# Patient Record
Sex: Female | Born: 1985
Health system: Southern US, Community
[De-identification: ages and names within clinical notes are randomized; demographics above are authoritative.]

## PROBLEM LIST (undated history)

## (undated) DIAGNOSIS — F32A Depression, unspecified: Secondary | ICD-10-CM

## (undated) DIAGNOSIS — J45909 Unspecified asthma, uncomplicated: Secondary | ICD-10-CM

## (undated) DIAGNOSIS — F4310 Post-traumatic stress disorder, unspecified: Secondary | ICD-10-CM

## (undated) DIAGNOSIS — L509 Urticaria, unspecified: Secondary | ICD-10-CM

## (undated) DIAGNOSIS — Z87891 Personal history of nicotine dependence: Secondary | ICD-10-CM

## (undated) DIAGNOSIS — L309 Dermatitis, unspecified: Secondary | ICD-10-CM

## (undated) DIAGNOSIS — F3181 Bipolar II disorder: Secondary | ICD-10-CM

## (undated) DIAGNOSIS — E669 Obesity, unspecified: Secondary | ICD-10-CM

## (undated) DIAGNOSIS — F419 Anxiety disorder, unspecified: Secondary | ICD-10-CM

## (undated) DIAGNOSIS — T7840XA Allergy, unspecified, initial encounter: Secondary | ICD-10-CM

## (undated) DIAGNOSIS — F329 Major depressive disorder, single episode, unspecified: Secondary | ICD-10-CM

## (undated) DIAGNOSIS — F603 Borderline personality disorder: Secondary | ICD-10-CM

## (undated) HISTORY — DX: Dermatitis, unspecified: L30.9

## (undated) HISTORY — DX: Unspecified asthma, uncomplicated: J45.909

## (undated) HISTORY — PX: CYSTECTOMY: SHX5119

## (undated) HISTORY — DX: Borderline personality disorder: F60.3

## (undated) HISTORY — PX: OTHER SURGICAL HISTORY: SHX169

## (undated) HISTORY — DX: Personal history of nicotine dependence: Z87.891

## (undated) HISTORY — DX: Allergy, unspecified, initial encounter: T78.40XA

## (undated) HISTORY — DX: Urticaria, unspecified: L50.9

---
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
## 2007-01-03 ENCOUNTER — Ambulatory Visit (HOSPITAL_COMMUNITY): Payer: Self-pay | Admitting: Psychiatry

## 2007-01-06 ENCOUNTER — Emergency Department (HOSPITAL_COMMUNITY): Admission: EM | Admit: 2007-01-06 | Discharge: 2007-01-06 | Payer: Self-pay | Admitting: Emergency Medicine

## 2007-02-07 ENCOUNTER — Ambulatory Visit (HOSPITAL_COMMUNITY): Payer: Self-pay | Admitting: Psychiatry

## 2007-04-27 ENCOUNTER — Other Ambulatory Visit: Admission: RE | Admit: 2007-04-27 | Discharge: 2007-04-27 | Payer: Self-pay | Admitting: Family Medicine

## 2007-06-17 ENCOUNTER — Ambulatory Visit: Payer: Self-pay | Admitting: Psychiatry

## 2007-06-17 ENCOUNTER — Inpatient Hospital Stay (HOSPITAL_COMMUNITY): Admission: RE | Admit: 2007-06-17 | Discharge: 2007-06-23 | Payer: Self-pay | Admitting: Psychiatry

## 2007-08-05 ENCOUNTER — Encounter: Payer: Self-pay | Admitting: Emergency Medicine

## 2007-08-05 ENCOUNTER — Inpatient Hospital Stay (HOSPITAL_COMMUNITY): Admission: EM | Admit: 2007-08-05 | Discharge: 2007-08-10 | Payer: Self-pay | Admitting: Internal Medicine

## 2007-08-10 ENCOUNTER — Inpatient Hospital Stay (HOSPITAL_COMMUNITY): Admission: RE | Admit: 2007-08-10 | Discharge: 2007-08-15 | Payer: Self-pay | Admitting: Psychiatry

## 2007-08-10 ENCOUNTER — Ambulatory Visit: Payer: Self-pay | Admitting: Psychiatry

## 2008-08-20 ENCOUNTER — Other Ambulatory Visit: Admission: RE | Admit: 2008-08-20 | Discharge: 2008-08-20 | Payer: Self-pay | Admitting: Family Medicine

## 2008-11-18 ENCOUNTER — Inpatient Hospital Stay (HOSPITAL_COMMUNITY): Admission: EM | Admit: 2008-11-18 | Discharge: 2008-11-20 | Payer: Self-pay | Admitting: Emergency Medicine

## 2008-12-31 ENCOUNTER — Emergency Department (HOSPITAL_COMMUNITY): Admission: EM | Admit: 2008-12-31 | Discharge: 2008-12-31 | Payer: Self-pay | Admitting: Emergency Medicine

## 2009-01-01 ENCOUNTER — Emergency Department (HOSPITAL_COMMUNITY): Admission: EM | Admit: 2009-01-01 | Discharge: 2009-01-01 | Payer: Self-pay | Admitting: Emergency Medicine

## 2009-01-21 ENCOUNTER — Emergency Department (HOSPITAL_COMMUNITY): Admission: EM | Admit: 2009-01-21 | Discharge: 2009-01-22 | Payer: Self-pay | Admitting: Emergency Medicine

## 2009-01-22 ENCOUNTER — Ambulatory Visit: Payer: Self-pay | Admitting: Psychiatry

## 2009-01-22 ENCOUNTER — Inpatient Hospital Stay (HOSPITAL_COMMUNITY): Admission: RE | Admit: 2009-01-22 | Discharge: 2009-01-28 | Payer: Self-pay | Admitting: Psychiatry

## 2009-02-24 ENCOUNTER — Emergency Department (HOSPITAL_COMMUNITY): Admission: EM | Admit: 2009-02-24 | Discharge: 2009-02-24 | Payer: Self-pay | Admitting: Emergency Medicine

## 2009-04-19 ENCOUNTER — Emergency Department (HOSPITAL_COMMUNITY): Admission: EM | Admit: 2009-04-19 | Discharge: 2009-04-19 | Payer: Self-pay | Admitting: Emergency Medicine

## 2009-11-20 ENCOUNTER — Emergency Department (HOSPITAL_BASED_OUTPATIENT_CLINIC_OR_DEPARTMENT_OTHER)
Admission: EM | Admit: 2009-11-20 | Discharge: 2009-11-20 | Payer: Self-pay | Source: Home / Self Care | Admitting: Emergency Medicine

## 2010-04-16 LAB — ETHANOL: Alcohol, Ethyl (B): <5 mg/dL (ref 0–10)

## 2010-04-16 LAB — POCT TOXICOLOGY PANEL

## 2010-04-16 LAB — URINALYSIS, ROUTINE W REFLEX MICROSCOPIC
Bilirubin Urine: NEGATIVE
Glucose, UA: NEGATIVE mg/dL
Hgb urine dipstick: NEGATIVE
Ketones, ur: 15 mg/dL — AB

## 2010-04-20 LAB — POCT I-STAT, CHEM 8
BUN: 8 mg/dL (ref 6–23)
Chloride: 108 mEq/L (ref 96–112)
Creatinine, Ser: 0.8 mg/dL (ref 0.4–1.2)
Potassium: 3.8 mEq/L (ref 3.5–5.1)
Sodium: 138 mEq/L (ref 135–145)

## 2010-04-20 LAB — PREGNANCY, URINE: Preg Test, Ur: NEGATIVE

## 2010-04-28 LAB — URINALYSIS, ROUTINE W REFLEX MICROSCOPIC
Glucose, UA: NEGATIVE mg/dL
Hgb urine dipstick: NEGATIVE
Ketones, ur: 15 mg/dL — AB
Protein, ur: 30 mg/dL — AB
Urobilinogen, UA: 1 mg/dL (ref 0.0–1.0)

## 2010-04-28 LAB — RAPID URINE DRUG SCREEN, HOSP PERFORMED
Cocaine: NOT DETECTED
Opiates: NOT DETECTED

## 2010-04-28 LAB — DIFFERENTIAL
Basophils Absolute: 0.1 10*3/uL (ref 0.0–0.1)
Basophils Relative: 1 % (ref 0–1)
Eosinophils Absolute: 0.2 10*3/uL (ref 0.0–0.7)
Neutro Abs: 5.1 10*3/uL (ref 1.7–7.7)
Neutrophils Relative %: 62 % (ref 43–77)

## 2010-04-28 LAB — URINE CULTURE: Colony Count: 5000

## 2010-04-28 LAB — BASIC METABOLIC PANEL
BUN: 5 mg/dL — ABNORMAL LOW (ref 6–23)
Chloride: 107 mEq/L (ref 96–112)
GFR calc Af Amer: >60 mL/min (ref >60)
GFR calc non Af Amer: >60 mL/min (ref >60)
Potassium: 3 mEq/L — ABNORMAL LOW (ref 3.5–5.1)
Sodium: 138 mEq/L (ref 135–145)

## 2010-04-28 LAB — URINE MICROSCOPIC-ADD ON

## 2010-04-28 LAB — CBC
MCHC: 33.4 g/dL (ref 30.0–36.0)
MCV: 89.7 fL (ref 78.0–100.0)
RDW: 12 % (ref 11.5–15.5)

## 2010-05-05 LAB — CBC
HCT: 43.7 % (ref 36.0–46.0)
Hemoglobin: 14.9 g/dL (ref 12.0–15.0)
MCHC: 34.1 g/dL (ref 30.0–36.0)
RBC: 4.82 MIL/uL (ref 3.87–5.11)

## 2010-05-05 LAB — BASIC METABOLIC PANEL
CO2: 22 mEq/L (ref 19–32)
Calcium: 9.4 mg/dL (ref 8.4–10.5)
GFR calc Af Amer: >60 mL/min (ref >60)
Glucose, Bld: 93 mg/dL (ref 70–99)
Potassium: 3.7 mEq/L (ref 3.5–5.1)
Sodium: 139 mEq/L (ref 135–145)

## 2010-05-05 LAB — URINE CULTURE
Colony Count: NO GROWTH
Special Requests: NEGATIVE

## 2010-05-05 LAB — URINE MICROSCOPIC-ADD ON

## 2010-05-05 LAB — URINALYSIS, ROUTINE W REFLEX MICROSCOPIC
Ketones, ur: >80 mg/dL — AB
Nitrite: NEGATIVE
Protein, ur: NEGATIVE mg/dL
Urobilinogen, UA: 1 mg/dL (ref 0.0–1.0)

## 2010-05-05 LAB — RAPID URINE DRUG SCREEN, HOSP PERFORMED
Amphetamines: NOT DETECTED
Barbiturates: NOT DETECTED
Benzodiazepines: POSITIVE — AB
Tetrahydrocannabinol: POSITIVE — AB

## 2010-05-05 LAB — DIFFERENTIAL
Basophils Relative: 1 % (ref 0–1)
Eosinophils Relative: 1 % (ref 0–5)
Lymphocytes Relative: 25 % (ref 12–46)
Monocytes Absolute: 0.4 10*3/uL (ref 0.1–1.0)
Monocytes Relative: 4 % (ref 3–12)
Neutro Abs: 7 10*3/uL (ref 1.7–7.7)

## 2010-05-05 LAB — TRICYCLICS SCREEN, URINE: TCA Scrn: NOT DETECTED

## 2010-05-07 LAB — CBC
HCT: 42 % (ref 36.0–46.0)
Hemoglobin: 14.5 g/dL (ref 12.0–15.0)
MCHC: 34.5 g/dL (ref 30.0–36.0)
RDW: 12.1 % (ref 11.5–15.5)

## 2010-05-07 LAB — BASIC METABOLIC PANEL
BUN: 5 mg/dL — ABNORMAL LOW (ref 6–23)
CO2: 22 mEq/L (ref 19–32)
Glucose, Bld: 110 mg/dL — ABNORMAL HIGH (ref 70–99)
Potassium: 3.1 mEq/L — ABNORMAL LOW (ref 3.5–5.1)
Sodium: 140 mEq/L (ref 135–145)

## 2010-05-07 LAB — DIFFERENTIAL
Basophils Absolute: 0.1 10*3/uL (ref 0.0–0.1)
Basophils Relative: 1 % (ref 0–1)
Eosinophils Absolute: 0.4 10*3/uL (ref 0.0–0.7)
Eosinophils Relative: 4 % (ref 0–5)
Monocytes Absolute: 0.6 10*3/uL (ref 0.1–1.0)

## 2010-05-08 LAB — DIFFERENTIAL
Basophils Absolute: 0.1 10*3/uL (ref 0.0–0.1)
Eosinophils Relative: 2 % (ref 0–5)
Lymphocytes Relative: 12 % (ref 12–46)
Neutro Abs: 10.4 10*3/uL — ABNORMAL HIGH (ref 1.7–7.7)
Neutrophils Relative %: 82 % — ABNORMAL HIGH (ref 43–77)

## 2010-05-08 LAB — POCT I-STAT, CHEM 8
Calcium, Ion: 1.04 mmol/L — ABNORMAL LOW (ref 1.12–1.32)
Chloride: 110 mEq/L (ref 96–112)
Creatinine, Ser: 0.6 mg/dL (ref 0.4–1.2)
Glucose, Bld: 138 mg/dL — ABNORMAL HIGH (ref 70–99)
HCT: 41 % (ref 36.0–46.0)
Hemoglobin: 13.9 g/dL (ref 12.0–15.0)
Potassium: 3.7 mEq/L (ref 3.5–5.1)
Sodium: 141 mEq/L (ref 135–145)

## 2010-05-08 LAB — CBC
Platelets: 213 10*3/uL (ref 150–400)
RDW: 12.1 % (ref 11.5–15.5)

## 2010-05-08 LAB — URINALYSIS, ROUTINE W REFLEX MICROSCOPIC
Bilirubin Urine: NEGATIVE
Hgb urine dipstick: NEGATIVE
Ketones, ur: NEGATIVE mg/dL
Nitrite: NEGATIVE
Specific Gravity, Urine: 1.018 (ref 1.005–1.030)
pH: 7 (ref 5.0–8.0)

## 2010-05-08 LAB — RAPID URINE DRUG SCREEN, HOSP PERFORMED
Amphetamines: NOT DETECTED
Barbiturates: NOT DETECTED
Benzodiazepines: POSITIVE — AB
Opiates: POSITIVE — AB

## 2010-06-17 NOTE — Consult Note (Signed)
NAMEPHILIS, DOKE               ACCOUNT NO.:  [id number]   MEDICAL RECORD NO.:  [id number]          PATIENT TYPE:  INP   LOCATION:  5503                         FACILITY:  [facility]   PHYSICIAN:  Antonietta Breach, M.D.  DATE OF BIRTH:  07-13-1985   DATE OF CONSULTATION:  08/08/2007  DATE OF DISCHARGE:                                 CONSULTATION   REQUESTING PHYSICIAN:  [facility]   REASON FOR CONSULTATION:  Heroin overdose.   HISTORY OF PRESENT ILLNESS:  Mrs. Fronnie Urton is a 25 year old female  admitted to the [facility] on August 05, 2007 due to overdose.   The patient just recently was discharged from [facility] and immediately relapsed on heroin.  She has been having acute  depressed mood with thoughts of hopelessness and helplessness. Her  boyfriend also overdosed on IV heroin and he may end up dying. He is now  in an intensive care unit.   The patient is at high risk for relapse.  She also cannot perform her  activities of daily living due to her severe distress and depressed  mood. She he is not having any hallucinations or delusions.  She is  cooperative with care.   PAST PSYCHIATRIC HISTORY:  The patient was admitted to the [facility] in September 2004. At that time, she was  diagnosed with depression and anxiety as well as an eating disorder.  She was treated with Effexor.   In September 2008, the patient was also admitted to the [facility]. At that time, she was treated for mood disorder not  otherwise specified and was given Effexor 300 mg q. day.   Her next admission to the [facility] was in May of  this year. She was at that time planning to overdose on medication and  was suffering from depression.   The patient has a history of cutting. She has been followed by the  [facility] in the past.  Her other psychotropic trials include a  history of Wellbutrin as well as  Prozac.   FAMILY PSYCHIATRIC HISTORY:  None known.   SOCIAL HISTORY:  The patient has no children.  She is single. Please see  the above.  She has a history of using 60-70 dollars worth of heroin per  day.   PAST MEDICAL HISTORY:  Chronic back pain status post heroin overdose.   MEDICATIONS:  MAR is reviewed.  The patient is on Elavil 25 mg nightly,  buspirone 75 mg b.i.d., Librium 25 mg b.i.d., Celexa 10 mg daily. She is  also on Effexor 75 mg b.i.d. and Neurontin 300 mg daily.   ALLERGIES:  She has NO KNOWN DRUG ALLERGIES.   LABORATORY DATA:  TSH within normal limits.  Urine pregnancy test  negative.  Aspirin negative.  Tylenol negative. Alcohol negative.  Sodium 142, BUN 14, creatinine 0.8, glucose 150, SGOT 24, SGPT 14, WBC  7.8, hemoglobin 12.2, platelet count 190.   REVIEW OF SYSTEMS:  Constitutional, head, eyes, ears, nose and throat,  mouth, neurologic, psychiatric, cardiovascular,  respiratory,  gastrointestinal, genitourinary, skin, musculoskeletal, hematologic,  lymphatic, endocrine, metabolic are all unremarkable.   EXAMINATION:  VITAL SIGNS:  Stable.  GENERAL APPEARANCE:  Ms. Laidlaw is a young female partially reclined  in a supine position in her hospital bed with no abnormal involuntary  movements.   MENTAL STATUS EXAM:  Ms. Muffley is alert.  Her eye contact is within  normal limits.  Her attention span is mildly decreased.  Her affect is  constricted with sobbing. Her mood is depressed.  She is oriented to all  spheres.  Her memory is intact to immediate, recent and remote except  for the overdose blackout. Her fund of knowledge and intelligence are  within normal limits.  Her speech is soft.  There is no dysarthria.  Thought process is logical, coherent, goal-directed.  Thought content-  thoughts of hopelessness and helplessness, no hallucinations or  delusions.  Insight is partial. Judgment is intact for the need of  treatment.   ASSESSMENT:  AXIS I:   1.)  293.83 Mood disorder not otherwise specified.  2.)  Rule out major depressive disorder, recurrent, severe.  3.)  Opioid dependence.  AXIS II:  Deferred.  AXIS III:  Status post heroin overdose, chronic back pain.  AXIS IV:  Primary support group general medical.  AXIS V:  30.   Ms. Musgrave continues with a depression that has not remitted. During  this depression, in her most recent assessment at [facility], she mentioned a planned overdose.  She is now in the hospital  for an overdose.  She has not been able to abstain from heroin having  just relapsed after coming out of a rehab center.   Therefore, the undersigned recommends that the patient be readmitted to  an inpatient psychiatric unit for further evaluation and treatment.   Would continue the sitter.   The patient has not been on Effexor.  She states that it was not  effective.  She is on a trial of Celexa. Therefore would taper off of  the Effexor and increase her Celexa 20 mg q. a.m. with further increase  anticipated as she is evaluated on a psychiatric unit.   Admit to a psychiatric unit for further evaluation and treatment as soon  as possible.   The undersigned provided ego supportive psychotherapy and education  including review of the indications, alternatives and adverse effects of  the patient's medication.   Would try to taper off the Librium as soon as possible. Her Neurontin  can be increased to 300 mg t.i.d. as an adjunct to help anxiety. The  BuSpar will not be discontinued at this time. It can be used as an  adjunct with Celexa to treat anxiety and depression.  However, caution  must be utilized since BuSpar is a serotonin agonist and Celexa is a  serotonin reuptake inhibitor.   Would not increase Elavil above 50 mg nightly for pain treatment given  that it also had a serotonin reuptake inhibition.      Antonietta Breach, M.D.  Electronically Signed     JW/MEDQ  D:  08/08/2007   T:  08/08/2007  Job:  [id number]

## 2010-06-17 NOTE — H&P (Signed)
NAMEKELLEY, POLINSKY               ACCOUNT NO.:  [id number]   MEDICAL RECORD NO.:  [id number]          PATIENT TYPE:  INP   LOCATION:  2111                         FACILITY:  [facility]   PHYSICIAN:  Mobolaji B. Bakare, M.D.DATE OF BIRTH:  Nov 08, 1985   DATE OF ADMISSION:  08/05/2007  DATE OF DISCHARGE:                              HISTORY & PHYSICAL   PRIMARY CARE PHYSICIAN:  Unassigned.   CHIEF COMPLAINT:  Overdose on heroin.   HISTORY OF PRESENTING COMPLAINT:  Ms. Gayle is a 25 year old  Caucasian female who was recently hospitalized voluntarily at [facility] for opiate and heroin detoxification.  She was discharged  on the 21st of May 2009.  She subsequently went to [facility] for  inpatient opiate rehabilitation.  The patient was discharged from rehab  yesterday.  She was found by EMS to being in agonal breathing, sometime  this afternoon after the patient had used heroin intravenously.  She  received 1 mg of Narcan.  Her breathing improved.  She was taken to  [facility] emergency room in [facility].  On initial evaluation,  respiratory rate was 11 to 14.  Blood pressure was normal, 127/75, heart  rate 84 to 144.  The patient was given another dose of Narcan, and she  was transferred over to ICU in [facility].   The patient is currently awake and able to give history.  She tells me  that she wanted to feel good.  Hence, she decided to use IV heroin.  She  denied suicidal attempts.  She has never attempted suicide in the past.  She denies any other drug abuse.  No alcohol intake.  She stated that  she used the same amount of heroin that she was using before she went  into rehab.   REVIEW OF SYSTEMS:  She denies headaches, changes in her vision,  unilateral weakness.  No nausea, vomiting, diarrhea.  No abdominal  cramps.  No dysuria, urgency or increased frequency of micturition.   PAST MEDICAL HISTORY:  1. Anxiety.  2. Chronic back pain.  3. Depression.  4.  History of __________.  5. History of post-traumatic stress disorder.  6. History of sleep disorder.   CURRENT MEDICATIONS:  BuSpar, Celexa, Elavil, Minipress, Neurontin,  Robaxin, birth control pills; dosages unknown at this time.   ALLERGIES:  No known drug allergies.   FAMILY HISTORY:  Mother lives in [place], lives in [place].  No  known health issues with both parents.   SOCIAL HISTORY:  The patient stays with her boyfriend and sometimes  lives in a car.  She does not smoke cigarettes or drink alcohol.  She  uses heroin.   CURRENT VITALS:  Temperature 98.2, blood pressure 131/82, respiratory  rate 10 to 12, O2 saturations 100% on room air.  Pulse 118-120.   On examination, the patient is awake, alert, oriented to time, place and  person.  Normocephalic, atraumatic.  Pupils equal, round and reactive to light.  Pupils are 6 mm and reactive to light.  No elevated JVD.  No carotid bruit.  LUNGS:  Clear clinically  to auscultation.  CARDIOVASCULAR SYSTEM:  S1-S2 regular.  No murmur or gallop.  ABDOMEN:  Nondistended, soft, nontender.  Bowel sounds present.  EXTREMITIES:  No pedal edema or calf tenderness.  Dorsalis pedis pulses  palpable bilaterally.  CENTRAL NERVOUS SYSTEM:  No focal neurological deficit.   INITIAL LABORATORY DATA:  Urinalysis negative for nitrite, leukocyte  esterase, specific gravity 1.022.  Urine pregnancy test negative.  Salicylate level less than 1.  Acetaminophen level less than 10.  Alcohol level less than 5.  Sodium 142, potassium 3.8, chloride 112, CO2  23, glucose 150, BUN 14, creatinine 8.  Bilirubin 0.4, AST 24, ALT 14,  calcium 7.9.  Albumin 3.8, total protein 6.3.  White cells 7.8,  hemoglobin 12.2, hematocrit 35.9, platelets 190, normal differential.   Chest x-ray:  No acute cardiopulmonary disease.   ASSESSMENT AND PLAN:  Ms. Mcclory is a 25 year old Caucasian female  with history of opiate abuse and dependence.  She recently left  rehab  yesterday but wanted to feel good sometime today and decided to take  normal dose of heroin intravenously.  She was found unresponsive with  agonal breathing by EMS.  She received Narcan and was taken to the  emergency room for further evaluation.  She is currently awake and  oriented and clinically stable.  She will be monitored overnight and  evaluated further.   ADMISSION DIAGNOSIS:  1. Acute opiate overdose (heroin).  Will admit to ICU.  Vitals checked      per unit protocol and neuro check q.2h.  Will give supportive care      as necessary.  Narcan 0.2 to 0.4 mg IV p.r.n.  2. Sinus tachycardia.  Likely related to current illness.  I would      expect sinus bradycardia with opiate overdose.  Will monitor      closely.  The patient does not appear to be in withdrawal at this      point.  Will check a 12-lead EKG.  3. History of opiate dependence.  The patient states that she is      quitting.  Will asked psychiatry to reevaluate again.  4. History of post-traumatic stress disorder.      Mobolaji B. Corky Downs, M.D.  Electronically Signed     MBB/MEDQ  D:  08/05/2007  T:  08/05/2007  Job:  [id number]

## 2010-06-17 NOTE — Discharge Summary (Signed)
[name], [name]                   ACCOUNT NO.:  [id number]   MEDICAL RECORD NO.:  [id number]          PATIENT TYPE:  INP   LOCATION:  2111                         FACILITY:  [facility]   PHYSICIAN:  Herbie Saxon, MDDATE OF BIRTH:  [birth year]   DATE OF ADMISSION:  08/05/2007  DATE OF DISCHARGE:                               DISCHARGE SUMMARY   DISCHARGE DIAGNOSES:  1. Heroin overdose.  2. Altered mental status,, resolved.  3. History of depression.  4. History of suicide attempt.  5. Sinus tachycardia.  6. Drug withdrawal.  7. History of marijuana, benzodiazepine, and tobacco abuse.   HOSPITAL COURSE:  This 25 year old Caucasian lady who was recently  discharged voluntarily to [facility] for opioid and heroin  detoxification, was discharged on Jun 23, 2007.  Subsequently, went to  [facility] for inpatient opioid rehab.  She was discharged a day  before this present admission.  The patient promptly went back to using  heroin intravenously, brought to the hospital emergency room at [facility] given Narcan x2 and subsequently transferred to the ICU at [facility].  The patient denied any suicidal ideation at this admission.  The  patient denies being transposed and generally feel depressed.  The  patient's Elavil was held and Effexor was to be started, but she was  declining the Effexor.  Psychiatric  evaluation has been called, with  psychiatric  recommendation for possible inpatient psych and detox  readmission.   RADIOLOGY:  The chest x-ray on admission showed no acute disease.   PHYSICAL EXAMINATION:  VITAL SIGNS:  On examination today, temperature  99.0, pulse 74, respiratory rate is 21, blood pressure 132/74.  HEENT:  Pupils are equal reactive to light and accommodation.  Head is  atraumatic and normocephalic.  Mucous membranes are moist.  NECK:  Supple.  CHEST:  Clinically clear.  HEART:  S1 and S2, regular rhythm.  ABDOMEN:  Benign.  NEUROLOGIC:   She is alert and oriented in time, place, and person.  Cranial nerves II through XII intact.  Power is 5 globally.  Deep tendon  reflexes 2 globally.  Peripheral pulses present.  No pedal edema.   RESULTS:  Thyroid function test was normal.  Urinalysis negative.  WBC  7, hematocrit 35.9, platelet count 119.  Chemistry, sodium 142,  potassium 3.2, chloride 112, bicarbonate 23, glucose 150, BUN 14, and  creatinine 0.8.   PLAN:  Get case management involved in coordinating the subsequent  disposition to preferably inpatient Detox Rehab Facility.  The patient  also to be given supportive psychotherapy and psychiatrist to coordinate  the psychotropic medications.      Herbie Saxon, MD  Electronically Signed     MIO/MEDQ  D:  08/08/2007  T:  08/08/2007  Job:  [id number]

## 2010-06-17 NOTE — H&P (Signed)
NAMESAYDA, GRABLE               ACCOUNT NO.:  [id number]   MEDICAL RECORD NO.:  [id number]          PATIENT TYPE:  IPS   LOCATION:  0300                          FACILITY:  [facility]   PHYSICIAN:  Jasmine Pang, M.D. DATE OF BIRTH:  [birth year]   DATE OF ADMISSION:  10/14/2006  DATE OF DISCHARGE:                       PSYCHIATRIC ADMISSION ASSESSMENT   IDENTIFYING INFORMATION:  This is an 25 year old white female who is  single.  This is a voluntary admission.   HISTORY OF PRESENT ILLNESS:  This 25 year old presented as a walk-in to  our clinic complaining of suicidal thoughts, tearful, saying that she  could no longer keep herself happy, sobbing, felt that she could not be  safe at home.  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  She denies any  psychosocial stressors.  Endorses use of marijuana 1-2 times a day for  the past six months.  Has used Xanax and Valium on occasion to help calm  herself down, last use two months ago.  has used cocaine one time this  summer, last two months ago.  Denies any abuse of alcohol.  Reports  compliance with her Effexor 150 mg daily prescribed by Dr. Lolly Mustache and  has not taken the Lamictal which he prescribed for her about three weeks  ago.  She has lost hope in the Effexor, taking care of her symptoms and  reports being frightened of the Lamictal, fears that she will have  withdrawal symptoms if she misses a day.   PAST PSYCHIATRIC HISTORY:  This is the patient's second admission to  [facility] with her last admission being  October 17, 2002 to October 23, 2002 with Dr. Beverly Milch on the  adolescent unit at [facility].  She has a history of eating disorder with  purging and restricting her diet.  Has a history  of general anxiety  disorder and dependent and borderline personality features.  She has  been seen in the past by Dr. Cari Caraway and is not currently  seeing a psychotherapist.  Her current outpatient psychiatrist is Dr.  Kathryne Sharper at [facility] outpatient.  She was treated with Prozac until her  admission in 2004 when she was changed to Effexor.  She reported good  results from the Prozac with initial efficacy, then lost confidence in  it.  She also had reported initial efficacy with the Effexor which she  reports having taken consistently since 2004.  She has a history of self-  mutilation with cutting and suicidal thoughts.  No history of  homicidality or psychosis.   SOCIAL HISTORY:  This is an 25 year old white female, single, never  married.  No children.  Currently living with her boyfriend and his  father in [place].  She receives financial support from her father's  child support payments.  She denies any psychosocial stressors.   PRIMARY CARE PHYSICIAN:  The patient is followed by Dr. Hyacinth Meeker at [facility].   MEDICAL PROBLEMS:  Bronchitis.   CURRENT MEDICATIONS:  Albuterol inhaler which was prescribed this week,  Effexor XR 150 mg p.o. b.i.d. and the patient was prescribed a Lamictal  starter pack by Dr. Lolly Mustache which she has not taken.  Also previously was  on Klonopin, dose unknown, which she discontinued about three weeks ago.   POSITIVE PHYSICAL FINDINGS:  The patient's full physical exam is done  here along with review of systems and is noted in the record.  Well-  nourished, well-developed, petite-built female who is tearful and a bit  disheveled.  Height 5 feet 2 inches tall, weight 54.9 kg.  Her vital  signs were within normal limits.   LABORATORY DATA:  Diagnostic studies were remarkable for ketones 40  mg/dL in her urine, urine cloudy, many bacteria and large leukocyte  esterase.  Urine drug screen was positive for tetrahydrocannabinoids.   Alcohol level was negative.  CBC revealed WBC 11.5, hemoglobin 13.6,  hematocrit 39.6, platelets 348,000.  Chemistries revealed sodium 140,  potassium 3.7, chloride 105, carbon dioxide 26, BUN 4, creatinine 0.85  and random glucose 99.  Liver enzymes mildly elevated with SGOT 40, SGPT  37, alkaline phosphatase 64 and total bilirubin 0.7.   MENTAL STATUS EXAM:  Mildly disheveled white female, petite build, is  calm and cooperative, in with groups and peers, readily bursts into  tears with attention.  Cooperates with direction, is polite.  Speech  normal in pace, tone and production.  A rather poor historian due to her  crying and needed to be redirected but accepting of redirection during  interview.  Mood is depressed and anxious.  Thought process is logical,  feeling slightly agitated, some motor restlessness in interview.  Insight poor, impulse control poor.  No evidence of homicidal thought,  generally feeling unsafe due to her anxiety.  Cognition is intact.  She  is suicidal but not homicidal, expressing a lot of negative thoughts  about how hopeless she feels.  Has no confidence in the medications.   DIAGNOSES:  AXIS I:  Depressive disorder not otherwise specified.  AXIS II:  Deferred.  AXIS III:  Bronchitis not otherwise specified, leukocytosis not  otherwise specified and transaminitis.  AXIS IV:  Deferred.  AXIS V:  Current 30; past year 80, estimated.   PLAN:  To voluntarily admit the patient to stabilize her mood and her  anxiety.  We are going to give her Ativan 0.5 mg now and q.4h. p.r.n. to  get her stabilized.  At this point, we will continue her Effexor 150 mg  b.i.d. and have placed a call to Dr. Lolly Mustache, her primary care  psychiatrist, to coordinate care.  We will try to get in touch with her  family with her consent and will consider restarting her Lamictal when  she is able to have a better discussion with [place] about treatment  programming.  Meanwhile, she is placed in  dual diagnosis programming.   ESTIMATED LENGTH OF STAY:  Five days.      Margaret A. Lorin Picket, N.P.      Jasmine Pang, M.D.  Electronically Signed    MAS/MEDQ  D:  10/15/2006  T:  10/15/2006  Job:  [id number]
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
## 2010-06-17 NOTE — Discharge Summary (Signed)
[name], [name]               ACCOUNT NO.:  [id number]   MEDICAL RECORD NO.:  [id number]          PATIENT TYPE:  IPS   LOCATION:  0305                          FACILITY:  [facility]   PHYSICIAN:  Beckey Rutter, MD  DATE OF BIRTH:  15-Jun-1985   DATE OF ADMISSION:  08/10/2007  DATE OF DISCHARGE:                               DISCHARGE SUMMARY   PRIMARY CARE PHYSICIAN:  Unassigned.   Please refer to the previously dictated discharge summary for diagnosis  and hospital course.   DISCHARGE MEDICATIONS:  1. Librium, taper off dose.  2. Celexa 30 mg daily.  3. Ativan 1 to 2 mg IV p.o. q.6 h. p.r.n.   The patient still very tearful.  She had the news that her boyfriend has  died with some heroin overdose as well.  The discharge plan now is to be  transferred to [facility] for further psych management.  The  patient medically stable.  Discharge plan discussed with her and her  aunt in the room.      Beckey Rutter, MD  Electronically Signed     EME/MEDQ  D:  08/10/2007  T:  08/11/2007  Job:  [id number]

## 2010-06-17 NOTE — H&P (Signed)
[name], [name]               ACCOUNT NO.:  [id number]   MEDICAL RECORD NO.:  [id number]          PATIENT TYPE:  IPS   LOCATION:  0504                          FACILITY:  [facility]   PHYSICIAN:  Geoffery Lyons, M.D.      DATE OF BIRTH:  1985/04/06   DATE OF ADMISSION:  06/17/2007  DATE OF DISCHARGE:                       PSYCHIATRIC ADMISSION ASSESSMENT   A 25 year old female voluntarily admitted Jun 17, 2007. per Geoffery Lyons,  M.D.   HISTORY OF PRESENT ILLNESS:  The patient presents here on a voluntary  basis.  She states she has been wanting to die for 1 week, feeling very  depressed.  Her plan was to overdose.  States that she did overdose  about a week ago but it was not an attempt to try to kill herself.  She has been using heroin, using intravenously up to 60-70 dollars on a  daily basis.  Also using other substances - cocaine, marijuana and some  occasional use of alcohol.  She has not been sleeping well.  Her  appetite has been satisfactory.  She states she is currently homeless,  was living with an ex-boyfriend, having problems with that relationship,  has other stressors such as being unemployed and has court dates  upcoming.   PAST PSYCHIATRIC HISTORY:  The patient was here last summer for anxiety  and depression.  She sees Dr. Mila Homer at the [facility] and has Hal Neer, who is her therapist.  The patient has a history of cutting.  She has been doing some recent cutting, cutting both sides of her hips.   SOCIAL HISTORY:  A 25 year old single female, has no children.  She  lives here in [place], [place].  Considers self homeless at  this time, was living with her ex-boyfriend for some period of time, is  unemployed.  Has a court date for shoplifting charges.   FAMILY HISTORY:  None.   ALCOHOL AND DRUG HISTORY:  Patient smokes.  Again, has been using heroin  and other substances - marijuana, cocaine and occasional alcohol use.   PRIMARY CARE [place]:   Dr. Hyacinth Meeker in [place], [place].   MEDICAL PROBLEMS:  Denies any acute or chronic health issues.   MEDICATIONS:  Has been on Neurontin and Wellbutrin and prescribed  Prozac, but has been noncompliant for at least a month.   DRUG ALLERGIES:  No known allergies.   REVIEW OF SYSTEMS:  Significant for insomnia, decreased appetite.  No  fever nor chills.  No chest pain or shortness of breath.  No nausea,  vomiting, dysuria.  Positive for muscle aches.  No seizures.  No  headaches.   PHYSICAL EXAMINATION:  VITAL SIGNS:  Temperature is 97.7, 72 heart rate,  20 respirations, blood pressure is 86/58.  She is 106 pounds, 5 feet 2  inches tall.  GENERAL APPEARANCE:  This is a young well-nourished female complaining  of some withdrawal symptoms, some muscle aches and feeling tired.  HEENT:  Her head is atraumatic.  Sclerae is clear.  NECK:  Neck is supple.  Negative lymphadenopathy.  Chest is clear with no wheezing  or rales auscultated.  BREASTS:  Breast exam deferred.  HEART:  Regular rate and rhythm.  No murmurs or gallops were  auscultated.  ABDOMEN:  Soft, nondistended, nontender abdomen.  PELVIC AND GU:  Exam was deferred.  EXTREMITIES:  Moves all extremities.  No clubbing or edema.  No  deformities.  SKIN:  The patient has multiple healed superficial lacerations to both  her hips.  She has a piercing to her lip.  She has a bruise  approximately dime-size to her left inner arm noted.  Skin otherwise is  fair skinned, no rashes were noted.  NEUROLOGIC:  Findings are intact and nonfocal.   LABORATORY DATA:  CBC is within normal limits.  TSH is 2.080.  Glucose  of 62.  Pregnancy test is negative.  Urinalysis shows moderate  leukocytes with WBC of 3-6.   MENTAL STATUS EXAM:  The patient at this time is sleepy, but she did  arouse and was cooperating with the interview.  Speech is clear, soft  spoken.  The patient's mood is depressed and hopeless.  The patient does  appear flat  and depressed.  Thought processes are coherent, goal  directed.  Cognitive function intact.  Her memory is good.  Judgment is  poor.  Insight is partial.   AXIS I:  1. Opiate dependence.  2. Polysubstance abuse.  3. Depressive disorder, not otherwise specified.  AXIS II:  Deferred.  AXIS III:  No known medical conditions.  AXIS IV:  Problems with occupation, housing, legal system and other  psychosocial problems.  AXIS V:  Current is 35-40.   PLAN:  Contract for safety.  We will detox with the clonidine protocol.  The patient will be in the red group.  Will work on relapse prevention.  Will continue to assess her support group and identify her support.  Case manager will assess her living arrangements and obtain her follow-  up.  The patient will need to continue with her therapy appointments.   TENTATIVE LENGTH OF STAY:  4-5 days or more depending if patient may  benefit from some long-term rehab.      Landry Corporal, N.P.      Geoffery Lyons, M.D.  Electronically Signed    JO/MEDQ  D:  06/22/2007  T:  06/22/2007  Job:  [id number]

## 2010-06-17 NOTE — Consult Note (Signed)
NAMEJONIYAH, [name]               ACCOUNT NO.:  [id number]   MEDICAL RECORD NO.:  [id number]          PATIENT TYPE:  IPS   LOCATION:  0305                          FACILITY:  [facility]   PHYSICIAN:  Antonietta Breach, M.D.  DATE OF BIRTH:  [birth year]   DATE OF CONSULTATION:  08/10/2007  DATE OF DISCHARGE:                                 CONSULTATION   Ms. [name] is suffering from severe depressed mood.  She cannot stop  crying.  She is rocking back and forth in her hospital bed.  She has no  hallucinations or delusions.  However, she states that she just wants to  leave this world and has thoughts of overdosing if she leaves the  hospital.   The patient went down to see her deceased boyfriend's body under escort  as part of her grieving process.   REVIEW OF SYSTEMS:  GASTROINTESTINAL:  No nausea.   EXAMINATION:  VITAL SIGNS:  Temperature 98.1, pulse 82, respiratory rate  20, blood pressure 115/74.  O2 saturation on room air 100%.   MENTAL STATUS EXAM:  Ms. [name] is alert.  Her eye contact is  intermittent.  Her attention span is mildly decreased.  Concentration is  mildly decreased.  Her affect is very constricted with tears.  Her mood  is anxious.  She is rocking back and forth in her bed.  She is oriented  to all spheres.  Her memory function is intact.  Speech is pressured at times.  There is no dysarthria.  Thought processs is logical coherent, goal directed.  No looseness of  association.  Thought content:  Please see the history above.  The patient has no  hallucinations or delusions.  Her insight is intact per her depression.  Her judgment is intact for her need of further psychiatric evaluation  and treatment.   ASSESSMENT:  293.84 Anxiety disorder, not otherwise specified.  293.83  Mood disorder, not otherwise specified.  Rule out 296.33, major  depressive disorder, recurrent, severe.   RECOMMENDATIONS:  1. Would admit this patient to an inpatient psychiatric unit as  soon      as possible.  2. Would taper off of her Lithium and continue Ativan 1 to 2 mg every      6  hours p.r.n. anxiety.  3. Would increase her Celexa to 30 mg daily for anti-depression,      antianxiety.   The undersigned provided ego supportive psychotherapy.   Total time of visit and dictation:  25 minutes.      Antonietta Breach, M.D.  Electronically Signed     JW/MEDQ  D:  08/10/2007  T:  08/10/2007  Job:  [id number]

## 2010-06-20 NOTE — Discharge Summary (Signed)
[name], [name]               ACCOUNT NO.:  [id number]   MEDICAL RECORD NO.:  [id number]          PATIENT TYPE:  IPS   LOCATION:  0302                          FACILITY:  [facility]   PHYSICIAN:  Geoffery Lyons, M.D.      DATE OF BIRTH:  Jul 12, 1985   DATE OF ADMISSION:  08/10/2007  DATE OF DISCHARGE:  08/15/2007                               DISCHARGE SUMMARY   CHIEF COMPLAINT/PRESENT ILLNESS:  This was the 4th admission to [facility] for this 25 year old female history of opiate  dependence.  Last admitted July 3-July 8 was in [facility] for  opiate rehab.  Initially after she left [facility] she  relapsed.  She was admitted to [facility] in [place] for detox.  She was there  with who became her boyfriend.  She was transferred to [facility]  after they found out that they were in a relationship.  She completed  her treatment in [facility]  He completed his treatment in [place].  She was released on a Thursday, she relapsed on a Friday on heroin.  She  overdosed on it, claimed that she was not really wanting to kill herself  but that she was not aware of the strength, the purity of the heroin she  was using.  She was admitted to the medical unit.  While in the medical  unit the boyfriend visited.  He was also using so he was in active  addiction.  Endorsed that the next thing she knows is that the boyfriend  was admitted with an overdose of heroin to the ICU.  He died and she  could not say good-bye.  She later went to the morgue to see the corpse,  says he all swollen and endorsed that she was still with the image of  his face.  Continued to have a hard time, she did not want to come back  to Behavior Health but she claims she was told that she could bring the  teddy bear home mate that boyfriend gave to her.  When told that she  could not keep it, she threatened to act out.  She eventually gave it up  when realized that we would not change policy  for her.   PAST PSYCHIATRIC HISTORY:  As already stated [facility]  3-4 times, last time [facility] to [facility], then overdosed,  then to [facility].  Claimed she did really well on the Celexa and  Neurontin, Robaxin, BuSpar, Elavil and Minipress for the nightmares.   MEDICAL HISTORY:  Noncontributory other than status post heroin  overdose.   LABORATORY WORK:  They were not repeated given the fact that she had an  extensive workup when she was in the medical unit.   PHYSICAL EXAM:  Performed at the medical unit.  Exam revealed an alert cooperative female.  Mood, anxiety and depression  affect depressed.  Tearful when talking about the death of the  boyfriend, very upset, tearful, overwhelmed with a grief, shame, guilt,  wanting to go to a longer-term treatment  throughout the 2 years,  actively grieving the loss of the boyfriend.  Denied suicidal, homicidal  ideations.  No delusions.  No hallucinations.  Cognition well-preserved.   ADMITTING DIAGNOSES:  Axis I:  Major depressive disorder, acute grief,  opiate dependence.  Axis II:  No diagnosis.  Axis III:  Status post overdose.  Axis IV:  Moderate.  Axis V:  Upon admission 35 GAF, discharge 60.   COURSE IN THE HOSPITAL:  She was admitted.  She was started individual  and group psychotherapy.  She was placed on some Librium as needed for  possibility of some other withdrawal.  She was placed on BuSpar 7.5  twice a day, Celexa 20 mg per day, Neurontin 300 in the morning which  was eventually placed at Neurontin 300 mg 4 times a day.  She continued  to be tearful talking about the boyfriend death secondary to the  overdose.  Initially very motivated to go to a long-term program.  Claimed that she had a lot of anger got drugs and was some much in  love with him.  The next 48 hours she continued to act out some.  She  rapidly got involved with some female patient's in the unit.  July 13  continued to  exhibit the same behavior that she exhibited the last time  she was admitted.  She was involved in somewhat dysfunctional  manipulative relationship with other young males like the last time,  focusing that she wanted to be discharged, that she was going to be  fine, did not want to pursue the long-term treatment that she claimed  she wanted to pursue when she came in.  After she said that her mother  was going to be part of her life, she then said that the mother was not  going to be part of her life.  She denied any active suicidal, homicidal  idea, no hallucinations or delusions.  Claims she was committed to long-  term sobriety.  The thought was that she had already hooked up with  another female in the unit and that she was going to get with him once  discharged.   DISCHARGE DIAGNOSES:  Axis I:  Opiate dependence, mood disorder NOS.  Axis II:  Personality disorder NOS.  Axis III:  Status post overdose.  Axis IV:  Moderate  Axis V:  Upon discharge 50.   Discharged on:  1. Neurontin 300 4 times a day.  2. BuSpar 7.5 twice a day.  3. Celexa 20 mg per day.   Followup with Josetta Huddle in [facility].      Geoffery Lyons, M.D.  Electronically Signed     IL/MEDQ  D:  09/12/2007  T:  09/13/2007  Job:  [id number]
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
## 2010-06-20 NOTE — Discharge Summary (Signed)
NAMEIMONIE, TUCH                           ACCOUNT NO.:  [id number]   MEDICAL RECORD NO.:  [id number]                   PATIENT TYPE:  INP   LOCATION:  0101                                 FACILITY:  [facility]   PHYSICIAN:  Beverly Milch, MD                  DATE OF BIRTH:  [birth year]   DATE OF ADMISSION:  10/17/2002  DATE OF DISCHARGE:  10/23/2002                                 DISCHARGE SUMMARY   PATIENT IDENTIFICATION:  A [age] year old female, 9th grade student at  [employer] who was admitted urgently, voluntarily from the office  of Donzetta Starch, her psychotherapist, for inpatient stabilization of suicide  risk and depression.  The patient acknowledged there were guns in the home,  but refused to identify a specific plan.  She had lost hope with her Prozac  40 mg daily, although she has been taking it for 2 years with no side  effects and with initial efficacy.  She suggests this was a pattern in her  life.  For full details, please see the typed HISTORY AND PHYSICAL.   SYNOPSIS OF PRESENT ILLNESS:  The patient manifested significant identity  confusion and diffusion.  She tends to depend on others and is not self-  directed in resolving conflicts, intrapsychic, self-sufficiency,  interpersonally.  The patient described anhedonia and hypersensitivity to  the comments or actions of others.  She describes a history of purging and  restricting in her diet, although she is not disengaged from such.  She  describes separation and generalized anxiety when parents divorced and is  now feeling left out by step-father who favors step-sister.  The patient  seems stressed by step-sister mother being arrested for drugs.  Her mother  is ambivalent about pharmacotherapy and father is scientific about this.  The patient identifies with mother.   INITIAL MENTAL STATUS EXAMINATION:  The patient reports 2 years of  psychotherapy and Prozac.  She has become depressed again and can no  longer  keep herself happy as she has in the past.  She currently identifies herself  as lesbian.  She notes that mother is not approving.  The patient is  ambivalent and inconsistent.  She has severe dysphoria with hysteroid  features.  She has negative automatic thoughts and over sensitivity  approaching paranoia.  She is suicidal but not homicidal.   LABORATORY FINDINGS:  Basic metabolic panel was normal with sodium 143,  potassium 4.2, glucose 97, and creatinine 0.7, with calcium 9.2.  CBC was  normal and MCHC borderline elevated at 34.4 with upper limit of normal 34.  White count was normal at 8300, hemoglobin 14.1, and platelet count 273,000,  with MCV of 87.  RPR was nonreactive.  GGT was normal at 9.  Free T4 was  normal at 1.07 and TSH at 1.717.  Urinalysis revealed moderate occult blood  and menses had started 09/18/2002 for  her last menses so that menses were  expected at any time currently.  Urine specific gravity was 1.022.  Urine  drug screen was negative.  Urine hCG was negative.   HOSPITAL COURSE AND TREATMENT:  The patient's Prozac was discontinued after  an admission conference with both parents and patient.  The patient had lost  confidence in her Prozac and indicated that she was tired and could not  function.  Her vital signs remained normal throughout hospital stay.  She  was started on Effexor and titrated up from 37.5 mg to 112.5 mg of the XR  every morning through the course of hospital stay.  She tolerated the  medication well, having no significant side effects.  The patient worked  diligently in the treatment process, though depending on others too much  initially.  However, over the course of her treatment, the patient did  become more assertive and more independent, though not sufficiently so by  the time of discharge to have the patient confident about more than going  home.  However, she was motivated to continue outpatient therapy.  She did  make progress  in her character function, which had significant borderline  features.  She did not manifest eating disorder symptoms during the hospital  stay and these seemed to be remitted.  Her suicidal ideation resolved and  she was safe for discharge.    FINAL DIAGNOSIS:   AXIS I:  1. Dysthymic disorder, early onset, severe, with atypical features.  2. History of general anxiety disorder with separation features.  3. History of eating disorder with restricting and purging, not otherwise     specified.  4. 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  5. Parent/child problem.  6. Other specified family circumstances.  7. Other interpersonal problems.   AXIS II:  Deferred.   AXIS III:  1. Abdominal scars from previous self-cutting.  2. Impending menses with moderate occult hematuria.   AXIS IV:  Stressors:  Family - moderate to severe, predominantly acute and  chronic; phase of life - severe, predominantly acute and chronic.   AXIS V:  Current global assessment of functioning at the time of admission  40 with highest in the last year 65, with global assessment of functioning  at the time of discharge 55.   PLAN:  The patient was discharged on Effexor 37.5 mg XR to use 3 capsules  every morning, quantity number 90 with no refills.  She may resume Claritin  10 mg daily, as needed for allergies and cold symptoms, over the counter.   She has an appointment with Manson Allan, 10/23/2002 at 1700 and again  on 10/30/2002 at 1700.  She will see Dr. Mariana Single 11/03/2002 at 1300 for  medication management.  Crisis intervention and safety plans were  established.  The family is pleased with the patient's progress.                                               Beverly Milch, MD    GJ/MEDQ  D:  10/23/2002  T:  10/25/2002  Job:  [phone]   cc:   Manson Allan, Phd  [address]  [place], [place] [address]  FAX: [phone]  Nawar M. Alnaquib, M.D.  Fax: [phone]

## 2010-06-20 NOTE — H&P (Signed)
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  The patient had active suicidal ideation but denied a specific  plan.  She did acknowledge there were guns in the home.  The patient is  experiencing an exacerbation of sadness despite ongoing Prozac 40 mg daily  for the last two years and ongoing weekly psychotherapy for two years.  The  patient disavows and disengages from the sources of depression in her life  as these are questioned, indicating that she has identity confusion and  diffusion.   HISTORY OF PRESENT ILLNESS:  The patient indicates that mother does not  acknowledge that stepfather does not love the patient.  The patient states  she has a very difficult time with loss of any relationship.  She presents a  dependent style and a hysteroid and borderline interpersonal constellation  of symptoms and vulnerability to depression.  The patient is irritable but  denies outbursts of anger; however, she suppresses and represses such.  She  has a leaden fatigue.  She describes some anhedonia, stating that she just  has no interest any longer.  She is pervasively dysphoric.  She states she  had done better in the past on her Prozac but now it seems to not be working  at all.  She does not describe any definite side effects from the Prozac,  which is longstanding in its course of treatment.  The  patient does have a  history of purging and restricting in her diet but states she has not  exhibited such for the last year.  When asked questions specifically about  various other symptoms, she seems to endorse difficulty with memory as  though she has only partial memory at times.  However, she does not describe  organic central nervous system trauma or pervasive decline in neurological  learning function.  The patient states she has had separation and possible  generalized anxiety in the past when parents divorced.  She states she was  very fearful of leaving mother at that time.  She feels like she cannot  become happy again now over the last several weeks and is having suicide  thoughts.  She cannot contract for safety any longer.  The patient feels  that others look at her and she feels that someone is trying to get into her  house.  At times, she feels like someone is controlling her thoughts,  telling her she is worthless but she does not acknowledge definite auditory  hallucinations.  She denies any substance use including no alcohol or  illicit drugs.  The patient did take Prozac when parents divorced and had  some therapy then for separation anxiety as well.   PAST MEDICAL HISTORY:  The patient his in good general health.  She denies  any active medical concerns.  She does wear eye glasses.  She has some scars  on her abdomen from previous self-cutting.  She experienced menarche  reportedly at age 89 but is not sexually active.  She has not had a  gynecological exam in the past.  Although she states she determined that she  is homosexual at age 25 or 4, she suggests that if she met the right guy,  she could become bisexual.  She seems to have significant inconsistency,  ambivalence, and diffusion in this regard.   REVIEW OF SYSTEMS:  The patient states she is vegetarian.  She has no  medication or other allergies.  She denies purging at this time but has in  the past.  She  denies seizure or syncope.  She denies abdominal pain,  nausea, vomiting, or diarrhea currently.  She denies dysuria or arthralgia.  She has no chest pain, palpitations, presyncope, or dyspnea.  She has no  headache, sensory loss of function, exposure to communicable disease or  rash.   Immunizations are up-to-date.   PHYSICAL EXAMINATION:  VITAL SIGNS:  Temperature 98.3, heart rate 69,  respirations 18, blood pressure 123/77, with weight 123 pounds and height  61.5 inches.  GENERAL:  General medical exam is completed by Vic Ripper, P.A.-C.,  and is intact.  She does have benign nevi.  NEUROLOGIC:  Exam reveals the patient is alert and oriented with speech  intact.  Cranial nerves II-XII are intact.  Deep tendon reflexes are AMRs  are 0/0.  Muscle strength and tone are normal.  There are no pathologic  reflexes or soft neurologic findings.  Ther are abnormal involuntarily  movements.  Tandem gait and Romberg are normal.  Sensory exam is intact.  The patient is right handed.   SOCIAL AND DEVELOPMENTAL HISTORY:  The patient suggests that her symptoms  first started when parents divorced.  The patient does not acknowledge any  legal difficulties.  She indicates she has had lesbian relationships but  suggests that she is not sexually active.  Her last menses was September 18, 2002.  The patient is, therefore, immediately premenstrual.  She denies  cigarettes, alcohol, or illicit drugs.   FAMILY HISTORY:  The patient resides with mother, stepfather,and stepsister.  She indicates she is stressed by stepsister's mother being busted for drugs.  She indicates that stepfather does not love her and apparently favors the  stepsister.  The patient states her mother will no believe her about this.  Mother and the patient do no acknowledge other family history genetically of  major psychiatric disorder.   MENTAL STATUS EXAM:  The patient is casually dressed with good hygiene.  She presents  with a dependent and hysteroid style though with borderline  organization.  She states she has become depressed again and can no longer  keep herself happy.  The patient seems to disavow the difficulties in her  life such as family relational stressors and past losses as well as  stressors surrounding her lesbian relationship.  The patient  states her  mother is not approving of this.  The patient is ambivalent and  inconsistent.  She reports active suicidal ideation and states there are  guns in the house.  However, she denies a specific plan to kill herself and  states she hopes she will not do so but she apparently did not contract for  safety.  She presents with severe dysphoria primarily with atypical and  hysteroid dysphoric features.  She describes a history of generalized or  separation anxiety or both.  She also describes a history of eating disorder  with purging and restricting.  Thought content and form are otherwise intact  though she has mentioned that it feels like something is controlling her  negative thoughts.  She does not describe paranoia though she does state she  is sensitive about others looking at her.  However, she does not acknowledge  the origin of such.  She is not assaultive or homicidal.   ADMISSION DIAGNOSES:   AXIS I:  1. Dysthymic disorder, early onset, severe with atypical features.  2. 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  3. History of generalized anxiety disorder with separation features.  4. History of eating disorder with restricting and purging.  5. Parent-child problem.  6. Other interpersonal problems.  7. Other specified family circumstances.   AXIS II:  Rule out personality disorder, not otherwise specified with  borderline features (provisional diagnosis).   AXIS III:  Scars on the abdomen from previous self-cutting.   AXIS IV:  Stressors: Family- moderate to severe, predominantly acute and  chronic; phase of  life-severe, predominantly acute and chronic.   AXIS V:  Current global assessment of functioning 40.   ASSETS AND STRENGTHS:  The patient is intellectually capable of benefitting  from treatment.   INITIAL PLAN OF CARE:  The patient is admitted for inpatient adolescent  psychiatric and multidisciplinary multimodal behavioral treatment in the  team based program at a locked psychiatric unit.  Cognitive behavioral and  family therapies are planned.  Will replace Prozac with Effexor and message  was left for mother in this regard.  Identity consolidation is essential and  it is important to restore the capacity for safe participation in outpatient  treatment.   ESTIMATED LENGTH OF STAY:  Four to seven days.   CONDITIONS NECESSARY FOR DISCHARGE:  Target symptoms for discharge being  stabilization of suicide risk and mood, stabilization of confusion and  identity diffusion, and generalization of capacity for safe, effective  participation in outpatient treatment.                                              Beverly Milch, MD    GJ/MEDQ  D:  10/18/2002  T:  10/18/2002  Job:  [id number]

## 2010-10-29 LAB — COMPREHENSIVE METABOLIC PANEL
BUN: 9
CO2: 24
Calcium: 9.5
Chloride: 106
Creatinine, Ser: 0.73
GFR calc non Af Amer: >60
Total Bilirubin: 0.7

## 2010-10-29 LAB — URINALYSIS, ROUTINE W REFLEX MICROSCOPIC
Bilirubin Urine: NEGATIVE
Glucose, UA: NEGATIVE
Hgb urine dipstick: NEGATIVE
Ketones, ur: NEGATIVE
pH: 6

## 2010-10-29 LAB — CBC
HCT: 43.6
MCHC: 33
MCV: 86.7
Platelets: 284
RBC: 5.03
WBC: 9.5

## 2010-10-29 LAB — BENZODIAZEPINE, QUANTITATIVE, URINE
Alprazolam (GC/LC/MS), ur confirm: NEGATIVE
Flurazepam GC/MS Conf: NEGATIVE
Nordiazepam GC/MS Conf: NEGATIVE

## 2010-10-29 LAB — DRUGS OF ABUSE SCREEN W/O ALC, ROUTINE URINE
Amphetamine Screen, Ur: NEGATIVE
Barbiturate Quant, Ur: NEGATIVE
Cocaine Metabolites: NEGATIVE
Methadone: NEGATIVE
Opiate Screen, Urine: NEGATIVE
Phencyclidine (PCP): NEGATIVE

## 2010-10-29 LAB — URINE MICROSCOPIC-ADD ON

## 2010-10-30 LAB — COMPREHENSIVE METABOLIC PANEL
ALT: 14
AST: 24
CO2: 23
Calcium: 7.9 — ABNORMAL LOW
GFR calc Af Amer: >60
GFR calc non Af Amer: >60
Potassium: 3.8
Sodium: 142
Total Protein: 6.3

## 2010-10-30 LAB — DIFFERENTIAL
Eosinophils Absolute: 0.2
Eosinophils Relative: 2
Lymphs Abs: 1.4
Monocytes Relative: 5

## 2010-10-30 LAB — TSH: TSH: 1.814 (ref 0.350–4.500)

## 2010-10-30 LAB — CBC
MCHC: 34.1
RBC: 4.03
WBC: 7.8

## 2010-10-30 LAB — URINALYSIS, ROUTINE W REFLEX MICROSCOPIC
Glucose, UA: NEGATIVE
Hgb urine dipstick: NEGATIVE
Ketones, ur: 15 — AB
Protein, ur: NEGATIVE

## 2010-10-30 LAB — POCT TOXICOLOGY PANEL: TCA Scrn: POSITIVE

## 2010-10-30 LAB — ETHANOL: Alcohol, Ethyl (B): <5

## 2010-11-14 LAB — COMPREHENSIVE METABOLIC PANEL
BUN: 4 — ABNORMAL LOW
CO2: 26
Calcium: 9
Creatinine, Ser: 0.85
GFR calc Af Amer: >60
GFR calc non Af Amer: >60
Glucose, Bld: 99
Total Bilirubin: 0.7

## 2010-11-14 LAB — URINALYSIS, ROUTINE W REFLEX MICROSCOPIC
Glucose, UA: NEGATIVE
Specific Gravity, Urine: 1.022
Urobilinogen, UA: 0.2

## 2010-11-14 LAB — CBC
HCT: 39.6
MCHC: 34.4
MCV: 88.4
RBC: 4.47

## 2010-11-14 LAB — URINE MICROSCOPIC-ADD ON

## 2010-11-14 LAB — URINE DRUGS OF ABUSE SCREEN W ALC, ROUTINE (REF LAB)
Amphetamine Screen, Ur: NEGATIVE
Marijuana Metabolite: POSITIVE — AB
Methadone: NEGATIVE
Opiate Screen, Urine: NEGATIVE
Propoxyphene: NEGATIVE

## 2013-03-04 ENCOUNTER — Ambulatory Visit (INDEPENDENT_AMBULATORY_CARE_PROVIDER_SITE_OTHER): Payer: 59 | Admitting: Internal Medicine

## 2013-03-04 VITALS — BP 122/88 | HR 145 | Temp 98.3°F | Resp 16

## 2013-03-04 DIAGNOSIS — R05 Cough: Secondary | ICD-10-CM

## 2013-03-04 DIAGNOSIS — J9801 Acute bronchospasm: Secondary | ICD-10-CM

## 2013-03-04 DIAGNOSIS — R059 Cough, unspecified: Secondary | ICD-10-CM

## 2013-03-04 DIAGNOSIS — F39 Unspecified mood [affective] disorder: Secondary | ICD-10-CM

## 2013-03-04 MED ORDER — PROMETHAZINE-DM 6.25-15 MG/5ML PO SYRP: 5.0000 mL | ORAL_SOLUTION | Freq: Four times a day (QID) | ORAL | Status: DC | PRN

## 2013-03-04 MED ORDER — PREDNISONE 20 MG PO TABS: ORAL_TABLET | ORAL | Status: DC

## 2013-03-04 MED ORDER — AZITHROMYCIN 250 MG PO TABS: ORAL_TABLET | ORAL | Status: DC

## 2013-03-04 MED ORDER — ALBUTEROL SULFATE HFA 108 (90 BASE) MCG/ACT IN AERS: 2.0000 | INHALATION_SPRAY | Freq: Four times a day (QID) | RESPIRATORY_TRACT | Status: DC | PRN

## 2013-03-04 MED ORDER — FLUCONAZOLE 150 MG PO TABS: 150.0000 mg | ORAL_TABLET | Freq: Every day | ORAL | Status: DC

## 2013-03-04 NOTE — Progress Notes (Signed)
Presents complaining of cough that is productive/sinus congestion and/sore throat/running nose/intermit fever Has been ill for 3-4 days Feels tight in chest/has been treated with inhalers before but no documented diagnosis of asthma  Current outpatient prescriptions:clonazePAM (KLONOPIN) 0.5 MG tablet, Take 0.5 mg by mouth 2 (two) times daily as needed for anxiety.  cloNIDine (CATAPRES) 0.1 MG tablet, Take 0.1 mg by mouth 2 (two) times daily., FLUoxetine (PROZAC WEEKLY) 90 MG DR capsule, Take 90 mg by mouth every 7 (seven) days. gabapentin (NEURONTIN) 300 MG capsule, Take 300 mg by mouth 3 (three) times daily., Disp: , Rfl:  norethindrone-ethinyl estradiol (OVCON-50) 1-50 MG-MCG tablet, Take 1 tablet by mouth daily paliperidone (INVEGA) 6 MG 24 hr tablet, Take 6 mg by mouth daily., Disp: , Rfl: ;    Treated for a mood disorder [facility]--stable at this point  Review of systems No headaches or vision changes No nausea vomiting or diarrhea No dysuria or frequency No joint problems No skin rashes   Impression  Acute bronchospasm secondary to lower respiratory infection with irritative Cough affecting sleep Probable underlying reactive airway disease Extensive medications for mood disorder  Meds ordered this encounter  Medications  . predniSONE (DELTASONE) 20 MG tablet    Sig: 3/3/2/2/1/1 single daily dose for 6 days    Dispense:  12 tablet    Refill:  0  . azithromycin (ZITHROMAX) 250 MG tablet    Sig: As packaged    Dispense:  6 tablet    Refill:  0  . promethazine-dextromethorphan (PROMETHAZINE-DM) 6.25-15 MG/5ML syrup    Sig: Take 5 mLs by mouth 4 (four) times daily as needed for cough.    Dispense:  118 mL    Refill:  0  . fluconazole (DIFLUCAN) 150 MG tablet    Sig: Take 1 tablet (150 mg total) by mouth daily.    Dispense:  1 tablet    Refill:  2  . albuterol (PROVENTIL HFA;VENTOLIN HFA) 108 (90 BASE) MCG/ACT inhaler    Sig: Inhale 2 puffs into the lungs every 6 (six)  hours as needed for wheezing or shortness of breath.    Dispense:  1 Inhaler    Refill:  1   Close followup/recheck. 5 days if not improving

## 2013-06-12 ENCOUNTER — Ambulatory Visit (INDEPENDENT_AMBULATORY_CARE_PROVIDER_SITE_OTHER): Payer: 59 | Admitting: Physician Assistant

## 2013-06-12 VITALS — BP 120/82 | HR 115 | Temp 98.2°F | Resp 18 | Ht 62.5 in | Wt 183.0 lb

## 2013-06-12 DIAGNOSIS — R059 Cough, unspecified: Secondary | ICD-10-CM

## 2013-06-12 DIAGNOSIS — R05 Cough: Secondary | ICD-10-CM

## 2013-06-12 DIAGNOSIS — J45901 Unspecified asthma with (acute) exacerbation: Secondary | ICD-10-CM

## 2013-06-12 MED ORDER — ALBUTEROL SULFATE (2.5 MG/3ML) 0.083% IN NEBU
2.5000 mg | INHALATION_SOLUTION | Freq: Once | RESPIRATORY_TRACT | Status: AC
  Administered 2013-06-12: 2.5 mg via RESPIRATORY_TRACT

## 2013-06-12 MED ORDER — FLUTICASONE FUROATE 100 MCG/ACT IN AEPB: 1.0000 | INHALATION_SPRAY | Freq: Every day | RESPIRATORY_TRACT | Status: DC

## 2013-06-12 MED ORDER — IPRATROPIUM BROMIDE 0.02 % IN SOLN
0.5000 mg | Freq: Once | RESPIRATORY_TRACT | Status: AC
  Administered 2013-06-12: 0.5 mg via RESPIRATORY_TRACT

## 2013-06-12 MED ORDER — CETIRIZINE HCL 10 MG PO TABS: 10.0000 mg | ORAL_TABLET | Freq: Every day | ORAL | Status: DC

## 2013-06-12 MED ORDER — PROMETHAZINE-DM 6.25-15 MG/5ML PO SYRP: 5.0000 mL | ORAL_SOLUTION | Freq: Four times a day (QID) | ORAL | Status: DC | PRN

## 2013-06-12 MED ORDER — ALBUTEROL SULFATE HFA 108 (90 BASE) MCG/ACT IN AERS: 2.0000 | INHALATION_SPRAY | Freq: Four times a day (QID) | RESPIRATORY_TRACT | Status: DC | PRN

## 2013-06-12 NOTE — Patient Instructions (Signed)
Use your albuterol (ProAir/Ventolin/Proventil) every 4 hours for the next 2-3 days, then return to as needed  Start the Arnuity Ellipta inhaler - one puff once daily - this will help control your asthma symptoms  Start the cetirizine (Zyrtec) once daily to help with allergy symptoms  Use the Phenergan DM cough syrup if needed for cough relief  Drink plenty of fluids (water is best!)  If you are not improving in 48 hours, please call or come back in  If you are having difficulty breathing, come in right away or go to the ED if we are closed   Asthma, Acute Bronchospasm Acute bronchospasm caused by asthma is also referred to as an asthma attack. Bronchospasm means your air passages become narrowed. The narrowing is caused by inflammation and tightening of the muscles in the air tubes (bronchi) in your lungs. This can make it hard to breath or cause you to wheeze and cough. CAUSES Possible triggers are:  Animal dander from the skin, hair, or feathers of animals.  Dust mites contained in house dust.  Cockroaches.  Pollen from trees or grass.  Mold.  Cigarette or tobacco smoke.  Air pollutants such as dust, household cleaners, hair sprays, aerosol sprays, paint fumes, strong chemicals, or strong odors.  Cold air or weather changes. Cold air may trigger inflammation. Winds increase molds and pollens in the air.  Strong emotions such as crying or laughing hard.  Stress.  Certain medicines such as aspirin or beta-blockers.  Sulfites in foods and drinks, such as dried fruits and wine.  Infections or inflammatory conditions, such as a flu, cold, or inflammation of the nasal membranes (rhinitis).  Gastroesophageal reflux disease (GERD). GERD is a condition where stomach acid backs up into your throat (esophagus).  Exercise or strenuous activity. SIGNS AND SYMPTOMS   Wheezing.  Excessive coughing, particularly at night.  Chest tightness.  Shortness of breath. DIAGNOSIS    Your health care provider will ask you about your medical history and perform a physical exam. A chest X-ray or blood testing may be performed to look for other causes of your symptoms or other conditions that may have triggered your asthma attack. TREATMENT  Treatment is aimed at reducing inflammation and opening up the airways in your lungs. Most asthma attacks are treated with inhaled medicines. These include quick relief or rescue medicines (such as bronchodilators) and controller medicines (such as inhaled corticosteroids). These medicines are sometimes given through an inhaler or a nebulizer. Systemic steroid medicine taken by mouth or given through an IV tube also can be used to reduce the inflammation when an attack is moderate or severe. Antibiotic medicines are only used if a bacterial infection is present.  HOME CARE INSTRUCTIONS   Rest.  Drink plenty of liquids. This helps the mucus to remain thin and be easily coughed up. Only use caffeine in moderation and do not use alcohol until you have recovered from your illness.  Do not smoke. Avoid being exposed to secondhand smoke.  You play a critical role in keeping yourself in good health. Avoid exposure to things that cause you to wheeze or to have breathing problems.  Keep your medicines up to date and available. Carefully follow your health care provider's treatment plan.  Take your medicine exactly as prescribed.  When pollen or pollution is bad, keep windows closed and use an air conditioner or go to places with air conditioning.  Asthma requires careful medical care. See your health care provider for a follow-up  as advised. If you are more than [redacted] weeks pregnant and you were prescribed any new medicines, let your obstetrician know about the visit and how you are doing. Follow-up with your health care provider as directed.  After you have recovered from your asthma attack, make an appointment with your outpatient doctor to talk  about ways to reduce the likelihood of future attacks. If you do not have a doctor who manages your asthma, make an appointment with a primary care doctor to discuss your asthma. SEEK IMMEDIATE MEDICAL CARE IF:   You are getting worse.  You have trouble breathing. If severe, call your local emergency services (911 in the [place]  You develop chest pain or discomfort.  You are vomiting.  You are not able to keep fluids down.  You are coughing up yellow, green, brown, or bloody sputum.  You have a fever and your symptoms suddenly get worse.  You have trouble swallowing. MAKE SURE YOU:   Understand these instructions.  Will watch your condition.  Will get help right away if you are not doing well or get worse. Document Released: 05/06/2006 Document Revised: 09/21/2012 Document Reviewed: 07/27/2012 [facility] Patient Information 2014 [place], [place].

## 2013-06-12 NOTE — Progress Notes (Signed)
Subjective:    [name], female    DOB: August 26, 1985, 28 y.o.   MRN: [id number]  HPI   [name] is pleasant 28 yr old female here because "I feel like I'm getting bronchitis."  Reports chest congestion, productive cough, wheezing, SOB x 3 days.  Feels achy.  Has HA.  No fever ("But 98.2 is high for me")  Has not used anything for symptoms.  Denies nasal congestion, ear pain, ST.  Reports a history of asthma - treated with albuterol only.  Usually only uses if sick.  Last episode of "bronchitis" was in January 2015.  Reports allergies have been bad this season - has not taken anything.  Has not taken anything for current cough symptoms.  She is smoking occasionally - reports she had quit at last episode of bronchitis but then recently smoked 3 cigarettes, now sick again.  Thinks she should quit smoking for good   Review of Systems  Constitutional: Negative for fever and chills.  HENT: Negative for congestion, ear pain, rhinorrhea and sore throat.   Respiratory: Positive for cough, chest tightness, shortness of breath and wheezing.   Cardiovascular: Negative.   Gastrointestinal: Negative.   Musculoskeletal: Negative.   Skin: Negative.   Neurological: Positive for headaches.       Objective:   Physical Exam  Vitals reviewed. Constitutional: She is oriented to person, place, and time. She appears well-developed and well-nourished. No distress.  HENT:  Head: Normocephalic and atraumatic.  Right Ear: Tympanic membrane and ear canal normal.  Left Ear: Tympanic membrane and ear canal normal.  Mouth/Throat: Uvula is midline, oropharynx is clear and moist and mucous membranes are normal.  Eyes: Conjunctivae are normal. No scleral icterus.  Neck: Neck supple.  Cardiovascular: Regular rhythm and normal heart sounds.  Tachycardia present.   Pulmonary/Chest: Effort normal. She has decreased breath sounds. She has wheezes (throughout). She has no rhonchi. She has no rales.    Lymphadenopathy:    She has no cervical adenopathy.  Neurological: She is alert and oriented to person, place, and time.  Skin: Skin is warm and dry.  Psychiatric: She has a normal mood and affect. Her behavior is normal.    Recheck pulse ox 94% Recheck pulse ox 95% after 1 neb Recheck pulse ox 96% after 2nd neb  On re-exam wheezing significantly improved.  She continues to have faint wheezes throughout, but has much better air movement    Assessment & Plan:  Asthma with acute exacerbation - Plan: albuterol (PROVENTIL) (2.5 MG/3ML) 0.083% nebulizer solution 2.5 mg, ipratropium (ATROVENT) nebulizer solution 0.5 mg, albuterol (PROVENTIL) (2.5 MG/3ML) 0.083% nebulizer solution 2.5 mg, albuterol (PROVENTIL HFA;VENTOLIN HFA) 108 (90 BASE) MCG/ACT inhaler, cetirizine (ZYRTEC) 10 MG tablet, Fluticasone Furoate (ARNUITY ELLIPTA) 100 MCG/ACT AEPB  Cough - Plan: promethazine-dextromethorphan (PROMETHAZINE-DM) 6.25-15 MG/5ML syrup, albuterol (PROVENTIL HFA;VENTOLIN HFA) 108 (90 BASE) MCG/ACT inhaler, cetirizine (ZYRTEC) 10 MG tablet   [name] is a pleasant 28 yr old female here with 3 days of SOB, wheezing, cough.  Suspect acute asthma exacerbation.  She is afebrile. No other URI symptoms.  There are no rhonchi or rales on exam.  Wheezes are significantly improved after 2 neb treatments, SpO2 up to 96% on RA.  Pt subjectively feels much better after nebs.  Will have her use albuterol inhaler schedule q4h for the next 2-3 days.  Add Arnuity ellipta inhaler once daily - coupon given for 1 yr supply of medication.   Add Zyrtec to  help with allergic component.  Phenergan DM if needed for cough relief.  If pt is not improving within 48 hours she is to RTC.  If any symptoms are worsening she is to RTC or ED.  Pt understands and agrees   E. Natividad Brood MHS, PA-C [facility] 5/11/20153:52 PM

## 2013-06-13 ENCOUNTER — Telehealth: Payer: Self-pay

## 2013-06-13 ENCOUNTER — Ambulatory Visit: Payer: 59

## 2013-06-13 ENCOUNTER — Ambulatory Visit (INDEPENDENT_AMBULATORY_CARE_PROVIDER_SITE_OTHER): Payer: 59 | Admitting: Family Medicine

## 2013-06-13 VITALS — BP 120/82 | HR 98 | Temp 99.2°F | Resp 22 | Ht 63.0 in | Wt 181.0 lb

## 2013-06-13 DIAGNOSIS — R05 Cough: Secondary | ICD-10-CM

## 2013-06-13 DIAGNOSIS — R059 Cough, unspecified: Secondary | ICD-10-CM

## 2013-06-13 DIAGNOSIS — R062 Wheezing: Secondary | ICD-10-CM

## 2013-06-13 DIAGNOSIS — J45901 Unspecified asthma with (acute) exacerbation: Secondary | ICD-10-CM

## 2013-06-13 MED ORDER — ALBUTEROL SULFATE (2.5 MG/3ML) 0.083% IN NEBU
2.5000 mg | INHALATION_SOLUTION | Freq: Once | RESPIRATORY_TRACT | Status: AC
  Administered 2013-06-13: 2.5 mg via RESPIRATORY_TRACT

## 2013-06-13 MED ORDER — IPRATROPIUM BROMIDE 0.02 % IN SOLN
0.5000 mg | Freq: Once | RESPIRATORY_TRACT | Status: AC
  Administered 2013-06-13: 0.5 mg via RESPIRATORY_TRACT

## 2013-06-13 MED ORDER — PREDNISONE 20 MG PO TABS: ORAL_TABLET | ORAL | Status: DC

## 2013-06-13 MED ORDER — METHYLPREDNISOLONE SODIUM SUCC 125 MG IJ SOLR
125.0000 mg | Freq: Once | INTRAMUSCULAR | Status: AC
  Administered 2013-06-13: 125 mg via INTRAMUSCULAR

## 2013-06-13 MED ORDER — ALBUTEROL SULFATE (2.5 MG/3ML) 0.083% IN NEBU: 2.5000 mg | INHALATION_SOLUTION | Freq: Four times a day (QID) | RESPIRATORY_TRACT | Status: DC | PRN

## 2013-06-13 MED ORDER — AMOXICILLIN 875 MG PO TABS: 875.0000 mg | ORAL_TABLET | Freq: Two times a day (BID) | ORAL | Status: DC

## 2013-06-13 NOTE — Progress Notes (Signed)
Subjective:    Patient ID: VALINE DROZDOWSKI, female    DOB: 25-Nov-1985, 28 y.o.   MRN: [id number]  HPI   Ms. Vanderweele is a pleasant 28 yr old female here for recheck on asthma/cough.  I initially saw the patient yesterday, see previous note for details.  She had significant wheezing that improved with neb treatments.  At the time I felt that symptoms were primarily due to asthma/bronchospasm.  She was prescribed an ICS yesterday but has not started yet.   Today she reports feeling somewhat worse than yesterday.  She called when she woke up and was told to RTC.  Continued wheezing.  Continued coughing though less productive.  She experiences some SOB with activity, i.e. Walking to her car.  She continues to be afebrile.  She admits to some post nasal drainage but otherwise has no other URI symptoms.     Review of Systems  Constitutional: Negative for fever and chills.  HENT: Positive for postnasal drip. Negative for congestion and rhinorrhea.   Respiratory: Positive for cough, shortness of breath and wheezing.   Cardiovascular: Negative.   Gastrointestinal: Negative.        Objective:   Physical Exam  Vitals reviewed. Constitutional: She is oriented to person, place, and time. She appears well-developed and well-nourished. No distress.  HENT:  Head: Normocephalic and atraumatic.  Eyes: Conjunctivae are normal. No scleral icterus.  Cardiovascular: Tachycardia present.   Pulmonary/Chest: She has no decreased breath sounds. She has wheezes (throughout - though not as significant as yesterday).  Neurological: She is alert and oriented to person, place, and time.  Skin: Skin is warm and dry.  Psychiatric: She has a normal mood and affect. Her behavior is normal.    Recheck SpO2 96% at rest  Walking pulse ox after 2 neb treatments 98%  [facility] reading (PRIMARY) by  Dr. Marin Comment - ?LLL infiltrate  IMPRESSION: Minimal hazy density over the anterior lingula which may be due to atelectasis or  infection.      Assessment & Plan:  Asthma with acute exacerbation - Plan: albuterol (PROVENTIL) (2.5 MG/3ML) 0.083% nebulizer solution 2.5 mg, ipratropium (ATROVENT) nebulizer solution 0.5 mg, DG Chest 2 View, predniSONE (DELTASONE) 20 MG tablet, albuterol (PROVENTIL) (2.5 MG/3ML) 0.083% nebulizer solution 2.5 mg, methylPREDNISolone sodium succinate (SOLU-MEDROL) 125 mg/2 mL injection 125 mg, albuterol (PROVENTIL) (2.5 MG/3ML) 0.083% nebulizer solution, DME Nebulizer machine  Wheezing - Plan: albuterol (PROVENTIL) (2.5 MG/3ML) 0.083% nebulizer solution 2.5 mg, ipratropium (ATROVENT) nebulizer solution 0.5 mg, DG Chest 2 View, albuterol (PROVENTIL) (2.5 MG/3ML) 0.083% nebulizer solution 2.5 mg, albuterol (PROVENTIL) (2.5 MG/3ML) 0.083% nebulizer solution, DME Nebulizer machine   Ms. Schoof is a pleasant 28 yr old female here with persistent asthma attack.  On exam, she is improved from yesterday - much less wheezing.  Symptoms greatly improved with 2 nebulizer treatments.  Walking SpO2 at discharge is 98% on RA.  IM solumedrol given in clinic.  Will start oral prednisone taper tomorrow.  (Pt with significant mood d/o, on multiple meds - has tolerated steroids well in the past.) Start Arnuity as directed.  Continue albuterol inhaler prn.  Pt requests nebulizer machine - I have provided a written order for this.  Given CXR w/ possible infection vs atelectasis, will cover with amoxicillin (d/t potential interaction with azithro and paliperidone.)  Recheck Thurs or Fri - sooner if worse   Pt to call or RTC if worsening or not improving  E. Natividad Brood MHS, PA-C [facility] 5/12/20155:25 PM

## 2013-06-13 NOTE — Telephone Encounter (Signed)
Agree.  Pt needs to RTC

## 2013-06-13 NOTE — Patient Instructions (Addendum)
We have given you an injection of steroid medication today  Start the prednisone by mouth tomorrow.  Take 3 pills at one time in the morning for 3 days, then 2 pills for 3 days, then 1 pill for 3 days  Start the Arnuity Ellipta inhaler once daily  Continue using albuterol as needed  Please come back in Thursday (8a-8:30p) or Friday (8a-6p) so we can recheck you  Come back sooner if any symptoms are worsening or not improving    Asthma, Adult Asthma is a recurring condition in which the airways tighten and narrow. Asthma can make it difficult to breathe. It can cause coughing, wheezing, and shortness of breath. Asthma episodes (also called asthma attacks) range from minor to life-threatening. Asthma cannot be cured, but medicines and lifestyle changes can help control it. CAUSES Asthma is believed to be caused by inherited (genetic) and environmental factors, but its exact cause is unknown. Asthma may be triggered by allergens, lung infections, or irritants in the air. Asthma triggers are different for each person. Common triggers include:   Animal dander.  Dust mites.  Cockroaches.  Pollen from trees or grass.  Mold.  Smoke.  Air pollutants such as dust, household cleaners, hair sprays, aerosol sprays, paint fumes, strong chemicals, or strong odors.  Cold air, weather changes, and winds (which increase molds and pollens in the air).  Strong emotional expressions such as crying or laughing hard.  Stress.  Certain medicines (such as aspirin) or types of drugs (such as beta-blockers).  Sulfites in foods and drinks. Foods and drinks that may contain sulfites include dried fruit, potato chips, and sparkling grape juice.  Infections or inflammatory conditions such as the flu, a cold, or an inflammation of the nasal membranes (rhinitis).  Gastroesophageal reflux disease (GERD).  Exercise or strenuous activity. SYMPTOMS Symptoms may occur immediately after asthma is triggered  or many hours later. Symptoms include:  Wheezing.  Excessive nighttime or early morning coughing.  Frequent or severe coughing with a common cold.  Chest tightness.  Shortness of breath. DIAGNOSIS  The diagnosis of asthma is made by a review of your medical history and a physical exam. Tests may also be performed. These may include:  Lung function studies. These tests show how much air you breath in and out.  Allergy tests.  Imaging tests such as X-rays. TREATMENT  Asthma cannot be cured, but it can usually be controlled. Treatment involves identifying and avoiding your asthma triggers. It also involves medicines. There are 2 classes of medicine used for asthma treatment:   Controller medicines. These prevent asthma symptoms from occurring. They are usually taken every day.  Reliever or rescue medicines. These quickly relieve asthma symptoms. They are used as needed and provide short-term relief. Your health care provider will help you create an asthma action plan. An asthma action plan is a written plan for managing and treating your asthma attacks. It includes a list of your asthma triggers and how they may be avoided. It also includes information on when medicines should be taken and when their dosage should be changed. An action plan may also involve the use of a device called a peak flow meter. A peak flow meter measures how well the lungs are working. It helps you monitor your condition. HOME CARE INSTRUCTIONS   Take medicine as directed by your health care provider. Speak with your health care provider if you have questions about how or when to take the medicines.  Use a peak flow  meter as directed by your health care provider. Record and keep track of readings.  Understand and use the action plan to help minimize or stop an asthma attack without needing to seek medical care.  Control your home environment in the following ways to help prevent asthma attacks:  Do not smoke.  Avoid being exposed to secondhand smoke.  Change your heating and air conditioning filter regularly.  Limit your use of fireplaces and wood stoves.  Get rid of pests (such as roaches and mice) and their droppings.  Throw away plants if you see mold on them.  Clean your floors and dust regularly. Use unscented cleaning products.  Try to have someone else vacuum for you regularly. Stay out of rooms while they are being vacuumed and for a short while afterward. If you vacuum, use a dust mask from a hardware store, a double-layered or microfilter vacuum cleaner bag, or a vacuum cleaner with a HEPA filter.  Replace carpet with wood, tile, or vinyl flooring. Carpet can trap dander and dust.  Use allergy-proof pillows, mattress covers, and box spring covers.  Wash bed sheets and blankets every week in hot water and dry them in a dryer.  Use blankets that are made of polyester or cotton.  Clean bathrooms and kitchens with bleach. If possible, have someone repaint the walls in these rooms with mold-resistant paint. Keep out of the rooms that are being cleaned and painted.  Wash hands frequently. SEEK MEDICAL CARE IF:   You have wheezing, shortness of breath, or a cough even if taking medicine to prevent attacks.  The colored mucus you cough up (sputum) is thicker than usual.  Your sputum changes from clear or white to yellow, green, gray, or bloody.  You have any problems that may be related to the medicines you are taking (such as a rash, itching, swelling, or trouble breathing).  You are using a reliever medicine more than 2 3 times per week.  Your peak flow is still at 50 79% of you personal best after following your action plan for 1 hour. SEEK IMMEDIATE MEDICAL CARE IF:   You seem to be getting worse and are unresponsive to treatment during an asthma attack.  You are short of breath even at rest.  You get short of breath when doing very little physical activity.  You have  difficulty eating, drinking, or talking due to asthma symptoms.  You develop chest pain.  You develop a fast heartbeat.  You have a bluish color to your lips or fingernails.  You are lightheaded, dizzy, or faint.  Your peak flow is less than 50% of your personal best.  You have a fever or persistent symptoms for more than 2 3 days.  You have a fever and symptoms suddenly get worse. MAKE SURE YOU:   Understand these instructions.  Will watch your condition.  Will get help right away if you are not doing well or get worse. Document Released: 01/19/2005 Document Revised: 09/21/2012 Document Reviewed: 08/18/2012 [facility] Patient Information 2014 [place], [place].

## 2013-06-13 NOTE — Telephone Encounter (Signed)
Pt is on her way in. Her breathing improved after the Neb tx yesterday but now is the same as it was. She needs another Neb.

## 2013-06-13 NOTE — Telephone Encounter (Signed)
Patient called stated her breathing is not improving. Please call patient at [phone]

## 2013-06-14 ENCOUNTER — Other Ambulatory Visit: Payer: Self-pay | Admitting: Physician Assistant

## 2013-06-14 DIAGNOSIS — R062 Wheezing: Secondary | ICD-10-CM

## 2013-06-14 DIAGNOSIS — J45901 Unspecified asthma with (acute) exacerbation: Secondary | ICD-10-CM

## 2013-06-14 DIAGNOSIS — R059 Cough, unspecified: Secondary | ICD-10-CM

## 2013-06-14 DIAGNOSIS — R05 Cough: Secondary | ICD-10-CM

## 2013-06-14 MED ORDER — AMOXICILLIN 875 MG PO TABS: 875.0000 mg | ORAL_TABLET | Freq: Two times a day (BID) | ORAL | Status: DC

## 2013-06-14 MED ORDER — ALBUTEROL SULFATE (2.5 MG/3ML) 0.083% IN NEBU: 2.5000 mg | INHALATION_SOLUTION | Freq: Four times a day (QID) | RESPIRATORY_TRACT | Status: DC | PRN

## 2013-06-14 MED ORDER — ALBUTEROL SULFATE HFA 108 (90 BASE) MCG/ACT IN AERS: 2.0000 | INHALATION_SPRAY | Freq: Four times a day (QID) | RESPIRATORY_TRACT | Status: DC | PRN

## 2013-06-14 MED ORDER — PREDNISONE 20 MG PO TABS: ORAL_TABLET | ORAL | Status: DC

## 2013-06-14 NOTE — Telephone Encounter (Signed)
Patient states that her pharmacy's electronic system is down. Can she have a hardcopy of her script for Amoxicillin and Prednisone?  [phone]

## 2013-06-14 NOTE — Telephone Encounter (Signed)
LM rx in pick up drawer for pt.

## 2013-06-14 NOTE — Telephone Encounter (Signed)
Prescribed yesterday with Benjamine Mola. I have printed scripts to be signed.

## 2013-08-12 ENCOUNTER — Ambulatory Visit (INDEPENDENT_AMBULATORY_CARE_PROVIDER_SITE_OTHER): Payer: 59 | Admitting: Emergency Medicine

## 2013-08-12 VITALS — BP 96/70 | HR 102 | Temp 98.3°F | Resp 20 | Ht 63.0 in | Wt 188.4 lb

## 2013-08-12 DIAGNOSIS — F4310 Post-traumatic stress disorder, unspecified: Secondary | ICD-10-CM

## 2013-08-12 DIAGNOSIS — F411 Generalized anxiety disorder: Secondary | ICD-10-CM

## 2013-08-12 DIAGNOSIS — F329 Major depressive disorder, single episode, unspecified: Secondary | ICD-10-CM

## 2013-08-12 DIAGNOSIS — F32A Depression, unspecified: Secondary | ICD-10-CM

## 2013-08-12 DIAGNOSIS — F3289 Other specified depressive episodes: Secondary | ICD-10-CM

## 2013-08-12 MED ORDER — GABAPENTIN 300 MG PO CAPS: 300.0000 mg | ORAL_CAPSULE | Freq: Three times a day (TID) | ORAL | Status: DC

## 2013-08-12 MED ORDER — PALIPERIDONE ER 6 MG PO TB24: 6.0000 mg | ORAL_TABLET | Freq: Every day | ORAL | Status: DC

## 2013-08-12 NOTE — Patient Instructions (Signed)
Please call with prozac dose and I will get a prescription for you.

## 2013-08-12 NOTE — Progress Notes (Signed)
[facility] [address] [place] [address] [phone]  Date:  08/12/2013   Name:  [name]   DOB:  30-Sep-1985   MRN:  [id number]  PCP:  No PCP Per Patient    Chief Complaint: Medication Refill   History of Present Illness:  [name] is a 28 y.o. very pleasant female patient who presents with the following:  Patient is under treatment for depression, anxiety and PTSD.  Missed an appt with her therapist last week and so was unable to obtain a refill.  Is out of medication and took her last dose last night.  Denies other complaint or health concern today.   Patient Active Problem List   Diagnosis Date Noted  . Mood disorder 03/04/2013    Past Medical History  Diagnosis Date  . Allergy   . Asthma     No past surgical history on file.  History  Substance Use Topics  . Smoking status: Former Research scientist (life sciences)  . Smokeless tobacco: Never Used  . Alcohol Use: No    No family history on file.  Allergies  Allergen Reactions  . Cogentin [Benztropine]     Medication list has been reviewed and updated.  Current Outpatient Prescriptions on File Prior to Visit  Medication Sig Dispense Refill  . albuterol (PROVENTIL HFA;VENTOLIN HFA) 108 (90 BASE) MCG/ACT inhaler Inhale 2 puffs into the lungs every 6 (six) hours as needed for wheezing or shortness of breath.  1 Inhaler  1  . albuterol (PROVENTIL) (2.5 MG/3ML) 0.083% nebulizer solution Take 3 mLs (2.5 mg total) by nebulization every 6 (six) hours as needed for wheezing or shortness of breath.  75 mL  12  . cetirizine (ZYRTEC) 10 MG tablet Take 1 tablet (10 mg total) by mouth daily.  90 tablet  1  . clonazePAM (KLONOPIN) 0.5 MG tablet Take 0.5 mg by mouth 2 (two) times daily as needed for anxiety.      . cloNIDine (CATAPRES) 0.1 MG tablet Take 0.1 mg by mouth 2 (two) times daily.      [name] FLUoxetine (PROZAC WEEKLY) 90 MG DR capsule Take 90 mg by mouth every 7 (seven) days.      . Fluticasone Furoate  (ARNUITY ELLIPTA) 100 MCG/ACT AEPB Inhale 1 puff into the lungs daily.  30 each  11  . gabapentin (NEURONTIN) 300 MG capsule Take 300 mg by mouth 3 (three) times daily.      . paliperidone (INVEGA) 6 MG 24 hr tablet Take 6 mg by mouth daily.       No current facility-administered medications on file prior to visit.    Review of Systems:  As per HPI, otherwise negative.    Physical Examination: Filed Vitals:   08/12/13 1515  BP: 96/70  Pulse: 102  Temp: 98.3 F (36.8 C)  Resp: 20   Filed Vitals:   08/12/13 1515  Height: 5\' 3"  (1.6 m)  Weight: 188 lb 6 oz (85.446 kg)   Body mass index is 33.38 kg/(m^2). Ideal Body Weight: Weight in (lb) to have BMI = 25: 140.8   GEN: WDWN, NAD, Non-toxic, Alert & Oriented x 3 HEENT: Atraumatic, Normocephalic.  Ears and Nose: No external deformity. EXTR: No clubbing/cyanosis/edema NEURO: Normal gait.  PSYCH: Normally interactive. Conversant. Not depressed or anxious appearing.  Calm demeanor.    Assessment and Plan: PTSD, Depression and anxiety Refill   Signed,  Ellison Carwin, MD

## 2013-09-11 ENCOUNTER — Telehealth: Payer: Self-pay

## 2013-09-11 DIAGNOSIS — J45909 Unspecified asthma, uncomplicated: Secondary | ICD-10-CM

## 2013-09-11 NOTE — Telephone Encounter (Signed)
Dr Marin Comment, Arnuity Ellipta inh is not covered by ins. Ins normally does cover the Flovent inh. Can we switch her to it? Pended, but not sure which strength you would want to Rx.

## 2013-09-14 ENCOUNTER — Other Ambulatory Visit: Payer: Self-pay | Admitting: Family Medicine

## 2013-09-14 DIAGNOSIS — J45909 Unspecified asthma, uncomplicated: Secondary | ICD-10-CM

## 2013-09-14 MED ORDER — FLUTICASONE PROPIONATE HFA 110 MCG/ACT IN AERO: 1.0000 | INHALATION_SPRAY | Freq: Two times a day (BID) | RESPIRATORY_TRACT | Status: DC

## 2013-09-14 MED ORDER — FLUTICASONE PROPIONATE (INHAL) 50 MCG/BLIST IN AEPB: 1.0000 | INHALATION_SPRAY | Freq: Two times a day (BID) | RESPIRATORY_TRACT | Status: DC

## 2013-09-14 NOTE — Telephone Encounter (Signed)
I sent in the rx for flovent inh twice daily . The flovent diskus  is incorrect, the pharmacy has already been notified so do not worry about this rx Everything is done. Thanks Tle

## 2013-11-14 ENCOUNTER — Ambulatory Visit (INDEPENDENT_AMBULATORY_CARE_PROVIDER_SITE_OTHER): Payer: 59 | Admitting: Internal Medicine

## 2013-11-14 VITALS — BP 124/82 | HR 105 | Temp 98.4°F | Resp 18 | Ht 63.5 in | Wt 195.0 lb

## 2013-11-14 DIAGNOSIS — J0100 Acute maxillary sinusitis, unspecified: Secondary | ICD-10-CM

## 2013-11-14 DIAGNOSIS — R062 Wheezing: Secondary | ICD-10-CM

## 2013-11-14 MED ORDER — AMOXICILLIN 875 MG PO TABS: 875.0000 mg | ORAL_TABLET | Freq: Two times a day (BID) | ORAL | Status: DC

## 2013-11-14 NOTE — Progress Notes (Signed)
Subjective:    [name], female    DOB: Apr 25, 1985, 28 y.o.   MRN: [id number] This chart was scribed for Beverly Lin, MD by [name], Medical Scribe. This patient was seen in Room 13 and the patient's care was started a 4:51 PM.   HPI HPI Comments: [name] is a 28 y.o. female who presents to [facility] complaining of flu symptoms that started three days ago. Pt states she has been using her nebulizer at home with some relief of congestion symptoms. Pt endorses rhinorrhea, mild cough with sputum, wheezing, and sinus pressure as associated symptoms. Pt denies sore throat or fever. Pt is not a smoker.  Patient Active Problem List   Diagnosis Date Noted  . Mood disorder 03/04/2013   Prior to Admission medications   Medication Sig Start Date End Date Taking? Authorizing [name]  albuterol (PROVENTIL HFA;VENTOLIN HFA) 108 (90 BASE) MCG/ACT inhaler Inhale 2 puffs into the lungs every 6 (six) hours as needed for wheezing or shortness of breath. 06/14/13  Yes Beverly Bale, PA-C  albuterol (PROVENTIL) (2.5 MG/3ML) 0.083% nebulizer solution Take 3 mLs (2.5 mg total) by nebulization every 6 (six) hours as needed for wheezing or shortness of breath. 06/14/13  Yes Beverly Bale, PA-C  cetirizine (ZYRTEC) 10 MG tablet Take 1 tablet (10 mg total) by mouth daily. 06/12/13  Yes Beverly Kurtis Bushman, PA-C  clonazePAM (KLONOPIN) 0.5 MG tablet Take 0.5 mg by mouth 2 (two) times daily as needed for anxiety.   Yes Beverly Provider, MD  cloNIDine (CATAPRES) 0.1 MG tablet Take 0.1 mg by mouth 2 (two) times daily.   Yes Beverly Provider, MD  gabapentin (NEURONTIN) 300 MG capsule Take 1 capsule (300 mg total) by mouth 3 (three) times daily. 08/12/13  Yes Roselee Culver, MD  norethindrone-ethinyl estradiol (OVCON-50) 1-50 MG-MCG tablet Take 1 tablet by mouth daily.   Yes Beverly Provider, MD  paliperidone (INVEGA) 6 MG 24 hr tablet Take 1 tablet (6 mg total) by mouth daily. 08/12/13   Yes Roselee Culver, MD  amoxicillin (AMOXIL) 875 MG tablet Take 1 tablet (875 mg total) by mouth 2 (two) times daily. 11/14/13   Leandrew Koyanagi, MD  fluticasone (FLOVENT DISKUS) 50 MCG/BLIST diskus inhaler Inhale 1 puff into the lungs 2 (two) times daily. 09/14/13   Thao P Le, DO  fluticasone (FLOVENT HFA) 110 MCG/ACT inhaler Inhale 1 puff into the lungs 2 (two) times daily. 09/14/13   Thao P Le, DO    Review of Systems  Constitutional: Positive for fatigue. Negative for fever.  HENT: Positive for rhinorrhea and sinus pressure. Negative for sore throat.   Respiratory: Positive for cough.        Objective:   Physical Exam  Nursing note and vitals reviewed. Constitutional: She is oriented to person, place, and time. She appears well-developed and well-nourished.  HENT:  Head: Normocephalic and atraumatic.  Right Ear: Tympanic membrane normal.  Left Ear: Tympanic membrane normal.  Mouth/Throat: No oropharyngeal exudate.  Eyes: Pupils are equal, round, and reactive to light.  Neck: No JVD present.  Cardiovascular: Normal rate and regular rhythm.   Pulmonary/Chest: Effort normal. No respiratory distress. She has wheezes.  With forced expiration bilaterally.   Neurological: She is alert and oriented to person, place, and time.  Skin: Skin is warm and dry.  Psychiatric: She has a normal mood and affect. Her behavior is normal.          Assessment &  Plan:   I have completed the patient encounter in its entirety as documented by the scribe, with editing by me where necessary. Johanny Segers P. Laney Pastor, M.D. Wheezing  Acute maxillary sinusitis, recurrence not specified  Meds ordered this encounter  Medications  . amoxicillin (AMOXIL) 875 MG tablet    Sig: Take 1 tablet (875 mg total) by mouth 2 (two) times daily.    Dispense:  20 tablet    Refill:  0   Sample qvar 2pbid 1 mo

## 2013-11-20 ENCOUNTER — Telehealth: Payer: Self-pay

## 2013-11-20 NOTE — Telephone Encounter (Signed)
Patient calling to ask if Dr. Laney Pastor can prescribe something for her yeast infection. patient was recently seen by him and received an antibiotic on the 13th. She says she is showing symptoms of a yeast infection and so is her boyfriend (she says he is uncircumcised) she also requests extra medication for a yeast infection for him also.   Pharmacy: [place]: [phone]- patient says its okay to leave a detailed message because she is working. The voicemail will say her boyfriends name zack- they share the same phone.

## 2013-11-21 MED ORDER — FLUCONAZOLE 150 MG PO TABS: ORAL_TABLET | ORAL | Status: DC

## 2014-03-07 ENCOUNTER — Ambulatory Visit (INDEPENDENT_AMBULATORY_CARE_PROVIDER_SITE_OTHER): Payer: 59 | Admitting: Family Medicine

## 2014-03-07 VITALS — BP 94/68 | HR 106 | Temp 98.5°F | Resp 16 | Ht 63.0 in | Wt 203.1 lb

## 2014-03-07 DIAGNOSIS — F32A Depression, unspecified: Secondary | ICD-10-CM

## 2014-03-07 DIAGNOSIS — F4310 Post-traumatic stress disorder, unspecified: Secondary | ICD-10-CM

## 2014-03-07 DIAGNOSIS — F329 Major depressive disorder, single episode, unspecified: Secondary | ICD-10-CM

## 2014-03-07 DIAGNOSIS — F411 Generalized anxiety disorder: Secondary | ICD-10-CM

## 2014-03-07 DIAGNOSIS — F603 Borderline personality disorder: Secondary | ICD-10-CM

## 2014-03-07 MED ORDER — GABAPENTIN 300 MG PO CAPS: 300.0000 mg | ORAL_CAPSULE | Freq: Three times a day (TID) | ORAL | Status: DC

## 2014-03-07 MED ORDER — FLUOXETINE HCL 20 MG PO CAPS: 60.0000 mg | ORAL_CAPSULE | Freq: Every day | ORAL | Status: DC

## 2014-03-07 MED ORDER — PALIPERIDONE ER 6 MG PO TB24: 6.0000 mg | ORAL_TABLET | Freq: Every day | ORAL | Status: DC

## 2014-03-07 NOTE — Patient Instructions (Signed)
I refilled meds for next 2 months, but keep follow up with usual provider.  Return there or here sooner if any new or worsening symptoms.

## 2014-03-07 NOTE — Progress Notes (Signed)
Subjective:  This chart was scribed for Merri Ray, MD by Dellis Filbert, ED Scribe at [facility].The patient was seen in exam room 01 and the patient's care was started at 7:58 PM.   [name], female    DOB: 1985/11/11, 29 y.o.   MRN: [id number] Chief Complaint  Patient presents with  . Medication Refill    Gabapentin, Prozac & Invega   HPI HPI Comments: [name] is a 29 y.o. female with a history of asthma and allergies who presents to [facility] for prescription refill for Neurontin 1 capsule TID, Prozac 60 mg qd and Invega. Last seen here by Dr. Laney Pastor on 12/02/2013 for wheezing and sinusitis. Dr. Ouida Sills July 2015 for PTSD, depression and anxiety. Natividad Brood may 2015 with a cough and asthma. She seen in July of last year she had missed an appointment with her therapist and out of medication at that time so was prescribed her Lorayne Bender and Neurontin both were a 30 day supply. Followed my the [facility], last appointment was in September. Missed her appointment in November and cant be seen until March 17th. She has not missed any doses of Prozac, no change in her doses and no changes in her symptoms.  No new symptoms with her Neurontin. Takes Invega to help her sleep. No new side effects with her medications. No problems ambulating and speaking. No suicidal ideation, homicidal ideation, no alcohol use and no illicit drug use.   Patient Active Problem List   Diagnosis Date Noted  . Mood disorder 03/04/2013   Past Medical History  Diagnosis Date  . Allergy   . Asthma    History reviewed. No pertinent past surgical history. Allergies  Allergen Reactions  . Cogentin [Benztropine]    Prior to Admission medications   Medication Sig Start Date End Date Taking? Authorizing Provider  albuterol (PROVENTIL HFA;VENTOLIN HFA) 108 (90 BASE) MCG/ACT inhaler Inhale 2 puffs into the lungs every 6 (six) hours as needed for wheezing or shortness  of breath. 06/14/13  Yes Mancel Bale, PA-C  albuterol (PROVENTIL) (2.5 MG/3ML) 0.083% nebulizer solution Take 3 mLs (2.5 mg total) by nebulization every 6 (six) hours as needed for wheezing or shortness of breath. 06/14/13  Yes Mancel Bale, PA-C  cetirizine (ZYRTEC) 10 MG tablet Take 1 tablet (10 mg total) by mouth daily. 06/12/13  Yes Eleanore Kurtis Bushman, PA-C  FLUoxetine (PROZAC) 20 MG capsule Take 60 mg by mouth daily.   Yes Historical Provider, MD  gabapentin (NEURONTIN) 300 MG capsule Take 1 capsule (300 mg total) by mouth 3 (three) times daily. 08/12/13  Yes Roselee Culver, MD  norethindrone-ethinyl estradiol (OVCON-50) 1-50 MG-MCG tablet Take 1 tablet by mouth daily.   Yes Historical Provider, MD  paliperidone (INVEGA) 6 MG 24 hr tablet Take 1 tablet (6 mg total) by mouth daily. 08/12/13  Yes Roselee Culver, MD  clonazePAM (KLONOPIN) 0.5 MG tablet Take 0.5 mg by mouth 2 (two) times daily as needed for anxiety.    Historical Provider, MD  cloNIDine (CATAPRES) 0.1 MG tablet Take 0.1 mg by mouth 2 (two) times daily.    Historical Provider, MD  fluticasone (FLOVENT DISKUS) 50 MCG/BLIST diskus inhaler Inhale 1 puff into the lungs 2 (two) times daily. Patient not taking: Reported on 03/07/2014 09/14/13   Thao P Le, DO  fluticasone (FLOVENT HFA) 110 MCG/ACT inhaler Inhale 1 puff into the lungs 2 (two) times daily. Patient not taking:  Reported on 03/07/2014 09/14/13   Glenford Bayley, DO   History   Social History  . Marital Status: Single    Spouse Name: N/A    Number of Children: N/A  . Years of Education: N/A   Occupational History  . Not on file.   Social History Main Topics  . Smoking status: Current Every Day Smoker  . Smokeless tobacco: Never Used  . Alcohol Use: No  . Drug Use: No  . Sexual Activity: Yes   Other Topics Concern  . Not on file   Social History Narrative   Review of Systems  Psychiatric/Behavioral: Negative for suicidal ideas and self-injury.      Objective:  BP  94/68 mmHg  Pulse 106  Temp(Src) 98.5 F (36.9 C) (Oral)  Resp 16  Ht 5\' 3"  (1.6 m)  Wt 203 lb 2 oz (92.137 kg)  BMI 35.99 kg/m2  SpO2 98%  LMP 03/03/2014  Physical Exam  Constitutional: She is oriented to person, place, and time. She appears well-developed and well-nourished. No distress.  HENT:  Head: Normocephalic and atraumatic.  Eyes: Pupils are equal, round, and reactive to light.  Neck: Normal range of motion.  Cardiovascular: Normal rate, regular rhythm and normal heart sounds.  Exam reveals no gallop and no friction rub.   No murmur heard. Pulmonary/Chest: Effort normal. No respiratory distress.  Musculoskeletal: Normal range of motion.  Neurological: She is alert and oriented to person, place, and time.  Skin: Skin is warm and dry.  Psychiatric: She has a normal mood and affect. Her behavior is normal.  Nursing note and vitals reviewed.     Assessment & Plan:    [name] is a 29 y.o. female PTSD (post-traumatic stress disorder) - Plan: paliperidone (INVEGA) 6 MG 24 hr tablet, gabapentin (NEURONTIN) 300 MG capsule, FLUoxetine (PROZAC) 20 MG capsule  Depression - Plan: paliperidone (INVEGA) 6 MG 24 hr tablet, gabapentin (NEURONTIN) 300 MG capsule, FLUoxetine (PROZAC) 20 MG capsule  Anxiety state - Plan: paliperidone (INVEGA) 6 MG 24 hr tablet, gabapentin (NEURONTIN) 300 MG capsule, FLUoxetine (PROZAC) 20 MG capsule  Borderline personality disorder - Plan: paliperidone (INVEGA) 6 MG 24 hr tablet, gabapentin (NEURONTIN) 300 MG capsule, FLUoxetine (PROZAC) 20 MG capsule  Above conditions stable by her hx and has been on same doses of meds without new side effects recently. Refilled at same doses for 2 months to allow time to be seen by [facility] provider. rtc precautions.   Meds ordered this encounter  Medications  . DISCONTD: FLUoxetine (PROZAC) 20 MG capsule    Sig: Take 60 mg by mouth daily.  . paliperidone (INVEGA) 6 MG 24 hr tablet    Sig: Take 1 tablet (6  mg total) by mouth daily.    Dispense:  30 tablet    Refill:  1  . gabapentin (NEURONTIN) 300 MG capsule    Sig: Take 1 capsule (300 mg total) by mouth 3 (three) times daily.    Dispense:  90 capsule    Refill:  1  . FLUoxetine (PROZAC) 20 MG capsule    Sig: Take 3 capsules (60 mg total) by mouth daily.    Dispense:  90 capsule    Refill:  1   Patient Instructions  I refilled meds for next 2 months, but keep follow up with usual provider.  Return there or here sooner if any new or worsening symptoms.     I personally performed the services described in this documentation, which was  scribed in my presence. The recorded information has been reviewed and considered, and addended by me as needed.

## 2014-04-03 ENCOUNTER — Telehealth: Payer: Self-pay

## 2014-04-03 NOTE — Telephone Encounter (Signed)
Pt states she will be running out her Invega,she just needs  About a 3 day supply to get her through to her march 8 appt at [facility] [phone] and you may leave a message on phone    [place]

## 2014-04-03 NOTE — Telephone Encounter (Signed)
paliperidone (INVEGA) 6 MG 24 hr tablet [[id number]]     Order Details    Dose: 6 mg Route: Oral Frequency: Daily   Dispense Quantity:  30 tablet Refills:  1 Fills Remaining:  1          Sig: Take 1 tablet (6 mg total) by mouth daily.         Written Date:  03/07/14 Expiration Date:  03/07/15     Start Date:  03/07/14 End Date:  --     Ordering Provider:  -- Authorizing Provider:  Wendie Agreste, MD Ordering User:  Wendie Agreste, MD          Diagnosis Association: PTSD (post-traumatic stress disorder) (309.81); Depression (311); Anxiety state (300.00); Borderline personality disorder (301.83)        Can we refill Dr. Carlota Raspberry?

## 2014-04-04 NOTE — Telephone Encounter (Signed)
Left message on V[employer] that she may have another refill at the pharmacy. Call me back if they don't.

## 2014-04-04 NOTE — Telephone Encounter (Signed)
i prescribed 30 day supply and refill last ov - should have enough until ov in march with [facility].  Did she get the refill?

## 2014-05-01 ENCOUNTER — Ambulatory Visit (INDEPENDENT_AMBULATORY_CARE_PROVIDER_SITE_OTHER): Payer: 59 | Admitting: Family Medicine

## 2014-05-01 VITALS — BP 118/80 | HR 87 | Temp 98.1°F | Resp 18 | Ht 63.0 in | Wt 208.0 lb

## 2014-05-01 DIAGNOSIS — F411 Generalized anxiety disorder: Secondary | ICD-10-CM

## 2014-05-01 DIAGNOSIS — F4310 Post-traumatic stress disorder, unspecified: Secondary | ICD-10-CM | POA: Diagnosis not present

## 2014-05-01 DIAGNOSIS — F603 Borderline personality disorder: Secondary | ICD-10-CM | POA: Diagnosis not present

## 2014-05-01 DIAGNOSIS — F329 Major depressive disorder, single episode, unspecified: Secondary | ICD-10-CM

## 2014-05-01 DIAGNOSIS — F418 Other specified anxiety disorders: Secondary | ICD-10-CM

## 2014-05-01 DIAGNOSIS — F32A Depression, unspecified: Secondary | ICD-10-CM

## 2014-05-01 MED ORDER — GABAPENTIN 300 MG PO CAPS: 300.0000 mg | ORAL_CAPSULE | Freq: Three times a day (TID) | ORAL | Status: DC

## 2014-05-01 MED ORDER — PALIPERIDONE ER 6 MG PO TB24: 6.0000 mg | ORAL_TABLET | Freq: Every day | ORAL | Status: DC

## 2014-05-01 MED ORDER — FLUOXETINE HCL 20 MG PO CAPS: 60.0000 mg | ORAL_CAPSULE | Freq: Every day | ORAL | Status: DC

## 2014-05-01 NOTE — Progress Notes (Signed)
Subjective:   This chart was scribed for Beverly Ray, MD by Erling Conte, Medical Scribe. This patient was seen in Room 9 and the patient's care was started at 6:41 PM.   [name], female    DOB: 06-10-1985, 29 y.o.   MRN: [id number]  Chief Complaint  Patient presents with  . Medication Refill    Invega 6mg , Prozac 20mg , and Neurontin 300mg     HPI [name] is a 29 y.o. female  She is here for medication refill. She is followed by [facility] clinic for PTSD, depression, anxiety and borderline personality disorder. Dr. Carlota Raspberry saw her last on February 3rd, at that visit she had an appt on March 17th so I agreed to refill medications at that time when she could follow up with usual provider of medications. I gave 30 day supply with 1 refill aet visit on February 3rd in order to allow time to see with her provider at [facility]. Her appt on March 17th she was 3 minutes late and was turned away and she states she did not want to return there. She talked to her therapist, Margaretmary Lombard, and her therapist wrote a letter stating the pt is receiving therapy with her. Pt is in the process of trying to find a new psychiatrist but is having trouble getting an appt.  Pt is taking Invega 6mg , Prozac 20mg  and Neurontin 300 mg, and has been taking the same dose and medication for 4-5 years.  Pt was initially diagnosed with depression, anxiety and borderline personality disorder by Dr. Francine Graven and has considered returning to see her. Her last visit with Dr. Joneen Caraway was about 4-5 years ago. She denies any recent inpatient care. Pt denies any issues with her medications at this time and states she has been stable. She denies any SI, hallucinations, sleep issues, illicit drug use or ETOH use.   Patient Active Problem List   Diagnosis Date Noted  . Mood disorder 03/04/2013   Past Medical History  Diagnosis Date  . Allergy   . Asthma    History reviewed. No pertinent past  surgical history. Allergies  Allergen Reactions  . Cogentin [Benztropine]    Prior to Admission medications   Medication Sig Start Date End Date Taking? Authorizing Provider  albuterol (PROVENTIL HFA;VENTOLIN HFA) 108 (90 BASE) MCG/ACT inhaler Inhale 2 puffs into the lungs every 6 (six) hours as needed for wheezing or shortness of breath. 06/14/13  Yes Mancel Bale, PA-C  albuterol (PROVENTIL) (2.5 MG/3ML) 0.083% nebulizer solution Take 3 mLs (2.5 mg total) by nebulization every 6 (six) hours as needed for wheezing or shortness of breath. 06/14/13  Yes Mancel Bale, PA-C  cetirizine (ZYRTEC) 10 MG tablet Take 1 tablet (10 mg total) by mouth daily. 06/12/13  Yes Eleanore Kurtis Bushman, PA-C  clonazePAM (KLONOPIN) 0.5 MG tablet Take 0.5 mg by mouth 2 (two) times daily as needed for anxiety.   Yes Historical Provider, MD  cloNIDine (CATAPRES) 0.1 MG tablet Take 0.1 mg by mouth 2 (two) times daily.   Yes Historical Provider, MD  FLUoxetine (PROZAC) 20 MG capsule Take 3 capsules (60 mg total) by mouth daily. 03/07/14  Yes Wendie Agreste, MD  gabapentin (NEURONTIN) 300 MG capsule Take 1 capsule (300 mg total) by mouth 3 (three) times daily. 03/07/14  Yes Wendie Agreste, MD  norethindrone-ethinyl estradiol (OVCON-50) 1-50 MG-MCG tablet Take 1 tablet by mouth daily.   Yes Historical Provider, MD  paliperidone (INVEGA)  6 MG 24 hr tablet Take 1 tablet (6 mg total) by mouth daily. 03/07/14  Yes Wendie Agreste, MD   History   Social History  . Marital Status: Single    Spouse Name: N/A  . Number of Children: N/A  . Years of Education: N/A   Occupational History  . Not on file.   Social History Main Topics  . Smoking status: Current Every Day Smoker  . Smokeless tobacco: Never Used  . Alcohol Use: No  . Drug Use: No  . Sexual Activity: Yes   Other Topics Concern  . Not on file   Social History Narrative    Review of Systems  Psychiatric/Behavioral: Negative for suicidal ideas, hallucinations and  sleep disturbance.       Objective:   Physical Exam  Constitutional: She is oriented to person, place, and time. She appears well-developed and well-nourished. No distress.  HENT:  Head: Normocephalic and atraumatic.  Eyes: Conjunctivae and EOM are normal. Pupils are equal, round, and reactive to light.  Neck: Neck supple. Carotid bruit is not present. No tracheal deviation present. No thyroid mass and no thyromegaly present.  Cardiovascular: Normal rate, regular rhythm, normal heart sounds and intact distal pulses.   Pulmonary/Chest: Effort normal and breath sounds normal. No respiratory distress.  Abdominal: Soft. She exhibits no pulsatile midline mass. There is no tenderness.  Musculoskeletal: Normal range of motion.  Neurological: She is alert and oriented to person, place, and time.  Skin: Skin is warm and dry.  Psychiatric: She has a normal mood and affect. Her behavior is normal.  Nursing note and vitals reviewed.   Filed Vitals:   05/01/14 1740  BP: 118/80  Pulse: 87  Temp: 98.1 F (36.7 C)  TempSrc: Oral  Resp: 18  Height: 5\' 3"  (1.6 m)  Weight: 208 lb (94.348 kg)  SpO2: 98%       Assessment & Plan:   [name] is a 29 y.o. female PTSD (post-traumatic stress disorder) - Plan: Ambulatory referral to Psychiatry, paliperidone (INVEGA) 6 MG 24 hr tablet, FLUoxetine (PROZAC) 20 MG capsule, gabapentin (NEURONTIN) 300 MG capsule  Borderline personality disorder - Plan: Ambulatory referral to Psychiatry, paliperidone (INVEGA) 6 MG 24 hr tablet, FLUoxetine (PROZAC) 20 MG capsule, gabapentin (NEURONTIN) 300 MG capsule  Depression with anxiety - Plan: Ambulatory referral to Psychiatry  Depression - Plan: paliperidone (INVEGA) 6 MG 24 hr tablet, FLUoxetine (PROZAC) 20 MG capsule, gabapentin (NEURONTIN) 300 MG capsule  Anxiety state - Plan: paliperidone (INVEGA) 6 MG 24 hr tablet, FLUoxetine (PROZAC) 20 MG capsule, gabapentin (NEURONTIN) 300 MG  capsule   Stable.controlled above diagnoses on reported same meds for years now. Transferring psychiatric care to new psychiatrist. As she has been under reported good control/stable - will temporarily refill these meds while obtaining new psychiatrist. Referral placed. rtc precautions.   Meds ordered this encounter  Medications  . paliperidone (INVEGA) 6 MG 24 hr tablet    Sig: Take 1 tablet (6 mg total) by mouth daily.    Dispense:  30 tablet    Refill:  2  . FLUoxetine (PROZAC) 20 MG capsule    Sig: Take 3 capsules (60 mg total) by mouth daily.    Dispense:  90 capsule    Refill:  2  . gabapentin (NEURONTIN) 300 MG capsule    Sig: Take 1 capsule (300 mg total) by mouth 3 (three) times daily.    Dispense:  90 capsule    Refill:  2  Patient Instructions  I refilled meds for up to next 3 months while you arrange new psychiatrist as your symptoms have been stable. I placed a referral to see if this helps, but you can continue to call around as well. Let me know if a particular practice needs a referral.   Schedule physical in next 6 months.  Return to the clinic or go to the nearest emergency room if any of your symptoms worsen or new symptoms occur.     I personally performed the services described in this documentation, which was scribed in my presence. The recorded information has been reviewed and considered, and addended by me as needed.

## 2014-05-01 NOTE — Patient Instructions (Signed)
I refilled meds for up to next 3 months while you arrange new psychiatrist as your symptoms have been stable. I placed a referral to see if this helps, but you can continue to call around as well. Let me know if a particular practice needs a referral.   Schedule physical in next 6 months.  Return to the clinic or go to the nearest emergency room if any of your symptoms worsen or new symptoms occur.

## 2015-03-30 ENCOUNTER — Ambulatory Visit (INDEPENDENT_AMBULATORY_CARE_PROVIDER_SITE_OTHER): Payer: 59

## 2015-03-30 ENCOUNTER — Ambulatory Visit (INDEPENDENT_AMBULATORY_CARE_PROVIDER_SITE_OTHER): Payer: 59 | Admitting: Emergency Medicine

## 2015-03-30 VITALS — BP 124/76 | HR 119 | Temp 99.0°F | Resp 14 | Ht 62.0 in | Wt 203.0 lb

## 2015-03-30 DIAGNOSIS — R059 Cough, unspecified: Secondary | ICD-10-CM

## 2015-03-30 DIAGNOSIS — N912 Amenorrhea, unspecified: Secondary | ICD-10-CM

## 2015-03-30 DIAGNOSIS — R05 Cough: Secondary | ICD-10-CM

## 2015-03-30 DIAGNOSIS — J029 Acute pharyngitis, unspecified: Secondary | ICD-10-CM | POA: Diagnosis not present

## 2015-03-30 DIAGNOSIS — J45901 Unspecified asthma with (acute) exacerbation: Secondary | ICD-10-CM | POA: Diagnosis not present

## 2015-03-30 DIAGNOSIS — J101 Influenza due to other identified influenza virus with other respiratory manifestations: Secondary | ICD-10-CM

## 2015-03-30 LAB — POCT RAPID STREP A (OFFICE): RAPID STREP A SCREEN: NEGATIVE

## 2015-03-30 LAB — POCT INFLUENZA A/B
INFLUENZA B, POC: NEGATIVE
Influenza A, POC: POSITIVE — AB

## 2015-03-30 LAB — POCT URINE PREGNANCY: PREG TEST UR: NEGATIVE

## 2015-03-30 MED ORDER — ALBUTEROL SULFATE HFA 108 (90 BASE) MCG/ACT IN AERS: 2.0000 | INHALATION_SPRAY | Freq: Four times a day (QID) | RESPIRATORY_TRACT | Status: DC | PRN

## 2015-03-30 MED ORDER — BENZONATATE 100 MG PO CAPS: 100.0000 mg | ORAL_CAPSULE | Freq: Three times a day (TID) | ORAL | Status: DC | PRN

## 2015-03-30 MED ORDER — OSELTAMIVIR PHOSPHATE 75 MG PO CAPS: 75.0000 mg | ORAL_CAPSULE | Freq: Two times a day (BID) | ORAL | Status: DC

## 2015-03-30 NOTE — Progress Notes (Addendum)
This chart was scribed for Beverly Jordan, MD by [facility], medical scribe at [facility].The patient was seen in exam room 09 and the patient's care was started at 12:42 PM.  Chief Complaint:  Chief Complaint  Patient presents with  . Sore Throat    All started yesterday   . Cough  . Fever    100.7 yesterday  . Chills  . Sinusitis  . Diarrhea    x last night   HPI: MARKIYA MANA is a 30 y.o. female who reports to [facility] today complaining of nasal congestion, and fatigue which began two days ago. Yesterday she developed a fever, body aches chills and sweats. Associated cough and diarrhea. Diarrhea began last night, she describes it as watery. No flu shot. She does smoke, occasional wheezing. 4-5 cigarettes a day.  Past Medical History  Diagnosis Date  . Allergy   . Asthma    No past surgical history on file. Social History   Social History  . Marital Status: Single    Spouse Name: N/A  . Number of Children: N/A  . Years of Education: N/A   Social History Main Topics  . Smoking status: Current Every Day Smoker  . Smokeless tobacco: Never Used  . Alcohol Use: No  . Drug Use: No  . Sexual Activity: Yes   Other Topics Concern  . None   Social History Narrative   No family history on file. Allergies  Allergen Reactions  . Cogentin [Benztropine]    Prior to Admission medications   Medication Sig Start Date End Date Taking? Authorizing Provider  albuterol (PROVENTIL HFA;VENTOLIN HFA) 108 (90 BASE) MCG/ACT inhaler Inhale 2 puffs into the lungs every 6 (six) hours as needed for wheezing or shortness of breath. 06/14/13  Yes Mancel Bale, PA-C  albuterol (PROVENTIL) (2.5 MG/3ML) 0.083% nebulizer solution Take 3 mLs (2.5 mg total) by nebulization every 6 (six) hours as needed for wheezing or shortness of breath. 06/14/13  Yes Mancel Bale, PA-C  cetirizine (ZYRTEC) 10 MG tablet Take 1 tablet (10 mg total) by mouth daily. 06/12/13  Yes Eleanore Kurtis Bushman, PA-C  clonazePAM (KLONOPIN) 0.5 MG tablet Take 0.5 mg by mouth 2 (two) times daily as needed for anxiety.   Yes Historical Provider, MD  cloNIDine (CATAPRES) 0.1 MG tablet Take 0.1 mg by mouth 2 (two) times daily.   Yes Historical Provider, MD  FLUoxetine (PROZAC) 20 MG capsule Take 3 capsules (60 mg total) by mouth daily. 05/01/14  Yes Wendie Agreste, MD  gabapentin (NEURONTIN) 300 MG capsule Take 1 capsule (300 mg total) by mouth 3 (three) times daily. 05/01/14  Yes Wendie Agreste, MD  norethindrone-ethinyl estradiol (OVCON-50) 1-50 MG-MCG tablet Take 1 tablet by mouth daily.   Yes Historical Provider, MD  paliperidone (INVEGA) 6 MG 24 hr tablet Take 1 tablet (6 mg total) by mouth daily. 05/01/14  Yes Wendie Agreste, MD    ROS: The patient denies fevers, chills, night sweats, unintentional weight loss, chest pain, palpitations, wheezing, dyspnea on exertion, nausea, vomiting, abdominal pain, dysuria, hematuria, melena, numbness, weakness, or tingling.  All other systems have been reviewed and were otherwise negative with the exception of those mentioned in the HPI and as above.    PHYSICAL EXAM: Filed Vitals:   03/30/15 1138  BP: 124/76  Pulse: 119  Temp: 99 F (37.2 C)  Resp: 14   Body mass index is 37.12 kg/(m^2).  General:  Alert, no acute distress HEENT:  Normocephalic, atraumatic, oropharynx patent. Significant nasal congestion, throat is red. Eye: [facility] Cardiovascular:  Regular rate and rhythm, no rubs murmurs or gallops.  No Carotid bruits, radial pulse intact. No pedal edema.  Respiratory: Clear to auscultation bilaterally.  No cyanosis, no use of accessory musculature. Bilateral rhonchi with wheezes. Abdominal: No organomegaly, abdomen is soft and non-tender, positive bowel sounds.  No masses. Musculoskeletal: Gait intact. No edema, tenderness Skin: No rashes. Neurologic: Facial musculature symmetric. Psychiatric: Patient acts appropriately throughout our  interaction. Lymphatic: No cervical or submandibular lymphadenopathy Genitourinary/Anorectal: No acute findings  LABS: Results for orders placed or performed in visit on 03/30/15  POCT rapid strep A  Result Value Ref Range   Rapid Strep A Screen Negative Negative  POCT Influenza A/B  Result Value Ref Range   Influenza A, POC Positive (A) Negative   Influenza B, POC Negative Negative   No results found.   Dg Chest 2 View  03/30/2015  CLINICAL DATA:  29 year old with acute onset of fever, chills, myalgias, cough, diarrhea and sinonasal congestion which began 2 days ago. EXAM: CHEST  2 VIEW COMPARISON:  06/13/2013 and earlier, including CT chest 11/19/2008. FINDINGS: Cardiomediastinal silhouette unremarkable. Lungs clear. Bronchovascular markings normal. Pulmonary vascularity normal. No pneumothorax. No pleural effusions. Visualized bony thorax intact. IMPRESSION: Normal examination. Electronically Signed   By: Evangeline Dakin M.D.   On: 03/30/2015 14:09   ASSESSMENT/PLAN: Patient tested positive for influenza A. Will treat with Tamiflu and  Tessalon Perles and use her albuterol inhaler 2 puffs every 4-6 hours. recheck in 48 hours if not better Gross sideeffects, risk and benefits, and alternatives of medications d/w patient. Patient is aware that all medications have potential sideeffects and we are unable to predict every sideeffect or drug-drug interaction that may occur.  By signing my name below, I, Nadim Abuhashem, attest that this documentation has been prepared under the direction and in the presence of Beverly Jordan, MD.  Electronically Signed: Lora Havens, medical scribe. 03/30/2015, 12:50 PM.  Arlyss Queen MD 03/30/2015 12:42 PM

## 2015-03-30 NOTE — Patient Instructions (Addendum)
Influenza, Adult Influenza ("the flu") is a viral infection of the respiratory tract. It occurs more often in winter months because people spend more time in close contact with one another. Influenza can make you feel very sick. Influenza easily spreads from person to person (contagious). CAUSES  Influenza is caused by a virus that infects the respiratory tract. You can catch the virus by breathing in droplets from an infected person's cough or sneeze. You can also catch the virus by touching something that was recently contaminated with the virus and then touching your mouth, nose, or eyes. RISKS AND COMPLICATIONS You may be at risk for a more severe case of influenza if you smoke cigarettes, have diabetes, have chronic heart disease (such as heart failure) or lung disease (such as asthma), or if you have a weakened immune system. Elderly people and pregnant women are also at risk for more serious infections. The most common problem of influenza is a lung infection (pneumonia). Sometimes, this problem can require emergency medical care and may be life threatening. SIGNS AND SYMPTOMS  Symptoms typically last 4 to 10 days and may include:  Fever.  Chills.  Headache, body aches, and muscle aches.  Sore throat.  Chest discomfort and cough.  Poor appetite.  Weakness or feeling tired.  Dizziness.  Nausea or vomiting. DIAGNOSIS  Diagnosis of influenza is often made based on your history and a physical exam. A nose or throat swab test can be done to confirm the diagnosis. TREATMENT  In mild cases, influenza goes away on its own. Treatment is directed at relieving symptoms. For more severe cases, your health care provider may prescribe antiviral medicines to shorten the sickness. Antibiotic medicines are not effective because the infection is caused by a virus, not by bacteria. HOME CARE INSTRUCTIONS  Take medicines only as directed by your health care provider.  Use a cool mist humidifier  to make breathing easier.  Get plenty of rest until your temperature returns to normal. This usually takes 3 to 4 days.  Drink enough fluid to keep your urine clear or pale yellow.  Cover yourmouth and nosewhen coughing or sneezing,and wash your handswellto prevent thevirusfrom spreading.  Stay homefromwork orschool untilthe fever is gonefor at least 58full day. PREVENTION  An annual influenza vaccination (flu shot) is the best way to avoid getting influenza. An annual flu shot is now routinely recommended for all adults in the [place] IF:  You experiencechest pain, yourcough worsens,or you producemore mucus.  Youhave nausea,vomiting, ordiarrhea.  Your fever returns or gets worse. SEEK IMMEDIATE MEDICAL CARE IF:  You havetrouble breathing, you become short of breath,or your skin ornails becomebluish.  You have severe painor stiffnessin the neck.  You develop a sudden headache, or pain in the face or ear.  You have nausea or vomiting that you cannot control. MAKE SURE YOU:   Understand these instructions.  Will watch your condition.  Will get help right away if you are not doing well or get worse.   This information is not intended to replace advice given to you by your health care provider. Make sure you discuss any questions you have with your health care provider.   Document Released: 01/17/2000 Document Revised: 02/09/2014 Document Reviewed: 04/20/2011 Elsevier Interactive Patient Education [employer].    Because you received an x-ray today, you will receive an invoice from [facility]. Please contact [facility] at [phone] with questions or concerns regarding your invoice. Our billing staff will not  be able to assist you with those questions.

## 2015-03-30 NOTE — Progress Notes (Deleted)
    MRN: [id number] DOB: Jun 15, 1985  Subjective:   [name] is a 30 y.o. female presenting for chief complaint of Sore Throat; Cough; Fever; Chills; Sinusitis; and Diarrhea  Reports ***. Has *** tried *** relief. Denies ***. *** smoking, *** alcohol.   [name] has a current medication list which includes the following prescription(s): albuterol, albuterol, cetirizine, clonazepam, clonidine, fluoxetine, gabapentin, norethindrone-ethinyl estradiol, and paliperidone. Also is allergic to cogentin.  [name]  has a past medical history of Allergy and Asthma. Also  has no past surgical history on file.  Objective:   Vitals: BP 124/76 mmHg  Pulse 119  Temp(Src) 99 F (37.2 C) (Oral)  Resp 14  Ht 5\' 2"  (1.575 m)  Wt 203 lb (92.08 kg)  BMI 37.12 kg/m2  SpO2 96%  LMP 03/09/2015  Physical Exam  No results found for this or any previous visit (from the past 24 hour(s)).  Assessment and Plan :     Jaynee Eagles, PA-C [facility] [phone] 03/30/2015 12:08 PM

## 2015-04-03 ENCOUNTER — Telehealth: Payer: Self-pay

## 2015-04-03 NOTE — Telephone Encounter (Signed)
Pt saw Dr. Everlene Farrier on Saturday and he gave her a note to return back to work 04/03/15  but she woke up with a fever this morning and would like a note excusing her for today. Please call patient when note is ready.

## 2015-04-04 NOTE — Telephone Encounter (Signed)
Patient is calling because she needs a note to return to work today. Patient cannot return to work without a note.

## 2015-04-04 NOTE — Telephone Encounter (Signed)
Pt would like [place] to fax this RTW note to- Attention Lovena Le. Fax [phone]. Please CB pt at [phone]

## 2015-04-15 ENCOUNTER — Ambulatory Visit (INDEPENDENT_AMBULATORY_CARE_PROVIDER_SITE_OTHER): Payer: 59 | Admitting: Urgent Care

## 2015-04-15 VITALS — BP 102/76 | HR 110 | Temp 99.4°F | Resp 18 | Ht 64.0 in | Wt 199.0 lb

## 2015-04-15 DIAGNOSIS — R05 Cough: Secondary | ICD-10-CM | POA: Diagnosis not present

## 2015-04-15 DIAGNOSIS — J040 Acute laryngitis: Secondary | ICD-10-CM

## 2015-04-15 DIAGNOSIS — J029 Acute pharyngitis, unspecified: Secondary | ICD-10-CM | POA: Diagnosis not present

## 2015-04-15 DIAGNOSIS — R059 Cough, unspecified: Secondary | ICD-10-CM

## 2015-04-15 DIAGNOSIS — Z720 Tobacco use: Secondary | ICD-10-CM | POA: Diagnosis not present

## 2015-04-15 MED ORDER — BENZONATATE 100 MG PO CAPS: 100.0000 mg | ORAL_CAPSULE | Freq: Three times a day (TID) | ORAL | Status: DC | PRN

## 2015-04-15 MED ORDER — AMOXICILLIN 875 MG PO TABS: 875.0000 mg | ORAL_TABLET | Freq: Two times a day (BID) | ORAL | Status: DC

## 2015-04-15 NOTE — Patient Instructions (Signed)
Laryngitis  Laryngitis is inflammation of your vocal cords. This causes hoarseness, coughing, loss of voice, sore throat, or a dry throat. Your vocal cords are two bands of muscles that are found in your throat. When you speak, these cords come together and vibrate. These vibrations come out through your mouth as sound. When your vocal cords are inflamed, your voice sounds different.  Laryngitis can be temporary (acute) or long-term (chronic). Most cases of acute laryngitis improve with time. Chronic laryngitis is laryngitis that lasts for more than three weeks.  CAUSES  Acute laryngitis may be caused by:   A viral infection.   Lots of talking, yelling, or singing. This is also called vocal strain.   Bacterial infections.  Chronic laryngitis may be caused by:   Vocal strain.   Injury to your vocal cords.   Acid reflux (gastroesophageal reflux disease or GERD).   Allergies.   Sinus infection.   Smoking.   Alcohol abuse.   Breathing in chemicals or dust.   Growths on the vocal cords.  RISK FACTORS  Risk factors for laryngitis include:   Smoking.   Alcohol abuse.   Having allergies.  SIGNS AND SYMPTOMS  Symptoms of laryngitis may include:   Low, hoarse voice.   Loss of voice.   Dry cough.   Sore throat.   Stuffy nose.  DIAGNOSIS  Laryngitis may be diagnosed by:   Physical exam.   Throat culture.   Blood test.   Laryngoscopy. This procedure allows your health care provider to look at your vocal cords with a mirror or viewing tube.  TREATMENT  Treatment for laryngitis depends on what is causing it. Usually, treatment involves resting your voice and using medicines to soothe your throat. However, if your laryngitis is caused by a bacterial infection, you may need to take antibiotic medicine. If your laryngitis is caused by a growth, you may need to have a procedure to remove it.  HOME CARE INSTRUCTIONS   Drink enough fluid to keep your urine clear or pale yellow.   Breathe in moist air. Use a  humidifier if you live in a dry climate.   Take medicines only as directed by your health care provider.   If you were prescribed an antibiotic medicine, finish it all even if you start to feel better.   Do not smoke cigarettes or electronic cigarettes. If you need help quitting, ask your health care provider.   Talk as little as possible. Also avoid whispering, which can cause vocal strain.   Write instead of talking. Do this until your voice is back to normal.  SEEK MEDICAL CARE IF:   You have a fever.   You have increasing pain.   You have difficulty swallowing.  SEEK IMMEDIATE MEDICAL CARE IF:   You cough up blood.   You have trouble breathing.     This information is not intended to replace advice given to you by your health care provider. Make sure you discuss any questions you have with your health care provider.     Document Released: 01/19/2005 Document Revised: 02/09/2014 Document Reviewed: 07/04/2013  Elsevier Interactive Patient Education 2016 Elsevier Inc.

## 2015-04-15 NOTE — Progress Notes (Signed)
    MRN: [id number] DOB: [birth year]  Subjective:   [name] is a 30 y.o. female presenting for chief complaint of Follow-up; Sore Throat; and Cough  Reports ongoing sore throat, hoarseness, loss of voice, fatigue, mild dry cough, subjective fever. Patient was last seen 03/30/2015 and diagnosed with the influenza. Smokes 4-5 cigarettes per day. Not interested in quitting. Takes Zyrtec for allergies. Patient is a Scientist, water quality at [employer], has to talk all the time. Denies ear pain, ear drainage, chest pain, shob, wheezing, n/v, abdominal pain.  Cyana has a current medication list which includes the following prescription(s): albuterol, albuterol, cetirizine, fluoxetine, gabapentin, norethindrone-ethinyl estradiol, paliperidone, benzonatate, clonazepam, clonidine, and oseltamivir. Also is allergic to cogentin.  Ramiyah  has a past medical history of Allergy and Asthma. Also  has no past surgical history on file.  Objective:   Vitals: BP 102/76 mmHg  Pulse 110  Temp(Src) 99.4 F (37.4 C)  Resp 18  Ht 5\' 4"  (1.626 m)  Wt 199 lb (90.266 kg)  BMI 34.14 kg/m2  SpO2 98%  LMP 03/09/2015  Physical Exam  Constitutional: She is oriented to person, place, and time. She appears well-developed and well-nourished.  HENT:  TM's intact bilaterally, no effusions or erythema. Nasal turbinates dry, nasal passages patent. No sinus tenderness. Oropharynx with slight erythema and post-nasal drainage but otherwise no exudates or tonsillar erythema or edema. Mucous membranes moist, dentition in good repair.  Eyes: Right eye exhibits no discharge. Left eye exhibits no discharge. No scleral icterus.  Neck: Normal range of motion. Neck supple.  Cardiovascular: Normal rate, regular rhythm and intact distal pulses.  Exam reveals no gallop and no friction rub.   No murmur heard. Pulmonary/Chest: No respiratory distress. She has no wheezes. She has no rales.  Musculoskeletal: She exhibits no edema.  Lymphadenopathy:     She has cervical adenopathy (mild, bilateral).  Neurological: She is alert and oriented to person, place, and time.  Skin: Skin is warm and dry.   Assessment and Plan :   1. Laryngitis 2. Cough 3. Sore throat - Will cover for infectious process with Amoxicillin, refilled Tessalon. Work restrictions provided. RTC in 1 week if no improvement.  4. Tobacco use - Advised patient quit smoking at least for the duration of her illness.  Jaynee Eagles, PA-C [facility] [phone] 04/15/2015 9:51 AM

## 2015-04-22 ENCOUNTER — Ambulatory Visit (INDEPENDENT_AMBULATORY_CARE_PROVIDER_SITE_OTHER): Payer: 59 | Admitting: Internal Medicine

## 2015-04-22 ENCOUNTER — Ambulatory Visit (INDEPENDENT_AMBULATORY_CARE_PROVIDER_SITE_OTHER): Payer: 59

## 2015-04-22 VITALS — BP 126/80 | HR 75 | Temp 99.0°F | Resp 16 | Ht 63.0 in | Wt 201.4 lb

## 2015-04-22 DIAGNOSIS — R059 Cough, unspecified: Secondary | ICD-10-CM

## 2015-04-22 DIAGNOSIS — R062 Wheezing: Secondary | ICD-10-CM

## 2015-04-22 DIAGNOSIS — R05 Cough: Secondary | ICD-10-CM

## 2015-04-22 DIAGNOSIS — R490 Dysphonia: Secondary | ICD-10-CM | POA: Diagnosis not present

## 2015-04-22 MED ORDER — MELOXICAM 15 MG PO TABS: 15.0000 mg | ORAL_TABLET | Freq: Every day | ORAL | Status: DC

## 2015-04-22 MED ORDER — DOXYCYCLINE HYCLATE 100 MG PO TABS
100.0000 mg | ORAL_TABLET | Freq: Two times a day (BID) | ORAL | Status: DC
Start: 2015-04-22 — End: 2015-12-25

## 2015-04-22 NOTE — Progress Notes (Signed)
Subjective:  .By signing my name below, I, Doran Stabler, attest that this documentation has been prepared under the direction and in the presence of Algie Cales P. Laney Pastor, MD. Electronically Signed: Doran Stabler, ED Scribe. 04/22/2015. 4:10 PM.   [name], female    DOB: 1986-01-07, 30 y.o.   MRN: [id number] Chief Complaint  Patient presents with  . Laryngitis    seen on 04/15/15, pt states 'It's not better"    HPI HPI Comments: [name] is a 30 y.o. female who presents to the [facility] for an evaluation of unchanged symptoms s/p laryngitis dx on 04/15/15. Pt currently complains of a loss of voice, cough, sore throat, and fatigue.  Pt reports her symptoms began with the flu on 03/30/15. She states she began recovering from it but then was dx with laryngitis during a follow-up visit.She is on amoxicillin for her laryngitis but feels she is not feeling better. Pt denies any decreased appetite, or any other symptoms at this time.  Continues to cough--more productive last few days  Pt is a Scientist, water quality at [employer]  Patient Active Problem List   Diagnosis Date Noted  . Mood disorder ([place]) 03/04/2013    Past Medical History  Diagnosis Date  . Allergy   . Asthma    Current Outpatient Prescriptions on File Prior to Visit  Medication Sig Dispense Refill  . albuterol (PROVENTIL HFA;VENTOLIN HFA) 108 (90 Base) MCG/ACT inhaler Inhale 2 puffs into the lungs every 6 (six) hours as needed for wheezing or shortness of breath. 1 Inhaler 1  . albuterol (PROVENTIL) (2.5 MG/3ML) 0.083% nebulizer solution Take 3 mLs (2.5 mg total) by nebulization every 6 (six) hours as needed for wheezing or shortness of breath. 75 mL 12  . amoxicillin (AMOXIL) 875 MG tablet Take 1 tablet (875 mg total) by mouth 2 (two) times daily. 20 tablet 0  . benzonatate (TESSALON) 100 MG capsule Take 1-2 capsules (100-200 mg total) by mouth 3 (three) times daily as needed for cough. 40  capsule 0  . cetirizine (ZYRTEC) 10 MG tablet Take 1 tablet (10 mg total) by mouth daily. 90 tablet 1  . FLUoxetine (PROZAC) 20 MG capsule Take 3 capsules (60 mg total) by mouth daily. 90 capsule 2  . gabapentin (NEURONTIN) 300 MG capsule Take 1 capsule (300 mg total) by mouth 3 (three) times daily. 90 capsule 2  . norethindrone-ethinyl estradiol (OVCON-50) 1-50 MG-MCG tablet Take 1 tablet by mouth daily.    . paliperidone (INVEGA) 6 MG 24 hr tablet Take 1 tablet (6 mg total) by mouth daily. 30 tablet 2  . clonazePAM (KLONOPIN) 0.5 MG tablet Take 0.5 mg by mouth 2 (two) times daily as needed for anxiety. Reported on 04/22/2015    . cloNIDine (CATAPRES) 0.1 MG tablet Take 0.1 mg by mouth 2 (two) times daily. Reported on 04/22/2015     No current facility-administered medications on file prior to visit.   Allergies  Allergen Reactions  . Cogentin [Benztropine]    Review of Systems  Constitutional: Positive for fatigue. Negative for appetite change (decreased).  HENT: Positive for sore throat and voice change (decreased).   Respiratory: Positive for cough.       Objective:   Physical Exam  Constitutional: She is oriented to person, place, and time. She appears well-developed and well-nourished.  Voice whisper  HENT:  Head: Normocephalic and atraumatic.  Nose: Nose normal.  Mouth/Throat: Oropharynx is clear and moist.  Cardiovascular: Normal  rate.   Pulmonary/Chest: Effort normal.  Decreased breath sounds at the right base; wheezing on inspiration on the right base.   Abdominal: She exhibits no distension.  Neurological: She is alert and oriented to person, place, and time.  Skin: Skin is warm and dry.  Psychiatric: She has a normal mood and affect.  Nursing note and vitals reviewed. BP 126/80 mmHg  Pulse 75  Temp(Src) 99 F (37.2 C) (Oral)  Resp 16  Ht 5\' 3"  (1.6 m)  Wt 201 lb 6.4 oz (91.354 kg)  BMI 35.69 kg/m2  SpO2 96%  LMP 04/08/2015  CXR - ? mild increased marking  right lower lobe    Assessment & Plan:  Cough - Plan: DG Chest 2 View  Wheezing - Plan: DG Chest 2 View  LRI with RAD/Exacerb asthma  Hoarseness  Meds ordered this encounter  Medications  . meloxicam (MOBIC) 15 MG tablet    Sig: Take 1 tablet (15 mg total) by mouth daily. As long as hoarseness of voice    Dispense:  15 tablet    Refill:  0  . doxycycline (VIBRA-TABS) 100 MG tablet    Sig: Take 1 tablet (100 mg total) by mouth 2 (two) times daily.    Dispense:  20 tablet    Refill:  0   Fluids/voice rest(so OOW 4d) F/u not well 1 w      I have completed the patient encounter in its entirety as documented by the scribe, with editing by me where necessary. Porche Steinberger P. Laney Pastor, M.D.

## 2015-04-22 NOTE — Patient Instructions (Signed)
     IF you received an x-ray today, you will receive an invoice from [facility]. Please contact [facility] at [phone] with questions or concerns regarding your invoice.   IF you received labwork today, you will receive an invoice from [facility]. Please contact [facility] at [phone] with questions or concerns regarding your invoice.   Our billing staff will not be able to assist you with questions regarding bills from these companies.  You will be contacted with the lab results as soon as they are available. The fastest way to get your results is to activate your My Chart account. Instructions are located on the last page of this paperwork. If you have not heard from us regarding the results in 2 weeks, please contact this office.      

## 2015-04-29 ENCOUNTER — Telehealth: Payer: Self-pay

## 2015-04-29 NOTE — Telephone Encounter (Signed)
Patient stated she is having symptoms of a yeast infection from the antibiotic. [employer]. [phone].

## 2015-04-30 MED ORDER — FLUCONAZOLE 150 MG PO TABS: 150.0000 mg | ORAL_TABLET | Freq: Once | ORAL | Status: DC

## 2015-04-30 NOTE — Telephone Encounter (Signed)
Okay to write for Diflucan 150 once, repeat in 48 hours if needed.  [name], MS, PA-C 9:59 AM, 04/30/2015

## 2015-04-30 NOTE — Telephone Encounter (Signed)
Sent in Rx. Pt aware. 

## 2015-12-25 ENCOUNTER — Ambulatory Visit (INDEPENDENT_AMBULATORY_CARE_PROVIDER_SITE_OTHER): Payer: 59 | Admitting: Family Medicine

## 2015-12-25 VITALS — BP 110/74 | HR 90 | Temp 98.2°F | Resp 20 | Ht 63.0 in | Wt 211.0 lb

## 2015-12-25 DIAGNOSIS — R059 Cough, unspecified: Secondary | ICD-10-CM

## 2015-12-25 DIAGNOSIS — J45901 Unspecified asthma with (acute) exacerbation: Secondary | ICD-10-CM

## 2015-12-25 DIAGNOSIS — R062 Wheezing: Secondary | ICD-10-CM

## 2015-12-25 DIAGNOSIS — R05 Cough: Secondary | ICD-10-CM | POA: Diagnosis not present

## 2015-12-25 MED ORDER — DOXYCYCLINE HYCLATE 100 MG PO TABS: 100.0000 mg | ORAL_TABLET | Freq: Two times a day (BID) | ORAL | 0 refills | Status: DC

## 2015-12-25 MED ORDER — ALBUTEROL SULFATE HFA 108 (90 BASE) MCG/ACT IN AERS: 2.0000 | INHALATION_SPRAY | Freq: Four times a day (QID) | RESPIRATORY_TRACT | 1 refills | Status: DC | PRN

## 2015-12-25 MED ORDER — IPRATROPIUM BROMIDE 0.02 % IN SOLN
0.5000 mg | Freq: Once | RESPIRATORY_TRACT | Status: AC
  Administered 2015-12-25: 0.5 mg via RESPIRATORY_TRACT

## 2015-12-25 MED ORDER — ALBUTEROL SULFATE (2.5 MG/3ML) 0.083% IN NEBU
2.5000 mg | INHALATION_SOLUTION | Freq: Once | RESPIRATORY_TRACT | Status: AC
  Administered 2015-12-25: 2.5 mg via RESPIRATORY_TRACT

## 2015-12-25 MED ORDER — ALBUTEROL SULFATE (2.5 MG/3ML) 0.083% IN NEBU: 2.5000 mg | INHALATION_SOLUTION | Freq: Four times a day (QID) | RESPIRATORY_TRACT | 12 refills | Status: DC | PRN

## 2015-12-25 NOTE — Progress Notes (Signed)
   [name] is a 30 y.o. female who presents to [facility] today for wheezing:  1.  Asthma exacerbation:  Patient with long-standing history of asthma and smoking of 4-6 cigarettes a day who presents with one-day history of increasing dyspnea and cough. She states this started last night. She had does not have an albuterol inhaler at home and called this morning to see if she can have this refilled. Because she has not been seen in a while and because of her symptoms she was encouraged to come in to be evaluated.  She has had some mild sinus congestion and pressure. Otherwise denies any URI symptoms. She states she has a strong history of seasonal allergies both in the spring and fall. She previously was taking cetirizine for this and now is taking hydroxyzine with relief of her symptoms. This is prescribed by her psychiatrist also for tremors.  No chest pain. No lower extremity edema.  No recent travel.  ROS as above.    PMH reviewed. Patient is a nonsmoker.   Past Medical History:  Diagnosis Date  . Allergy   . Asthma    No past surgical history on file.  Medications reviewed.    Physical Exam:  BP 110/74   Pulse 90   Temp 98.2 F (36.8 C) (Oral)   Resp 20   Ht 5\' 3"  (1.6 m)   Wt 211 lb (95.7 kg)   SpO2 97%   BMI 37.38 kg/m  Gen:  Patient sitting on exam table, appears stated age in no acute distress Head: Normocephalic atraumatic Eyes: EOMI, PERRL, sclera and conjunctiva non-erythematous Nose:  Some swelling nasal turbinates bilaterally. Mouth: Mucosa membranes moist. Tonsils +2, nonenlarged, non-erythematous. Neck: No cervical lymphadenopathy noted Heart:  RRR, no murmurs auscultated. Pulm:  Diffuse wheezing in all lung fields. Some decreased air movement bilateral bases.   Assessment and Plan:  1.  Asthma exacerbation:  - Nebulizer treatment of both albuterol and Atrovent provided here today. -Repeat lung exam revealed: -We'll treat  with albuterol inhaler plus prednisone +7 days worth of doxycycline for this asthma exacerbation. -Follow-up in 4-6 weeks for PFTs. Sounds like she likely has undiagnosed persistent asthma and would do well with a controller medication - Follow-up more quickly in the next several days if no improvement with treatment.

## 2015-12-25 NOTE — Patient Instructions (Addendum)
  You have had an asthma exacerbation. This is likely triggered by the change in weather and possibly sinus congestion.  Usual albuterol inhaler every 4-6 hours for shortness of breath. Also make sure to use this before you  goes to sleep at night.  Also use the doxycycline once in the morning with evening for the next 7 days.  Use the inhaler for the next 3-4 days.. After that you can use as needed  If you start having worsening trouble breathing make sure to come back or go to the emergency room immediately.  IF you received an x-ray today, you will receive an invoice from [facility]. Please contact [facility] at [phone] with questions or concerns regarding your invoice.   IF you received labwork today, you will receive an invoice from [employer]. Please contact [facility] at [phone] with questions or concerns regarding your invoice.   Our billing staff will not be able to assist you with questions regarding bills from these companies.  You will be contacted with the lab results as soon as they are available. The fastest way to get your results is to activate your My Chart account. Instructions are located on the last page of this paperwork. If you have not heard from [place] regarding the results in 2 weeks, please contact this office.

## 2016-01-24 ENCOUNTER — Ambulatory Visit (INDEPENDENT_AMBULATORY_CARE_PROVIDER_SITE_OTHER): Payer: 59 | Admitting: Emergency Medicine

## 2016-01-24 VITALS — BP 128/80 | HR 96 | Temp 98.0°F | Resp 18 | Ht 63.0 in | Wt 204.0 lb

## 2016-01-24 DIAGNOSIS — J0100 Acute maxillary sinusitis, unspecified: Secondary | ICD-10-CM

## 2016-01-24 MED ORDER — AMOXICILLIN-POT CLAVULANATE 875-125 MG PO TABS: 1.0000 | ORAL_TABLET | Freq: Two times a day (BID) | ORAL | 0 refills | Status: DC

## 2016-01-24 NOTE — Patient Instructions (Addendum)
   IF you received an x-ray today, you will receive an invoice from [facility]. Please contact [facility] at [phone] with questions or concerns regarding your invoice.   IF you received labwork today, you will receive an invoice from [facility]. Please contact [facility] at [phone] with questions or concerns regarding your invoice.   Our billing staff will not be able to assist you with questions regarding bills from these companies.  You will be contacted with the lab results as soon as they are available. The fastest way to get your results is to activate your My Chart account. Instructions are located on the last page of this paperwork. If you have not heard from us regarding the results in 2 weeks, please contact this office.      Sinusitis, Adult Sinusitis is soreness and inflammation of your sinuses. Sinuses are hollow spaces in the bones around your face. Your sinuses are located:  Around your eyes.  In the middle of your forehead.  Behind your nose.  In your cheekbones. Your sinuses and nasal passages are lined with a stringy fluid (mucus). Mucus normally drains out of your sinuses. When your nasal tissues become inflamed or swollen, the mucus can become trapped or blocked so air cannot flow through your sinuses. This allows bacteria, viruses, and funguses to grow, which leads to infection. Sinusitis can develop quickly and last for 7?10 days (acute) or for more than 12 weeks (chronic). Sinusitis often develops after a cold. What are the causes? This condition is caused by anything that creates swelling in the sinuses or stops mucus from draining, including:  Allergies.  Asthma.  Bacterial or viral infection.  Abnormally shaped bones between the nasal passages.  Nasal growths that contain mucus (nasal polyps).  Narrow sinus openings.  Pollutants, such as chemicals or irritants in the air.  A foreign object stuck in the nose.  A fungal  infection. This is rare. What increases the risk? The following factors may make you more likely to develop this condition:  Having allergies or asthma.  Having had a recent cold or respiratory tract infection.  Having structural deformities or blockages in your nose or sinuses.  Having a weak immune system.  Doing a lot of swimming or diving.  Overusing nasal sprays.  Smoking. What are the signs or symptoms? The main symptoms of this condition are pain and a feeling of pressure around the affected sinuses. Other symptoms include:  Upper toothache.  Earache.  Headache.  Bad breath.  Decreased sense of smell and taste.  A cough that may get worse at night.  Fatigue.  Fever.  Thick drainage from your nose. The drainage is often green and it may contain pus (purulent).  Stuffy nose or congestion.  Postnasal drip. This is when extra mucus collects in the throat or back of the nose.  Swelling and warmth over the affected sinuses.  Sore throat.  Sensitivity to light. How is this diagnosed? This condition is diagnosed based on symptoms, a medical history, and a physical exam. To find out if your condition is acute or chronic, your health care provider may:  Look in your nose for signs of nasal polyps.  Tap over the affected sinus to check for signs of infection.  View the inside of your sinuses using an imaging device that has a light attached (endoscope). If your health care provider suspects that you have chronic sinusitis, you may also:  Be tested for allergies.  Have a sample of   mucus taken from your nose (nasal culture) and checked for bacteria.  Have a mucus sample examined to see if your sinusitis is related to an allergy. If your sinusitis does not respond to treatment and it lasts longer than 8 weeks, you may have an MRI or CT scan to check your sinuses. These scans also help to determine how severe your infection is. In rare cases, a bone biopsy may  be done to rule out more serious types of fungal sinus disease. How is this treated? Treatment for sinusitis depends on the cause and whether your condition is chronic or acute. If a virus is causing your sinusitis, your symptoms will go away on their own within 10 days. You may be given medicines to relieve your symptoms, including:  Topical nasal decongestants. They shrink swollen nasal passages and let mucus drain from your sinuses.  Antihistamines. These drugs block inflammation that is triggered by allergies. This can help to ease swelling in your nose and sinuses.  Topical nasal corticosteroids. These are nasal sprays that ease inflammation and swelling in your nose and sinuses.  Nasal saline washes. These rinses can help to get rid of thick mucus in your nose. If your condition is caused by bacteria, you will be given an antibiotic medicine. If your condition is caused by a fungus, you will be given an antifungal medicine. Surgery may be needed to correct underlying conditions, such as narrow nasal passages. Surgery may also be needed to remove polyps. Follow these instructions at home: Medicines   Take, use, or apply over-the-counter and prescription medicines only as told by your health care provider. These may include nasal sprays.  If you were prescribed an antibiotic medicine, take it as told by your health care provider. Do not stop taking the antibiotic even if you start to feel better. Hydrate and Humidify   Drink enough water to keep your urine clear or pale yellow. Staying hydrated will help to thin your mucus.  Use a cool mist humidifier to keep the humidity level in your home above 50%.  Inhale steam for 10-15 minutes, 3-4 times a day or as told by your health care provider. You can do this in the bathroom while a hot shower is running.  Limit your exposure to cool or dry air. Rest   Rest as much as possible.  Sleep with your head raised (elevated).  Make sure to  get enough sleep each night. General instructions   Apply a warm, moist washcloth to your face 3-4 times a day or as told by your health care provider. This will help with discomfort.  Wash your hands often with soap and water to reduce your exposure to viruses and other germs. If soap and water are not available, use hand sanitizer.  Do not smoke. Avoid being around people who are smoking (secondhand smoke).  Keep all follow-up visits as told by your health care provider. This is important. Contact a health care provider if:  You have a fever.  Your symptoms get worse.  Your symptoms do not improve within 10 days. Get help right away if:  You have a severe headache.  You have persistent vomiting.  You have pain or swelling around your face or eyes.  You have vision problems.  You develop confusion.  Your neck is stiff.  You have trouble breathing. This information is not intended to replace advice given to you by your health care provider. Make sure you discuss any questions you have with   your health care provider. Document Released: 01/19/2005 Document Revised: 09/15/2015 Document Reviewed: 11/14/2014 Elsevier Interactive Patient Education  2017 [employer] Inc[employer]  

## 2016-01-24 NOTE — Progress Notes (Signed)
[name] 30 y.o.   Chief Complaint  Patient presents with  . Sore Throat  . Sinusitis    drainage    HISTORY OF PRESENT ILLNESS: This is a 30 y.o. female   Sinusitis  This is a new problem. The current episode started 1 to 4 weeks ago. The problem has been gradually worsening since onset. There has been no fever. Her pain is at a severity of 2/10. The pain is mild. Associated symptoms include congestion, coughing, sinus pressure and a sore throat. Pertinent negatives include no chills, ear pain, headaches, neck pain, shortness of breath or swollen glands. Past treatments include nothing.     Prior to Admission medications   Medication Sig Start Date End Date Taking? Authorizing Provider  albuterol (PROVENTIL HFA;VENTOLIN HFA) 108 (90 Base) MCG/ACT inhaler Inhale 2 puffs into the lungs every 6 (six) hours as needed for wheezing or shortness of breath. 12/25/15  Yes Alveda Reasons, MD  albuterol (PROVENTIL) (2.5 MG/3ML) 0.083% nebulizer solution Take 3 mLs (2.5 mg total) by nebulization every 6 (six) hours as needed for wheezing or shortness of breath. 12/25/15  Yes Alveda Reasons, MD  FLUoxetine (PROZAC) 20 MG capsule Take 3 capsules (60 mg total) by mouth daily. 05/01/14  Yes Wendie Agreste, MD  gabapentin (NEURONTIN) 300 MG capsule Take 1 capsule (300 mg total) by mouth 3 (three) times daily. 05/01/14  Yes Wendie Agreste, MD  hydrOXYzine (ATARAX/VISTARIL) 50 MG tablet Take 50 mg by mouth 3 (three) times daily as needed.   Yes Historical Provider, MD  norethindrone-ethinyl estradiol (OVCON-50) 1-50 MG-MCG tablet Take 1 tablet by mouth daily.   Yes Historical Provider, MD  paliperidone (INVEGA) 6 MG 24 hr tablet Take 1 tablet (6 mg total) by mouth daily. 05/01/14  Yes Wendie Agreste, MD  amoxicillin-clavulanate (AUGMENTIN) 875-125 MG tablet Take 1 tablet by mouth 2 (two) times daily. 01/24/16   Horald Pollen, MD    Allergies  Allergen Reactions  . Cogentin  [Benztropine]     Patient Active Problem List   Diagnosis Date Noted  . Mood disorder ([place]) 03/04/2013    Past Medical History:  Diagnosis Date  . Allergy   . Asthma     History reviewed. No pertinent surgical history.  Social History   Social History  . Marital status: Single    Spouse name: N/A  . Number of children: N/A  . Years of education: N/A   Occupational History  . Not on file.   Social History Main Topics  . Smoking status: Current Every Day Smoker  . Smokeless tobacco: Never Used  . Alcohol use No  . Drug use: No  . Sexual activity: Yes   Other Topics Concern  . Not on file   Social History Narrative  . No narrative on file    History reviewed. No pertinent family history.   Review of Systems  Constitutional: Negative.  Negative for chills.  HENT: Positive for congestion, sinus pain, sinus pressure and sore throat. Negative for ear discharge, ear pain, nosebleeds and tinnitus.   Eyes: Negative.   Respiratory: Positive for cough, hemoptysis and sputum production. Negative for shortness of breath and wheezing.   Cardiovascular: Negative.   Gastrointestinal: Negative.  Negative for abdominal pain, diarrhea, nausea and vomiting.  Genitourinary: Negative.   Musculoskeletal: Negative.  Negative for neck pain.  Skin: Negative.   Neurological: Negative.  Negative for headaches.  Endo/Heme/Allergies: Negative.   Psychiatric/Behavioral: Negative.  Vitals:   01/24/16 1223  BP: 128/80  Pulse: 96  Resp: 18  Temp: 98 F (36.7 C)     Physical Exam  Constitutional: She is oriented to person, place, and time. She appears well-developed and well-nourished.  HENT:  Head: Normocephalic and atraumatic.  Right Ear: External ear normal.  Left Ear: External ear normal.  Nose: Nose normal.  Mouth/Throat: Oropharynx is clear and moist.  Eyes: Conjunctivae and EOM are normal. Pupils are equal, round, and reactive to light.  Neck: Normal range of motion.  Neck supple.  Cardiovascular: Normal rate, regular rhythm and intact distal pulses.   Pulmonary/Chest: Effort normal and breath sounds normal.  Abdominal: Soft. Bowel sounds are normal.  Musculoskeletal: Normal range of motion.  Neurological: She is alert and oriented to person, place, and time.  Skin: Skin is warm and dry.  Psychiatric: She has a normal mood and affect. Her behavior is normal.     ASSESSMENT & PLAN: [name] was seen today for sore throat and sinusitis.  Diagnoses and all orders for this visit:  Acute non-recurrent maxillary sinusitis -     Care order/instruction:  Other orders -     amoxicillin-clavulanate (AUGMENTIN) 875-125 MG tablet; Take 1 tablet by mouth 2 (two) times daily.   Sinusitis instructions given.F/U as neeeded.   Agustina Caroli, MD [facility]

## 2016-01-28 ENCOUNTER — Ambulatory Visit (INDEPENDENT_AMBULATORY_CARE_PROVIDER_SITE_OTHER): Payer: 59 | Admitting: Emergency Medicine

## 2016-01-28 VITALS — BP 136/84 | HR 130 | Temp 98.2°F | Resp 24 | Ht 63.0 in | Wt 202.6 lb

## 2016-01-28 DIAGNOSIS — R059 Cough, unspecified: Secondary | ICD-10-CM | POA: Insufficient documentation

## 2016-01-28 DIAGNOSIS — T3695XA Adverse effect of unspecified systemic antibiotic, initial encounter: Secondary | ICD-10-CM | POA: Diagnosis not present

## 2016-01-28 DIAGNOSIS — J069 Acute upper respiratory infection, unspecified: Secondary | ICD-10-CM | POA: Diagnosis not present

## 2016-01-28 DIAGNOSIS — K521 Toxic gastroenteritis and colitis: Secondary | ICD-10-CM | POA: Insufficient documentation

## 2016-01-28 DIAGNOSIS — R05 Cough: Secondary | ICD-10-CM | POA: Diagnosis not present

## 2016-01-28 MED ORDER — PROMETHAZINE-CODEINE 6.25-10 MG/5ML PO SYRP: 5.0000 mL | ORAL_SOLUTION | Freq: Three times a day (TID) | ORAL | 0 refills | Status: DC | PRN

## 2016-01-28 MED ORDER — PREDNISONE 20 MG PO TABS: 40.0000 mg | ORAL_TABLET | Freq: Every day | ORAL | 0 refills | Status: DC

## 2016-01-28 MED ORDER — PROMETHAZINE-DM 6.25-15 MG/5ML PO SYRP: 5.0000 mL | ORAL_SOLUTION | Freq: Four times a day (QID) | ORAL | 0 refills | Status: AC | PRN

## 2016-01-28 MED ORDER — BENZONATATE 200 MG PO CAPS: 200.0000 mg | ORAL_CAPSULE | Freq: Two times a day (BID) | ORAL | 0 refills | Status: DC | PRN

## 2016-01-28 NOTE — Addendum Note (Signed)
Addended by: Davina Poke on: 01/28/2016 03:03 PM   Modules accepted: Orders

## 2016-01-28 NOTE — Progress Notes (Addendum)
[name] 30 y.o.   Chief Complaint  Patient presents with  . Follow-up  . Diarrhea  . Fever    With chills, and body aches  . Anxiety    HISTORY OF PRESENT ILLNESS: This is a 30 y.o. female complaining of persistent symptoms x 1 week; c/o cough that's making her anxious and unable to sleep.  Diarrhea   This is a new problem. The current episode started in the past 7 days (since starting antibiotic). The problem occurs 2 to 4 times per day. The problem has been gradually worsening. The patient states that diarrhea does not awaken her from sleep. Associated symptoms include coughing, a fever and headaches. Pertinent negatives include no arthralgias, myalgias or vomiting. Exacerbated by: antibiotic. She has tried nothing for the symptoms.  Fever   This is a new problem. The current episode started in the past 7 days. The problem occurs intermittently. The problem has been waxing and waning. Associated symptoms include congestion, coughing, diarrhea, headaches, a sore throat and wheezing. Pertinent negatives include no chest pain, nausea or vomiting.  Cough  This is a new problem. The current episode started in the past 7 days. The problem has been gradually worsening. The problem occurs constantly. The cough is non-productive. Associated symptoms include a fever, headaches, a sore throat and wheezing. Pertinent negatives include no chest pain, hemoptysis, myalgias or shortness of breath.     Prior to Admission medications   Medication Sig Start Date End Date Taking? Authorizing Provider  albuterol (PROVENTIL HFA;VENTOLIN HFA) 108 (90 Base) MCG/ACT inhaler Inhale 2 puffs into the lungs every 6 (six) hours as needed for wheezing or shortness of breath. 12/25/15  Yes Alveda Reasons, MD  albuterol (PROVENTIL) (2.5 MG/3ML) 0.083% nebulizer solution Take 3 mLs (2.5 mg total) by nebulization every 6 (six) hours as needed for wheezing or shortness of breath. 12/25/15  Yes Alveda Reasons,  MD  amoxicillin-clavulanate (AUGMENTIN) 875-125 MG tablet Take 1 tablet by mouth 2 (two) times daily. 01/24/16  Yes Effa Yarrow Joellen Jersey, MD  FLUoxetine (PROZAC) 20 MG capsule Take 3 capsules (60 mg total) by mouth daily. 05/01/14  Yes Wendie Agreste, MD  gabapentin (NEURONTIN) 300 MG capsule Take 1 capsule (300 mg total) by mouth 3 (three) times daily. 05/01/14  Yes Wendie Agreste, MD  hydrOXYzine (ATARAX/VISTARIL) 50 MG tablet Take 50 mg by mouth 3 (three) times daily as needed.   Yes Historical Provider, MD  norethindrone-ethinyl estradiol (OVCON-50) 1-50 MG-MCG tablet Take 1 tablet by mouth daily.   Yes Historical Provider, MD  paliperidone (INVEGA) 6 MG 24 hr tablet Take 1 tablet (6 mg total) by mouth daily. 05/01/14  Yes Wendie Agreste, MD  benzonatate (TESSALON) 200 MG capsule Take 1 capsule (200 mg total) by mouth 2 (two) times daily as needed for cough. 01/28/16   Liyah Higham Joellen Jersey, MD  promethazine-codeine [facility] WITH CODEINE) 6.25-10 MG/5ML syrup Take 5 mLs by mouth every 8 (eight) hours as needed for cough. 01/28/16 01/31/16  Horald Pollen, MD    Allergies  Allergen Reactions  . Cogentin [Benztropine]     Patient Active Problem List   Diagnosis Date Noted  . Mood disorder ([place]) 03/04/2013    Past Medical History:  Diagnosis Date  . Allergy   . Asthma     No past surgical history on file.  Social History   Social History  . Marital status: Single    Spouse name: N/A  . Number of  children: N/A  . Years of education: N/A   Occupational History  . Not on file.   Social History Main Topics  . Smoking status: Current Every Day Smoker  . Smokeless tobacco: Never Used  . Alcohol use No  . Drug use: No  . Sexual activity: Yes   Other Topics Concern  . Not on file   Social History Narrative  . No narrative on file    No family history on file.   Review of Systems  Constitutional: Positive for fever and malaise/fatigue.  HENT: Positive for  congestion and sore throat. Negative for ear discharge and nosebleeds.   Eyes: Negative.   Respiratory: Positive for cough and wheezing. Negative for hemoptysis and shortness of breath.   Cardiovascular: Negative for chest pain, palpitations and leg swelling.  Gastrointestinal: Positive for diarrhea. Negative for blood in stool, constipation, melena, nausea and vomiting.  Genitourinary: Negative.   Musculoskeletal: Negative.  Negative for arthralgias and myalgias.  Skin: Negative.   Neurological: Positive for weakness and headaches. Negative for focal weakness.  Endo/Heme/Allergies: Negative.   Psychiatric/Behavioral: Negative for depression, memory loss, substance abuse and suicidal ideas. The patient is nervous/anxious.      Physical Exam  Constitutional: She is oriented to person, place, and time. She appears well-developed and well-nourished.  Anxious and teary  HENT:  Head: Normocephalic and atraumatic.  Nose: Nose normal.  Mouth/Throat: Oropharynx is clear and moist.  Eyes: Conjunctivae and EOM are normal. Pupils are equal, round, and reactive to light.  Neck: Normal range of motion. Neck supple.  Cardiovascular: Normal rate, regular rhythm, normal heart sounds and intact distal pulses.   Pulmonary/Chest: Effort normal. She has wheezes (mild and intermittent).  Abdominal: Soft. Bowel sounds are normal. There is no tenderness.  Musculoskeletal: Normal range of motion.  Neurological: She is alert and oriented to person, place, and time.  Skin: Skin is warm and dry.     ASSESSMENT & PLAN: [name] was seen today for follow-up, diarrhea, fever and anxiety.  Diagnoses and all orders for this visit:  Acute upper respiratory infection -     CBC with Differential/Platelet -     Comprehensive metabolic panel  Cough  Other orders -     benzonatate (TESSALON) 200 MG capsule; Take 1 capsule (200 mg total) by mouth 2 (two) times daily as needed for cough. -     promethazine-codeine  (PHENERGAN WITH CODEINE) 6.25-10 MG/5ML syrup; Take 5 mLs by mouth every 8 (eight) hours as needed for cough. -     predniSONE (DELTASONE) 20 MG tablet; Take 2 tablets (40 mg total) by mouth daily with breakfast.   Pt declined both Prednisone and Phenergan with codeine.Will prescribe Promethazine-DM instead.  3. Antibiotic-associated diarrhea Secondary to Augmentin.  Pt reassured as to the nature and etiology of her condition; viral most likely. Will stop Augmentin because of the diarrhea and treat symptomatically.   Agustina Caroli, MD [facility]

## 2016-01-28 NOTE — Patient Instructions (Addendum)
IF you received an x-ray today, you will receive an invoice from [facility]. Please contact [facility] at [phone] with questions or concerns regarding your invoice.   IF you received labwork today, you will receive an invoice from [place] [facility]. Please contact [facility] at [phone] with questions or concerns regarding your invoice.   Our billing staff will not be able to assist you with questions regarding bills from these companies.  You will be contacted with the lab results as soon as they are available. The fastest way to get your results is to activate your My Chart account. Instructions are located on the last page of this paperwork. If you have not heard from [place] regarding the results in 2 weeks, please contact this office.     Upper Respiratory Infection, Adult Most upper respiratory infections (URIs) are a viral infection of the air passages leading to the lungs. A URI affects the nose, throat, and upper air passages. The most common type of URI is nasopharyngitis and is typically referred to as "the common cold." URIs run their course and usually go away on their own. Most of the time, a URI does not require medical attention, but sometimes a bacterial infection in the upper airways can follow a viral infection. This is called a secondary infection. Sinus and middle ear infections are common types of secondary upper respiratory infections. Bacterial pneumonia can also complicate a URI. A URI can worsen asthma and chronic obstructive pulmonary disease (COPD). Sometimes, these complications can require emergency medical care and may be life threatening. What are the causes? Almost all URIs are caused by viruses. A virus is a type of germ and can spread from one person to another. What increases the risk? You may be at risk for a URI if:  You smoke.  You have chronic heart or lung disease.  You have a weakened defense (immune) system.  You are very young  or very old.  You have nasal allergies or asthma.  You work in crowded or poorly ventilated areas.  You work in health care facilities or schools. What are the signs or symptoms? Symptoms typically develop 2-3 days after you come in contact with a cold virus. Most viral URIs last 7-10 days. However, viral URIs from the influenza virus (flu virus) can last 14-18 days and are typically more severe. Symptoms may include:  Runny or stuffy (congested) nose.  Sneezing.  Cough.  Sore throat.  Headache.  Fatigue.  Fever.  Loss of appetite.  Pain in your forehead, behind your eyes, and over your cheekbones (sinus pain).  Muscle aches. How is this diagnosed? Your health care provider may diagnose a URI by:  Physical exam.  Tests to check that your symptoms are not due to another condition such as:  Strep throat.  Sinusitis.  Pneumonia.  Asthma. How is this treated? A URI goes away on its own with time. It cannot be cured with medicines, but medicines may be prescribed or recommended to relieve symptoms. Medicines may help:  Reduce your fever.  Reduce your cough.  Relieve nasal congestion. Follow these instructions at home:  Take medicines only as directed by your health care provider.  Gargle warm saltwater or take cough drops to comfort your throat as directed by your health care provider.  Use a warm mist humidifier or inhale steam from a shower to increase air moisture. This may make it easier to breathe.  Drink enough fluid to keep your urine clear or  pale yellow.  Eat soups and other clear broths and maintain good nutrition.  Rest as needed.  Return to work when your temperature has returned to normal or as your health care provider advises. You may need to stay home longer to avoid infecting others. You can also use a face mask and careful hand washing to prevent spread of the virus.  Increase the usage of your inhaler if you have asthma.  Do not use  any tobacco products, including cigarettes, chewing tobacco, or electronic cigarettes. If you need help quitting, ask your health care provider. How is this prevented? The best way to protect yourself from getting a cold is to practice good hygiene.  Avoid oral or hand contact with people with cold symptoms.  Wash your hands often if contact occurs. There is no clear evidence that vitamin C, vitamin E, echinacea, or exercise reduces the chance of developing a cold. However, it is always recommended to get plenty of rest, exercise, and practice good nutrition. Contact a health care provider if:  You are getting worse rather than better.  Your symptoms are not controlled by medicine.  You have chills.  You have worsening shortness of breath.  You have brown or red mucus.  You have yellow or brown nasal discharge.  You have pain in your face, especially when you bend forward.  You have a fever.  You have swollen neck glands.  You have pain while swallowing.  You have white areas in the back of your throat. Get help right away if:  You have severe or persistent:  Headache.  Ear pain.  Sinus pain.  Chest pain.  You have chronic lung disease and any of the following:  Wheezing.  Prolonged cough.  Coughing up blood.  A change in your usual mucus.  You have a stiff neck.  You have changes in your:  Vision.  Hearing.  Thinking.  Mood. This information is not intended to replace advice given to you by your health care provider. Make sure you discuss any questions you have with your health care provider. Document Released: 07/15/2000 Document Revised: 09/22/2015 Document Reviewed: 04/26/2013 Elsevier Interactive Patient Education  2017 [employer]. Stop Augmentin.

## 2016-01-29 LAB — CBC WITH DIFFERENTIAL/PLATELET
BASOS: 0 %
Basophils Absolute: 0 10*3/uL (ref 0.0–0.2)
EOS (ABSOLUTE): 0.2 10*3/uL (ref 0.0–0.4)
EOS: 2 %
HEMATOCRIT: 43.6 % (ref 34.0–46.6)
Hemoglobin: 15.2 g/dL (ref 11.1–15.9)
IMMATURE GRANS (ABS): 0 10*3/uL (ref 0.0–0.1)
IMMATURE GRANULOCYTES: 0 %
LYMPHS: 6 %
Lymphocytes Absolute: 0.8 10*3/uL (ref 0.7–3.1)
MCH: 30.6 pg (ref 26.6–33.0)
MCHC: 34.9 g/dL (ref 31.5–35.7)
MCV: 88 fL (ref 79–97)
Monocytes Absolute: 0.8 10*3/uL (ref 0.1–0.9)
Monocytes: 7 %
NEUTROS ABS: 10.6 10*3/uL — AB (ref 1.4–7.0)
NEUTROS PCT: 85 %
Platelets: 331 10*3/uL (ref 150–379)
RBC: 4.97 x10E6/uL (ref 3.77–5.28)
RDW: 12.7 % (ref 12.3–15.4)
WBC: 12.4 10*3/uL — ABNORMAL HIGH (ref 3.4–10.8)

## 2016-01-29 LAB — COMPREHENSIVE METABOLIC PANEL
A/G RATIO: 1.5 (ref 1.2–2.2)
ALBUMIN: 4.4 g/dL (ref 3.5–5.5)
ALT: 14 IU/L (ref 0–32)
AST: 16 IU/L (ref 0–40)
Alkaline Phosphatase: 101 IU/L (ref 39–117)
BILIRUBIN TOTAL: 0.5 mg/dL (ref 0.0–1.2)
BUN / CREAT RATIO: 5 — AB (ref 9–23)
BUN: 4 mg/dL — ABNORMAL LOW (ref 6–20)
CHLORIDE: 95 mmol/L — AB (ref 96–106)
CO2: 17 mmol/L — ABNORMAL LOW (ref 18–29)
Calcium: 9.1 mg/dL (ref 8.7–10.2)
Creatinine, Ser: 0.8 mg/dL (ref 0.57–1.00)
GFR, EST AFRICAN AMERICAN: 116 mL/min/{1.73_m2} (ref >59)
GFR, EST NON AFRICAN AMERICAN: 101 mL/min/{1.73_m2} (ref >59)
GLOBULIN, TOTAL: 2.9 g/dL (ref 1.5–4.5)
Glucose: 95 mg/dL (ref 65–99)
POTASSIUM: 4.2 mmol/L (ref 3.5–5.2)
SODIUM: 133 mmol/L — AB (ref 134–144)
TOTAL PROTEIN: 7.3 g/dL (ref 6.0–8.5)

## 2016-01-30 ENCOUNTER — Other Ambulatory Visit: Payer: Self-pay | Admitting: Family Medicine

## 2016-01-30 DIAGNOSIS — R059 Cough, unspecified: Secondary | ICD-10-CM

## 2016-01-30 DIAGNOSIS — R05 Cough: Secondary | ICD-10-CM

## 2016-03-13 ENCOUNTER — Encounter: Payer: Self-pay | Admitting: Family

## 2016-03-13 ENCOUNTER — Ambulatory Visit (INDEPENDENT_AMBULATORY_CARE_PROVIDER_SITE_OTHER): Payer: 59 | Admitting: Family

## 2016-03-13 VITALS — BP 110/80 | HR 87 | Temp 98.8°F | Resp 16 | Ht 63.0 in | Wt 212.8 lb

## 2016-03-13 DIAGNOSIS — L309 Dermatitis, unspecified: Secondary | ICD-10-CM | POA: Diagnosis not present

## 2016-03-13 DIAGNOSIS — F603 Borderline personality disorder: Secondary | ICD-10-CM | POA: Diagnosis not present

## 2016-03-13 DIAGNOSIS — J45909 Unspecified asthma, uncomplicated: Secondary | ICD-10-CM | POA: Insufficient documentation

## 2016-03-13 DIAGNOSIS — L2084 Intrinsic (allergic) eczema: Secondary | ICD-10-CM | POA: Insufficient documentation

## 2016-03-13 DIAGNOSIS — J4520 Mild intermittent asthma, uncomplicated: Secondary | ICD-10-CM | POA: Diagnosis not present

## 2016-03-13 MED ORDER — LEVALBUTEROL TARTRATE 45 MCG/ACT IN AERO: 1.0000 | INHALATION_SPRAY | Freq: Four times a day (QID) | RESPIRATORY_TRACT | 5 refills | Status: DC | PRN

## 2016-03-13 NOTE — Assessment & Plan Note (Signed)
Borderline personality disorder currently well maintained with medication regimen and no adverse side effects. Continue current dosage of Invega and fluoxetine. Continue follow-up and management per neuropsychiatry.

## 2016-03-13 NOTE — Assessment & Plan Note (Signed)
Currently maintained on over-the-counter medications as needed and symptoms are adequately controlled at this time. Continue to monitor.

## 2016-03-13 NOTE — Assessment & Plan Note (Signed)
Asthma appears mild and intermittent at this time with concern for possible progression to mild persistent given rescue inhaler use. Continue current dosage of Xopenex and albuterol nebulizers as needed. Consider additional inhaled corticosteroid if symptoms worsen or do not improve. Continue to monitor.

## 2016-03-13 NOTE — Progress Notes (Signed)
Subjective:    [name], female    DOB: 23-Jan-1986, 31 y.o.   MRN: [id number]  Chief Complaint  Patient presents with  . Establish Care    wants to talk about asthma and eczema    HPI:  [name] is a 31 y.o. female who  has a past medical history of Allergy; Asthma; and Borderline personality disorder. and presents today for an office visit to establish care.  1.) Asthma - Currently maintained on albuterol inhaler and nebulizer. Reports taken medication as prescribed and denies adverse side effects. Symptoms have worsened within the previous several weeks. She has recently quit smoking about 3 months ago. Currently uses the albuterol on average 1-2 times per week. No night time awakenings. Has not required any steroids. Symptoms are generally well controlled with the current regimen.   2.) Eczema - Recently diagnosed with eczema with rashes located on her elbows, knuckles and occasioanally inner thigh. Modifying factors includes OTC lotions and hydrocortisone cream which have helped with her symptoms. No spread that she is aware of. Described as red and occasionally itchy with small blisters on occasion.  3.) Borderline personality disorder - Currently maintained fluoxetine and paliperidone. Reports taking the medication as prescribed and denies adverse side effects. Symptoms are generally well controlled and her mood has been also stable. She is managed by neuropsychiatry Dr. Darleene Cleaver.   Allergies  Allergen Reactions  . Cogentin [Benztropine]       Outpatient Medications Prior to Visit  Medication Sig Dispense Refill  . albuterol (PROVENTIL) (2.5 MG/3ML) 0.083% nebulizer solution Take 3 mLs (2.5 mg total) by nebulization every 6 (six) hours as needed for wheezing or shortness of breath. 75 mL 12  . FLUoxetine (PROZAC) 20 MG capsule Take 3 capsules (60 mg total) by mouth daily. 90 capsule 2  . gabapentin (NEURONTIN) 300 MG capsule Take 1 capsule (300 mg  total) by mouth 3 (three) times daily. 90 capsule 2  . hydrOXYzine (ATARAX/VISTARIL) 50 MG tablet Take 50 mg by mouth 3 (three) times daily as needed.    . norethindrone-ethinyl estradiol (OVCON-50) 1-50 MG-MCG tablet Take 1 tablet by mouth daily.    . paliperidone (INVEGA) 6 MG 24 hr tablet Take 1 tablet (6 mg total) by mouth daily. 30 tablet 2  . albuterol (PROVENTIL HFA;VENTOLIN HFA) 108 (90 Base) MCG/ACT inhaler Inhale 2 puffs into the lungs every 6 (six) hours as needed for wheezing or shortness of breath. 1 Inhaler 1  . amoxicillin-clavulanate (AUGMENTIN) 875-125 MG tablet Take 1 tablet by mouth 2 (two) times daily. 20 tablet 0  . benzonatate (TESSALON) 200 MG capsule Take 1 capsule (200 mg total) by mouth 2 (two) times daily as needed for cough. 20 capsule 0   No facility-administered medications prior to visit.      Past Medical History:  Diagnosis Date  . Allergy   . Asthma   . Borderline personality disorder       Past Surgical History:  Procedure Laterality Date  . Thumb surgery Left       Family History  Problem Relation Age of Onset  . Healthy Mother   . Healthy Father   . Alzheimer's disease Maternal Grandmother   . Skin cancer Paternal Grandmother       Social History   Social History  . Marital status: Single    Spouse name: N/A  . Number of children: 0  . Years of education: 67   Occupational History  .  Not on file.   Social History Main Topics  . Smoking status: Former Smoker    Packs/day: 0.50    Years: 9.00    Quit date: 12/04/2015  . Smokeless tobacco: Never Used  . Alcohol use No  . Drug use: Yes    Types: Marijuana  . Sexual activity: Yes    Birth control/ protection: Pill   Other Topics Concern  . Not on file   Social History Narrative   Fun: Play video games and watch movies.   Denies abuse and feels safe at home.      Review of Systems  Constitutional: Negative for chills and fever.       Appearance is slightly unkempt.    Respiratory: Negative for chest tightness, shortness of breath and wheezing.   Cardiovascular: Negative for chest pain, palpitations and leg swelling.  Psychiatric/Behavioral: Negative for agitation, behavioral problems, confusion, decreased concentration, dysphoric mood, hallucinations, self-injury, sleep disturbance and suicidal ideas. The patient is not nervous/anxious and is not hyperactive.        Objective:    BP 110/80 (BP Location: Left Arm, Patient Position: Sitting, Cuff Size: Large)   Pulse 87   Temp 98.8 F (37.1 C) (Oral)   Resp 16   Ht 5\' 3"  (1.6 m)   Wt 212 lb 12.8 oz (96.5 kg)   SpO2 95%   BMI 37.70 kg/m  Nursing note and vital signs reviewed.  Physical Exam  Constitutional: She is oriented to person, place, and time. She appears well-developed and well-nourished. No distress.  HENT:  Right Ear: Hearing, tympanic membrane, external ear and ear canal normal.  Left Ear: Hearing, tympanic membrane, external ear and ear canal normal.  Nose: Nose normal.  Cardiovascular: Normal rate, regular rhythm, normal heart sounds and intact distal pulses.  Exam reveals no gallop and no friction rub.   No murmur heard. Pulmonary/Chest: Effort normal. No respiratory distress. She has wheezes. She has no rales. She exhibits no tenderness.  Neurological: She is alert and oriented to person, place, and time.  Skin: Skin is warm and dry.  Psychiatric: She has a normal mood and affect. Her behavior is normal. Judgment and thought content normal.        Assessment & Plan:   Problem List Items Addressed This Visit      Respiratory   Asthma - Primary    Asthma appears mild and intermittent at this time with concern for possible progression to mild persistent given rescue inhaler use. Continue current dosage of Xopenex and albuterol nebulizers as needed. Consider additional inhaled corticosteroid if symptoms worsen or do not improve. Continue to monitor.      Relevant Medications    levalbuterol (XOPENEX HFA) 45 MCG/ACT inhaler     Musculoskeletal and Integument   Eczema    Currently maintained on over-the-counter medications as needed and symptoms are adequately controlled at this time. Continue to monitor.        Other   Borderline personality disorder    Borderline personality disorder currently well maintained with medication regimen and no adverse side effects. Continue current dosage of Invega and fluoxetine. Continue follow-up and management per neuropsychiatry.          I have discontinued Ms. Koskinen's amoxicillin-clavulanate and benzonatate. I am also having her start on levalbuterol. Additionally, I am having her maintain her norethindrone-ethinyl estradiol, paliperidone, FLUoxetine, gabapentin, hydrOXYzine, and albuterol.   Meds ordered this encounter  Medications  . levalbuterol (XOPENEX HFA) 45 MCG/ACT inhaler  Sig: Inhale 1-2 puffs into the lungs every 6 (six) hours as needed for wheezing.    Dispense:  1 Inhaler    Refill:  5    Order Specific Question:   Supervising Provider    Answer:   Pricilla Holm A [id number]     Follow-up: Return in about 3 months (around 06/10/2016), or if symptoms worsen or fail to improve.  Mauricio Po, FNP

## 2016-03-13 NOTE — Patient Instructions (Signed)
Thank you for choosing [facility].  SUMMARY AND INSTRUCTIONS:  Please schedule a time for your physical at your convenience.   Medication:  Please continue to take your medications as prescribed.   Your prescription(s) have been submitted to your pharmacy or been printed and provided for you. Please take as directed and contact our office if you believe you are having problem(s) with the medication(s) or have any questions.  Follow up:  If your symptoms worsen or fail to improve, please contact our office for further instruction, or in case of emergency go directly to the emergency room at the closest medical facility.

## 2016-04-17 ENCOUNTER — Encounter: Payer: Self-pay | Admitting: Family

## 2016-04-17 ENCOUNTER — Ambulatory Visit (INDEPENDENT_AMBULATORY_CARE_PROVIDER_SITE_OTHER): Payer: 59 | Admitting: Family

## 2016-04-17 ENCOUNTER — Other Ambulatory Visit (INDEPENDENT_AMBULATORY_CARE_PROVIDER_SITE_OTHER): Payer: 59

## 2016-04-17 VITALS — BP 108/70 | HR 79 | Temp 98.3°F | Resp 16 | Ht 63.0 in | Wt 210.0 lb

## 2016-04-17 DIAGNOSIS — E6609 Other obesity due to excess calories: Secondary | ICD-10-CM | POA: Diagnosis not present

## 2016-04-17 DIAGNOSIS — Z0000 Encounter for general adult medical examination without abnormal findings: Secondary | ICD-10-CM

## 2016-04-17 DIAGNOSIS — Z6837 Body mass index (BMI) 37.0-37.9, adult: Secondary | ICD-10-CM | POA: Diagnosis not present

## 2016-04-17 DIAGNOSIS — Z23 Encounter for immunization: Secondary | ICD-10-CM

## 2016-04-17 DIAGNOSIS — J4520 Mild intermittent asthma, uncomplicated: Secondary | ICD-10-CM | POA: Diagnosis not present

## 2016-04-17 DIAGNOSIS — Z0001 Encounter for general adult medical examination with abnormal findings: Secondary | ICD-10-CM | POA: Insufficient documentation

## 2016-04-17 LAB — CBC
HCT: 45.2 % (ref 36.0–46.0)
HEMOGLOBIN: 14.9 g/dL (ref 12.0–15.0)
MCHC: 33.1 g/dL (ref 30.0–36.0)
MCV: 89.8 fl (ref 78.0–100.0)
PLATELETS: 327 10*3/uL (ref 150.0–400.0)
RBC: 5.03 Mil/uL (ref 3.87–5.11)
RDW: 13 % (ref 11.5–15.5)
WBC: 8.6 10*3/uL (ref 4.0–10.5)

## 2016-04-17 LAB — COMPREHENSIVE METABOLIC PANEL
ALK PHOS: 85 U/L (ref 39–117)
ALT: 11 U/L (ref 0–35)
AST: 15 U/L (ref 0–37)
Albumin: 4 g/dL (ref 3.5–5.2)
BUN: 7 mg/dL (ref 6–23)
CALCIUM: 9.1 mg/dL (ref 8.4–10.5)
CO2: 23 mEq/L (ref 19–32)
Chloride: 102 mEq/L (ref 96–112)
Creatinine, Ser: 0.83 mg/dL (ref 0.40–1.20)
GFR: 86.74 mL/min (ref >60.00)
GLUCOSE: 86 mg/dL (ref 70–99)
POTASSIUM: 4.3 meq/L (ref 3.5–5.1)
Sodium: 133 mEq/L — ABNORMAL LOW (ref 135–145)
TOTAL PROTEIN: 7.1 g/dL (ref 6.0–8.3)
Total Bilirubin: 0.5 mg/dL (ref 0.2–1.2)

## 2016-04-17 LAB — LIPID PANEL
CHOL/HDL RATIO: 3
Cholesterol: 185 mg/dL (ref 0–200)
HDL: 66.7 mg/dL (ref >39.00)
LDL CALC: 98 mg/dL (ref 0–99)
NONHDL: 118.12
TRIGLYCERIDES: 99 mg/dL (ref 0.0–149.0)
VLDL: 19.8 mg/dL (ref 0.0–40.0)

## 2016-04-17 LAB — TSH: TSH: 1.68 u[IU]/mL (ref 0.35–4.50)

## 2016-04-17 MED ORDER — FLUTICASONE PROPIONATE HFA 44 MCG/ACT IN AERO: 2.0000 | INHALATION_SPRAY | Freq: Two times a day (BID) | RESPIRATORY_TRACT | 2 refills | Status: DC

## 2016-04-17 NOTE — Assessment & Plan Note (Signed)
BMI of 37. Recommend weight loss of 5-10% of current body weight through nutrition and physical activity changes. Encourage nutritional intake that is 1200-1500 cal daily with the guidance of a calorie tracking application for smartphone/computer. Encouraged to investigate weight loss programs including [employer], [employer]ystem, or [employer]. BMI of 37 which qualifies for weight loss medication following attempts at lifestyle management. Recommend increasing physical activity gradually to 30 minutes of moderate level activity 2-3 times per week.

## 2016-04-17 NOTE — Patient Instructions (Addendum)
Thank you for choosing [facility].  SUMMARY AND INSTRUCTIONS:  Please work on increasing physical activity to goal of 30 minutes of moderate level activity 2-3 times per week.   Check on MyFitnessPal for calorie tracking.  Start the Flovent 2 puffs twice daily for your asthma and continue the albuterol as needed.  Medication:  Your prescription(s) have been submitted to your pharmacy or been printed and provided for you. Please take as directed and contact our office if you believe you are having problem(s) with the medication(s) or have any questions.  Labs:  Please stop by the lab on the lower level of the building for your blood work. Your results will be released to [place] (or called to you) after review, usually within 72 hours after test completion. If any changes need to be made, you will be notified at that same time.  1.) The lab is open from 7:30am to 5:30 pm Monday-Friday 2.) No appointment is necessary 3.) Fasting (if needed) is 6-8 hours after food and drink; black coffee and water are okay   Follow up:  If your symptoms worsen or fail to improve, please contact our office for further instruction, or in case of emergency go directly to the emergency room at the closest medical facility.    Health Maintenance, Female Adopting a healthy lifestyle and getting preventive care can go a long way to promote health and wellness. Talk with your health care provider about what schedule of regular examinations is right for you. This is a good chance for you to check in with your provider about disease prevention and staying healthy. In between checkups, there are plenty of things you can do on your own. Experts have done a lot of research about which lifestyle changes and preventive measures are most likely to keep you healthy. Ask your health care provider for more information. Weight and diet Eat a healthy diet  Be sure to include plenty of vegetables, fruits, low-fat  dairy products, and lean protein.  Do not eat a lot of foods high in solid fats, added sugars, or salt.  Get regular exercise. This is one of the most important things you can do for your health.  Most adults should exercise for at least 150 minutes each week. The exercise should increase your heart rate and make you sweat (moderate-intensity exercise).  Most adults should also do strengthening exercises at least twice a week. This is in addition to the moderate-intensity exercise. Maintain a healthy weight  Body mass index (BMI) is a measurement that can be used to identify possible weight problems. It estimates body fat based on height and weight. Your health care provider can help determine your BMI and help you achieve or maintain a healthy weight.  For females 26 years of age and older:  A BMI below 18.5 is considered underweight.  A BMI of 18.5 to 24.9 is normal.  A BMI of 25 to 29.9 is considered overweight.  A BMI of 30 and above is considered obese. Watch levels of cholesterol and blood lipids  You should start having your blood tested for lipids and cholesterol at 29 years of age, then have this test every 5 years.  You may need to have your cholesterol levels checked more often if:  Your lipid or cholesterol levels are high.  You are older than 29 years of age.  You are at high risk for heart disease. Cancer screening Lung Cancer  Lung cancer screening is recommended for adults  68-75 years old who are at high risk for lung cancer because of a history of smoking.  A yearly low-dose CT scan of the lungs is recommended for people who:  Currently smoke.  Have quit within the past 15 years.  Have at least a 30-pack-year history of smoking. A pack year is smoking an average of one pack of cigarettes a day for 1 year.  Yearly screening should continue until it has been 15 years since you quit.  Yearly screening should stop if you develop a health problem that would  prevent you from having lung cancer treatment. Breast Cancer  Practice breast self-awareness. This means understanding how your breasts normally appear and feel.  It also means doing regular breast self-exams. Let your health care provider know about any changes, no matter how small.  If you are in your 20s or 30s, you should have a clinical breast exam (CBE) by a health care provider every 1-3 years as part of a regular health exam.  If you are 32 or older, have a CBE every year. Also consider having a breast X-ray (mammogram) every year.  If you have a family history of breast cancer, talk to your health care provider about genetic screening.  If you are at high risk for breast cancer, talk to your health care provider about having an MRI and a mammogram every year.  Breast cancer gene (BRCA) assessment is recommended for women who have family members with BRCA-related cancers. BRCA-related cancers include:  Breast.  Ovarian.  Tubal.  Peritoneal cancers.  Results of the assessment will determine the need for genetic counseling and BRCA1 and BRCA2 testing. Cervical Cancer  Your health care provider may recommend that you be screened regularly for cancer of the pelvic organs (ovaries, uterus, and vagina). This screening involves a pelvic examination, including checking for microscopic changes to the surface of your cervix (Pap test). You may be encouraged to have this screening done every 3 years, beginning at age 58.  For women ages 43-65, health care providers may recommend pelvic exams and Pap testing every 3 years, or they may recommend the Pap and pelvic exam, combined with testing for human papilloma virus (HPV), every 5 years. Some types of HPV increase your risk of cervical cancer. Testing for HPV may also be done on women of any age with unclear Pap test results.  Other health care providers may not recommend any screening for nonpregnant women who are considered low risk for  pelvic cancer and who do not have symptoms. Ask your health care provider if a screening pelvic exam is right for you.  If you have had past treatment for cervical cancer or a condition that could lead to cancer, you need Pap tests and screening for cancer for at least 20 years after your treatment. If Pap tests have been discontinued, your risk factors (such as having a new sexual partner) need to be reassessed to determine if screening should resume. Some women have medical problems that increase the chance of getting cervical cancer. In these cases, your health care provider may recommend more frequent screening and Pap tests. Colorectal Cancer  This type of cancer can be detected and often prevented.  Routine colorectal cancer screening usually begins at 29 years of age and continues through 29 years of age.  Your health care provider may recommend screening at an earlier age if you have risk factors for colon cancer.  Your health care provider may also recommend using home test  kits to check for hidden blood in the stool.  A small camera at the end of a tube can be used to examine your colon directly (sigmoidoscopy or colonoscopy). This is done to check for the earliest forms of colorectal cancer.  Routine screening usually begins at age 58.  Direct examination of the colon should be repeated every 5-10 years through 29 years of age. However, you may need to be screened more often if early forms of precancerous polyps or small growths are found. Skin Cancer  Check your skin from head to toe regularly.  Tell your health care provider about any new moles or changes in moles, especially if there is a change in a mole's shape or color.  Also tell your health care provider if you have a mole that is larger than the size of a pencil eraser.  Always use sunscreen. Apply sunscreen liberally and repeatedly throughout the day.  Protect yourself by wearing long sleeves, pants, a wide-brimmed  hat, and sunglasses whenever you are outside. Heart disease, diabetes, and high blood pressure  High blood pressure causes heart disease and increases the risk of stroke. High blood pressure is more likely to develop in:  People who have blood pressure in the high end of the normal range (130-139/85-89 mm Hg).  People who are overweight or obese.  People who are African American.  If you are 44-18 years of age, have your blood pressure checked every 3-5 years. If you are 101 years of age or older, have your blood pressure checked every year. You should have your blood pressure measured twice-once when you are at a hospital or clinic, and once when you are not at a hospital or clinic. Record the average of the two measurements. To check your blood pressure when you are not at a hospital or clinic, you can use:  An automated blood pressure machine at a pharmacy.  A home blood pressure monitor.  If you are between 15 years and 30 years old, ask your health care provider if you should take aspirin to prevent strokes.  Have regular diabetes screenings. This involves taking a blood sample to check your fasting blood sugar level.  If you are at a normal weight and have a low risk for diabetes, have this test once every three years after 29 years of age.  If you are overweight and have a high risk for diabetes, consider being tested at a younger age or more often. Preventing infection Hepatitis B  If you have a higher risk for hepatitis B, you should be screened for this virus. You are considered at high risk for hepatitis B if:  You were born in a country where hepatitis B is common. Ask your health care provider which countries are considered high risk.  Your parents were born in a high-risk country, and you have not been immunized against hepatitis B (hepatitis B vaccine).  You have HIV or AIDS.  You use needles to inject street drugs.  You live with someone who has hepatitis B.  You  have had sex with someone who has hepatitis B.  You get hemodialysis treatment.  You take certain medicines for conditions, including cancer, organ transplantation, and autoimmune conditions. Hepatitis C  Blood testing is recommended for:  Everyone born from [birth year] through 1963.  Anyone with known risk factors for hepatitis C. Sexually transmitted infections (STIs)  You should be screened for sexually transmitted infections (STIs) including gonorrhea and chlamydia if:  You are sexually  active and are younger than 29 years of age.  You are older than 29 years of age and your health care provider tells you that you are at risk for this type of infection.  Your sexual activity has changed since you were last screened and you are at an increased risk for chlamydia or gonorrhea. Ask your health care provider if you are at risk.  If you do not have HIV, but are at risk, it may be recommended that you take a prescription medicine daily to prevent HIV infection. This is called pre-exposure prophylaxis (PrEP). You are considered at risk if:  You are sexually active and do not regularly use condoms or know the HIV status of your partner(s).  You take drugs by injection.  You are sexually active with a partner who has HIV. Talk with your health care provider about whether you are at high risk of being infected with HIV. If you choose to begin PrEP, you should first be tested for HIV. You should then be tested every 3 months for as long as you are taking PrEP. Pregnancy  If you are premenopausal and you may become pregnant, ask your health care provider about preconception counseling.  If you may become pregnant, take 400 to 800 micrograms (mcg) of folic acid every day.  If you want to prevent pregnancy, talk to your health care provider about birth control (contraception). Osteoporosis and menopause  Osteoporosis is a disease in which the bones lose minerals and strength with aging. This  can result in serious bone fractures. Your risk for osteoporosis can be identified using a bone density scan.  If you are 62 years of age or older, or if you are at risk for osteoporosis and fractures, ask your health care provider if you should be screened.  Ask your health care provider whether you should take a calcium or vitamin D supplement to lower your risk for osteoporosis.  Menopause may have certain physical symptoms and risks.  Hormone replacement therapy may reduce some of these symptoms and risks. Talk to your health care provider about whether hormone replacement therapy is right for you. Follow these instructions at home:  Schedule regular health, dental, and eye exams.  Stay current with your immunizations.  Do not use any tobacco products including cigarettes, chewing tobacco, or electronic cigarettes.  If you are pregnant, do not drink alcohol.  If you are breastfeeding, limit how much and how often you drink alcohol.  Limit alcohol intake to no more than 1 drink per day for nonpregnant women. One drink equals 12 ounces of beer, 5 ounces of wine, or 1 ounces of hard liquor.  Do not use street drugs.  Do not share needles.  Ask your health care provider for help if you need support or information about quitting drugs.  Tell your health care provider if you often feel depressed.  Tell your health care provider if you have ever been abused or do not feel safe at home. This information is not intended to replace advice given to you by your health care provider. Make sure you discuss any questions you have with your health care provider. Document Released: 08/04/2010 Document Revised: 06/27/2015 Document Reviewed: 10/23/2014 Elsevier Interactive Patient Education  2017 [employer].

## 2016-04-17 NOTE — Assessment & Plan Note (Signed)
Asthma appears uncontrolled with current dosage of albuterol and noted continued wheezing on physical exam today. Start Flovent. Continue current dosage of albuterol as needed. Encouraged to monitor for triggers. Follow-up in one month or sooner to determine effectiveness.

## 2016-04-17 NOTE — Progress Notes (Signed)
Subjective:    [name], female    DOB: 1985/06/20, 31 y.o.   MRN: [id number]  Chief Complaint  Patient presents with  . CPE    fasting    HPI:  [name] is a 31 y.o. female who presents today for an annual wellness visit.   1) Health Maintenance -   Diet - Averages about 1-2 meals per day consisting of a regular diet; Caffeine intake is occasional.   Exercise - No structured exercise   2) Preventative Exams / Immunizations:  Dental -- Due for exam   Vision -- Up to date   Health Maintenance  Topic Date Due  . HIV Screening  11/15/2002  . TETANUS/TDAP  11/15/2006  . PAP SMEAR  02/21/2019  . INFLUENZA VACCINE  Completed    Immunization History  Administered Date(s) Administered  . Influenza-Unspecified 10/04/2015  . Tdap 04/17/2016     Allergies  Allergen Reactions  . Cogentin [Benztropine]      Outpatient Medications Prior to Visit  Medication Sig Dispense Refill  . albuterol (PROVENTIL) (2.5 MG/3ML) 0.083% nebulizer solution Take 3 mLs (2.5 mg total) by nebulization every 6 (six) hours as needed for wheezing or shortness of breath. 75 mL 12  . FLUoxetine (PROZAC) 20 MG capsule Take 3 capsules (60 mg total) by mouth daily. 90 capsule 2  . gabapentin (NEURONTIN) 300 MG capsule Take 1 capsule (300 mg total) by mouth 3 (three) times daily. 90 capsule 2  . hydrOXYzine (ATARAX/VISTARIL) 50 MG tablet Take 50 mg by mouth 3 (three) times daily as needed.    . levalbuterol (XOPENEX HFA) 45 MCG/ACT inhaler Inhale 1-2 puffs into the lungs every 6 (six) hours as needed for wheezing. 1 Inhaler 5  . norethindrone-ethinyl estradiol (OVCON-50) 1-50 MG-MCG tablet Take 1 tablet by mouth daily.    . paliperidone (INVEGA) 6 MG 24 hr tablet Take 1 tablet (6 mg total) by mouth daily. 30 tablet 2   No facility-administered medications prior to visit.      Past Medical History:  Diagnosis Date  . Allergy   . Asthma   . Borderline personality  disorder      Past Surgical History:  Procedure Laterality Date  . Thumb surgery Left      Family History  Problem Relation Age of Onset  . Healthy Mother   . Healthy Father   . Alzheimer's disease Maternal Grandmother   . Skin cancer Paternal Grandmother      Social History   Social History  . Marital status: Single    Spouse name: N/A  . Number of children: 0  . Years of education: 56   Occupational History  . Not on file.   Social History Main Topics  . Smoking status: Former Smoker    Packs/day: 0.50    Years: 9.00    Quit date: 12/04/2015  . Smokeless tobacco: Never Used  . Alcohol use No  . Drug use: Yes    Frequency: 4.0 times per week    Types: Marijuana  . Sexual activity: Yes    Birth control/ protection: Pill   Other Topics Concern  . Not on file   Social History Narrative   Fun: Play video games and watch movies.   Denies abuse and feels safe at home.      Review of Systems  Constitutional: Denies fever, chills, fatigue, or significant weight gain/loss. HENT: Head: Denies headache or neck pain Ears: Denies changes in hearing,  ringing in ears, earache, drainage Nose: Denies discharge, stuffiness, itching, nosebleed, sinus pain Throat: Denies sore throat, hoarseness, dry mouth, sores, thrush Eyes: Denies loss/changes in vision, pain, redness, blurry/double vision, flashing lights Cardiovascular: Denies chest pain/discomfort, tightness, palpitations, shortness of breath with activity, difficulty lying down, swelling, sudden awakening with shortness of breath Respiratory: Denies shortness of breath, cough, sputum production, wheezing Gastrointestinal: Denies dysphasia, heartburn, change in appetite, nausea, change in bowel habits, rectal bleeding, constipation, diarrhea, yellow skin or eyes Genitourinary: Denies frequency, urgency, burning/pain, blood in urine, incontinence, change in urinary strength. Musculoskeletal: Denies muscle/joint pain,  stiffness, back pain, redness or swelling of joints, trauma Skin: Denies rashes, lumps, itching, dryness, color changes, or hair/nail changes Neurological: Denies dizziness, fainting, seizures, weakness, numbness, tingling, tremor Psychiatric - Denies nervousness, stress, depression or memory loss Endocrine: Denies heat or cold intolerance, sweating, frequent urination, excessive thirst, changes in appetite Hematologic: Denies ease of bruising or bleeding     Objective:    BP 108/70 (BP Location: Left Arm, Patient Position: Sitting, Cuff Size: Large)   Pulse 79   Temp 98.3 F (36.8 C) (Oral)   Resp 16   Ht 5\' 3"  (1.6 m)   Wt 210 lb (95.3 kg)   SpO2 94%   BMI 37.20 kg/m  Nursing note and vital signs reviewed.  Physical Exam  Constitutional: She is oriented to person, place, and time. She appears well-developed and well-nourished.  HENT:  Head: Normocephalic.  Right Ear: Hearing, tympanic membrane, external ear and ear canal normal.  Left Ear: Hearing, tympanic membrane, external ear and ear canal normal.  Nose: Nose normal.  Mouth/Throat: Uvula is midline, oropharynx is clear and moist and mucous membranes are normal.  Eyes: Conjunctivae and EOM are normal. Pupils are equal, round, and reactive to light.  Neck: Neck supple. No JVD present. No tracheal deviation present. No thyromegaly present.  Cardiovascular: Normal rate, regular rhythm, normal heart sounds and intact distal pulses.   Pulmonary/Chest: Effort normal and breath sounds normal.  Abdominal: Soft. Bowel sounds are normal. She exhibits no distension and no mass. There is no tenderness. There is no rebound and no guarding.  Musculoskeletal: Normal range of motion. She exhibits no edema or tenderness.  Lymphadenopathy:    She has no cervical adenopathy.  Neurological: She is alert and oriented to person, place, and time. She has normal reflexes. No cranial nerve deficit. She exhibits normal muscle tone. Coordination  normal.  Skin: Skin is warm and dry.  Psychiatric: She has a normal mood and affect. Her behavior is normal. Judgment and thought content normal.       Assessment & Plan:   Problem List Items Addressed This Visit      Respiratory   Asthma    Asthma appears uncontrolled with current dosage of albuterol and noted continued wheezing on physical exam today. Start Flovent. Continue current dosage of albuterol as needed. Encouraged to monitor for triggers. Follow-up in one month or sooner to determine effectiveness.      Relevant Medications   fluticasone (FLOVENT HFA) 44 MCG/ACT inhaler     Other   Encounter for general adult medical examination with abnormal findings - Primary    1) Anticipatory Guidance: Discussed importance of wearing a seatbelt while driving and not texting while driving; changing batteries in smoke detector at least once annually; wearing suntan lotion when outside; eating a balanced and moderate diet; getting physical activity at least 30 minutes per day.  2) Immunizations / Screenings / Labs:  Tetanus updated today. All other immunizations are up-to-date per recommendations. Due for a dental screen encouraged to be completed independently. All other screenings are up-to-date per recommendations. Obtain CBC, CMET, and lipid profile.    Overall well exam with risk factors for cardiovascular disease including obesity and sedentary lifestyle. Recommend weight loss of 5-10% of current body weight through nutrition and physical activity changes. Goal is to increase physical activity to 30 minutes of moderate level activity daily. Nutritional intake that is balanced, varied, and moderate. Continue other healthy lifestyle behaviors and choices. Follow-up prevention exam in 1 year. Follow-up office visit pending blood work.      Relevant Orders   Comprehensive metabolic panel (Completed)   CBC (Completed)   Lipid panel (Completed)   TSH (Completed)   Class 2 obesity due to  excess calories without serious comorbidity with body mass index (BMI) of 37.0 to 37.9 in adult    BMI of 37. Recommend weight loss of 5-10% of current body weight through nutrition and physical activity changes. Encourage nutritional intake that is 1200-1500 cal daily with the guidance of a calorie tracking application for smartphone/computer. Encouraged to investigate weight loss programs including [employer], [employer]ystem, or [employer]. BMI of 37 which qualifies for weight loss medication following attempts at lifestyle management. Recommend increasing physical activity gradually to 30 minutes of moderate level activity 2-3 times per week.       Other Visit Diagnoses    Need for Tdap vaccination       Relevant Orders   Tdap vaccine greater than or equal to 7yo IM (Completed)       I am having Ms. Pranger start on fluticasone. I am also having her maintain her norethindrone-ethinyl estradiol, paliperidone, FLUoxetine, gabapentin, hydrOXYzine, albuterol, and levalbuterol.   Meds ordered this encounter  Medications  . fluticasone (FLOVENT HFA) 44 MCG/ACT inhaler    Sig: Inhale 2 puffs into the lungs 2 (two) times daily.    Dispense:  1 Inhaler    Refill:  2    Order Specific Question:   Supervising Provider    Answer:   Pricilla Holm A [[id number]]     Follow-up: Return if symptoms worsen or fail to improve.   Mauricio Po, FNP

## 2016-04-17 NOTE — Assessment & Plan Note (Signed)
1) Anticipatory Guidance: Discussed importance of wearing a seatbelt while driving and not texting while driving; changing batteries in smoke detector at least once annually; wearing suntan lotion when outside; eating a balanced and moderate diet; getting physical activity at least 30 minutes per day.  2) Immunizations / Screenings / Labs:  Tetanus updated today. All other immunizations are up-to-date per recommendations. Due for a dental screen encouraged to be completed independently. All other screenings are up-to-date per recommendations. Obtain CBC, CMET, and lipid profile.    Overall well exam with risk factors for cardiovascular disease including obesity and sedentary lifestyle. Recommend weight loss of 5-10% of current body weight through nutrition and physical activity changes. Goal is to increase physical activity to 30 minutes of moderate level activity daily. Nutritional intake that is balanced, varied, and moderate. Continue other healthy lifestyle behaviors and choices. Follow-up prevention exam in 1 year. Follow-up office visit pending blood work.

## 2016-10-13 ENCOUNTER — Ambulatory Visit (INDEPENDENT_AMBULATORY_CARE_PROVIDER_SITE_OTHER): Payer: 59 | Admitting: Physician Assistant

## 2016-10-13 ENCOUNTER — Emergency Department (HOSPITAL_COMMUNITY): Payer: 59

## 2016-10-13 ENCOUNTER — Encounter: Payer: Self-pay | Admitting: Physician Assistant

## 2016-10-13 ENCOUNTER — Ambulatory Visit (INDEPENDENT_AMBULATORY_CARE_PROVIDER_SITE_OTHER): Payer: 59

## 2016-10-13 ENCOUNTER — Inpatient Hospital Stay (HOSPITAL_COMMUNITY)
Admission: EM | Admit: 2016-10-13 | Discharge: 2016-10-15 | DRG: 194 | Disposition: A | Discharge status: Home / Self Care | Payer: 59 | Attending: Internal Medicine | Admitting: Internal Medicine

## 2016-10-13 ENCOUNTER — Encounter (HOSPITAL_COMMUNITY): Payer: Self-pay

## 2016-10-13 VITALS — BP 136/95 | HR 142 | Temp 98.8°F | Resp 20 | Ht 62.0 in | Wt 214.0 lb

## 2016-10-13 DIAGNOSIS — J4551 Severe persistent asthma with (acute) exacerbation: Secondary | ICD-10-CM | POA: Diagnosis present

## 2016-10-13 DIAGNOSIS — E872 Acidosis: Secondary | ICD-10-CM | POA: Diagnosis present

## 2016-10-13 DIAGNOSIS — J45901 Unspecified asthma with (acute) exacerbation: Secondary | ICD-10-CM | POA: Diagnosis not present

## 2016-10-13 DIAGNOSIS — Z6837 Body mass index (BMI) 37.0-37.9, adult: Secondary | ICD-10-CM | POA: Diagnosis not present

## 2016-10-13 DIAGNOSIS — R062 Wheezing: Secondary | ICD-10-CM | POA: Diagnosis not present

## 2016-10-13 DIAGNOSIS — I4581 Long QT syndrome: Secondary | ICD-10-CM | POA: Diagnosis present

## 2016-10-13 DIAGNOSIS — R9431 Abnormal electrocardiogram [ECG] [EKG]: Secondary | ICD-10-CM | POA: Diagnosis present

## 2016-10-13 DIAGNOSIS — J180 Bronchopneumonia, unspecified organism: Secondary | ICD-10-CM | POA: Diagnosis present

## 2016-10-13 DIAGNOSIS — Z888 Allergy status to other drugs, medicaments and biological substances status: Secondary | ICD-10-CM | POA: Diagnosis not present

## 2016-10-13 DIAGNOSIS — Z23 Encounter for immunization: Secondary | ICD-10-CM

## 2016-10-13 DIAGNOSIS — F419 Anxiety disorder, unspecified: Secondary | ICD-10-CM | POA: Diagnosis present

## 2016-10-13 DIAGNOSIS — E6609 Other obesity due to excess calories: Secondary | ICD-10-CM | POA: Diagnosis present

## 2016-10-13 DIAGNOSIS — Z79899 Other long term (current) drug therapy: Secondary | ICD-10-CM | POA: Diagnosis not present

## 2016-10-13 DIAGNOSIS — R197 Diarrhea, unspecified: Secondary | ICD-10-CM | POA: Diagnosis present

## 2016-10-13 DIAGNOSIS — Z793 Long term (current) use of hormonal contraceptives: Secondary | ICD-10-CM

## 2016-10-13 DIAGNOSIS — R000 Tachycardia, unspecified: Secondary | ICD-10-CM

## 2016-10-13 DIAGNOSIS — Z87891 Personal history of nicotine dependence: Secondary | ICD-10-CM

## 2016-10-13 DIAGNOSIS — F603 Borderline personality disorder: Secondary | ICD-10-CM | POA: Diagnosis present

## 2016-10-13 DIAGNOSIS — E876 Hypokalemia: Secondary | ICD-10-CM | POA: Diagnosis present

## 2016-10-13 DIAGNOSIS — E66812 Obesity, class 2: Secondary | ICD-10-CM

## 2016-10-13 DIAGNOSIS — Z7951 Long term (current) use of inhaled steroids: Secondary | ICD-10-CM

## 2016-10-13 DIAGNOSIS — R0602 Shortness of breath: Secondary | ICD-10-CM

## 2016-10-13 LAB — BASIC METABOLIC PANEL
Anion gap: 13 (ref 5–15)
BUN: <5 mg/dL — ABNORMAL LOW (ref 6–20)
CHLORIDE: 102 mmol/L (ref 101–111)
CO2: 18 mmol/L — ABNORMAL LOW (ref 22–32)
Calcium: 8.6 mg/dL — ABNORMAL LOW (ref 8.9–10.3)
Creatinine, Ser: 0.8 mg/dL (ref 0.44–1.00)
GFR calc non Af Amer: >60 mL/min (ref >60)
Glucose, Bld: 152 mg/dL — ABNORMAL HIGH (ref 65–99)
POTASSIUM: 2.8 mmol/L — AB (ref 3.5–5.1)
SODIUM: 133 mmol/L — AB (ref 135–145)

## 2016-10-13 LAB — I-STAT TROPONIN, ED: Troponin i, poc: 0 ng/mL (ref 0.00–0.08)

## 2016-10-13 LAB — I-STAT BETA HCG BLOOD, ED (MC, WL, AP ONLY)

## 2016-10-13 LAB — CBC
HEMATOCRIT: 41.2 % (ref 36.0–46.0)
Hemoglobin: 14 g/dL (ref 12.0–15.0)
MCH: 30.1 pg (ref 26.0–34.0)
MCHC: 34 g/dL (ref 30.0–36.0)
MCV: 88.6 fL (ref 78.0–100.0)
Platelets: 268 10*3/uL (ref 150–400)
RBC: 4.65 MIL/uL (ref 3.87–5.11)
RDW: 12.6 % (ref 11.5–15.5)
WBC: 13.7 10*3/uL — AB (ref 4.0–10.5)

## 2016-10-13 MED ORDER — SODIUM CHLORIDE 0.9 % IV BOLUS (SEPSIS)
2000.0000 mL | Freq: Once | INTRAVENOUS | Status: AC
  Administered 2016-10-13: 2000 mL via INTRAVENOUS

## 2016-10-13 MED ORDER — INSULIN ASPART 100 UNIT/ML ~~LOC~~ SOLN
0.0000 [IU] | Freq: Three times a day (TID) | SUBCUTANEOUS | Status: DC
  Administered 2016-10-14 – 2016-10-15 (×4): 1 [IU] via SUBCUTANEOUS

## 2016-10-13 MED ORDER — POTASSIUM CHLORIDE CRYS ER 20 MEQ PO TBCR
60.0000 meq | EXTENDED_RELEASE_TABLET | Freq: Once | ORAL | Status: AC
  Administered 2016-10-13: 60 meq via ORAL
  Filled 2016-10-13: qty 3

## 2016-10-13 MED ORDER — METHYLPREDNISOLONE SODIUM SUCC 125 MG IJ SOLR
125.0000 mg | Freq: Once | INTRAMUSCULAR | Status: AC
  Administered 2016-10-13: 125 mg via INTRAVENOUS
  Filled 2016-10-13: qty 2

## 2016-10-13 MED ORDER — FLUTICASONE PROPIONATE HFA 44 MCG/ACT IN AERO: 2.0000 | INHALATION_SPRAY | Freq: Two times a day (BID) | RESPIRATORY_TRACT | Status: DC

## 2016-10-13 MED ORDER — MAGNESIUM SULFATE 2 GM/50ML IV SOLN
2.0000 g | Freq: Once | INTRAVENOUS | Status: AC
  Administered 2016-10-13: 2 g via INTRAVENOUS
  Filled 2016-10-13: qty 50

## 2016-10-13 MED ORDER — ALBUTEROL SULFATE (2.5 MG/3ML) 0.083% IN NEBU
2.5000 mg | INHALATION_SOLUTION | Freq: Once | RESPIRATORY_TRACT | Status: AC
  Administered 2016-10-13: 2.5 mg via RESPIRATORY_TRACT

## 2016-10-13 MED ORDER — ALBUTEROL SULFATE (2.5 MG/3ML) 0.083% IN NEBU: 2.5000 mg | INHALATION_SOLUTION | RESPIRATORY_TRACT | 1 refills | Status: DC | PRN

## 2016-10-13 MED ORDER — IOPAMIDOL (ISOVUE-370) INJECTION 76%
INTRAVENOUS | Status: AC
  Administered 2016-10-13: 86 mL
  Filled 2016-10-13: qty 100

## 2016-10-13 MED ORDER — ALBUTEROL SULFATE (2.5 MG/3ML) 0.083% IN NEBU: 5.0000 mg | INHALATION_SOLUTION | Freq: Once | RESPIRATORY_TRACT | Status: DC

## 2016-10-13 MED ORDER — ENOXAPARIN SODIUM 40 MG/0.4ML ~~LOC~~ SOLN
40.0000 mg | Freq: Every day | SUBCUTANEOUS | Status: DC
  Administered 2016-10-14 (×2): 40 mg via SUBCUTANEOUS
  Filled 2016-10-13 (×3): qty 0.4

## 2016-10-13 MED ORDER — SODIUM CHLORIDE 0.9 % IV SOLN
INTRAVENOUS | Status: AC
Start: 2016-10-13 — End: 2016-10-14
  Administered 2016-10-13 – 2016-10-14 (×2): via INTRAVENOUS

## 2016-10-13 MED ORDER — DEXTROSE 5 % IV SOLN
1.0000 g | INTRAVENOUS | Status: DC
  Filled 2016-10-13: qty 10

## 2016-10-13 MED ORDER — NONFORMULARY OR COMPOUNDED ITEM
1.0000 | Freq: Every day | Status: DC
  Administered 2016-10-14 – 2016-10-15 (×2): 1 via ORAL

## 2016-10-13 MED ORDER — INSULIN ASPART 100 UNIT/ML ~~LOC~~ SOLN
0.0000 [IU] | Freq: Every day | SUBCUTANEOUS | Status: DC
Start: 2016-10-13 — End: 2016-10-15
  Administered 2016-10-14: 3 [IU] via SUBCUTANEOUS

## 2016-10-13 MED ORDER — IPRATROPIUM BROMIDE 0.02 % IN SOLN
0.5000 mg | Freq: Once | RESPIRATORY_TRACT | Status: AC
  Administered 2016-10-13: 0.5 mg via RESPIRATORY_TRACT

## 2016-10-13 MED ORDER — GABAPENTIN 300 MG PO CAPS
300.0000 mg | ORAL_CAPSULE | Freq: Three times a day (TID) | ORAL | Status: DC
  Administered 2016-10-14 – 2016-10-15 (×5): 300 mg via ORAL
  Filled 2016-10-13 (×5): qty 1

## 2016-10-13 MED ORDER — LORAZEPAM 2 MG/ML IJ SOLN
1.0000 mg | Freq: Four times a day (QID) | INTRAMUSCULAR | Status: DC | PRN
  Administered 2016-10-14 (×2): 1 mg via INTRAVENOUS
  Filled 2016-10-13 (×2): qty 1

## 2016-10-13 MED ORDER — ALBUTEROL SULFATE (2.5 MG/3ML) 0.083% IN NEBU: 2.5000 mg | INHALATION_SOLUTION | Freq: Once | RESPIRATORY_TRACT | Status: DC

## 2016-10-13 MED ORDER — IPRATROPIUM BROMIDE 0.02 % IN SOLN: 0.5000 mg | Freq: Once | RESPIRATORY_TRACT | Status: DC

## 2016-10-13 MED ORDER — LORAZEPAM 2 MG/ML IJ SOLN
0.5000 mg | Freq: Once | INTRAMUSCULAR | Status: AC
  Administered 2016-10-13: 0.5 mg via INTRAVENOUS
  Filled 2016-10-13: qty 1

## 2016-10-13 MED ORDER — SODIUM CHLORIDE 0.9 % IV SOLN: INTRAVENOUS | Status: DC

## 2016-10-13 MED ORDER — ALBUTEROL (5 MG/ML) CONTINUOUS INHALATION SOLN
10.0000 mg/h | INHALATION_SOLUTION | Freq: Once | RESPIRATORY_TRACT | Status: AC
  Administered 2016-10-13: 10 mg/h via RESPIRATORY_TRACT
  Filled 2016-10-13: qty 20

## 2016-10-13 MED ORDER — ALBUTEROL SULFATE (2.5 MG/3ML) 0.083% IN NEBU: 2.5000 mg | INHALATION_SOLUTION | RESPIRATORY_TRACT | Status: DC | PRN

## 2016-10-13 MED ORDER — PALIPERIDONE ER 6 MG PO TB24: 6.0000 mg | ORAL_TABLET | Freq: Every day | ORAL | Status: DC

## 2016-10-13 MED ORDER — DEXTROSE 5 % IV SOLN
500.0000 mg | INTRAVENOUS | Status: DC
  Filled 2016-10-13: qty 500

## 2016-10-13 MED ORDER — DOXYCYCLINE HYCLATE 100 MG PO TABS
100.0000 mg | ORAL_TABLET | Freq: Two times a day (BID) | ORAL | Status: DC
  Administered 2016-10-14 – 2016-10-15 (×4): 100 mg via ORAL
  Filled 2016-10-13 (×4): qty 1

## 2016-10-13 MED ORDER — NORETHINDRONE-ETH ESTRADIOL 1-50 MG-MCG PO TABS: 1.0000 | ORAL_TABLET | Freq: Every day | ORAL | Status: DC

## 2016-10-13 MED ORDER — POTASSIUM CHLORIDE 10 MEQ/100ML IV SOLN
10.0000 meq | INTRAVENOUS | Status: AC
  Administered 2016-10-14 (×2): 10 meq via INTRAVENOUS
  Filled 2016-10-13 (×2): qty 100

## 2016-10-13 MED ORDER — BUDESONIDE 0.25 MG/2ML IN SUSP
0.2500 mg | Freq: Two times a day (BID) | RESPIRATORY_TRACT | Status: DC
  Administered 2016-10-14 – 2016-10-15 (×3): 0.25 mg via RESPIRATORY_TRACT
  Filled 2016-10-13 (×3): qty 2

## 2016-10-13 MED ORDER — DEXTROSE 5 % IV SOLN
1.0000 g | Freq: Every day | INTRAVENOUS | Status: DC
  Administered 2016-10-13 – 2016-10-14 (×2): 1 g via INTRAVENOUS
  Filled 2016-10-13: qty 10

## 2016-10-13 NOTE — ED Notes (Signed)
Call report to [facility], [place] at 23:45   [phone]

## 2016-10-13 NOTE — ED Triage Notes (Signed)
Pt has been sick since Sunday. Fever, chills. Taking mucinex. Pt does have SOB w/ tachycardia

## 2016-10-13 NOTE — Progress Notes (Signed)
10/13/2016 5:43 PM   DOB: 1985/12/02 / MRN: [id number]  SUBJECTIVE:  [name] is a 31 y.o. female presenting for wheezing that started roughly two days ago and is worsening.  She has a history of asthma.  Associates dry cough and fever only at night.  No leg swelling.  Chest feels tight, worse with exertion. Former smoker. Takes OCP's.   She is allergic to cogentin [benztropine].   She  has a past medical history of Allergy; Asthma; and Borderline personality disorder.    She  reports that she quit smoking about 10 months ago. She has a 4.50 pack-year smoking history. She has never used smokeless tobacco. She reports that she uses drugs, including Marijuana, about 4 times per week. She reports that she does not drink alcohol. She  reports that she currently engages in sexual activity. She reports using the following method of birth control/protection: Pill. The patient  has a past surgical history that includes Thumb surgery (Left).  Her family history includes Alzheimer's disease in her maternal grandmother; Healthy in her father and mother; Skin cancer in her paternal grandmother.  Review of Systems  Constitutional: Positive for fever. Negative for chills and diaphoresis.  Respiratory: Positive for cough and wheezing. Negative for hemoptysis, sputum production and shortness of breath.   Cardiovascular: Negative for orthopnea and leg swelling.  Gastrointestinal: Negative for nausea.  Skin: Negative for rash.  Neurological: Negative for dizziness.    The problem list and medications were reviewed and updated by myself where necessary and exist elsewhere in the encounter.   OBJECTIVE:  BP (!) 136/95 (BP Location: Left Arm, Patient Position: Sitting, Cuff Size: Normal)   Pulse (!) 142   Temp 98.8 F (37.1 C) (Oral)   Resp 20   Ht 5\' 2"  (1.575 m)   Wt 214 lb (97.1 kg)   SpO2 95%   BMI 39.14 kg/m   Pulse Readings from Last 3 Encounters:  10/13/16 (!) 142  04/17/16 79    03/13/16 87     Physical Exam  Constitutional: She is active.  Non-toxic appearance.  Cardiovascular: Normal rate, regular rhythm, S1 normal, S2 normal, normal heart sounds and intact distal pulses.  Exam reveals no gallop, no friction rub and no decreased pulses.   No murmur heard. Pulmonary/Chest: No stridor. No tachypnea. She is in respiratory distress. She has wheezes. She has no rales. She exhibits no tenderness.  Abdominal: She exhibits no distension.  Musculoskeletal: She exhibits no edema.  Neurological: She is alert.  Skin: Skin is warm and dry. She is not diaphoretic. No pallor.    No results found for this or any previous visit (from the past 72 hour(s)).  Dg Chest 2 View  Result Date: 10/13/2016 CLINICAL DATA:  Wheezing with shortness of breath. History of asthma. EXAM: CHEST  2 VIEW COMPARISON:  04/22/2015 and 03/30/2015. FINDINGS: The heart size and mediastinal contours are stable. The lungs are clear and not significantly hyperinflated. There is probable mild chronic central airway thickening. No pleural effusion or pneumothorax. The bones appear unremarkable. IMPRESSION: Suspected mild chronic central airway thickening consistent with asthma. No hyperinflation or acute findings. Electronically Signed   By: Richardean Sale M.D.   On: 10/13/2016 16:53    ASSESSMENT AND PLAN:  [name] was seen today for cough and asthma.  Diagnoses and all orders for this visit:  Wheezing: She hs a history of borderline personality disorder and tells me that steroids have caused her great anxiety in  the past.  She does not want steroids at this time.  I can not break her wheezing with 3 albuterol nebs and 2 ipratropium nebs.  I would feel more comfortable if steroids were an option.  After some disucssion she has opted to go to the ED for continued care.  -     DG Chest 2 View; Future -     albuterol (PROVENTIL) (2.5 MG/3ML) 0.083% nebulizer solution 2.5 mg; Take 3 mLs (2.5 mg total) by  nebulization once. -     ipratropium (ATROVENT) nebulizer solution 0.5 mg; Take 2.5 mLs (0.5 mg total) by nebulization once. -     albuterol (PROVENTIL) (2.5 MG/3ML) 0.083% nebulizer solution 2.5 mg; Take 3 mLs (2.5 mg total) by nebulization once. -     albuterol (PROVENTIL) (2.5 MG/3ML) 0.083% nebulizer solution 2.5 mg; Take 3 mLs (2.5 mg total) by nebulization once. -     ipratropium (ATROVENT) nebulizer solution 0.5 mg; Take 2.5 mLs (0.5 mg total) by nebulization once.  Tachycardia -     albuterol (PROVENTIL) (2.5 MG/3ML) 0.083% nebulizer solution; Take 3 mLs (2.5 mg total) by nebulization every 4 (four) hours as needed for wheezing or shortness of breath.    The patient is advised to call or return to clinic if she does not see an improvement in symptoms, or to seek the care of the closest emergency department if she worsens with the above plan.   Philis Fendt, MHS, PA-C [facility] 10/13/2016 5:43 PM

## 2016-10-13 NOTE — ED Provider Notes (Signed)
[name] Provider Note   CSN: [id number] Arrival date & time: 10/13/16  1821     History   Chief Complaint Chief Complaint  Patient presents with  . Tachycardia  . Shortness of Breath    HPI [name] is a 31 y.o. female.  31 year old female presents with several-day history of dyspnea on exertion as well as nonproductive cough. No fever or chills.Has been using her home MDIs without response. Denies any leg pain or swelling. No pleuritic chest pain but she does take birth control pills.went to urgent care and was found to be dyspneic and tachycardic and sent here for further management Patient received inhaled steroids as well as 2 albuteroltreatments in the office prior to arriving here. Denies any prior history of pulmonary embolus.      Past Medical History:  Diagnosis Date  . Allergy   . Asthma   . Borderline personality disorder     Patient Active Problem List   Diagnosis Date Noted  . Encounter for general adult medical examination with abnormal findings 04/17/2016  . Class 2 obesity due to excess calories without serious comorbidity with body mass index (BMI) of 37.0 to 37.9 in adult 04/17/2016  . Asthma 03/13/2016  . Borderline personality disorder 03/13/2016  . Eczema 03/13/2016  . Cough 01/28/2016  . Antibiotic-associated diarrhea 01/28/2016  . Mood disorder ([place]) 03/04/2013    Past Surgical History:  Procedure Laterality Date  . Thumb surgery Left     OB History    No data available       Home Medications    Prior to Admission medications   Medication Sig Start Date End Date Taking? Authorizing Provider  albuterol (PROVENTIL) (2.5 MG/3ML) 0.083% nebulizer solution Take 3 mLs (2.5 mg total) by nebulization every 6 (six) hours as needed for wheezing or shortness of breath. 12/25/15   Alveda Reasons, MD  albuterol (PROVENTIL) (2.5 MG/3ML) 0.083% nebulizer solution Take 3 mLs (2.5 mg total) by nebulization every 4 (four) hours as  needed for wheezing or shortness of breath. 10/13/16   Tereasa Coop, PA-C  FLUoxetine (PROZAC) 20 MG capsule Take 3 capsules (60 mg total) by mouth daily. 05/01/14   Wendie Agreste, MD  fluticasone (FLOVENT HFA) 44 MCG/ACT inhaler Inhale 2 puffs into the lungs 2 (two) times daily. 04/17/16   Golden Circle, FNP  gabapentin (NEURONTIN) 300 MG capsule Take 1 capsule (300 mg total) by mouth 3 (three) times daily. 05/01/14   Wendie Agreste, MD  hydrOXYzine (ATARAX/VISTARIL) 50 MG tablet Take 50 mg by mouth 3 (three) times daily as needed.    [provider]  levalbuterol Penne Lash HFA) 45 MCG/ACT inhaler Inhale 1-2 puffs into the lungs every 6 (six) hours as needed for wheezing. 03/13/16   Golden Circle, FNP  norethindrone-ethinyl estradiol (OVCON-50) 1-50 MG-MCG tablet Take 1 tablet by mouth daily.    [provider]  paliperidone (INVEGA) 6 MG 24 hr tablet Take 1 tablet (6 mg total) by mouth daily. 05/01/14   Wendie Agreste, MD    Family History Family History  Problem Relation Age of Onset  . Healthy Mother   . Healthy Father   . Alzheimer's disease Maternal Grandmother   . Skin cancer Paternal Grandmother     Social History Social History  Substance Use Topics  . Smoking status: Former Smoker    Packs/day: 0.50    Years: 9.00    Quit date: 12/04/2015  . Smokeless tobacco: Never  Used  . Alcohol use No     Allergies   Cogentin [benztropine]   Review of Systems Review of Systems  All other systems reviewed and are negative.    Physical Exam Updated Vital Signs BP 131/86 (BP Location: Right Arm)   Pulse (!) 165   Temp 98.3 F (36.8 C) (Oral)   Resp (!) 24   Ht 1.575 m (5\' 2" )   Wt 97.1 kg (214 lb)   LMP 09/28/2016   SpO2 95%   BMI 39.14 kg/m   Physical Exam  Constitutional: She is oriented to person, place, and time. She appears well-developed and well-nourished.  Non-toxic appearance. No distress.  HENT:  Head: Normocephalic and  atraumatic.  Eyes: Pupils are equal, round, and reactive to light. Conjunctivae, EOM and lids are normal.  Neck: Normal range of motion. Neck supple. No tracheal deviation present. No thyroid mass present.  Cardiovascular: Regular rhythm and normal heart sounds.  Tachycardia present.  Exam reveals no gallop.   No murmur heard. Pulmonary/Chest: Effort normal. No stridor. No respiratory distress. She has decreased breath sounds in the right lower field and the left lower field. She has wheezes in the right lower field and the left lower field. She has no rhonchi. She has no rales.  Abdominal: Soft. Normal appearance and bowel sounds are normal. She exhibits no distension. There is no tenderness. There is no rebound and no CVA tenderness.  Musculoskeletal: Normal range of motion. She exhibits no edema or tenderness.  Neurological: She is alert and oriented to person, place, and time. She has normal strength. No cranial nerve deficit or sensory deficit. GCS eye subscore is 4. GCS verbal subscore is 5. GCS motor subscore is 6.  Skin: Skin is warm and dry. No abrasion and no rash noted.  Psychiatric: She has a normal mood and affect. Her speech is normal and behavior is normal.  Nursing note and vitals reviewed.    ED Treatments / Results  Labs (all labs ordered are listed, but only abnormal results are displayed) Labs Reviewed  BASIC METABOLIC PANEL  CBC  I-STAT TROPONIN, ED  I-STAT BETA HCG BLOOD, ED ([facility], [facility], [facility] ONLY)  I-STAT BETA HCG BLOOD, ED ([facility], [facility], [facility] ONLY)    EKG  EKG Interpretation  Date/Time:  Tuesday October 13 2016 18:41:03 EDT Ventricular Rate:  138 PR Interval:    QRS Duration: 77 QT Interval:  352 QTC Calculation: 534 R Axis:   88 Text Interpretation:  Sinus tachycardia Ventricular premature complex Aberrant complex Repol abnrm suggests ischemia, diffuse leads Prolonged QT interval Confirmed by Lacretia Leigh ([id number]) on 10/13/2016 6:54:15 PM       Radiology Dg  Chest 2 View  Result Date: 10/13/2016 CLINICAL DATA:  Wheezing with shortness of breath. History of asthma. EXAM: CHEST  2 VIEW COMPARISON:  04/22/2015 and 03/30/2015. FINDINGS: The heart size and mediastinal contours are stable. The lungs are clear and not significantly hyperinflated. There is probable mild chronic central airway thickening. No pleural effusion or pneumothorax. The bones appear unremarkable. IMPRESSION: Suspected mild chronic central airway thickening consistent with asthma. No hyperinflation or acute findings. Electronically Signed   By: Richardean Sale M.D.   On: 10/13/2016 16:53    Procedures Procedures (including critical care time)  Medications Ordered in ED Medications  albuterol (PROVENTIL) (2.5 MG/3ML) 0.083% nebulizer solution 5 mg (0 mg Nebulization Hold 10/13/16 1850)  sodium chloride 0.9 % bolus 2,000 mL (not administered)  0.9 %  sodium chloride infusion (not  administered)  methylPREDNISolone sodium succinate (SOLU-MEDROL) 125 mg/2 mL injection 125 mg (not administered)  LORazepam (ATIVAN) injection 0.5 mg (not administered)     Initial Impression / Assessment and Plan / ED Course  I have reviewed the triage vital signs and the nursing notes.  Pertinent labs & imaging results that were available during my care of the patient were reviewed by me and considered in my medical decision making (see chart for details).     Patient received 2 doses of albuterol prior to arrival was performing tachycardic. Her lung exam did have some expiratory wheezing.patient had a CT of her chest which was negative for PE but does show possibleinfectious bronchiolitis. Patient medicated withSolu-Medrol and started on antibiotics and will be admitted for further management  Final Clinical Impressions(s) / ED Diagnoses   Final diagnoses:  None    New Prescriptions New Prescriptions   No medications on file     Lacretia Leigh, MD 10/13/16 2209

## 2016-10-13 NOTE — H&P (Signed)
History and Physical    [name] SEG:[id number] DOB: December 28, 1985 DOA: 10/13/2016  PCP: Golden Circle, FNP   Patient coming from: Home, by way of urgent care   Chief Complaint: SOB, wheezing, fever, chills   HPI: [name] is a 31 y.o. female with medical history significant for persistent asthma, seasonal allergies, anxiety, and borderline personality disorder, now presenting to the emergency department for evaluation of subjective fevers, chills, cough, wheezing, and shortness of breath. Patient reports that she had been in her usual state of health until 4 days ago when she noted the insidious development of subjective fevers with chills, nonproductive cough, and slight dyspnea with exertion. Since that time, she has been experiencing recurrent subjective fevers, worsening cough wheezing, and is now very dyspneic at rest, despite her use of albuterol and inhaled steroids at home. She went to urgent care today for evaluation of this, was noted to be in acute distress and markedly tachycardic, and was directed to the ED for further evaluation.  ED Course: Upon arrival to the ED, patient is found to be afebrile, saturating adequately on room air, tachypneic, tachycardic to 160, and with stable blood pressure. EKG features a sinus tachycardia with rate 138, PVC, and QTc interval prolonged to 534 ms. Condition panels notable for potassium of 2.8, bicarbonate of 18, and glucose of 152. CBC features a leukocytosis to 13,700. Troponin is undetectable. Chest x-ray features mild bronchitic changes. CTA chest was obtained and negative for PE, but notable for ground-glass opacities in the bilateral lungs concerning for an inflammatory or infectious etiology. Patient was treated with continuous albuterol neb in the ED, 125 mg of IV Solu-Medrol, 2 L normal saline, 60 mEq of oral potassium, Ativan, IV Rocephin, and IV azithromycin. Tachycardia improved but persisted, blood pressure remained stable,  patient had slight subjective improvement in her breathing, but remains in mild distress despite resting in her bed. She will be admitted to the telemetry unit for ongoing evaluation and management of asthma with acute exacerbation, likely precipitated by bronchopneumonia.  Review of Systems:  All other systems reviewed and apart from HPI, are negative.  Past Medical History:  Diagnosis Date  . Allergy   . Asthma   . Borderline personality disorder     Past Surgical History:  Procedure Laterality Date  . Thumb surgery Left      reports that she quit smoking about 10 months ago. She has a 4.50 pack-year smoking history. She has never used smokeless tobacco. She reports that she uses drugs, including Marijuana, about 4 times per week. She reports that she does not drink alcohol.  Allergies  Allergen Reactions  . Cogentin [Benztropine]     Family History  Problem Relation Age of Onset  . Healthy Mother   . Healthy Father   . Alzheimer's disease Maternal Grandmother   . Skin cancer Paternal Grandmother      Prior to Admission medications   Medication Sig Start Date End Date Taking? Authorizing Provider  albuterol (PROVENTIL) (2.5 MG/3ML) 0.083% nebulizer solution Take 3 mLs (2.5 mg total) by nebulization every 4 (four) hours as needed for wheezing or shortness of breath. 10/13/16  Yes Tereasa Coop, PA-C  FLUoxetine (PROZAC) 20 MG capsule Take 3 capsules (60 mg total) by mouth daily. 05/01/14  Yes Wendie Agreste, MD  gabapentin (NEURONTIN) 300 MG capsule Take 1 capsule (300 mg total) by mouth 3 (three) times daily. 05/01/14  Yes Wendie Agreste, MD  hydrOXYzine (ATARAX/VISTARIL) 50  MG tablet Take 50 mg by mouth 2 (two) times daily.    Yes [provider]  levalbuterol (XOPENEX HFA) 45 MCG/ACT inhaler Inhale 1-2 puffs into the lungs every 6 (six) hours as needed for wheezing. 03/13/16  Yes Golden Circle, FNP  norethindrone-ethinyl estradiol (OVCON-50) 1-50 MG-MCG  tablet Take 1 tablet by mouth daily.   Yes [provider]  paliperidone (INVEGA) 6 MG 24 hr tablet Take 1 tablet (6 mg total) by mouth daily. 05/01/14  Yes Wendie Agreste, MD  albuterol (PROVENTIL) (2.5 MG/3ML) 0.083% nebulizer solution Take 3 mLs (2.5 mg total) by nebulization every 6 (six) hours as needed for wheezing or shortness of breath. Patient not taking: Reported on 10/13/2016 12/25/15   Alveda Reasons, MD  fluticasone [facility] HFA) 44 MCG/ACT inhaler Inhale 2 puffs into the lungs 2 (two) times daily. 04/17/16   Golden Circle, FNP    Physical Exam: Vitals:   10/13/16 1957 10/13/16 2000 10/13/16 2122 10/13/16 2217  BP: 126/84 129/77 126/82   Pulse: (!) 118 (!) 117 (!) 109   Resp: 17 16 (!) 22   Temp:      TempSrc:      SpO2: 97% 97% 96% 100%  Weight:      Height:         Constitutional: In respiratory distress with tachypnea and accessory muscle recruitment, no pallor or cyanosis Eyes: PERTLA, lids and conjunctivae normal ENMT: Mucous membranes are moist. Posterior pharynx clear of any exudate or lesions.   Neck: normal, supple, no masses, no thyromegaly Respiratory: Diffuse rhonchi. Expiratory wheezes. Tachypnea. Dyspneic with speech. Accessory muscle use. No pallor, no cyanosis.   Cardiovascular: Rate ~120 and regular. No extremity edema. No significant JVD. Abdomen: No distension, no tenderness, no masses palpated. Bowel sounds normal.  Musculoskeletal: no clubbing / cyanosis. No joint deformity upper and lower extremities.   Skin: no significant rashes, lesions, ulcers. Warm, dry, well-perfused. Neurologic: CN 2-12 grossly intact. Sensation intact. Strength 5/5 in all 4 limbs.  Psychiatric: Alert and oriented x 3. Calm, cooperative.     Labs on Admission: I have personally reviewed following labs and imaging studies  CBC:  Recent Labs Lab 10/13/16 1851  WBC 13.7*  HGB 14.0  HCT 41.2  MCV 88.6  PLT 315   Basic Metabolic Panel:  Recent  Labs Lab 10/13/16 1851  NA 133*  K 2.8*  CL 102  CO2 18*  GLUCOSE 152*  BUN <5*  CREATININE 0.80  CALCIUM 8.6*   GFR: Estimated Creatinine Clearance: 113.9 mL/min (by C-G formula based on SCr of 0.8 mg/dL). Liver Function Tests: No results for input(s): AST, ALT, ALKPHOS, BILITOT, PROT, ALBUMIN in the last 168 hours. No results for input(s): LIPASE, AMYLASE in the last 168 hours. No results for input(s): AMMONIA in the last 168 hours. Coagulation Profile: No results for input(s): INR, PROTIME in the last 168 hours. Cardiac Enzymes: No results for input(s): CKTOTAL, CKMB, CKMBINDEX, TROPONINI in the last 168 hours. BNP (last 3 results) No results for input(s): PROBNP in the last 8760 hours. HbA1C: No results for input(s): HGBA1C in the last 72 hours. CBG: No results for input(s): GLUCAP in the last 168 hours. Lipid Profile: No results for input(s): CHOL, HDL, LDLCALC, TRIG, CHOLHDL, LDLDIRECT in the last 72 hours. Thyroid Function Tests: No results for input(s): TSH, T4TOTAL, FREET4, T3FREE, THYROIDAB in the last 72 hours. Anemia Panel: No results for input(s): VITAMINB12, FOLATE, FERRITIN, TIBC, IRON, RETICCTPCT in the last 72  hours. Urine analysis:    Component Value Date/Time   COLORURINE AMBER BIOCHEMICALS MAY BE AFFECTED BY COLOR (A) 11/20/2009 1825   APPEARANCEUR CLOUDY (A) 11/20/2009 1825   LABSPEC 1.030 11/20/2009 1825   PHURINE 6.5 11/20/2009 1825   GLUCOSEU NEGATIVE 11/20/2009 1825   HGBUR NEGATIVE 11/20/2009 1825   BILIRUBINUR NEGATIVE 11/20/2009 1825   KETONESUR 15 (A) 11/20/2009 1825   PROTEINUR NEGATIVE 11/20/2009 1825   UROBILINOGEN 1.0 11/20/2009 1825   NITRITE NEGATIVE 11/20/2009 1825   LEUKOCYTESUR  11/20/2009 1825    NEGATIVE MICROSCOPIC NOT DONE ON URINES WITH NEGATIVE PROTEIN, BLOOD, LEUKOCYTES, NITRITE, OR GLUCOSE <1000 mg/dL.   Sepsis Labs: @LABRCNTIP (procalcitonin:4,lacticidven:4) )No results found for this or any previous visit (from the  past 240 hour(s)).   Radiological Exams on Admission: Dg Chest 2 View  Result Date: 10/13/2016 CLINICAL DATA:  Pt c/o sob, cp, cough, weakness x 3 days. Hx anxiety. EXAM: CHEST  2 VIEW COMPARISON:  10/13/2016 FINDINGS: There is minimal perihilar peribronchial thickening. No focal consolidations or pleural effusions. No pulmonary edema. IMPRESSION: Mild bronchitic changes. Electronically Signed   By: Nolon Nations M.D.   On: 10/13/2016 19:38   Dg Chest 2 View  Result Date: 10/13/2016 CLINICAL DATA:  Wheezing with shortness of breath. History of asthma. EXAM: CHEST  2 VIEW COMPARISON:  04/22/2015 and 03/30/2015. FINDINGS: The heart size and mediastinal contours are stable. The lungs are clear and not significantly hyperinflated. There is probable mild chronic central airway thickening. No pleural effusion or pneumothorax. The bones appear unremarkable. IMPRESSION: Suspected mild chronic central airway thickening consistent with asthma. No hyperinflation or acute findings. Electronically Signed   By: Richardean Sale M.D.   On: 10/13/2016 16:53   Ct Angio Chest Pe W/cm &/or Wo Cm  Result Date: 10/13/2016 CLINICAL DATA:  PE suspected, high pretest prob. Fever and chills. Shortness of breath. EXAM: CT ANGIOGRAPHY CHEST WITH CONTRAST TECHNIQUE: Multidetector CT imaging of the chest was performed using the standard protocol during bolus administration of intravenous contrast. Multiplanar CT image reconstructions and MIPs were obtained to evaluate the vascular anatomy. CONTRAST:  86 cc Isovue 370 IV COMPARISON:  Chest radiographs earlier this day. FINDINGS: Cardiovascular: There are no filling defects within the pulmonary arteries to suggest pulmonary embolus. Motion artifact limits detailed assessment particularly in the lower lobes. Normal caliber thoracic aorta. No aortic dissection. The heart is normal in size. No pericardial effusion. Mediastinum/Nodes: Prominent right hilar node measuring 8 mm, not  enlarged by size criteria. Shotty mediastinal lymph nodes. No enlarged mediastinal, hilar, or axillary adenopathy. Esophagus decompressed. Visualized thyroid gland is normal. Lungs/Pleura: Patchy geographic ground-glass opacities throughout both lungs, greatest in the right upper lobe, nonspecific. There is central bronchial thickening. No septal thickening. No pulmonary mass. No pleural fluid. Upper Abdomen: No acute abnormality. Musculoskeletal: There are no acute or suspicious osseous abnormalities. Review of the MIP images confirms the above findings. IMPRESSION: 1. No pulmonary embolus. 2. Geographic ground-glass opacities throughout both lungs, nonspecific. In the setting of fever and chills likely infectious bronchiolitis. Inflammatory etiologies are also considered. There is central bronchial thickening. Electronically Signed   By: Jeb Levering M.D.   On: 10/13/2016 21:12    EKG: Independently reviewed. Sinus tachycardia (rate 138), PVC, QTc 534 ms.   Assessment/Plan  1. Asthma with acute exacerbation  - Pt has persistent asthma, managed at home with Flovent BID and prn albuterol  - She presents with cough, dyspnea, wheezing, and is in acute distress on arrival  -  She improved some in ED with 125 mg IV Solu-Medrol, continuous albuterol treatment, and abx  - Plan to continue inhaled steroid, systemic steroid with Solu-Medrol 60 mg IV q6h, nebs; will give 2 g IV magnesium    2. Bronchopneumonia  - Pt reports 3-4 days of subjective fevers and chills, cough, and dyspnea  - Nonspecific ground-glass opacities bilaterally on CTA likely reflect acute infectious process given her hx  - She was treated in ED with Rocephin and azithromycin  - Plan to check sputum culture, and given the prolonged QT, will continue abx with Rocephin and doxycycline   3. Anxiety, borderline personality disorder - Seems to be stable on admission  - Continue paliperidone with close monitoring of QT interval  - Hold  Prozac and Vistaril until QT normalizes    4. Hypokalemia  - Serum potassium is 2.8 on admission, likely secondary to high-doses of albuterol  - Treated in ED with 60 mEq oral potassium, given another 20 mEq IV on admission in light of prolonged QT - Continue cardiac monitoring, repeat chem panel in am   5. Prolonged QT interval  - QTc interval is 534 ms on admission, likely secondary to hypokalemia, but also on several offending medications  - Potassium has been replaced, 2 g IV mag has been given in setting of severe asthma exacerbation  - Plan to continue cardiac monitoring, repeat EKG, hold Vistaril and Prozac, change azithromycin to doxycycline    DVT prophylaxis: sq Lovenox  Code Status: Full  Family Communication: Discussed with patient Disposition Plan: Admit to telemetry  Consults called: None Admission status: Inpatient    Vianne Bulls, MD [facility] Pager [phone]  If 7PM-7AM, please contact night-coverage [email or web address] Password [facility]  10/13/2016, 10:38 PM

## 2016-10-14 DIAGNOSIS — J45901 Unspecified asthma with (acute) exacerbation: Secondary | ICD-10-CM

## 2016-10-14 LAB — BASIC METABOLIC PANEL
Anion gap: 6 (ref 5–15)
BUN: <5 mg/dL — ABNORMAL LOW (ref 6–20)
CHLORIDE: 109 mmol/L (ref 101–111)
CO2: 20 mmol/L — ABNORMAL LOW (ref 22–32)
CREATININE: 0.64 mg/dL (ref 0.44–1.00)
Calcium: 8.8 mg/dL — ABNORMAL LOW (ref 8.9–10.3)
GFR calc non Af Amer: >60 mL/min (ref >60)
Glucose, Bld: 118 mg/dL — ABNORMAL HIGH (ref 65–99)
POTASSIUM: 4.3 mmol/L (ref 3.5–5.1)
SODIUM: 135 mmol/L (ref 135–145)

## 2016-10-14 LAB — GLUCOSE, CAPILLARY
GLUCOSE-CAPILLARY: 138 mg/dL — AB (ref 65–99)
Glucose-Capillary: 132 mg/dL — ABNORMAL HIGH (ref 65–99)
Glucose-Capillary: 145 mg/dL — ABNORMAL HIGH (ref 65–99)
Glucose-Capillary: 180 mg/dL — ABNORMAL HIGH (ref 65–99)
Glucose-Capillary: 275 mg/dL — ABNORMAL HIGH (ref 65–99)

## 2016-10-14 LAB — MAGNESIUM: MAGNESIUM: 2.2 mg/dL (ref 1.7–2.4)

## 2016-10-14 LAB — STREP PNEUMONIAE URINARY ANTIGEN: Strep Pneumo Urinary Antigen: NEGATIVE

## 2016-10-14 LAB — EXPECTORATED SPUTUM ASSESSMENT W REFEX TO RESP CULTURE

## 2016-10-14 LAB — EXPECTORATED SPUTUM ASSESSMENT W GRAM STAIN, RFLX TO RESP C

## 2016-10-14 MED ORDER — PNEUMOCOCCAL VAC POLYVALENT 25 MCG/0.5ML IJ INJ
0.5000 mL | INJECTION | INTRAMUSCULAR | Status: AC
  Administered 2016-10-15: 0.5 mL via INTRAMUSCULAR
  Filled 2016-10-14: qty 0.5

## 2016-10-14 MED ORDER — FLUOXETINE HCL 20 MG PO CAPS: 60.0000 mg | ORAL_CAPSULE | Freq: Every day | ORAL | Status: DC

## 2016-10-14 MED ORDER — METHYLPREDNISOLONE SODIUM SUCC 40 MG IJ SOLR
40.0000 mg | Freq: Four times a day (QID) | INTRAMUSCULAR | Status: DC
  Administered 2016-10-14 – 2016-10-15 (×5): 40 mg via INTRAVENOUS
  Filled 2016-10-14 (×5): qty 1

## 2016-10-14 MED ORDER — GUAIFENESIN-DM 100-10 MG/5ML PO SYRP
5.0000 mL | ORAL_SOLUTION | ORAL | Status: DC | PRN
Start: 2016-10-14 — End: 2016-10-15
  Administered 2016-10-14 – 2016-10-15 (×2): 5 mL via ORAL
  Filled 2016-10-14 (×2): qty 10

## 2016-10-14 MED ORDER — HYDROXYZINE HCL 25 MG PO TABS
50.0000 mg | ORAL_TABLET | Freq: Two times a day (BID) | ORAL | Status: DC
  Administered 2016-10-14 – 2016-10-15 (×4): 50 mg via ORAL
  Filled 2016-10-14 (×4): qty 2

## 2016-10-14 MED ORDER — IBUPROFEN 200 MG PO TABS
400.0000 mg | ORAL_TABLET | Freq: Four times a day (QID) | ORAL | Status: DC | PRN
  Administered 2016-10-14: 400 mg via ORAL
  Filled 2016-10-14: qty 2

## 2016-10-14 MED ORDER — FLUOXETINE HCL 20 MG PO CAPS
40.0000 mg | ORAL_CAPSULE | Freq: Every day | ORAL | Status: DC
  Administered 2016-10-14 – 2016-10-15 (×2): 40 mg via ORAL
  Filled 2016-10-14 (×2): qty 2

## 2016-10-14 MED ORDER — PALIPERIDONE ER 6 MG PO TB24
6.0000 mg | ORAL_TABLET | Freq: Every day | ORAL | Status: DC
  Administered 2016-10-14 (×2): 6 mg via ORAL
  Filled 2016-10-14 (×2): qty 1

## 2016-10-14 MED ORDER — LEVALBUTEROL HCL 1.25 MG/0.5ML IN NEBU
1.2500 mg | INHALATION_SOLUTION | Freq: Four times a day (QID) | RESPIRATORY_TRACT | Status: DC
  Administered 2016-10-14 – 2016-10-15 (×4): 1.25 mg via RESPIRATORY_TRACT
  Filled 2016-10-14 (×4): qty 0.5

## 2016-10-14 MED ORDER — INFLUENZA VAC SPLIT QUAD 0.5 ML IM SUSY
0.5000 mL | PREFILLED_SYRINGE | INTRAMUSCULAR | Status: AC
  Administered 2016-10-15: 0.5 mL via INTRAMUSCULAR
  Filled 2016-10-14: qty 0.5

## 2016-10-14 MED ORDER — SODIUM CHLORIDE 0.9 % IV SOLN
INTRAVENOUS | Status: DC
  Administered 2016-10-14 – 2016-10-15 (×2): via INTRAVENOUS

## 2016-10-14 MED ORDER — IPRATROPIUM BROMIDE 0.02 % IN SOLN
0.5000 mg | Freq: Four times a day (QID) | RESPIRATORY_TRACT | Status: DC
  Administered 2016-10-14 – 2016-10-15 (×4): 0.5 mg via RESPIRATORY_TRACT
  Filled 2016-10-14 (×4): qty 2.5

## 2016-10-14 NOTE — Progress Notes (Signed)
PROGRESS NOTE    QUANISHA DREWRY  KDX:[id number] DOB: 09-May-1985 DOA: 10/13/2016 PCP: Golden Circle, FNP    Brief Narrative:  [name] is a 31 y.o. female with medical history significant for persistent asthma, seasonal allergies, anxiety, and borderline personality disorder, now presenting to the emergency department for evaluation of subjective fevers, chills, cough, wheezing, and shortness of breath. Patient reports that she had been in her usual state of health until 4 days ago when she noted the insidious development of subjective fevers with chills, nonproductive cough, and slight dyspnea with exertion. Since that time, she has been experiencing recurrent subjective fevers, worsening cough wheezing, and is now very dyspneic at rest, despite her use of albuterol and inhaled steroids at home. She went to urgent care today for evaluation of this, was noted to be in acute distress and markedly tachycardic, and was directed to the ED for further evaluation.  ED Course: Upon arrival to the ED, patient is found to be afebrile, saturating adequately on room air, tachypneic, tachycardic to 160, and with stable blood pressure. EKG features a sinus tachycardia with rate 138, PVC, and QTc interval prolonged to 534 ms. Condition panels notable for potassium of 2.8, bicarbonate of 18, and glucose of 152. CBC features a leukocytosis to 13,700. Troponin is undetectable. Chest x-ray features mild bronchitic changes. CTA chest was obtained and negative for PE, but notable for ground-glass opacities in the bilateral lungs concerning for an inflammatory or infectious etiology. Patient was treated with continuous albuterol neb in the ED, 125 mg of IV Solu-Medrol, 2 L normal saline, 60 mEq of oral potassium, Ativan, IV Rocephin, and IV azithromycin. Tachycardia improved but persisted, blood pressure remained stable, patient had slight subjective improvement in her breathing, but remains in mild distress  despite resting in her bed. She will be admitted to the telemetry unit for ongoing evaluation and management of asthma with acute exacerbation, likely precipitated by bronchopneumonia.  Assessment & Plan:   Principal Problem:   Acute asthma exacerbation Active Problems:   Borderline personality disorder   Class 2 obesity due to excess calories without serious comorbidity with body mass index (BMI) of 37.0 to 37.9 in adult   Bronchopneumonia   Hypokalemia   1-Asthma Acute exacerbation;  Will add IV solumedrol, continue with schedule nebulizer treatments.   2-Bronchopneumonia;  Ceftriaxone and doxy.    Anxiety, borderline personality disorder Continue with paliperidone.  Resume Prozac, vistaril.   Hypokalemia; replaced.   Prolong QT; improved. Electrolytes corrected. Repeat EKG in am after resumption of medications.   Diarrhea; if spike fever, or develops abdominal pain, will consider further testing.   Metabolic acidosis. IV fluids.   DVT prophylaxis: Lovenox Code Status: full code.   Family Communication: care discussed with patient.  Disposition Plan: home in 24 to 48 hours.   Consultants:   none   Procedures: none   Antimicrobials:    Subjective: She is breathing better, not back at baseline. Report productive cough   Objective: Vitals:   10/14/16 0629 10/14/16 0812 10/14/16 1215 10/14/16 1406  BP: (!) 97/58   108/69  Pulse: 100   96  Resp: 20   19  Temp: 98.7 F (37.1 C)   98.5 F (36.9 C)  TempSrc: Oral   Oral  SpO2: 94% 97% 98% 97%  Weight:      Height:        Intake/Output Summary (Last 24 hours) at 10/14/16 1615 Last data filed at 10/14/16 1500  Gross  per 24 hour  Intake          2022.92 ml  Output              300 ml  Net          1722.92 ml   Filed Weights   10/13/16 1831 10/13/16 1841 10/14/16 0027  Weight: 97.1 kg (214 lb) 97.1 kg (214 lb) 98.8 kg (217 lb 12.8 oz)    Examination:  General exam: Appears calm and comfortable    Respiratory system: Bilateral expiratory wheezing. Respiratory effort normal. Cardiovascular system: S1 & S2 heard, RRR. No JVD, murmurs, rubs, gallops or clicks. No pedal edema. Gastrointestinal system: Abdomen is nondistended, soft and nontender. No organomegaly or masses felt. Normal bowel sounds heard. Central nervous system: Alert and oriented. No focal neurological deficits. Extremities: Symmetric 5 x 5 power. Skin: No rashes, lesions or ulcers Psychiatry: Judgement and insight appear normal. Mood & affect appropriate.     Data Reviewed: I have personally reviewed following labs and imaging studies  CBC:  Recent Labs Lab 10/13/16 1851  WBC 13.7*  HGB 14.0  HCT 41.2  MCV 88.6  PLT 102   Basic Metabolic Panel:  Recent Labs Lab 10/13/16 1851 10/14/16 1349  NA 133* 135  K 2.8* 4.3  CL 102 109  CO2 18* 20*  GLUCOSE 152* 118*  BUN <5* <5*  CREATININE 0.80 0.64  CALCIUM 8.6* 8.8*  MG  --  2.2   GFR: Estimated Creatinine Clearance: 115 mL/min (by C-G formula based on SCr of 0.64 mg/dL). Liver Function Tests: No results for input(s): AST, ALT, ALKPHOS, BILITOT, PROT, ALBUMIN in the last 168 hours. No results for input(s): LIPASE, AMYLASE in the last 168 hours. No results for input(s): AMMONIA in the last 168 hours. Coagulation Profile: No results for input(s): INR, PROTIME in the last 168 hours. Cardiac Enzymes: No results for input(s): CKTOTAL, CKMB, CKMBINDEX, TROPONINI in the last 168 hours. BNP (last 3 results) No results for input(s): PROBNP in the last 8760 hours. HbA1C: No results for input(s): HGBA1C in the last 72 hours. CBG:  Recent Labs Lab 10/14/16 0056 10/14/16 0740 10/14/16 1202 10/14/16 1601  GLUCAP 275* 145* 138* 132*   Lipid Profile: No results for input(s): CHOL, HDL, LDLCALC, TRIG, CHOLHDL, LDLDIRECT in the last 72 hours. Thyroid Function Tests: No results for input(s): TSH, T4TOTAL, FREET4, T3FREE, THYROIDAB in the last 72  hours. Anemia Panel: No results for input(s): VITAMINB12, FOLATE, FERRITIN, TIBC, IRON, RETICCTPCT in the last 72 hours. Sepsis Labs: No results for input(s): PROCALCITON, LATICACIDVEN in the last 168 hours.  Recent Results (from the past 240 hour(s))  Culture, sputum-assessment     Status: None   Collection Time: 10/14/16  8:45 AM  Result Value Ref Range Status   Specimen Description SPUTUM  Final   Special Requests NONE  Final   Sputum evaluation   Final    Sputum specimen not acceptable for testing.  Please recollect.   INFORMED JESSICA, RN @0908  ON 9.12.18 BY NMCCOY    Report Status 10/14/2016 FINAL  Final         Radiology Studies: Dg Chest 2 View  Result Date: 10/13/2016 CLINICAL DATA:  Pt c/o sob, cp, cough, weakness x 3 days. Hx anxiety. EXAM: CHEST  2 VIEW COMPARISON:  10/13/2016 FINDINGS: There is minimal perihilar peribronchial thickening. No focal consolidations or pleural effusions. No pulmonary edema. IMPRESSION: Mild bronchitic changes. Electronically Signed   By: Nolon Nations M.D.  On: 10/13/2016 19:38   Dg Chest 2 View  Result Date: 10/13/2016 CLINICAL DATA:  Wheezing with shortness of breath. History of asthma. EXAM: CHEST  2 VIEW COMPARISON:  04/22/2015 and 03/30/2015. FINDINGS: The heart size and mediastinal contours are stable. The lungs are clear and not significantly hyperinflated. There is probable mild chronic central airway thickening. No pleural effusion or pneumothorax. The bones appear unremarkable. IMPRESSION: Suspected mild chronic central airway thickening consistent with asthma. No hyperinflation or acute findings. Electronically Signed   By: Richardean Sale M.D.   On: 10/13/2016 16:53   Ct Angio Chest Pe W/cm &/or Wo Cm  Result Date: 10/13/2016 CLINICAL DATA:  PE suspected, high pretest prob. Fever and chills. Shortness of breath. EXAM: CT ANGIOGRAPHY CHEST WITH CONTRAST TECHNIQUE: Multidetector CT imaging of the chest was performed using the  standard protocol during bolus administration of intravenous contrast. Multiplanar CT image reconstructions and MIPs were obtained to evaluate the vascular anatomy. CONTRAST:  86 cc Isovue 370 IV COMPARISON:  Chest radiographs earlier this day. FINDINGS: Cardiovascular: There are no filling defects within the pulmonary arteries to suggest pulmonary embolus. Motion artifact limits detailed assessment particularly in the lower lobes. Normal caliber thoracic aorta. No aortic dissection. The heart is normal in size. No pericardial effusion. Mediastinum/Nodes: Prominent right hilar node measuring 8 mm, not enlarged by size criteria. Shotty mediastinal lymph nodes. No enlarged mediastinal, hilar, or axillary adenopathy. Esophagus decompressed. Visualized thyroid gland is normal. Lungs/Pleura: Patchy geographic ground-glass opacities throughout both lungs, greatest in the right upper lobe, nonspecific. There is central bronchial thickening. No septal thickening. No pulmonary mass. No pleural fluid. Upper Abdomen: No acute abnormality. Musculoskeletal: There are no acute or suspicious osseous abnormalities. Review of the MIP images confirms the above findings. IMPRESSION: 1. No pulmonary embolus. 2. Geographic ground-glass opacities throughout both lungs, nonspecific. In the setting of fever and chills likely infectious bronchiolitis. Inflammatory etiologies are also considered. There is central bronchial thickening. Electronically Signed   By: Jeb Levering M.D.   On: 10/13/2016 21:12        Scheduled Meds: . albuterol  5 mg Nebulization Once  . budesonide (PULMICORT) nebulizer solution  0.25 mg Nebulization BID  . doxycycline  100 mg Oral Q12H  . enoxaparin (LOVENOX) injection  40 mg Subcutaneous QHS  . FLUoxetine  40 mg Oral Daily  . gabapentin  300 mg Oral TID  . hydrOXYzine  50 mg Oral BID  . insulin aspart  0-5 Units Subcutaneous QHS  . insulin aspart  0-9 Units Subcutaneous TID WC  . ipratropium   0.5 mg Nebulization Q6H  . levalbuterol  1.25 mg Nebulization Q6H  . methylPREDNISolone (SOLU-MEDROL) injection  40 mg Intravenous Q6H  . norethindrone-ethinyl estradiol (OVCON-50) 1-50 MG-MCG tablet  1 tablet Oral Daily  . paliperidone  6 mg Oral QHS  . [START ON 10/15/2016] pneumococcal 23 valent vaccine  0.5 mL Intramuscular Tomorrow-1000   Continuous Infusions: . sodium chloride    . cefTRIAXone (ROCEPHIN)  IV Stopped (10/13/16 2318)     LOS: 1 day    Time spent: 35 minutes.     Elmarie Shiley, MD [facility] Pager [phone]  If 7PM-7AM, please contact night-coverage [email or web address] Password [facility] 10/14/2016, 4:15 PM

## 2016-10-14 NOTE — Progress Notes (Signed)
Patient with 3 episodes of green, watery diarrhea since admission. Patient denies any nausea or abdominal pain. On doxycycline for treatment of asthma exacerbation. MD made aware of diarrhea. No orders for testing at this time as patient is asymptomatic. Will continue to monitor.   [name] [facility] 10/14/2016 12:29 PM

## 2016-10-15 LAB — BASIC METABOLIC PANEL
ANION GAP: 7 (ref 5–15)
CALCIUM: 8.6 mg/dL — AB (ref 8.9–10.3)
CO2: 19 mmol/L — ABNORMAL LOW (ref 22–32)
CREATININE: 0.61 mg/dL (ref 0.44–1.00)
Chloride: 111 mmol/L (ref 101–111)
GFR calc Af Amer: >60 mL/min (ref >60)
GFR calc non Af Amer: >60 mL/min (ref >60)
GLUCOSE: 151 mg/dL — AB (ref 65–99)
Potassium: 4 mmol/L (ref 3.5–5.1)
Sodium: 137 mmol/L (ref 135–145)

## 2016-10-15 LAB — LEGIONELLA PNEUMOPHILA SEROGP 1 UR AG: L. pneumophila Serogp 1 Ur Ag: NEGATIVE

## 2016-10-15 LAB — GLUCOSE, CAPILLARY: Glucose-Capillary: 140 mg/dL — ABNORMAL HIGH (ref 65–99)

## 2016-10-15 LAB — HIV ANTIBODY (ROUTINE TESTING W REFLEX): HIV Screen 4th Generation wRfx: NONREACTIVE

## 2016-10-15 MED ORDER — IPRATROPIUM BROMIDE 0.02 % IN SOLN: 0.5000 mg | Freq: Four times a day (QID) | RESPIRATORY_TRACT | 12 refills | Status: DC

## 2016-10-15 MED ORDER — DOXYCYCLINE HYCLATE 100 MG PO TABS: 100.0000 mg | ORAL_TABLET | Freq: Two times a day (BID) | ORAL | 0 refills | Status: DC

## 2016-10-15 MED ORDER — PREDNISONE 20 MG PO TABS: ORAL_TABLET | ORAL | 0 refills | Status: DC

## 2016-10-15 MED ORDER — CEFUROXIME AXETIL 500 MG PO TABS: 500.0000 mg | ORAL_TABLET | Freq: Two times a day (BID) | ORAL | 0 refills | Status: AC

## 2016-10-15 MED ORDER — FLUOXETINE HCL 40 MG PO CAPS: 40.0000 mg | ORAL_CAPSULE | Freq: Every day | ORAL | 0 refills | Status: DC

## 2016-10-15 MED ORDER — GUAIFENESIN-DM 100-10 MG/5ML PO SYRP: 5.0000 mL | ORAL_SOLUTION | ORAL | 0 refills | Status: DC | PRN

## 2016-10-15 NOTE — Progress Notes (Signed)
Patient is stable for discharge. Discharge instructions and medications have been reviewed with the patient and all questions answered. Prescriptions and AVS given to patient.   [name] [facility] 10/15/2016 11:00 AM

## 2016-10-15 NOTE — Care Management Note (Signed)
Case Management Note  Patient Details  Name: [name] MRN: [id number] Date of Birth: [birth year]  Subjective/Objective:   31 y/o f admitted w/Acute asthma. From home.                 Action/Plan:d/c plan home.   Expected Discharge Date:  10/15/16               Expected Discharge Plan:  Home/Self Care  In-House Referral:     Discharge planning Services  CM Consult  Post Acute Care Choice:    Choice offered to:     DME Arranged:    DME Agency:     HH Arranged:    HH Agency:     Status of Service:  Completed, signed off  If discussed at [employer] of Stay Meetings, dates discussed:    Additional Comments:  Dessa Phi, RN 10/15/2016, 10:27 AM

## 2016-10-15 NOTE — Discharge Summary (Signed)
Physician Discharge Summary  [name] [id number]:[id number] DOB: 1985/03/12 DOA: 10/13/2016  PCP: Golden Circle, FNP  Admit date: 10/13/2016 Discharge date: 10/15/2016  Admitted From:Home  Disposition:  Home   Recommendations for Outpatient Follow-up:  1. Follow up with PCP in 1-2 weeks 2. Please obtain BMP/CBC in one week 3. Further care for asthma.  4. Needs EKG to follow QT   Home Health: yes   Discharge Condition: stable.  CODE STATUS: full code.  Diet recommendation: Heart Healthy   Brief/Interim Summary: [name] a 31 y.o.femalewith medical history significant for persistent asthma, seasonal allergies, anxiety, and borderline personality disorder, now presenting to the emergency department for evaluation of subjective fevers, chills, cough, wheezing, and shortness of breath. Patient reports that she had been in her usual state of health until 4 days ago when she noted the insidious development of subjective fevers with chills, nonproductive cough, and slight dyspnea with exertion. Since that time, she has been experiencing recurrent subjective fevers, worsening cough wheezing, and is now very dyspneic at rest, despite her use of albuterol and inhaled steroids at home. She went to urgent care today for evaluation of this, was noted to be in acute distress and markedly tachycardic, and was directed to the ED for further evaluation.  ED Course:Upon arrival to the ED, patient is found to be afebrile, saturating adequately on room air, tachypneic, tachycardic to 160, and with stable blood pressure. EKG features a sinus tachycardia with rate 138, PVC, and QTc interval prolonged to 534 ms. Condition panels notable for potassium of 2.8, bicarbonate of 18, and glucose of 152. CBC features a leukocytosis to 13,700. Troponin is undetectable. Chest x-ray features mild bronchitic changes. CTA chest was obtained and negative for PE, but notable for ground-glass opacities in the  bilateral lungs concerning for an inflammatory or infectious etiology. Patient was treated with continuous albuterol neb in the ED, 125 mg of IV Solu-Medrol, 2 L normal saline, 60 mEq of oral potassium, Ativan, IV Rocephin, and IV azithromycin. Tachycardia improved but persisted, blood pressure remained stable, patient had slight subjective improvement in her breathing, but remains in mild distress despite resting in her bed. She will be admitted to the telemetry unit for ongoing evaluation and management of asthma with acute exacerbation, likely precipitated by bronchopneumonia.  Assessment & Plan:   Principal Problem:   Acute asthma exacerbation Active Problems:   Borderline personality disorder   Class 2 obesity due to excess calories without serious comorbidity with body mass index (BMI) of 37.0 to 37.9 in adult   Bronchopneumonia   Hypokalemia   1-Asthma Acute exacerbation;  Treated with IV solumedrol, continue with schedule nebulizer treatments.  improved. Plan to discharge on prednisone taper.   2-Bronchopneumonia;  Ceftriaxone and doxy.  discharge on Ceftin and doxy to complete 7 day treatment,.    Anxiety, borderline personality disorder Continue with paliperidone.  Resume Prozac, vistaril.   Hypokalemia; replaced.   Prolong QT; improved. Electrolytes corrected. EKG ; QT no significantly prolong  Needs repeat QT outpatient.   Diarrhea; resolved.   Metabolic acidosis. IV fluids. Improved. Advised patient to keep herself hydrated.    Discharge Diagnoses:  Principal Problem:   Acute asthma exacerbation Active Problems:   Borderline personality disorder   Class 2 obesity due to excess calories without serious comorbidity with body mass index (BMI) of 37.0 to 37.9 in adult   Bronchopneumonia   Hypokalemia    Discharge Instructions  Discharge Instructions    Diet -  low sodium heart healthy    Complete by:  As directed    Increase activity slowly     Complete by:  As directed      Allergies as of 10/15/2016      Reactions   Cogentin [benztropine]       Medication List    TAKE these medications   albuterol (2.5 MG/3ML) 0.083% nebulizer solution Commonly known as:  PROVENTIL Take 3 mLs (2.5 mg total) by nebulization every 6 (six) hours as needed for wheezing or shortness of breath. What changed:  Another medication with the same name was removed. Continue taking this medication, and follow the directions you see here.   cefUROXime 500 MG tablet Commonly known as:  CEFTIN Take 1 tablet (500 mg total) by mouth 2 (two) times daily.   doxycycline 100 MG tablet Commonly known as:  VIBRA-TABS Take 1 tablet (100 mg total) by mouth every 12 (twelve) hours.   FLUoxetine 40 MG capsule Commonly known as:  PROZAC Take 1 capsule (40 mg total) by mouth daily. What changed:  when to take this   fluticasone 44 MCG/ACT inhaler Commonly known as:  FLOVENT HFA Inhale 2 puffs into the lungs 2 (two) times daily.   gabapentin 300 MG capsule Commonly known as:  NEURONTIN Take 1 capsule (300 mg total) by mouth 3 (three) times daily.   guaiFENesin-dextromethorphan 100-10 MG/5ML syrup Commonly known as:  ROBITUSSIN DM Take 5 mLs by mouth every 4 (four) hours as needed for cough (chest congestion).   hydrOXYzine 50 MG tablet Commonly known as:  ATARAX/VISTARIL Take 50 mg by mouth 2 (two) times daily.   ipratropium 0.02 % nebulizer solution Commonly known as:  ATROVENT Take 2.5 mLs (0.5 mg total) by nebulization every 6 (six) hours.   levalbuterol 45 MCG/ACT inhaler Commonly known as:  XOPENEX HFA Inhale 1-2 puffs into the lungs every 6 (six) hours as needed for wheezing.   norethindrone-ethinyl estradiol 1-50 MG-MCG tablet Commonly known as:  OVCON-50 Take 1 tablet by mouth daily.   paliperidone 6 MG 24 hr tablet Commonly known as:  INVEGA Take 1 tablet (6 mg total) by mouth daily.   predniSONE 20 MG tablet Commonly known as:   DELTASONE Take 3 tablets for 2 days then take 2 tablets for 4 days.            Discharge Care Instructions        Start     Ordered   10/15/16 0000  doxycycline (VIBRA-TABS) 100 MG tablet  Every 12 hours     10/15/16 0945   10/15/16 0000  FLUoxetine (PROZAC) 40 MG capsule  Daily     10/15/16 0945   10/15/16 0000  guaiFENesin-dextromethorphan (ROBITUSSIN DM) 100-10 MG/5ML syrup  Every 4 hours PRN     10/15/16 0945   10/15/16 0000  ipratropium (ATROVENT) 0.02 % nebulizer solution  Every 6 hours     10/15/16 0945   10/15/16 0000  cefUROXime (CEFTIN) 500 MG tablet  2 times daily     10/15/16 0945   10/15/16 0000  Increase activity slowly     10/15/16 0945   10/15/16 0000  Diet - low sodium heart healthy     10/15/16 0945   10/15/16 0000  predniSONE (DELTASONE) 20 MG tablet     10/15/16 0945      Allergies  Allergen Reactions  . Cogentin [Benztropine]     Consultations: none  Procedures/Studies: Dg Chest 2 View  Result Date: 10/13/2016 CLINICAL  DATA:  Pt c/o sob, cp, cough, weakness x 3 days. Hx anxiety. EXAM: CHEST  2 VIEW COMPARISON:  10/13/2016 FINDINGS: There is minimal perihilar peribronchial thickening. No focal consolidations or pleural effusions. No pulmonary edema. IMPRESSION: Mild bronchitic changes. Electronically Signed   By: Nolon Nations M.D.   On: 10/13/2016 19:38   Dg Chest 2 View  Result Date: 10/13/2016 CLINICAL DATA:  Wheezing with shortness of breath. History of asthma. EXAM: CHEST  2 VIEW COMPARISON:  04/22/2015 and 03/30/2015. FINDINGS: The heart size and mediastinal contours are stable. The lungs are clear and not significantly hyperinflated. There is probable mild chronic central airway thickening. No pleural effusion or pneumothorax. The bones appear unremarkable. IMPRESSION: Suspected mild chronic central airway thickening consistent with asthma. No hyperinflation or acute findings. Electronically Signed   By: Richardean Sale M.D.   On:  10/13/2016 16:53   Ct Angio Chest Pe W/cm &/or Wo Cm  Result Date: 10/13/2016 CLINICAL DATA:  PE suspected, high pretest prob. Fever and chills. Shortness of breath. EXAM: CT ANGIOGRAPHY CHEST WITH CONTRAST TECHNIQUE: Multidetector CT imaging of the chest was performed using the standard protocol during bolus administration of intravenous contrast. Multiplanar CT image reconstructions and MIPs were obtained to evaluate the vascular anatomy. CONTRAST:  86 cc Isovue 370 IV COMPARISON:  Chest radiographs earlier this day. FINDINGS: Cardiovascular: There are no filling defects within the pulmonary arteries to suggest pulmonary embolus. Motion artifact limits detailed assessment particularly in the lower lobes. Normal caliber thoracic aorta. No aortic dissection. The heart is normal in size. No pericardial effusion. Mediastinum/Nodes: Prominent right hilar node measuring 8 mm, not enlarged by size criteria. Shotty mediastinal lymph nodes. No enlarged mediastinal, hilar, or axillary adenopathy. Esophagus decompressed. Visualized thyroid gland is normal. Lungs/Pleura: Patchy geographic ground-glass opacities throughout both lungs, greatest in the right upper lobe, nonspecific. There is central bronchial thickening. No septal thickening. No pulmonary mass. No pleural fluid. Upper Abdomen: No acute abnormality. Musculoskeletal: There are no acute or suspicious osseous abnormalities. Review of the MIP images confirms the above findings. IMPRESSION: 1. No pulmonary embolus. 2. Geographic ground-glass opacities throughout both lungs, nonspecific. In the setting of fever and chills likely infectious bronchiolitis. Inflammatory etiologies are also considered. There is central bronchial thickening. Electronically Signed   By: Jeb Levering M.D.   On: 10/13/2016 21:12     Subjective: Feeling better , breathing at baseline. \  Discharge Exam: Vitals:   10/15/16 0743 10/15/16 0749  BP:    Pulse:    Resp:    Temp:     SpO2: 96% 96%   Vitals:   10/14/16 2021 10/15/16 0500 10/15/16 0743 10/15/16 0749  BP: 133/87 131/67    Pulse: (!) 102 89    Resp: 20 18    Temp: 98.1 F (36.7 C) 98.6 F (37 C)    TempSrc: Oral Oral    SpO2: 96% 97% 96% 96%  Weight:      Height:        General: Pt is alert, awake, not in acute distress Cardiovascular: RRR, S1/S2 +, no rubs, no gallops Respiratory: CTA bilaterally, no wheezing, no rhonchi Abdominal: Soft, NT, ND, bowel sounds + Extremities: no edema, no cyanosis    The results of significant diagnostics from this hospitalization (including imaging, microbiology, ancillary and laboratory) are listed below for reference.     Microbiology: Recent Results (from the past 240 hour(s))  Culture, sputum-assessment     Status: None   Collection Time: 10/14/16  8:45 AM  Result Value Ref Range Status   Specimen Description SPUTUM  Final   Special Requests NONE  Final   Sputum evaluation   Final    Sputum specimen not acceptable for testing.  Please recollect.   INFORMED JESSICA, RN @0908  ON 9.12.18 BY NMCCOY    Report Status 10/14/2016 FINAL  Final     Labs: BNP (last 3 results) No results for input(s): BNP in the last 8760 hours. Basic Metabolic Panel:  Recent Labs Lab 10/13/16 1851 10/14/16 1349 10/15/16 0519  NA 133* 135 137  K 2.8* 4.3 4.0  CL 102 109 111  CO2 18* 20* 19*  GLUCOSE 152* 118* 151*  BUN <5* <5* <5*  CREATININE 0.80 0.64 0.61  CALCIUM 8.6* 8.8* 8.6*  MG  --  2.2  --    Liver Function Tests: No results for input(s): AST, ALT, ALKPHOS, BILITOT, PROT, ALBUMIN in the last 168 hours. No results for input(s): LIPASE, AMYLASE in the last 168 hours. No results for input(s): AMMONIA in the last 168 hours. CBC:  Recent Labs Lab 10/13/16 1851  WBC 13.7*  HGB 14.0  HCT 41.2  MCV 88.6  PLT 268   Cardiac Enzymes: No results for input(s): CKTOTAL, CKMB, CKMBINDEX, TROPONINI in the last 168 hours. BNP: Invalid input(s):  POCBNP CBG:  Recent Labs Lab 10/14/16 0740 10/14/16 1202 10/14/16 1601 10/14/16 2140 10/15/16 0753  GLUCAP 145* 138* 132* 180* 140*   D-Dimer No results for input(s): DDIMER in the last 72 hours. Hgb A1c No results for input(s): HGBA1C in the last 72 hours. Lipid Profile No results for input(s): CHOL, HDL, LDLCALC, TRIG, CHOLHDL, LDLDIRECT in the last 72 hours. Thyroid function studies No results for input(s): TSH, T4TOTAL, T3FREE, THYROIDAB in the last 72 hours.  Invalid input(s): FREET3 Anemia work up No results for input(s): VITAMINB12, FOLATE, FERRITIN, TIBC, IRON, RETICCTPCT in the last 72 hours. Urinalysis    Component Value Date/Time   COLORURINE AMBER BIOCHEMICALS MAY BE AFFECTED BY COLOR (A) 11/20/2009 1825   APPEARANCEUR CLOUDY (A) 11/20/2009 1825   LABSPEC 1.030 11/20/2009 1825   PHURINE 6.5 11/20/2009 1825   GLUCOSEU NEGATIVE 11/20/2009 1825   HGBUR NEGATIVE 11/20/2009 1825   BILIRUBINUR NEGATIVE 11/20/2009 1825   KETONESUR 15 (A) 11/20/2009 1825   PROTEINUR NEGATIVE 11/20/2009 1825   UROBILINOGEN 1.0 11/20/2009 1825   NITRITE NEGATIVE 11/20/2009 1825   LEUKOCYTESUR  11/20/2009 1825    NEGATIVE MICROSCOPIC NOT DONE ON URINES WITH NEGATIVE PROTEIN, BLOOD, LEUKOCYTES, NITRITE, OR GLUCOSE <1000 mg/dL.   Sepsis Labs Invalid input(s): PROCALCITONIN,  WBC,  LACTICIDVEN Microbiology Recent Results (from the past 240 hour(s))  Culture, sputum-assessment     Status: None   Collection Time: 10/14/16  8:45 AM  Result Value Ref Range Status   Specimen Description SPUTUM  Final   Special Requests NONE  Final   Sputum evaluation   Final    Sputum specimen not acceptable for testing.  Please recollect.   INFORMED JESSICA, RN @0908  ON 9.12.18 BY NMCCOY    Report Status 10/14/2016 FINAL  Final     Time coordinating discharge: Over 30 minutes  SIGNED:   Elmarie Shiley, MD  [facility] 10/15/2016, 9:45 AM Pager   If 7PM-7AM, please contact  night-coverage [email or web address] Password [email or web address]

## 2016-10-26 ENCOUNTER — Encounter: Payer: Self-pay | Admitting: Family

## 2016-10-26 ENCOUNTER — Ambulatory Visit (INDEPENDENT_AMBULATORY_CARE_PROVIDER_SITE_OTHER): Payer: 59 | Admitting: Family

## 2016-10-26 VITALS — BP 108/70 | HR 113 | Temp 99.1°F | Resp 16 | Ht 62.0 in | Wt 218.0 lb

## 2016-10-26 DIAGNOSIS — J180 Bronchopneumonia, unspecified organism: Secondary | ICD-10-CM | POA: Diagnosis not present

## 2016-10-26 DIAGNOSIS — F419 Anxiety disorder, unspecified: Secondary | ICD-10-CM | POA: Diagnosis not present

## 2016-10-26 MED ORDER — AMOXICILLIN-POT CLAVULANATE 875-125 MG PO TABS: 1.0000 | ORAL_TABLET | Freq: Two times a day (BID) | ORAL | 0 refills | Status: DC

## 2016-10-26 MED ORDER — LEVALBUTEROL HCL 0.63 MG/3ML IN NEBU: 0.6300 mg | INHALATION_SOLUTION | Freq: Three times a day (TID) | RESPIRATORY_TRACT | 1 refills | Status: DC | PRN

## 2016-10-26 MED ORDER — FLUCONAZOLE 150 MG PO TABS: ORAL_TABLET | ORAL | 0 refills | Status: DC

## 2016-10-26 MED ORDER — LORAZEPAM 0.5 MG PO TABS: 0.5000 mg | ORAL_TABLET | Freq: Two times a day (BID) | ORAL | 0 refills | Status: DC | PRN

## 2016-10-26 NOTE — Progress Notes (Signed)
Subjective:    [name], female    DOB: 03/16/1985, 31 y.o.   MRN: [id number]  Chief Complaint  Patient presents with  . Hospitalization Follow-up    still does not feel well, states she is very tired and still feels sick     HPI:  [name] is a 31 y.o. female who  has a past medical history of Allergy; Asthma; and Borderline personality disorder. and presents today for a follow up office visit.  Asthma exacerbation -  Recently evaluated in the emergency department and admitted to the hospital with the chief complaint of tachycardia and shortness of breath. No fever at the time. On physical exam she was noted to have tachycardia and decreased breath sounds in the bilateral lower fields. X-rays showed mild chronic central airway thickening consistent with asthma and no hyperinflation or acute findings. She received 2 doses of albuterol and a chest CT was negative for pulmonary embolism but did show possible infectious bronchiolitis. She was treated with IV Solu-Medrol and continued on nebulizer treatments and treated with ceftriaxone and doxycycline. Ceftriaxone was changed to Ceftin on discharge and completed a 7 day course of Ceftin/doxycycline. She was also noted to have prolonged QT syndrome which was improved with electrolyte correction.  Recommended to follow-up with primary care in 1-2 weeks and follow up on EKG 4 QT interval.All hospital records, labs, and imaging reviewed in detail.  Since leaving the hospital she reports completing the course of Ceftin/doxycycline as well as prednisone. Initially felt improvement and then worsening. Seen in urgent care with reassurance and negative chest x-ray for pneumonia. She continues to experience the associated symptoms of malaise, weakness, and thick mucus and drainage. Most recently noted a new onset sore throat. Describes overall course of symptoms to be unchanged and is "tired of being sick". No fevers at home.  Modifying factors include Xopenex which does help with her breathing.   Allergies  Allergen Reactions  . Cogentin [Benztropine]       Outpatient Medications Prior to Visit  Medication Sig Dispense Refill  . FLUoxetine (PROZAC) 40 MG capsule Take 1 capsule (40 mg total) by mouth daily. 30 capsule 0  . fluticasone (FLOVENT HFA) 44 MCG/ACT inhaler Inhale 2 puffs into the lungs 2 (two) times daily. 1 Inhaler 2  . gabapentin (NEURONTIN) 300 MG capsule Take 1 capsule (300 mg total) by mouth 3 (three) times daily. 90 capsule 2  . guaiFENesin-dextromethorphan (ROBITUSSIN DM) 100-10 MG/5ML syrup Take 5 mLs by mouth every 4 (four) hours as needed for cough (chest congestion). 118 mL 0  . hydrOXYzine (ATARAX/VISTARIL) 50 MG tablet Take 50 mg by mouth 2 (two) times daily.     [name] ipratropium (ATROVENT) 0.02 % nebulizer solution Take 2.5 mLs (0.5 mg total) by nebulization every 6 (six) hours. 75 mL 12  . levalbuterol (XOPENEX HFA) 45 MCG/ACT inhaler Inhale 1-2 puffs into the lungs every 6 (six) hours as needed for wheezing. 1 Inhaler 5  . norethindrone-ethinyl estradiol (OVCON-50) 1-50 MG-MCG tablet Take 1 tablet by mouth daily.    . paliperidone (INVEGA) 6 MG 24 hr tablet Take 1 tablet (6 mg total) by mouth daily. 30 tablet 2  . albuterol (PROVENTIL) (2.5 MG/3ML) 0.083% nebulizer solution Take 3 mLs (2.5 mg total) by nebulization every 6 (six) hours as needed for wheezing or shortness of breath. 75 mL 12  . doxycycline (VIBRA-TABS) 100 MG tablet Take 1 tablet (100 mg total) by mouth every 12 (twelve)  hours. 10 tablet 0  . predniSONE (DELTASONE) 20 MG tablet Take 3 tablets for 2 days then take 2 tablets for 4 days. 14 tablet 0   No facility-administered medications prior to visit.       Past Surgical History:  Procedure Laterality Date  . Thumb surgery Left       Past Medical History:  Diagnosis Date  . Allergy   . Asthma   . Borderline personality disorder       Review of Systems    Constitutional: Positive for fatigue. Negative for chills and unexpected weight change.  HENT: Positive for congestion and sore throat. Negative for sinus pain and sinus pressure.   Respiratory: Positive for cough and wheezing. Negative for chest tightness and shortness of breath.   Cardiovascular: Negative for chest pain, palpitations and leg swelling.  Neurological: Negative for headaches.      Objective:    BP 108/70 (BP Location: Left Arm, Patient Position: Sitting, Cuff Size: Normal)   Pulse (!) 113   Temp 99.1 F (37.3 C) (Oral)   Resp 16   Ht 5\' 2"  (1.575 m)   Wt 218 lb (98.9 kg)   LMP 09/28/2016   SpO2 97%   BMI 39.87 kg/m  Nursing note and vital signs reviewed.  Physical Exam  Constitutional: She is oriented to person, place, and time. She appears well-developed and well-nourished. No distress.  HENT:  Right Ear: Hearing, tympanic membrane, external ear and ear canal normal.  Left Ear: Hearing, tympanic membrane, external ear and ear canal normal.  Nose: Right sinus exhibits no maxillary sinus tenderness and no frontal sinus tenderness. Left sinus exhibits no maxillary sinus tenderness and no frontal sinus tenderness.  Mouth/Throat: Uvula is midline, oropharynx is clear and moist and mucous membranes are normal.  Cardiovascular: Normal rate, regular rhythm, normal heart sounds and intact distal pulses.  Exam reveals no gallop and no friction rub.   No murmur heard. Pulmonary/Chest: Effort normal. No respiratory distress. She has wheezes. She has rales. She exhibits no tenderness.  Neurological: She is alert and oriented to person, place, and time.  Skin: Skin is warm and dry.  Psychiatric: She has a normal mood and affect. Her behavior is normal. Judgment and thought content normal.       Assessment & Plan:   Problem List Items Addressed This Visit      Respiratory   Bronchopneumonia - Primary    Symptoms and exam are consistent with bronchpneumonia. Start  sample of Breo. Start Augmentin secondary to multiple interactions with with Invega. Continue current dosage of Xopenex secondary to noted tachycardia. Unlikely SIRS or sepsis currently. Follow up if symptoms worsen or do not improve.       Relevant Medications   levalbuterol (XOPENEX) 0.63 MG/3ML nebulizer solution   fluconazole (DIFLUCAN) 150 MG tablet   amoxicillin-clavulanate (AUGMENTIN) 875-125 MG tablet     Other   Anxiety    Increased levels of anxiety secondary to recent sickness and poorly controlled with Invega. Start low dose lorazepam as needed. Follow up with psychiatry if symptoms worsen or do not improve.       Relevant Medications   LORazepam (ATIVAN) 0.5 MG tablet       I have discontinued Ms. Rabalais's albuterol, doxycycline, and predniSONE. I am also having her start on LORazepam, levalbuterol, fluconazole, and amoxicillin-clavulanate. Additionally, I am having her maintain her norethindrone-ethinyl estradiol, paliperidone, gabapentin, hydrOXYzine, levalbuterol, fluticasone, FLUoxetine, guaiFENesin-dextromethorphan, and ipratropium.   Meds ordered this encounter  Medications  . LORazepam (ATIVAN) 0.5 MG tablet    Sig: Take 1 tablet (0.5 mg total) by mouth 2 (two) times daily as needed for anxiety.    Dispense:  10 tablet    Refill:  0    Order Specific Question:   Supervising Provider    Answer:   Pricilla Holm A [[id number]]  . levalbuterol (XOPENEX) 0.63 MG/3ML nebulizer solution    Sig: Take 3 mLs (0.63 mg total) by nebulization every 8 (eight) hours as needed for wheezing or shortness of breath.    Dispense:  90 mL    Refill:  1    Order Specific Question:   Supervising Provider    Answer:   Pricilla Holm A [[id number]]  . fluconazole (DIFLUCAN) 150 MG tablet    Sig: Take 1 tablet by mouth every 72 hours as needed.    Dispense:  3 tablet    Refill:  0    Order Specific Question:   Supervising Provider    Answer:   Pricilla Holm A [[id number]]  .  amoxicillin-clavulanate (AUGMENTIN) 875-125 MG tablet    Sig: Take 1 tablet by mouth 2 (two) times daily.    Dispense:  14 tablet    Refill:  0    Order Specific Question:   Supervising Provider    Answer:   Pricilla Holm A [[id number]]     Follow-up: Return if symptoms worsen or fail to improve.  Mauricio Po, FNP

## 2016-10-26 NOTE — Assessment & Plan Note (Signed)
Increased levels of anxiety secondary to recent sickness and poorly controlled with Invega. Start low dose lorazepam as needed. Follow up with psychiatry if symptoms worsen or do not improve.

## 2016-10-26 NOTE — Patient Instructions (Signed)
Thank you for choosing [facility].  SUMMARY AND INSTRUCTIONS:  Please start the Augmentin.  Change the Xopenex for the albuterol.  Use the lorazepam as needed for anxiety.  Start the [place] once daily.  Fluconazole has been sent for your yeast infection symptoms.  Consider a probiotic to help prevent yeast infection with yogurt, Culturelle or Align.   Follow up if symptoms worsen.   Medication:  Your prescription(s) have been submitted to your pharmacy or been printed and provided for you. Please take as directed and contact our office if you believe you are having problem(s) with the medication(s) or have any questions.   Follow up:  If your symptoms worsen or fail to improve, please contact our office for further instruction, or in case of emergency go directly to the emergency room at the closest medical facility.

## 2016-10-26 NOTE — Assessment & Plan Note (Signed)
Symptoms and exam are consistent with bronchpneumonia. Start sample of Breo. Start Augmentin secondary to multiple interactions with with Invega. Continue current dosage of Xopenex secondary to noted tachycardia. Unlikely SIRS or sepsis currently. Follow up if symptoms worsen or do not improve.

## 2016-11-09 ENCOUNTER — Ambulatory Visit: Payer: 59 | Admitting: Family

## 2016-11-10 ENCOUNTER — Ambulatory Visit: Payer: 59 | Admitting: Family

## 2016-12-04 ENCOUNTER — Telehealth: Payer: Self-pay | Admitting: Family

## 2016-12-04 DIAGNOSIS — J4520 Mild intermittent asthma, uncomplicated: Secondary | ICD-10-CM

## 2016-12-04 MED ORDER — LEVALBUTEROL TARTRATE 45 MCG/ACT IN AERO: 1.0000 | INHALATION_SPRAY | Freq: Four times a day (QID) | RESPIRATORY_TRACT | 0 refills | Status: DC | PRN

## 2016-12-04 NOTE — Telephone Encounter (Signed)
Pt called asking for a refill on levalbuterol [facility] HFA) 45 MCG/ACT inhaler sent to [facility]. She has an appointment with Dr Jenny Reichmann for an asthma flare up issue but is out of her inhaler.

## 2016-12-04 NOTE — Telephone Encounter (Signed)
Per office policy sent 30 day to local pharmacy until appt.../lmb  

## 2016-12-08 ENCOUNTER — Ambulatory Visit: Payer: 59 | Admitting: Family Medicine

## 2016-12-08 ENCOUNTER — Encounter: Payer: Self-pay | Admitting: Family Medicine

## 2016-12-08 VITALS — BP 122/72 | HR 87 | Temp 98.2°F | Ht 62.0 in | Wt 225.0 lb

## 2016-12-08 DIAGNOSIS — J45901 Unspecified asthma with (acute) exacerbation: Secondary | ICD-10-CM

## 2016-12-08 MED ORDER — PREDNISONE 10 MG PO TABS: 30.0000 mg | ORAL_TABLET | Freq: Every day | ORAL | 0 refills | Status: DC

## 2016-12-08 NOTE — Assessment & Plan Note (Addendum)
Acute exacerbation. Has had 3 exacerbations in the past year. Persistent in nature. Does smoke marijuana but no tobacco - Could initiate Singulair and inhaled controller medication - prednisone today  - Referral to pulmonology

## 2016-12-08 NOTE — Patient Instructions (Addendum)
Thank you for coming in,   Please take the prednisone.   Please let me know if you don't have improvement of your symptoms.   I have made a referral to the lung doctors.   Please continue using the inhaler every 4-6 hours for the next 2 days.     Please feel free to call with any questions or concerns at any time, at [phone]. --Dr. Raeford Razor

## 2016-12-08 NOTE — Progress Notes (Signed)
[name] - 31 y.o. female MRN [id number]  Date of birth: 1985/08/20  SUBJECTIVE:  Including CC & ROS.  Chief Complaint  Patient presents with  . Shortness of Breath    She states she is using her inhaler 3-4 times a day, which is more than she ues daily this has been going on for one week. Also has drainage and sinus pressure.      [name] is a 31 y.o. female that is presenting with shortness of breath and acute asthma exacerbation. Her symptoms started about a week ago. She has been using her inhaler every 4-6 hours with limited improvement. She denies any significant coughing at night. She does have some drainage. He has not been using any over-the-counter medications for a cold.[name]  She was admitted on 9/11 for a shortness of breath and asthma exacerbation. She completed a course of antibiotics.  Review of her CT angina chest from 9/11 shows no embolus and geographic groundglass opacities throughout both lungs which would suggest a likely infectious bronchiolitis.  Review the chest x-ray from 9/11 shows mild bronchitic changes   Review of Systems  Constitutional: Negative for fever.  HENT: Positive for rhinorrhea.   Respiratory: Positive for cough and shortness of breath.   Cardiovascular: Negative for chest pain.  Neurological: Negative for weakness.  Hematological: Negative for adenopathy.    HISTORY: Past Medical, Surgical, Social, and Family History Reviewed & Updated per EMR.   Pertinent Historical Findings include:  Past Medical History:  Diagnosis Date  . Allergy   . Asthma   . Borderline personality disorder [facility])     Past Surgical History:  Procedure Laterality Date  . Thumb surgery Left     Allergies  Allergen Reactions  . Cogentin [Benztropine]     Family History  Problem Relation Age of Onset  . Healthy Mother   . Healthy Father   . Alzheimer's disease Maternal Grandmother   . Skin cancer Paternal Grandmother      Social History    Socioeconomic History  . Marital status: Single    Spouse name: Not on file  . Number of children: 0  . Years of education: 74  . Highest education level: Not on file  Social Needs  . Financial resource strain: Not on file  . Food insecurity - worry: Not on file  . Food insecurity - inability: Not on file  . Transportation needs - medical: Not on file  . Transportation needs - non-medical: Not on file  Occupational History  . Not on file  Tobacco Use  . Smoking status: Former Smoker    Packs/day: 0.50    Years: 9.00    Pack years: 4.50    Last attempt to quit: 12/04/2015    Years since quitting: 1.0  . Smokeless tobacco: Never Used  Substance and Sexual Activity  . Alcohol use: No    Alcohol/week: 0.0 oz  . Drug use: Yes    Frequency: 4.0 times per week    Types: Marijuana  . Sexual activity: Yes    Birth control/protection: Pill  Other Topics Concern  . Not on file  Social History Narrative   Fun: Play video games and watch movies.   Denies abuse and feels safe at home.     PHYSICAL EXAM:  VS: BP 122/72 (BP Location: Left Arm, Patient Position: Sitting, Cuff Size: Normal)   Pulse 87   Temp 98.2 F (36.8 C) (Oral)   Ht 5\' 2"  (1.575 m)  Wt 225 lb (102.1 kg)   SpO2 98%   BMI 41.15 kg/m  Physical Exam Gen: NAD, alert, cooperative with exam, well-appearing ENT: normal lips, normal nasal mucosa,  Eye: normal EOM, normal conjunctiva and lids CV:  no edema, +2 pedal pulses, S1-S2, regular rate and rhythm   Resp: no accessory muscle use, non-labored, significant expiratory wheezing. Skin: no rashes, no areas of induration  Neuro: normal tone, normal sensation to touch Psych:  normal insight, alert and oriented MSK: Normal gait, normal strength      ASSESSMENT & PLAN:   Acute asthma exacerbation Acute exacerbation. Has had 3 exacerbations in the past year. Persistent in nature. Does smoke marijuana but no tobacco - Could initiate Singulair and inhaled  controller medication - prednisone today  - Referral to pulmonology

## 2016-12-15 ENCOUNTER — Ambulatory Visit: Payer: 59 | Admitting: Internal Medicine

## 2016-12-28 ENCOUNTER — Telehealth: Payer: Self-pay | Admitting: Family

## 2016-12-28 ENCOUNTER — Emergency Department (HOSPITAL_COMMUNITY)
Admission: EM | Admit: 2016-12-28 | Discharge: 2016-12-29 | Disposition: A | Discharge status: Home / Self Care | Payer: 59 | Attending: Emergency Medicine | Admitting: Emergency Medicine

## 2016-12-28 ENCOUNTER — Encounter (HOSPITAL_COMMUNITY): Payer: Self-pay | Admitting: Emergency Medicine

## 2016-12-28 ENCOUNTER — Emergency Department (HOSPITAL_COMMUNITY): Payer: 59

## 2016-12-28 DIAGNOSIS — E876 Hypokalemia: Secondary | ICD-10-CM | POA: Diagnosis not present

## 2016-12-28 DIAGNOSIS — Z87891 Personal history of nicotine dependence: Secondary | ICD-10-CM | POA: Diagnosis not present

## 2016-12-28 DIAGNOSIS — Z79899 Other long term (current) drug therapy: Secondary | ICD-10-CM | POA: Insufficient documentation

## 2016-12-28 DIAGNOSIS — J4521 Mild intermittent asthma with (acute) exacerbation: Secondary | ICD-10-CM | POA: Diagnosis not present

## 2016-12-28 DIAGNOSIS — R0602 Shortness of breath: Secondary | ICD-10-CM | POA: Diagnosis present

## 2016-12-28 LAB — BASIC METABOLIC PANEL
Anion gap: 9 (ref 5–15)
BUN: 6 mg/dL (ref 6–20)
CALCIUM: 8.9 mg/dL (ref 8.9–10.3)
CHLORIDE: 102 mmol/L (ref 101–111)
CO2: 24 mmol/L (ref 22–32)
Creatinine, Ser: 0.84 mg/dL (ref 0.44–1.00)
GFR calc Af Amer: >60 mL/min (ref >60)
GFR calc non Af Amer: >60 mL/min (ref >60)
GLUCOSE: 118 mg/dL — AB (ref 65–99)
POTASSIUM: 3 mmol/L — AB (ref 3.5–5.1)
Sodium: 135 mmol/L (ref 135–145)

## 2016-12-28 LAB — CBC
HEMATOCRIT: 41.4 % (ref 36.0–46.0)
HEMOGLOBIN: 14.4 g/dL (ref 12.0–15.0)
MCH: 30.6 pg (ref 26.0–34.0)
MCHC: 34.8 g/dL (ref 30.0–36.0)
MCV: 88.1 fL (ref 78.0–100.0)
Platelets: 305 10*3/uL (ref 150–400)
RBC: 4.7 MIL/uL (ref 3.87–5.11)
RDW: 12.4 % (ref 11.5–15.5)
WBC: 11.6 10*3/uL — ABNORMAL HIGH (ref 4.0–10.5)

## 2016-12-28 LAB — I-STAT TROPONIN, ED: TROPONIN I, POC: 0 ng/mL (ref 0.00–0.08)

## 2016-12-28 LAB — MAGNESIUM: MAGNESIUM: 1.8 mg/dL (ref 1.7–2.4)

## 2016-12-28 LAB — I-STAT BETA HCG BLOOD, ED (MC, WL, AP ONLY): I-stat hCG, quantitative: <5 m[IU]/mL (ref <5)

## 2016-12-28 MED ORDER — ALBUTEROL (5 MG/ML) CONTINUOUS INHALATION SOLN: 10.0000 mg/h | INHALATION_SOLUTION | Freq: Once | RESPIRATORY_TRACT | Status: DC

## 2016-12-28 MED ORDER — LEVALBUTEROL HCL 0.63 MG/3ML IN NEBU
0.6300 mg | INHALATION_SOLUTION | Freq: Once | RESPIRATORY_TRACT | Status: AC
  Administered 2016-12-28: 0.63 mg via RESPIRATORY_TRACT
  Filled 2016-12-28: qty 3

## 2016-12-28 MED ORDER — LEVALBUTEROL HCL 1.25 MG/0.5ML IN NEBU
INHALATION_SOLUTION | RESPIRATORY_TRACT | Status: AC
  Administered 2016-12-28: 2.5 mg via RESPIRATORY_TRACT
  Filled 2016-12-28: qty 1

## 2016-12-28 MED ORDER — LORAZEPAM 2 MG/ML IJ SOLN
1.0000 mg | Freq: Once | INTRAMUSCULAR | Status: AC
  Administered 2016-12-28: 1 mg via INTRAVENOUS
  Filled 2016-12-28: qty 1

## 2016-12-28 MED ORDER — ALBUTEROL (5 MG/ML) CONTINUOUS INHALATION SOLN
10.0000 mg/h | INHALATION_SOLUTION | Freq: Once | RESPIRATORY_TRACT | Status: DC
  Filled 2016-12-28: qty 20

## 2016-12-28 MED ORDER — PREDNISONE 20 MG PO TABS
60.0000 mg | ORAL_TABLET | Freq: Once | ORAL | Status: AC
  Administered 2016-12-28: 60 mg via ORAL
  Filled 2016-12-28: qty 3

## 2016-12-28 MED ORDER — DEXAMETHASONE SODIUM PHOSPHATE 10 MG/ML IJ SOLN
10.0000 mg | Freq: Once | INTRAMUSCULAR | Status: AC
  Administered 2016-12-28: 10 mg via INTRAVENOUS
  Filled 2016-12-28: qty 1

## 2016-12-28 MED ORDER — ALBUTEROL SULFATE (5 MG/ML) 0.5% IN NEBU: 2.5000 mg | INHALATION_SOLUTION | Freq: Once | RESPIRATORY_TRACT | Status: DC

## 2016-12-28 MED ORDER — POTASSIUM CHLORIDE CRYS ER 20 MEQ PO TBCR
40.0000 meq | EXTENDED_RELEASE_TABLET | Freq: Once | ORAL | Status: AC
  Administered 2016-12-28: 40 meq via ORAL
  Filled 2016-12-28: qty 2

## 2016-12-28 MED ORDER — LEVALBUTEROL HCL 1.25 MG/0.5ML IN NEBU
2.5000 mg | INHALATION_SOLUTION | Freq: Once | RESPIRATORY_TRACT | Status: AC
  Administered 2016-12-28: 2.5 mg via RESPIRATORY_TRACT

## 2016-12-28 MED ORDER — IPRATROPIUM BROMIDE 0.02 % IN SOLN
0.5000 mg | Freq: Once | RESPIRATORY_TRACT | Status: AC
  Administered 2016-12-28: 0.5 mg via RESPIRATORY_TRACT
  Filled 2016-12-28: qty 2.5

## 2016-12-28 MED ORDER — POTASSIUM CHLORIDE 10 MEQ/100ML IV SOLN
10.0000 meq | INTRAVENOUS | Status: AC
  Administered 2016-12-28 (×2): 10 meq via INTRAVENOUS
  Filled 2016-12-28: qty 100

## 2016-12-28 MED ORDER — HYDROXYZINE HCL 25 MG PO TABS: 25.0000 mg | ORAL_TABLET | Freq: Once | ORAL | Status: DC

## 2016-12-28 NOTE — ED Triage Notes (Addendum)
Patient c/o SOB x3 days and constant sharp left sided chest pain since this morning. Hx asthma. Denies N/V/D. Reports using inhaler and breathing treatments at home PTA with no relief.

## 2016-12-28 NOTE — ED Provider Notes (Signed)
[facility] DEPT Provider Note   CSN: [id number] Arrival date & time: 12/28/16  1438     History   Chief Complaint Chief Complaint  Patient presents with  . Shortness of Breath    HPI [name] is a 31 y.o. female.  HPI   31 year old female with history of borderline personality disorder, asthma, allergies presenting for evaluation of shortness of breath.  Patient report for the past 3-4 days she has had asthma exacerbation.  She endorses sinus congestion, having had lightheadedness, throat irritation, nonproductive cough, pain in the left chest as well as having shortness of breath and wheezing.  She is using her inhaler throughout the night and during the day without adequate relief.  She also has been using her nebulizer as well if no improvement.  Endorsed some chills.  Shortness of breath brought on by exertion.  Wheezing has been persistent.  She is currently on birth control pill, denies any recent surgery, prolonged bed rest, unilateral swelling or calf pain no active cancer no recent travel or no recent surgery.  No prior history of PE or DVT.  She is an occasional smoker and marijuana user.  Past Medical History:  Diagnosis Date  . Allergy   . Asthma   . Borderline personality disorder [facility])     Patient Active Problem List   Diagnosis Date Noted  . Anxiety 10/26/2016  . Bronchopneumonia 10/13/2016  . Acute asthma exacerbation 10/13/2016  . Hypokalemia 10/13/2016  . Encounter for general adult medical examination with abnormal findings 04/17/2016  . Class 2 obesity due to excess calories without serious comorbidity with body mass index (BMI) of 37.0 to 37.9 in adult 04/17/2016  . Asthma 03/13/2016  . Borderline personality disorder ([place]) 03/13/2016  . Eczema 03/13/2016  . Cough 01/28/2016  . Antibiotic-associated diarrhea 01/28/2016  . Mood disorder ([place]) 03/04/2013    Past Surgical History:  Procedure Laterality Date  .  Thumb surgery Left     OB History    No data available       Home Medications    Prior to Admission medications   Medication Sig Start Date End Date Taking? Authorizing Provider  FLUoxetine (PROZAC) 40 MG capsule Take 1 capsule (40 mg total) by mouth daily. 10/15/16   Regalado, Belkys A, MD  gabapentin (NEURONTIN) 300 MG capsule Take 1 capsule (300 mg total) by mouth 3 (three) times daily. 05/01/14   Wendie Agreste, MD  hydrOXYzine (ATARAX/VISTARIL) 50 MG tablet Take 50 mg once by mouth.     [provider]  ipratropium (ATROVENT) 0.02 % nebulizer solution Take 2.5 mLs (0.5 mg total) by nebulization every 6 (six) hours. 10/15/16   Regalado, Belkys A, MD  levalbuterol (XOPENEX HFA) 45 MCG/ACT inhaler Inhale 1-2 puffs into the lungs every 6 (six) hours as needed for wheezing. Must keep appt w/new provider for future refills 12/04/16   Biagio Borg, MD  levalbuterol Penne Lash) 0.63 MG/3ML nebulizer solution Take 3 mLs (0.63 mg total) by nebulization every 8 (eight) hours as needed for wheezing or shortness of breath. 10/26/16   Golden Circle, FNP  LORazepam (ATIVAN) 0.5 MG tablet Take 1 tablet (0.5 mg total) by mouth 2 (two) times daily as needed for anxiety. 10/26/16   Golden Circle, FNP  norethindrone-ethinyl estradiol (OVCON-50) 1-50 MG-MCG tablet Take 1 tablet by mouth daily.    [provider]  paliperidone (INVEGA) 6 MG 24 hr tablet Take 1 tablet (6 mg total) by  mouth daily. 05/01/14   Wendie Agreste, MD  predniSONE (DELTASONE) 10 MG tablet Take 3 tablets (30 mg total) daily with breakfast by mouth. For 5 days 12/08/16   Rosemarie Ax, MD    Family History Family History  Problem Relation Age of Onset  . Healthy Mother   . Healthy Father   . Alzheimer's disease Maternal Grandmother   . Skin cancer Paternal Grandmother     Social History Social History   Tobacco Use  . Smoking status: Former Smoker    Packs/day: 0.50    Years: 9.00    Pack years:  4.50    Last attempt to quit: 12/04/2015    Years since quitting: 1.0  . Smokeless tobacco: Never Used  Substance Use Topics  . Alcohol use: No    Alcohol/week: 0.0 oz  . Drug use: Yes    Frequency: 4.0 times per week    Types: Marijuana     Allergies   Cogentin [benztropine]   Review of Systems Review of Systems  All other systems reviewed and are negative.    Physical Exam Updated Vital Signs BP (!) 148/89 (BP Location: Right Arm)   Pulse (!) 122   Temp 97.9 F (36.6 C) (Oral)   Resp 20   Ht 5\' 2"  (1.575 m)   Wt 97.5 kg (215 lb)   LMP 12/25/2016   SpO2 98%   BMI 39.32 kg/m   Physical Exam  Constitutional: She appears well-developed and well-nourished. No distress.  HENT:  Head: Atraumatic.  Ears: Normal TMs bilaterally Nose: Normal nares Throat: Uvula is midline no tonsillar enlargement or exudates, no trismus  Eyes: Conjunctivae are normal.  Neck: Neck supple. No JVD present.  Pulmonary/Chest:  Tachycardia and mildly tachypneic.  Inspiratory and expiratory wheezes heard throughout with diminished sounds at the lung bases.  Abdominal: Soft. She exhibits no distension. There is no tenderness. There is no guarding.  Musculoskeletal:       Right lower leg: Normal.       Left lower leg: Normal.  Neurological: She is alert.  Skin: Capillary refill takes less than 2 seconds. No rash noted.  Psychiatric: She has a normal mood and affect.  Nursing note and vitals reviewed.    ED Treatments / Results  Labs (all labs ordered are listed, but only abnormal results are displayed) Labs Reviewed  BASIC METABOLIC PANEL - Abnormal; Notable for the following components:      Result Value   Potassium 3.0 (*)    Glucose, Bld 118 (*)    All other components within normal limits  CBC - Abnormal; Notable for the following components:   WBC 11.6 (*)    All other components within normal limits  MAGNESIUM  I-STAT TROPONIN, ED  I-STAT BETA HCG BLOOD, ED ([facility], [facility], [facility]  ONLY)    EKG  EKG Interpretation None     ED ECG REPORT   Date: 12/28/2016  Rate: 111  Rhythm: sinus tachycardia  QRS Axis: normal  Intervals: normal  ST/T Wave abnormalities: normal  Conduction Disutrbances:none  Narrative Interpretation:   Old EKG Reviewed: unchanged  I have personally reviewed the EKG tracing and agree with the computerized printout as noted.  Radiology Dg Chest 2 View  Result Date: 12/28/2016 CLINICAL DATA:  Left-sided chest pain and shortness breath four days. Asthma. EXAM: CHEST  2 VIEW COMPARISON:  10/13/2016 FINDINGS: The heart size and mediastinal contours are within normal limits. Both lungs are clear. No evidence of pneumothorax  or pleural effusion. The visualized skeletal structures are unremarkable. IMPRESSION: Stable.  No active cardiopulmonary disease. Electronically Signed   By: Earle Gell M.D.   On: 12/28/2016 15:49    Procedures Procedures (including critical care time)  Medications Ordered in ED Medications  hydrOXYzine (ATARAX/VISTARIL) tablet 25 mg (25 mg Oral Refused 12/28/16 1937)  ipratropium (ATROVENT) nebulizer solution 0.5 mg (0.5 mg Nebulization Given 12/28/16 1939)  predniSONE (DELTASONE) tablet 60 mg (60 mg Oral Given 12/28/16 1909)  potassium chloride SA (K-DUR,KLOR-CON) CR tablet 40 mEq (40 mEq Oral Given 12/28/16 1909)  potassium chloride 10 mEq in 100 mL IVPB (0 mEq Intravenous Stopped 12/28/16 2143)  levalbuterol (XOPENEX) nebulizer solution 2.5 mg (2.5 mg Nebulization Given 12/28/16 1939)  LORazepam (ATIVAN) injection 1 mg (1 mg Intravenous Given 12/28/16 2016)  dexamethasone (DECADRON) injection 10 mg (10 mg Intravenous Given 12/28/16 2143)  potassium chloride SA (K-DUR,KLOR-CON) CR tablet 40 mEq (40 mEq Oral Given 12/28/16 2227)  levalbuterol (XOPENEX) nebulizer solution 0.63 mg (0.63 mg Nebulization Given 12/28/16 2233)     Initial Impression / Assessment and Plan / ED Course  I have reviewed the triage vital  signs and the nursing notes.  Pertinent labs & imaging results that were available during my care of the patient were reviewed by me and considered in my medical decision making (see chart for details).     BP 129/86   Pulse (!) 101   Temp 97.9 F (36.6 C) (Oral)   Resp (!) 24   Ht 5\' 2"  (1.575 m)   Wt 97.5 kg (215 lb)   LMP 12/25/2016   SpO2 97%   BMI 39.32 kg/m    Final Clinical Impressions(s) / ED Diagnoses   Final diagnoses:  Mild intermittent asthma with exacerbation  Hypokalemia    ED Discharge Orders        Ordered    predniSONE (DELTASONE) 20 MG tablet     12/29/16 0002    benzonatate (TESSALON) 100 MG capsule  Every 8 hours     12/29/16 0002    potassium chloride SA (K-DUR,KLOR-CON) 20 MEQ tablet  Daily     12/29/16 0002     7:02 PM Patient here with cold symptoms as well as persistent wheezing.  Her symptom is consistent with an asthma exacerbation.  Her lab is remarkable for hypokalemia with a potassium of 3.0.  EKG without concerning feature.  Chest x-ray shows no focal infiltrate concerning for pneumonia.  She is not anemic.  Plan to provide symptomatic treatment for her asthma exacerbation with a continuous nebulizer, and steroid.  7:22 PM Patient took her potassium however she vomited up.  We will give potassium via IV.  10:06 PM Patient felt better after receiving breathing treatment.  She ambulate more maintain adequate oxygenation.  She had received potassium via IV, will attempt to give p.o. potassium again.  Her magnesium level was normal.  12:03 AM Pt tolerates her potassium PO.  Her breathing improves.  Dr. Ellender Hose have also seen and evaluated pt.  Pt stable for discharge.  Return precaution given.     Domenic Moras, PA-C 12/29/16 Lynnell Catalan    Duffy Bruce, MD 12/29/16 [phone]

## 2016-12-28 NOTE — Progress Notes (Signed)
RT note: Pts. 1 hr. long CAT, (continuous aerosol tx.) nebulizer given with 2.5 mg,(1.25mg  unit dose X2) Xopenex substituted for 10 mg Albuterol per pts. home regimen, Domenic Moras, PA-C made aware.

## 2016-12-28 NOTE — Telephone Encounter (Signed)
Patient Name: [name] DOB: May 18, 1985 Initial Comment Caller has been having shortness of breath and stabbing pains. Nurse Assessment Nurse: Ronnald Ramp, RN, Miranda Date/Time (Eastern Time): 12/28/2016 2:01:24 PM Confirm and document reason for call. If symptomatic, describe symptoms. ---Caller states she has been having sharp stabbing chest pain for 30 min. She has been having SOB and asthma flair up over the weekend. Albuterol Q4hrs (last dose 2-3 hrs ago). Does the patient have any new or worsening symptoms? ---Yes Will a triage be completed? ---Yes Related visit to physician within the last 2 weeks? ---No Does the PT have any chronic conditions? (i.e. diabetes, asthma, etc.) ---Yes List chronic conditions. ---Asthma, Mental health, Allergies Is the patient pregnant or possibly pregnant? (Ask all females between the ages of 44-55) ---No Is this a behavioral health or substance abuse call? ---No Guidelines Guideline Title Affirmed Question Affirmed Notes Asthma Attack Chest pain Final Disposition User Go to ED Now Ronnald Ramp, RN, Miranda Referrals [facility] - ED Caller Disagree/Comply Comply Caller Understands Yes PreDisposition Call Doctor

## 2016-12-29 MED ORDER — POTASSIUM CHLORIDE CRYS ER 20 MEQ PO TBCR: 20.0000 meq | EXTENDED_RELEASE_TABLET | Freq: Every day | ORAL | 0 refills | Status: DC

## 2016-12-29 MED ORDER — PREDNISONE 20 MG PO TABS: ORAL_TABLET | ORAL | 0 refills | Status: DC

## 2016-12-29 MED ORDER — BENZONATATE 100 MG PO CAPS: 100.0000 mg | ORAL_CAPSULE | Freq: Three times a day (TID) | ORAL | 0 refills | Status: DC

## 2016-12-29 NOTE — Discharge Instructions (Signed)
You have been evaluated for your shortness of breath due to an asthma exacerbation.  Continue using your inhaler at home.  Take steroid and cough medication as prescribed.  Your potassium level is low today, take supplementation as prescribed.  Return if your condition worsen or if you have other concerns.

## 2017-01-01 ENCOUNTER — Encounter: Payer: Self-pay | Admitting: Internal Medicine

## 2017-01-01 ENCOUNTER — Ambulatory Visit: Payer: 59 | Admitting: Internal Medicine

## 2017-01-01 ENCOUNTER — Other Ambulatory Visit (INDEPENDENT_AMBULATORY_CARE_PROVIDER_SITE_OTHER): Payer: 59

## 2017-01-01 VITALS — BP 120/64 | HR 93 | Temp 97.8°F | Ht 62.0 in | Wt 222.8 lb

## 2017-01-01 DIAGNOSIS — Z0001 Encounter for general adult medical examination with abnormal findings: Secondary | ICD-10-CM

## 2017-01-01 DIAGNOSIS — R739 Hyperglycemia, unspecified: Secondary | ICD-10-CM

## 2017-01-01 DIAGNOSIS — T7840XA Allergy, unspecified, initial encounter: Secondary | ICD-10-CM | POA: Insufficient documentation

## 2017-01-01 DIAGNOSIS — J45901 Unspecified asthma with (acute) exacerbation: Secondary | ICD-10-CM | POA: Diagnosis not present

## 2017-01-01 DIAGNOSIS — Z87891 Personal history of nicotine dependence: Secondary | ICD-10-CM | POA: Insufficient documentation

## 2017-01-01 DIAGNOSIS — E876 Hypokalemia: Secondary | ICD-10-CM

## 2017-01-01 DIAGNOSIS — T7840XS Allergy, unspecified, sequela: Secondary | ICD-10-CM | POA: Diagnosis not present

## 2017-01-01 DIAGNOSIS — J4520 Mild intermittent asthma, uncomplicated: Secondary | ICD-10-CM

## 2017-01-01 LAB — BASIC METABOLIC PANEL
BUN: 9 mg/dL (ref 6–23)
CALCIUM: 9.2 mg/dL (ref 8.4–10.5)
CO2: 24 mEq/L (ref 19–32)
CREATININE: 0.76 mg/dL (ref 0.40–1.20)
Chloride: 104 mEq/L (ref 96–112)
GFR: 95.54 mL/min (ref >60.00)
GLUCOSE: 99 mg/dL (ref 70–99)
Potassium: 4 mEq/L (ref 3.5–5.1)
Sodium: 137 mEq/L (ref 135–145)

## 2017-01-01 LAB — HEMOGLOBIN A1C: Hgb A1c MFr Bld: 5.4 % (ref 4.6–6.5)

## 2017-01-01 MED ORDER — LEVALBUTEROL TARTRATE 45 MCG/ACT IN AERO: 1.0000 | INHALATION_SPRAY | Freq: Four times a day (QID) | RESPIRATORY_TRACT | 11 refills | Status: DC | PRN

## 2017-01-01 MED ORDER — BUDESONIDE-FORMOTEROL FUMARATE 160-4.5 MCG/ACT IN AERO: 2.0000 | INHALATION_SPRAY | Freq: Two times a day (BID) | RESPIRATORY_TRACT | 3 refills | Status: DC

## 2017-01-01 MED ORDER — METHYLPREDNISOLONE ACETATE 80 MG/ML IJ SUSP
80.0000 mg | Freq: Once | INTRAMUSCULAR | Status: AC
  Administered 2017-01-01: 80 mg via INTRAMUSCULAR

## 2017-01-01 NOTE — Patient Instructions (Signed)
You had the steroid shot today  Please take all new medication as prescribed - the Symbicort on a regular basis  Please continue all other medications as before, and refills have been done if requested.  Please have the pharmacy call with any other refills you may need.  Please continue your efforts at being more active, low cholesterol diet, and weight control.  You are otherwise up to date with prevention measures today.  Please keep your appointments with your specialists as you may have planned  Please go to the LAB in the Basement (turn left off the elevator) for the tests to be done today  You will be contacted by phone if any changes need to be made immediately.  Otherwise, you will receive a letter about your results with an explanation, but please check with MyChart first.  Please remember to sign up for MyChart if you have not done so, as this will be important to you in the future with finding out test results, communicating by private email, and scheduling acute appointments online when needed.  Please return in 1 year for your yearly visit, or sooner if needed, with Lab testing done 3-5 days before

## 2017-01-01 NOTE — Progress Notes (Signed)
Subjective:    [name], female    DOB: [birth year], 31 y.o.   MRN: [id number]  HPI  Here for wellness and f/u;  Overall doing ok;  Pt denies Chest pain, worsening SOB, DOE, wheezing, orthopnea, PND, worsening LE edema, palpitations, dizziness or syncope.  Pt denies neurological change such as new headache, facial or extremity weakness.  Pt denies polydipsia, polyuria, or low sugar symptoms. Pt states overall good compliance with treatment and medications, good tolerability, and has been trying to follow appropriate diet.  Pt denies worsening depressive symptoms, suicidal ideation or panic. No fever, night sweats, wt loss, loss of appetite, or other constitutional symptoms.  Pt states good ability with ADL's, has low fall risk, home safety reviewed and adequate, no other significant changes in hearing or vision, and only occasionally active with exercise.  Was seen and tx with IV steroid, potassium and Nebs and IVF's in ED nov 26, improved and d/c on oral prednisone, and cough med. States about 75% better, with less cough, wheezing, sob, but still mild persists.  Still has several prednisone to take.   Has other ongoing stressors - Dog died last wk and grieving on top of prob chronic mild depression per pt.  Seeing counseling, next appt in dec, declines other change in tx. Plans to make appt for pap after dec 1.  No other new complaints or interval hx Past Medical History:  Diagnosis Date  . Allergy   . Asthma   . Borderline personality disorder ([place])   . Former smoker    Past Surgical History:  Procedure Laterality Date  . Thumb surgery Left     reports that she quit smoking about 12 months ago. She has a 4.50 pack-year smoking history. she has never used smokeless tobacco. She reports that she drinks about 4.8 oz of alcohol per week. She reports that she uses drugs. Drug: Marijuana. Frequency: 4.00 times per week. family history includes Alzheimer's disease in her maternal  grandmother; Healthy in her father and mother; Skin cancer in her paternal grandmother. Allergies  Allergen Reactions  . Cogentin [Benztropine]    Current Outpatient Medications on File Prior to Visit  Medication Sig Dispense Refill  . benzonatate (TESSALON) 100 MG capsule Take 1 capsule (100 mg total) by mouth every 8 (eight) hours. 21 capsule 0  . FLUoxetine (PROZAC) 40 MG capsule Take 1 capsule (40 mg total) by mouth daily. 30 capsule 0  . gabapentin (NEURONTIN) 300 MG capsule Take 1 capsule (300 mg total) by mouth 3 (three) times daily. 90 capsule 2  . hydrOXYzine (ATARAX/VISTARIL) 50 MG tablet Take 50 mg by mouth 2 (two) times daily.     [name] levalbuterol (XOPENEX) 0.63 MG/3ML nebulizer solution Take 3 mLs (0.63 mg total) by nebulization every 8 (eight) hours as needed for wheezing or shortness of breath. 90 mL 1  . LORazepam (ATIVAN) 0.5 MG tablet Take 1 tablet (0.5 mg total) by mouth 2 (two) times daily as needed for anxiety. 10 tablet 0  . norethindrone-ethinyl estradiol (OVCON-50) 1-50 MG-MCG tablet Take 1 tablet by mouth daily.    . paliperidone (INVEGA) 6 MG 24 hr tablet Take 1 tablet (6 mg total) by mouth daily. 30 tablet 2  . potassium chloride SA (K-DUR,KLOR-CON) 20 MEQ tablet Take 1 tablet (20 mEq total) by mouth daily. 3 tablet 0  . predniSONE (DELTASONE) 20 MG tablet 3 tabs po day one, then 2 tabs daily x 4 days 11 tablet 0  No current facility-administered medications on file prior to visit.    Review of Systems Constitutional: Negative for other unusual diaphoresis, sweats, appetite or weight changes HENT: Negative for other worsening hearing loss, ear pain, facial swelling, mouth sores or neck stiffness.   Eyes: Negative for other worsening pain, redness or other visual disturbance.  Respiratory: Negative for other stridor or swelling Cardiovascular: Negative for other palpitations or other chest pain  Gastrointestinal: Negative for worsening diarrhea or loose stools,  blood in stool, distention or other pain Genitourinary: Negative for hematuria, flank pain or other change in urine volume.  Musculoskeletal: Negative for myalgias or other joint swelling.  Skin: Negative for other color change, or other wound or worsening drainage.  Neurological: Negative for other syncope or numbness. Hematological: Negative for other adenopathy or swelling Psychiatric/Behavioral: Negative for hallucinations, other worsening agitation, SI, self-injury, or new decreased concentration All other system neg per pt    Objective:   Physical Exam BP 120/64 (BP Location: Left Arm, Patient Position: Sitting, Cuff Size: Normal)   Pulse 93   Temp 97.8 F (36.6 C) (Oral)   Ht 5\' 2"  (1.575 m)   Wt 222 lb 12.8 oz (101.1 kg)   LMP 12/25/2016 (Approximate)   SpO2 96%   BMI 40.75 kg/m  VS noted, non toxic Constitutional: Pt is oriented to person, place, and time. Appears well-developed and well-nourished, in no significant distress and comfortable Head: Normocephalic and atraumatic  Eyes: Conjunctivae and EOM are normal. Pupils are equal, round, and reactive to light Right Ear: External ear normal without discharge Left Ear: External ear normal without discharge Nose: Nose without discharge or deformity Mouth/Throat: Oropharynx is without other ulcerations and moist  Neck: Normal range of motion. Neck supple. No JVD present. No tracheal deviation present or significant neck LA or mass Cardiovascular: Normal rate, regular rhythm, normal heart sounds and intact distal pulses.   Pulmonary/Chest: WOB normal and breath sounds decreased without rales but with bilat mild wheezing  Abdominal: Soft. Bowel sounds are normal. NT. No HSM  Musculoskeletal: Normal range of motion. Exhibits no edema Lymphadenopathy: Has no other cervical adenopathy.  Neurological: Pt is alert and oriented to person, place, and time. Pt has normal reflexes. No cranial nerve deficit. Motor grossly intact, Gait  intact Skin: Skin is warm and dry. No rash noted or new ulcerations Psychiatric:  Has depressed mood and affect. Behavior is normal without agitation No other exam findings    Assessment & Plan:

## 2017-01-02 NOTE — Assessment & Plan Note (Signed)
Mild in ED, asympt, for f/u lab

## 2017-01-02 NOTE — Assessment & Plan Note (Signed)
Mild persistent, for depomedrol 80, start symbicort controller med indefinitely, finish prednisone, and cont all other inhaler prn  In addition to the time spent performing CPE, I spent an additional 25 minutes face to face,in which greater than 50% of this time was spent in counseling and coordination of care for patient's acute illness as documented, including the differential dx, tx, further evaluation and other management of asthma with acute exacerbation, hyperglycemia, hypokalemia, and allergy

## 2017-01-02 NOTE — Assessment & Plan Note (Signed)
Should also improve with steroid tx, and restart otc antihistamine and nasal steroid

## 2017-01-02 NOTE — Assessment & Plan Note (Signed)
Mild, asympt, for f/u lab today

## 2017-01-02 NOTE — Assessment & Plan Note (Signed)

## 2017-01-14 ENCOUNTER — Ambulatory Visit (INDEPENDENT_AMBULATORY_CARE_PROVIDER_SITE_OTHER): Payer: 59 | Admitting: Pulmonary Disease

## 2017-01-14 ENCOUNTER — Encounter: Payer: Self-pay | Admitting: Pulmonary Disease

## 2017-01-14 ENCOUNTER — Other Ambulatory Visit (INDEPENDENT_AMBULATORY_CARE_PROVIDER_SITE_OTHER): Payer: 59

## 2017-01-14 DIAGNOSIS — J45909 Unspecified asthma, uncomplicated: Secondary | ICD-10-CM

## 2017-01-14 LAB — CBC WITH DIFFERENTIAL/PLATELET
BASOS ABS: 0.1 10*3/uL (ref 0.0–0.1)
Basophils Relative: 1.2 % (ref 0.0–3.0)
EOS ABS: 0.3 10*3/uL (ref 0.0–0.7)
Eosinophils Relative: 2.7 % (ref 0.0–5.0)
HCT: 42.2 % (ref 36.0–46.0)
HEMOGLOBIN: 14.1 g/dL (ref 12.0–15.0)
LYMPHS PCT: 23.5 % (ref 12.0–46.0)
Lymphs Abs: 2.4 10*3/uL (ref 0.7–4.0)
MCHC: 33.3 g/dL (ref 30.0–36.0)
MCV: 91 fl (ref 78.0–100.0)
MONO ABS: 0.6 10*3/uL (ref 0.1–1.0)
Monocytes Relative: 5.9 % (ref 3.0–12.0)
Neutro Abs: 6.8 10*3/uL (ref 1.4–7.7)
Neutrophils Relative %: 66.7 % (ref 43.0–77.0)
Platelets: 309 10*3/uL (ref 150.0–400.0)
RBC: 4.64 Mil/uL (ref 3.87–5.11)
RDW: 12.7 % (ref 11.5–15.5)
WBC: 10.2 10*3/uL (ref 4.0–10.5)

## 2017-01-14 LAB — NITRIC OXIDE: NITRIC OXIDE: 14

## 2017-01-14 NOTE — Progress Notes (Signed)
[name]    [id number]    [birth year]  Primary Care Physician:John, Hunt Oris, MD  Referring Physician: Rosemarie Ax, MD [address] [place], [place] [address]  Chief complaint: Consult for asthma  HPI: 31 year old with history of asthma, allergies, borderline personality disorder.  She was diagnosed with asthma in 2006 and has been intermittently on inhalers.  She has frequent exacerbations with pneumonias.  She had an admission in September 2018 for acute asthma exacerbation with bronchopneumonia treated with ceftriaxone, doxy, steroids and nebs.  She had an ED visit in November 2018 for asthma exacerbation.  Seen by Dr. Jenny Reichmann, her primary care doctor in early December and she was given a Depo shot, prednisone and prescribed Symbicort but she has not picked up the new inhaler yet.  She is on albuterol inhaler which she uses several times a day She has daily symptoms of dyspnea with activity and rest with nonproductive cough, wheezing.  There are no nocturnal awakening.   Pets: 4 cats, 2 dogs.  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  She is in the process of changing house Smoking history: 5-pack-year tobacco history.  Quit in 2017.  She smokes marijuana daily. Travel History: Not significant  Outpatient Encounter Medications as of 01/14/2017  Medication Sig  . benzonatate (TESSALON) 100 MG capsule Take 1 capsule (100 mg total) by mouth every 8 (eight) hours.  [name] FLUoxetine (PROZAC) 40 MG capsule Take 1 capsule (40 mg total) by mouth daily.  [name] gabapentin (NEURONTIN) 300 MG capsule Take 1 capsule (300 mg total) by mouth 3 (three) times daily.  . hydrOXYzine (ATARAX/VISTARIL) 50 MG tablet Take 50 mg by mouth 2 (two) times daily.   [name] levalbuterol (XOPENEX HFA) 45 MCG/ACT inhaler Inhale 1-2 puffs into the lungs every 6 (six) hours as needed for wheezing. Must keep appt w/new provider for future refills   . levalbuterol (XOPENEX) 0.63 MG/3ML nebulizer solution Take 3 mLs (0.63 mg total) by nebulization every 8 (eight) hours as needed for wheezing or shortness of breath.  [name] LORazepam (ATIVAN) 0.5 MG tablet Take 1 tablet (0.5 mg total) by mouth 2 (two) times daily as needed for anxiety.  . norethindrone-ethinyl estradiol (OVCON-50) 1-50 MG-MCG tablet Take 1 tablet by mouth daily.  . paliperidone (INVEGA) 6 MG 24 hr tablet Take 1 tablet (6 mg total) by mouth daily.  . potassium chloride SA (K-DUR,KLOR-CON) 20 MEQ tablet Take 1 tablet (20 mEq total) by mouth daily.  . budesonide-formoterol (SYMBICORT) 160-4.5 MCG/ACT inhaler Inhale 2 puffs into the lungs 2 (two) times daily. (Patient not taking: Reported on 01/14/2017)  . [DISCONTINUED] predniSONE (DELTASONE) 20 MG tablet 3 tabs po day one, then 2 tabs daily x 4 days   No facility-administered encounter medications on file as of 01/14/2017.     Allergies as of 01/14/2017 - Review Complete 01/14/2017  Allergen Reaction Noted  . Cogentin [benztropine]  08/12/2013    Past Medical History:  Diagnosis Date  . Allergy   . Asthma   . Borderline personality disorder ([place])   . Former smoker     Past Surgical History:  Procedure Laterality Date  . Thumb surgery Left     Family History  Problem Relation Age of Onset  . Healthy Mother   . Healthy Father   . Alzheimer's disease Maternal Grandmother   . Skin cancer Paternal Grandmother  Social History   Socioeconomic History  . Marital status: Single    Spouse name: Not on file  . Number of children: 0  . Years of education: 80  . Highest education level: Not on file  Social Needs  . Financial resource strain: Not on file  . Food insecurity - worry: Not on file  . Food insecurity - inability: Not on file  . Transportation needs - medical: Not on file  . Transportation needs - non-medical: Not on file  Occupational History  . Not on file  Tobacco Use  . Smoking status: Former  Smoker    Packs/day: 0.50    Years: 9.00    Pack years: 4.50    Last attempt to quit: 12/04/2015    Years since quitting: 1.1  . Smokeless tobacco: Never Used  Substance and Sexual Activity  . Alcohol use: Yes    Alcohol/week: 4.8 oz    Types: 8 Cans of beer per week    Comment: occ  . Drug use: Yes    Frequency: 4.0 times per week    Types: Marijuana  . Sexual activity: Yes    Birth control/protection: Pill  Other Topics Concern  . Not on file  Social History Narrative   Fun: Play video games and watch movies.   Denies abuse and feels safe at home.    Review of systems: Review of Systems  Constitutional: Negative for fever and chills.  HENT: Negative.   Eyes: Negative for blurred vision.  Respiratory: as per HPI  Cardiovascular: Negative for chest pain and palpitations.  Gastrointestinal: Negative for vomiting, diarrhea, blood per rectum. Genitourinary: Negative for dysuria, urgency, frequency and hematuria.  Musculoskeletal: Negative for myalgias, back pain and joint pain.  Skin: Negative for itching and rash.  Neurological: Negative for dizziness, tremors, focal weakness, seizures and loss of consciousness.  Endo/Heme/Allergies: Negative for environmental allergies.  Psychiatric/Behavioral: Negative for depression, suicidal ideas and hallucinations.  All other systems reviewed and are negative.  Physical Exam: Blood pressure 124/74, pulse 99, height 5\' 2"  (1.575 m), weight 224 lb (101.6 kg), last menstrual period 12/25/2016, SpO2 97 %. Gen:      No acute distress HEENT:  EOMI, sclera anicteric Neck:     No masses; no thyromegaly Lungs:    Scattered expiratory wheeze; normal respiratory effort CV:         Regular rate and rhythm; no murmurs Abd:      + bowel sounds; soft, non-tender; no palpable masses, no distension Ext:    No edema; adequate peripheral perfusion Skin:      Warm and dry; no rash Neuro: alert and oriented x 3 Psych: normal mood and affect  Data  Reviewed: FENO 01/14/17-14  CT chest 10/13/16-no pulmonary embolism, patchy bilateral groundglass opacity Chest x-ray 12/28/16- no acute cardiopulmonary abnormality I reviewed the images personally  Assessment:  Mild persistent asthma FENO is low in office today but she just got off prednisone about 1 week ago.  Examination is significant for scattered expiratory wheeze I have asked her to start Symbicort and continue albuterol as needed We will check pulmonary function test, CBC with differential and blood allergy profile She is on over-the-counter antihistamine and nasal steroid for allergies.  Plan/Recommendations: - Start symbicort, continue albuterol - CBC with differential, blood allergy profile, PFTs  Marshell Garfinkel MD [facility] Pager [phone] 01/14/2017, 9:36 AM  CC: Rosemarie Ax, MD

## 2017-01-14 NOTE — Patient Instructions (Signed)
Please start using the Symbicort that has been ordered by primary care Use albuterol as needed We will get blood work today including CBC with differential and blood allergy profile We will schedule you for pulmonary function test Follow-up in 3 months.

## 2017-01-15 LAB — RESPIRATORY ALLERGY PROFILE REGION II ~~LOC~~
ALLERGEN, CEDAR TREE, T6: 2.83 kU/L — AB
ALLERGEN, MOUSE U PROTEIN, E72: 1.77 kU/L — AB
ALLERGEN, MULBERRY, T70: 0.14 kU/L — AB
ALLERGEN, OAK, T7: 1.19 kU/L — AB
Allergen, A. alternata, m6: <0.1 kU/L
Allergen, Comm Silver Birch, t9: 0.49 kU/L — ABNORMAL HIGH
Allergen, Cottonwood, t14: 0.25 kU/L — ABNORMAL HIGH
Allergen, D pternoyssinus,d7: 1.46 kU/L — ABNORMAL HIGH
Allergen, P. notatum, m1: 0.11 kU/L — ABNORMAL HIGH
Bermuda Grass: 0.17 kU/L — ABNORMAL HIGH
Box Elder IgE: 0.29 kU/L — ABNORMAL HIGH
CAT DANDER: 10.2 kU/L — AB
CLADOSPORIUM HERBARUM (M2) IGE: 0.12 kU/L — ABNORMAL HIGH
CLASS: 0
CLASS: 0
CLASS: 0
CLASS: 0
CLASS: 0
CLASS: 0
CLASS: 0
CLASS: 2
CLASS: 2
CLASS: 2
CLASS: 3
COCKROACH: 2.65 kU/L — AB
COMMON RAGWEED (SHORT) (W1) IGE: 0.18 kU/L — ABNORMAL HIGH
Class: 0
Class: 0
Class: 0
Class: 0
Class: 0
Class: 1
Class: 2
Class: 2
Class: 2
Class: 2
Class: 2
Class: 2
Class: 2
D. farinae: 1.01 kU/L — ABNORMAL HIGH
DOG DANDER: 3.1 kU/L — AB
Elm IgE: 0.12 kU/L — ABNORMAL HIGH
IgE (Immunoglobulin E), Serum: 2242 kU/L — ABNORMAL HIGH (ref <=114)
Johnson Grass: 0.24 kU/L — ABNORMAL HIGH
Pecan/Hickory Tree IgE: 1.03 kU/L — ABNORMAL HIGH
ROUGH PIGWEED IGE: 0.97 kU/L — AB
Sheep Sorrel IgE: 0.2 kU/L — ABNORMAL HIGH
TIMOTHY GRASS: 1.31 kU/L — AB

## 2017-01-15 LAB — INTERPRETATION:

## 2017-03-01 ENCOUNTER — Other Ambulatory Visit: Payer: Self-pay

## 2017-03-01 DIAGNOSIS — T7840XS Allergy, unspecified, sequela: Secondary | ICD-10-CM

## 2017-03-12 ENCOUNTER — Ambulatory Visit: Payer: 59 | Admitting: Family

## 2017-03-12 ENCOUNTER — Encounter: Payer: Self-pay | Admitting: Family

## 2017-03-12 VITALS — BP 118/64 | HR 93 | Temp 98.2°F | Ht 62.0 in | Wt 224.1 lb

## 2017-03-12 DIAGNOSIS — J069 Acute upper respiratory infection, unspecified: Secondary | ICD-10-CM

## 2017-03-12 DIAGNOSIS — J45909 Unspecified asthma, uncomplicated: Secondary | ICD-10-CM | POA: Diagnosis not present

## 2017-03-12 DIAGNOSIS — J309 Allergic rhinitis, unspecified: Secondary | ICD-10-CM

## 2017-03-12 MED ORDER — FLUTICASONE PROPIONATE 50 MCG/ACT NA SUSP: 2.0000 | Freq: Every day | NASAL | 6 refills | Status: DC

## 2017-03-12 MED ORDER — BUDESONIDE-FORMOTEROL FUMARATE 160-4.5 MCG/ACT IN AERO: 2.0000 | INHALATION_SPRAY | Freq: Two times a day (BID) | RESPIRATORY_TRACT | 3 refills | Status: DC

## 2017-03-12 MED ORDER — BENZONATATE 200 MG PO CAPS: 200.0000 mg | ORAL_CAPSULE | Freq: Three times a day (TID) | ORAL | 0 refills | Status: DC | PRN

## 2017-03-12 NOTE — Progress Notes (Signed)
[name] is a 32 y.o. female with the following history as recorded in Epic[facility]:  Patient Active Problem List   Diagnosis Date Noted  . Allergic rhinitis 03/12/2017  . Hyperglycemia 01/01/2017  . Allergy   . Former smoker   . Anxiety 10/26/2016  . Bronchopneumonia 10/13/2016  . Acute asthma exacerbation 10/13/2016  . Hypokalemia 10/13/2016  . Encounter for general adult medical examination with abnormal findings 04/17/2016  . Class 2 obesity due to excess calories without serious comorbidity with body mass index (BMI) of 37.0 to 37.9 in adult 04/17/2016  . Asthma 03/13/2016  . Borderline personality disorder ([place]) 03/13/2016  . Eczema 03/13/2016  . Cough 01/28/2016  . Antibiotic-associated diarrhea 01/28/2016  . Mood disorder ([place]) 03/04/2013    Current Outpatient Medications  Medication Sig Dispense Refill  . budesonide-formoterol (SYMBICORT) 160-4.5 MCG/ACT inhaler Inhale 2 puffs into the lungs 2 (two) times daily. 1 Inhaler 3  . FLUoxetine (PROZAC) 40 MG capsule Take 1 capsule (40 mg total) by mouth daily. 30 capsule 0  . gabapentin (NEURONTIN) 300 MG capsule Take 1 capsule (300 mg total) by mouth 3 (three) times daily. 90 capsule 2  . hydrOXYzine (ATARAX/VISTARIL) 50 MG tablet Take 50 mg by mouth 2 (two) times daily.     [name] levalbuterol (XOPENEX HFA) 45 MCG/ACT inhaler Inhale 1-2 puffs into the lungs every 6 (six) hours as needed for wheezing. Must keep appt w/new provider for future refills 1 Inhaler 11  . levalbuterol (XOPENEX) 0.63 MG/3ML nebulizer solution Take 3 mLs (0.63 mg total) by nebulization every 8 (eight) hours as needed for wheezing or shortness of breath. 90 mL 1  . LORazepam (ATIVAN) 0.5 MG tablet Take 1 tablet (0.5 mg total) by mouth 2 (two) times daily as needed for anxiety. 10 tablet 0  . norethindrone-ethinyl estradiol (OVCON-50) 1-50 MG-MCG tablet Take 1 tablet by mouth daily.    . paliperidone (INVEGA) 6 MG 24 hr tablet Take 1 tablet (6 mg total) by  mouth daily. 30 tablet 2  . benzonatate (TESSALON) 200 MG capsule Take 1 capsule (200 mg total) by mouth 3 (three) times daily as needed for cough. 20 capsule 0  . fluticasone (FLONASE) 50 MCG/ACT nasal spray Place 2 sprays into both nostrils daily. 16 g 6   No current facility-administered medications for this visit.     Allergies: Cogentin [benztropine]  Past Medical History:  Diagnosis Date  . Allergy   . Asthma   . Borderline personality disorder ([place])   . Former smoker     Past Surgical History:  Procedure Laterality Date  . Thumb surgery Left     Family History  Problem Relation Age of Onset  . Healthy Mother   . Healthy Father   . Alzheimer's disease Maternal Grandmother   . Skin cancer Paternal Grandmother     Social History   Tobacco Use  . Smoking status: Former Smoker    Packs/day: 0.50    Years: 9.00    Pack years: 4.50    Last attempt to quit: 12/04/2015    Years since quitting: 1.2  . Smokeless tobacco: Never Used  Substance Use Topics  . Alcohol use: Yes    Alcohol/week: 4.8 oz    Types: 8 Cans of beer per week    Comment: occ    Subjective:  Patient presents with concerns for cough/ congestion x 2-3 days; "feels like most likely my allergies are causing my symptoms." Currently on Symbicort once a day per pulmonology;  is in the process of establishing with allergist; worries that when her allergies flare up her asthma can become very dangerous; does feel that she is actually doing better today;   Objective:  Vitals:   03/12/17 1009  BP: 118/64  Pulse: 93  Temp: 98.2 F (36.8 C)  TempSrc: Oral  SpO2: 99%  Weight: 224 lb 1.3 oz (101.6 kg)  Height: 5\' 2"  (1.575 m)    General: Well developed, well nourished, in no acute distress  Skin : Warm and dry.  Head: Normocephalic and atraumatic  Eyes: Sclera and conjunctiva clear; pupils round and reactive to light; extraocular movements intact  Ears: External normal; canals clear; tympanic membranes  normal  Oropharynx: Pink, supple. No suspicious lesions  Neck: Supple without thyromegaly, adenopathy  Lungs: Respirations unlabored; wheezing in upper lobes CVS exam: normal rate, regular rhythm, normal S1, S2, no murmurs, rubs, clicks or gallops, normal rate and regular rhythm.  Neurologic: Alert and oriented; speech intact; face symmetrical; moves all extremities well; CNII-XII intact without focal deficit   Assessment:  1. Upper respiratory tract infection, unspecified type   2. Allergic rhinitis, unspecified seasonality, unspecified trigger   3. Uncomplicated asthma, unspecified asthma severity, unspecified whether persistent     Plan:  Do not feel antibiotic warranted; increase Symbicort to 2 puffs bid; Rx for [employer]; Rx for Flonase; increase fluids, rest and follow-up worse, no better.   No Follow-up on file.  No orders of the defined types were placed in this encounter.   Requested Prescriptions   Signed Prescriptions Disp Refills  . benzonatate (TESSALON) 200 MG capsule 20 capsule 0    Sig: Take 1 capsule (200 mg total) by mouth 3 (three) times daily as needed for cough.  . budesonide-formoterol (SYMBICORT) 160-4.5 MCG/ACT inhaler 1 Inhaler 3    Sig: Inhale 2 puffs into the lungs 2 (two) times daily.  . fluticasone (FLONASE) 50 MCG/ACT nasal spray 16 g 6    Sig: Place 2 sprays into both nostrils daily.

## 2017-03-29 ENCOUNTER — Encounter: Payer: Self-pay | Admitting: Allergy and Immunology

## 2017-04-29 ENCOUNTER — Ambulatory Visit: Payer: 59 | Admitting: Pulmonary Disease

## 2017-04-29 ENCOUNTER — Ambulatory Visit (INDEPENDENT_AMBULATORY_CARE_PROVIDER_SITE_OTHER): Payer: 59 | Admitting: Pulmonary Disease

## 2017-04-29 ENCOUNTER — Encounter: Payer: Self-pay | Admitting: Pulmonary Disease

## 2017-04-29 VITALS — BP 122/76 | HR 76 | Ht 62.0 in | Wt 224.0 lb

## 2017-04-29 DIAGNOSIS — J45909 Unspecified asthma, uncomplicated: Secondary | ICD-10-CM

## 2017-04-29 DIAGNOSIS — J4530 Mild persistent asthma, uncomplicated: Secondary | ICD-10-CM

## 2017-04-29 LAB — PULMONARY FUNCTION TEST
DL/VA % pred: 96 %
DL/VA: 4.4 ml/min/mmHg/L
DLCO UNC % PRED: 96 %
DLCO UNC: 20.7 ml/min/mmHg
FEF 25-75 PRE: 2.02 L/s
FEF 25-75 Post: 2.71 L/sec
FEF2575-%Change-Post: 33 %
FEF2575-%PRED-POST: 80 %
FEF2575-%Pred-Pre: 60 %
FEV1-%CHANGE-POST: 15 %
FEV1-%PRED-POST: 93 %
FEV1-%PRED-PRE: 80 %
FEV1-POST: 2.81 L
FEV1-Pre: 2.43 L
FEV1FVC-%Change-Post: 9 %
FEV1FVC-%PRED-PRE: 85 %
FEV6-%Change-Post: 5 %
FEV6-%PRED-POST: 100 %
FEV6-%PRED-PRE: 95 %
FEV6-POST: 3.53 L
FEV6-PRE: 3.35 L
FEV6FVC-%PRED-POST: 101 %
FEV6FVC-%PRED-PRE: 101 %
FVC-%CHANGE-POST: 5 %
FVC-%PRED-POST: 99 %
FVC-%PRED-PRE: 94 %
FVC-POST: 3.53 L
FVC-PRE: 3.35 L
PRE FEV6/FVC RATIO: 100 %
Post FEV1/FVC ratio: 80 %
Post FEV6/FVC ratio: 100 %
Pre FEV1/FVC ratio: 72 %
RV % PRED: 124 %
RV: 1.58 L
TLC % pred: 101 %
TLC: 4.81 L

## 2017-04-29 LAB — NITRIC OXIDE: Nitric Oxide: 11

## 2017-04-29 MED ORDER — MONTELUKAST SODIUM 10 MG PO TABS: 10.0000 mg | ORAL_TABLET | Freq: Every day | ORAL | 11 refills | Status: DC

## 2017-04-29 NOTE — Progress Notes (Signed)
[name]    [id number]    November 17, 1985  Primary Care Physician:John, Hunt Oris, MD  Referring Physician: Biagio Borg, MD [place], [place] [address]  Chief complaint: Follow-up for mild persistent asthma  HPI: 32 year old with history of asthma, allergies, borderline personality disorder.  She was diagnosed with asthma in 2006 and has been intermittently on inhalers.  She has frequent exacerbations with pneumonias.  She had an admission in September 2018 for acute asthma exacerbation with bronchopneumonia treated with ceftriaxone, doxy, steroids and nebs.  She had an ED visit in November 2018 for asthma exacerbation.  Seen by Dr. Jenny Reichmann, her primary care doctor in early December and she was given a Depo shot, prednisone and prescribed Symbicort but she has not picked up the new inhaler yet.  She is on albuterol inhaler which she uses several times a day She has daily symptoms of dyspnea with activity and rest with nonproductive cough, wheezing.  There are no nocturnal awakening.   Pets: 4 cats, 2 dogs.  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  She is in the process of changing house Smoking history: 5-pack-year tobacco history.  Quit in 2017.  She smokes marijuana daily. Travel History: Not significant  Interim history: Started on Symbicort with mild improvement in symptoms.  She has changed her previous house which had mold in it and is now living in a house with no carpets, hardwood floors.  Outpatient Encounter Medications as of 04/29/2017  Medication Sig  . budesonide-formoterol (SYMBICORT) 160-4.5 MCG/ACT inhaler Inhale 2 puffs into the lungs 2 (two) times daily.  [name] FLUoxetine (PROZAC) 40 MG capsule Take 1 capsule (40 mg total) by mouth daily.  . fluticasone (FLONASE) 50 MCG/ACT nasal spray Place 2 sprays into both nostrils daily.  [name] gabapentin (NEURONTIN) 300 MG capsule Take 1 capsule  (300 mg total) by mouth 3 (three) times daily.  . hydrOXYzine (ATARAX/VISTARIL) 50 MG tablet Take 50 mg by mouth 2 (two) times daily.   [name] levalbuterol (XOPENEX HFA) 45 MCG/ACT inhaler Inhale 1-2 puffs into the lungs every 6 (six) hours as needed for wheezing. Must keep appt w/new provider for future refills  . levalbuterol (XOPENEX) 0.63 MG/3ML nebulizer solution Take 3 mLs (0.63 mg total) by nebulization every 8 (eight) hours as needed for wheezing or shortness of breath.  [name] LORazepam (ATIVAN) 0.5 MG tablet Take 1 tablet (0.5 mg total) by mouth 2 (two) times daily as needed for anxiety.  . norethindrone-ethinyl estradiol (OVCON-50) 1-50 MG-MCG tablet Take 1 tablet by mouth daily.  . paliperidone (INVEGA) 6 MG 24 hr tablet Take 1 tablet (6 mg total) by mouth daily.  . [DISCONTINUED] benzonatate (TESSALON) 200 MG capsule Take 1 capsule (200 mg total) by mouth 3 (three) times daily as needed for cough.   No facility-administered encounter medications on file as of 04/29/2017.     Allergies as of 04/29/2017 - Review Complete 04/29/2017  Allergen Reaction Noted  . Cogentin [benztropine]  08/12/2013    Past Medical History:  Diagnosis Date  . Allergy   . Asthma   . Borderline personality disorder ([place])   . Former smoker     Past Surgical History:  Procedure Laterality Date  . Thumb surgery Left     Family History  Problem Relation Age of Onset  . Healthy Mother   . Healthy Father   .  Alzheimer's disease Maternal Grandmother   . Skin cancer Paternal Grandmother     Social History   Socioeconomic History  . Marital status: Single    Spouse name: Not on file  . Number of children: 0  . Years of education: 66  . Highest education level: Not on file  Occupational History  . Not on file  Social Needs  . Financial resource strain: Not on file  . Food insecurity:    Worry: Not on file    Inability: Not on file  . Transportation needs:    Medical: Not on file    Non-medical:  Not on file  Tobacco Use  . Smoking status: Former Smoker    Packs/day: 0.50    Years: 9.00    Pack years: 4.50    Last attempt to quit: 12/04/2015    Years since quitting: 1.4  . Smokeless tobacco: Never Used  Substance and Sexual Activity  . Alcohol use: Yes    Alcohol/week: 4.8 oz    Types: 8 Cans of beer per week    Comment: occ  . Drug use: Yes    Frequency: 4.0 times per week    Types: Marijuana  . Sexual activity: Yes    Birth control/protection: Pill  Lifestyle  . Physical activity:    Days per week: Not on file    Minutes per session: Not on file  . Stress: Not on file  Relationships  . Social connections:    Talks on phone: Not on file    Gets together: Not on file    Attends religious service: Not on file    Active member of club or organization: Not on file    Attends meetings of clubs or organizations: Not on file    Relationship status: Not on file  . Intimate partner violence:    Fear of current or ex partner: Not on file    Emotionally abused: Not on file    Physically abused: Not on file    Forced sexual activity: Not on file  Other Topics Concern  . Not on file  Social History Narrative   Fun: Play video games and watch movies.   Denies abuse and feels safe at home.    Review of systems: Review of Systems  Constitutional: Negative for fever and chills.  HENT: Negative.   Eyes: Negative for blurred vision.  Respiratory: as per HPI  Cardiovascular: Negative for chest pain and palpitations.  Gastrointestinal: Negative for vomiting, diarrhea, blood per rectum. Genitourinary: Negative for dysuria, urgency, frequency and hematuria.  Musculoskeletal: Negative for myalgias, back pain and joint pain.  Skin: Negative for itching and rash.  Neurological: Negative for dizziness, tremors, focal weakness, seizures and loss of consciousness.  Endo/Heme/Allergies: Negative for environmental allergies.  Psychiatric/Behavioral: Negative for depression, suicidal  ideas and hallucinations.  All other systems reviewed and are negative.  Physical Exam: Blood pressure 122/76, pulse 76, height 5\' 2"  (1.575 m), weight 224 lb (101.6 kg), SpO2 99 %. Gen:      No acute distress HEENT:  EOMI, sclera anicteric Neck:     No masses; no thyromegaly Lungs:    Clear to auscultation bilaterally; normal respiratory effort CV:         Regular rate and rhythm; no murmurs Abd:      + bowel sounds; soft, non-tender; no palpable masses, no distension Ext:    No edema; adequate peripheral perfusion Skin:      Warm and dry; no rash Neuro:  alert and oriented x 3 Psych: normal mood and affect  Data Reviewed: FENO 01/14/17-14 FENO 04/19/17-11  CT chest 10/13/16-no pulmonary embolism, patchy bilateral groundglass opacity Chest x-ray 12/28/16- no acute cardiopulmonary abnormality I reviewed the images personally  PFTs 04/29/17 FVC 3.25 [94%], FEV1 2.43 [80%], F/F 72, TLC 101%, RV/TLC 129%, DLCO 96% Minimal obstruction with small airways disease, bronchodilator response, air-trapping.  CBC 01/14/17-WBC 10.2, eos 2.7%, absolute eosinophil count 300 Blood allergy profile 01/14/17- IgE 2242, RAST shows significant sensitivity to multiple allergens including cat, dog  Assessment:  Mild persistent asthma Continues on Symbicort.  Blood test reviewed which shows significant elevation in IgE and reactivity to multiple allergens including cat, dog.  She has a cat and dog at home and is not interested in getting rid of pets.  PFTs showed minimal obstructive changes with improvement in flow rates post bronchodilator.  Continue Symbicort.  Add Singulair Allergy referral is pending Continue over-the-counter antihistamine and nasal steroid  Plan/Recommendations: - Continue Symbicort, albuterol as needed - Start Singulair  Marshell Garfinkel MD [facility] Pager [phone] 04/29/2017, 1:27 PM  CC: Biagio Borg, MD

## 2017-04-29 NOTE — Progress Notes (Signed)
PFT completed today.  

## 2017-04-29 NOTE — Patient Instructions (Addendum)
We will start singular once daily Continue Symbicort Follow-up with allergy referral Your blood testing shows significant allergies to multiple agents including cats, dogs Your lung function test shows mild changes of asthma.  There is no evidence of COPD Follow-up in 3 months.

## 2017-06-10 ENCOUNTER — Ambulatory Visit: Payer: 59 | Admitting: Allergy & Immunology

## 2017-07-01 ENCOUNTER — Encounter: Payer: Self-pay | Admitting: Allergy & Immunology

## 2017-07-01 ENCOUNTER — Ambulatory Visit: Payer: 59 | Admitting: Allergy & Immunology

## 2017-07-01 VITALS — BP 118/78 | HR 72 | Temp 98.2°F | Resp 16 | Ht 63.0 in | Wt 229.2 lb

## 2017-07-01 DIAGNOSIS — J3089 Other allergic rhinitis: Secondary | ICD-10-CM | POA: Diagnosis not present

## 2017-07-01 DIAGNOSIS — J302 Other seasonal allergic rhinitis: Secondary | ICD-10-CM | POA: Diagnosis not present

## 2017-07-01 DIAGNOSIS — J4540 Moderate persistent asthma, uncomplicated: Secondary | ICD-10-CM | POA: Diagnosis not present

## 2017-07-01 DIAGNOSIS — T781XXD Other adverse food reactions, not elsewhere classified, subsequent encounter: Secondary | ICD-10-CM

## 2017-07-01 MED ORDER — AZELASTINE HCL 0.1 % NA SOLN: NASAL | 3 refills | Status: DC

## 2017-07-01 NOTE — Patient Instructions (Addendum)
1. Moderate persistent asthma, uncomplicated - Lung testing looked fairly good today.  - We will defer management of this to Dr. Rolla Etienne. - Spacer sample and demonstration provided. - Daily controller medication(s): Singulair 10mg  daily and Symbicort 160/4.37mcg two puffs twice daily with spacer - Prior to physical activity: ProAir 2 puffs 10-15 minutes before physical activity. - Rescue medications: ProAir 4 puffs every 4-6 hours as needed - Asthma control goals:  * Full participation in all desired activities (may need albuterol before activity) * Albuterol use two time or less a week on average (not counting use with activity) * Cough interfering with sleep two time or less a month * Oral steroids no more than once a year * No hospitalizations  2. Seasonal and perennial allergic rhinitis - Testing today showed: a few more grasses, a few more trees, and horse - Previous lab work showed positives to dust mites, cats, dogs, cockroach, trees, weeds, and grass as well as mouse - Avoidance measures provided. - Continue with: Hydroxyzine one tablet twice daily, Singulair (montelukast) 10mg  daily and Flonase (fluticasone) two sprays per nostril daily - Start taking: Astelin (azelastine) 2 sprays per nostril 1-2 times daily as needed - You can use an extra dose of the antihistamine, if needed, for breakthrough symptoms.  - Consider nasal saline rinses 1-2 times daily to remove allergens from the nasal cavities as well as help with mucous clearance (this is especially helpful to do before the nasal sprays are given) - Consider allergy shots as a means of long-term control. - Allergy shots "re-train" and "reset" the immune system to ignore environmental allergens and decrease the resulting immune response to those allergens (sneezing, itchy watery eyes, runny nose, nasal congestion, etc).    - Allergy shots improve symptoms in 75-85% of patients.  - Consider starting them while you have good  insurance, especially since you will not be getting rid of your fur babies.   3. Adverse food reactions - Testing was negative to the most common foods (peanut, sesame, cashew, soy, fish mix, shellfish mix, wheat, milk, casein, egg) - There is a the low positive predictive value of food allergy testing and hence the high possibility of false positives. - In contrast, food allergy testing has a high negative predictive value, therefore if testing is negative we can be relatively assured that they are indeed negative.  - There is no need to avoid any of these foods.   4. Return in about 3 months (around 10/01/2017).   Please inform us of any Emergency Department visits, hospitalizations, or changes in symptoms. Call us before going to the ED for breathing or allergy symptoms since we might be able to fit you in for a sick visit. Feel free to contact us anytime with any questions, problems, or concerns.  It was a pleasure to meet you today!  Websites that have reliable patient information: 1. American Academy of Asthma, Allergy, and Immunology: www.aaaai.org 2. Food Allergy Research and Education (FARE): foodallergy.org 3. Mothers of Asthmatics: http://www.asthmacommunitynetwork.org 4. American College of Allergy, Asthma, and Immunology: MonthlyElectricBill.co.uk   Make sure you are registered to vote!    Reducing Pollen Exposure  The American Academy of Allergy, Asthma and Immunology suggests the following steps to reduce your exposure to pollen during allergy seasons.    1. Do not hang sheets or clothing out to dry; pollen may collect on these items. 2. Do not mow lawns or spend time around freshly cut grass; mowing stirs up pollen. 3. Keep  windows closed at night.  Keep car windows closed while driving. 4. Minimize morning activities outdoors, a time when pollen counts are usually at their highest. 5. Stay indoors as much as possible when pollen counts or humidity is high and on windy days when  pollen tends to remain in the air longer. 6. Use air conditioning when possible.  Many air conditioners have filters that trap the pollen spores. 7. Use a HEPA room air filter to remove pollen form the indoor air you breathe.  Control of Mold Allergen   Mold and fungi can grow on a variety of surfaces provided certain temperature and moisture conditions exist.  Outdoor molds grow on plants, decaying vegetation and soil.  The major outdoor mold, Alternaria and Cladosporium, are found in very high numbers during hot and dry conditions.  Generally, a late Summer - Fall peak is seen for common outdoor fungal spores.  Rain will temporarily lower outdoor mold spore count, but counts rise rapidly when the rainy period ends.  The most important indoor molds are Aspergillus and Penicillium.  Dark, humid and poorly ventilated basements are ideal sites for mold growth.  The next most common sites of mold growth are the bathroom and the kitchen.  Outdoor (Seasonal) Mold Control   1. Use air conditioning and keep windows closed 2. Avoid exposure to decaying vegetation. 3. Avoid leaf raking. 4. Avoid grain handling. 5. Consider wearing a face mask if working in moldy areas.  6.   Indoor (Perennial) Mold Control   1. Maintain humidity below 50%. 2. Clean washable surfaces with 5% bleach solution. 3. Remove sources e.g. contaminated carpets.     Control of House Dust Mite Allergen    House dust mites play a major role in allergic asthma and rhinitis.  They occur in environments with high humidity wherever human skin, the food for dust mites is found. High levels have been detected in dust obtained from mattresses, pillows, carpets, upholstered furniture, bed covers, clothes and soft toys.  The principal allergen of the house dust mite is found in its feces.  A gram of dust may contain 1,000 mites and 250,000 fecal particles.  Mite antigen is easily measured in the air during house cleaning  activities.    1. Encase mattresses, including the box spring, and pillow, in an air tight cover.  Seal the zipper end of the encased mattresses with wide adhesive tape. 2. Wash the bedding in water of 130 degrees Farenheit weekly.  Avoid cotton comforters/quilts and flannel bedding: the most ideal bed covering is the dacron comforter. 3. Remove all upholstered furniture from the bedroom. 4. Remove carpets, carpet padding, rugs, and non-washable window drapes from the bedroom.  Wash drapes weekly or use plastic window coverings. 5. Remove all non-washable stuffed toys from the bedroom.  Wash stuffed toys weekly. 6. Have the room cleaned frequently with a vacuum cleaner and a damp dust-mop.  The patient should not be in a room which is being cleaned and should wait 1 hour after cleaning before going into the room. 7. Close and seal all heating outlets in the bedroom.  Otherwise, the room will become filled with dust-laden air.  An electric heater can be used to heat the room. 8. Reduce indoor humidity to less than 50%.  Do not use a humidifier.  Control of Cockroach Allergen  Cockroach allergen has been identified as an important cause of acute attacks of asthma, especially in urban settings.  There are fifty-five species of cockroach  that exist in the [place], however only three, the [place], [place] and [place] species produce allergen that can affect patients with Asthma.  Allergens can be obtained from fecal particles, egg casings and secretions from cockroaches.    1. Remove food sources. 2. Reduce access to water. 3. Seal access and entry points. 4. Spray runways with 0.5-1% Diazinon or Chlorpyrifos 5. Blow boric acid power under stoves and refrigerator. 6. Place bait stations (hydramethylnon) at feeding sites.  Control of Dog or Cat Allergen  Avoidance is the best way to manage a dog or cat allergy. If you have a dog or cat and are allergic to dog or cats, consider removing the  dog or cat from the home. If you have a dog or cat but don't want to find it a new home, or if your family wants a pet even though someone in the household is allergic, here are some strategies that may help keep symptoms at bay:  1. Keep the pet out of your bedroom and restrict it to only a few rooms. Be advised that keeping the dog or cat in only one room will not limit the allergens to that room. 2. Don't pet, hug or kiss the dog or cat; if you do, wash your hands with soap and water. 3. High-efficiency particulate air (HEPA) cleaners run continuously in a bedroom or living room can reduce allergen levels over time. 4. Regular use of a high-efficiency vacuum cleaner or a central vacuum can reduce allergen levels. 5. Giving your dog or cat a bath at least once a week can reduce airborne allergen.  Allergy Shots   Allergies are the result of a chain reaction that starts in the immune system. Your immune system controls how your body defends itself. For instance, if you have an allergy to pollen, your immune system identifies pollen as an invader or allergen. Your immune system overreacts by producing antibodies called Immunoglobulin E (IgE). These antibodies travel to cells that release chemicals, causing an allergic reaction.  The concept behind allergy immunotherapy, whether it is received in the form of shots or tablets, is that the immune system can be desensitized to specific allergens that trigger allergy symptoms. Although it requires time and patience, the payback can be long-term relief.  How Do Allergy Shots Work?  Allergy shots work much like a vaccine. Your body responds to injected amounts of a particular allergen given in increasing doses, eventually developing a resistance and tolerance to it. Allergy shots can lead to decreased, minimal or no allergy symptoms.  There generally are two phases: build-up and maintenance. Build-up often ranges from three to six months and involves  receiving injections with increasing amounts of the allergens. The shots are typically given once or twice a week, though more rapid build-up schedules are sometimes used.  The maintenance phase begins when the most effective dose is reached. This dose is different for each person, depending on how allergic you are and your response to the build-up injections. Once the maintenance dose is reached, there are longer periods between injections, typically two to four weeks.  Occasionally doctors give cortisone-type shots that can temporarily reduce allergy symptoms. These types of shots are different and should not be confused with allergy immunotherapy shots.  Who Can Be Treated with Allergy Shots?  Allergy shots may be a good treatment approach for people with allergic rhinitis (hay fever), allergic asthma, conjunctivitis (eye allergy) or stinging insect allergy.   Before deciding to begin allergy shots,  you should consider:  . The length of allergy season and the severity of your symptoms . Whether medications and/or changes to your environment can control your symptoms . Your desire to avoid long-term medication use . Time: allergy immunotherapy requires a major time commitment . Cost: may vary depending on your insurance coverage  Allergy shots for children age 31 and older are effective and often well tolerated. They might prevent the onset of new allergen sensitivities or the progression to asthma.  Allergy shots are not started on patients who are pregnant but can be continued on patients who become pregnant while receiving them. In some patients with other medical conditions or who take certain common medications, allergy shots may be of risk. It is important to mention other medications you talk to your allergist.   When Will I Feel Better?  Some may experience decreased allergy symptoms during the build-up phase. For others, it may take as long as 12 months on the maintenance dose.  If there is no improvement after a year of maintenance, your allergist will discuss other treatment options with you.  If you aren't responding to allergy shots, it may be because there is not enough dose of the allergen in your vaccine or there are missing allergens that were not identified during your allergy testing. Other reasons could be that there are high levels of the allergen in your environment or major exposure to non-allergic triggers like tobacco smoke.  What Is the Length of Treatment?  Once the maintenance dose is reached, allergy shots are generally continued for three to five years. The decision to stop should be discussed with your allergist at that time. Some people may experience a permanent reduction of allergy symptoms. Others may relapse and a longer course of allergy shots can be considered.  What Are the Possible Reactions?  The two types of adverse reactions that can occur with allergy shots are local and systemic. Common local reactions include very mild redness and swelling at the injection site, which can happen immediately or several hours after. A systemic reaction, which is less common, affects the entire body or a particular body system. They are usually mild and typically respond quickly to medications. Signs include increased allergy symptoms such as sneezing, a stuffy nose or hives.  Rarely, a serious systemic reaction called anaphylaxis can develop. Symptoms include swelling in the throat, wheezing, a feeling of tightness in the chest, nausea or dizziness. Most serious systemic reactions develop within 30 minutes of allergy shots. This is why it is strongly recommended you wait in your doctor's office for 30 minutes after your injections. Your allergist is trained to watch for reactions, and his or her staff is trained and equipped with the proper medications to identify and treat them.  Who Should Administer Allergy Shots?  The preferred location for receiving  shots is your prescribing allergist's office. Injections can sometimes be given at another facility where the physician and staff are trained to recognize and treat reactions, and have received instructions by your prescribing allergist.

## 2017-07-01 NOTE — Progress Notes (Signed)
NEW PATIENT  Date of Service/Encounter:  07/01/17  Referring provider: Biagio Borg, MD   Assessment:   Moderate persistent asthma, uncomplicated  Seasonal and perennial allergic rhinitis (dust mites, cats, dogs, cockroach, trees, weeds, grass, mouse, horse)  Adverse food reaction - with negative testing to the most common foods today  Plan/Recommendations:   1. Moderate persistent asthma, uncomplicated - Lung testing looked fairly good today.  - We will defer management of this to Dr. Rolla Etienne. - Spacer sample and demonstration provided. - Daily controller medication(s): Singulair 10mg  daily and Symbicort 160/4.73mcg two puffs twice daily with spacer - Prior to physical activity: ProAir 2 puffs 10-15 minutes before physical activity. - Rescue medications: ProAir 4 puffs every 4-6 hours as needed - Asthma control goals:  * Full participation in all desired activities (may need albuterol before activity) * Albuterol use two time or less a week on average (not counting use with activity) * Cough interfering with sleep two time or less a month * Oral steroids no more than once a year * No hospitalizations  2. Seasonal and perennial allergic rhinitis - Testing today showed: a few more grasses, a few more trees, and horse - Previous lab work showed positives to dust mites, cats, dogs, cockroach, trees, weeds, and grass as well as mouse - Avoidance measures provided. - Continue with: Hydroxyzine one tablet twice daily, Singulair (montelukast) 10mg  daily and Flonase (fluticasone) two sprays per nostril daily - Start taking: Astelin (azelastine) 2 sprays per nostril 1-2 times daily as needed - You can use an extra dose of the antihistamine, if needed, for breakthrough symptoms.  - Consider nasal saline rinses 1-2 times daily to remove allergens from the nasal cavities as well as help with mucous clearance (this is especially helpful to do before the nasal sprays are given) - Consider  allergy shots as a means of long-term control. - Allergy shots "re-train" and "reset" the immune system to ignore environmental allergens and decrease the resulting immune response to those allergens (sneezing, itchy watery eyes, runny nose, nasal congestion, etc).    - Allergy shots improve symptoms in 75-85% of patients.  - Consider starting them while you have good insurance, especially since you will not be getting rid of your fur babies.   3. Adverse food reactions - Testing was negative to the most common foods (peanut, sesame, cashew, soy, fish mix, shellfish mix, wheat, milk, casein, egg) - There is a the low positive predictive value of food allergy testing and hence the high possibility of false positives. - In contrast, food allergy testing has a high negative predictive value, therefore if testing is negative we can be relatively assured that they are indeed negative.  - There is no need to avoid any of these foods.   4. Follow up in three months.  Subjective:   [name] is a 32 y.o. female presenting today for evaluation of  Chief Complaint  Patient presents with  . Asthma    [facility] thinks that allergies are making asthma worse    BOOTS MCGLOWN has a history of the following: Patient Active Problem List   Diagnosis Date Noted  . Allergic rhinitis 03/12/2017  . Hyperglycemia 01/01/2017  . Allergy   . Former smoker   . Anxiety 10/26/2016  . Bronchopneumonia 10/13/2016  . Acute asthma exacerbation 10/13/2016  . Hypokalemia 10/13/2016  . Encounter for general adult medical examination with abnormal findings 04/17/2016  . Class 2 obesity due to excess  calories without serious comorbidity with body mass index (BMI) of 37.0 to 37.9 in adult 04/17/2016  . Asthma 03/13/2016  . Borderline personality disorder ([place]) 03/13/2016  . Eczema 03/13/2016  . Cough 01/28/2016  . Antibiotic-associated diarrhea 01/28/2016  . Mood disorder ([place]) 03/04/2013     History obtained from: chart review and patient.  [name] was referred by Biagio Borg, MD.     [name] is a 32 y.o. female presenting for an evaluation of allergies. She also has a history of asthma, which is followed by Dr. Rolla Etienne.    Asthma/Respiratory Symptom History: She is followed by Dr. Rolla Etienne. She was diagnosed when she was in grade school. She does have a nebulizer machine. She is currently on Symbicort and hydroxyzine. She is doing well on the Symbicort but she tends to forget to use it in the mornings, so typically only receives it in the evenings. She has not needed prednisone since seeking care from Dr. Rolla Etienne.   Allergic Rhinitis Symptom History: She is on Flonase and Singulair. She takes hydroxyzine for nervous fidgeting, but has noticed that this helps with her allergies as well. Prior to this, she was on Zyrtec. She did have testing performed five months ago via the blood work that demonstrated positives to multiple allergens including her cat (dust mites, cats, dogs, cockroach, trees, weeds, and grass as well as mouse). These results are in [place]. Currently she has two cats and two more who are missing. There are also three dogs at home. She has never been on allergy shots.   Food Allergy Symptom History: [name] does have some vague symptoms to some of the foods, although none are consistent with allergies. She would like to be tested "just in case". She has never needed epinephrine from any of the reactions whatsoever.   She does have urticaria recently which she attributes to stres. They recently moved and two of their cats went missing.   Otherwise, there is no history of other atopic diseases, including drug allergies, stinging insect allergies, or urticaria. There is no significant infectious history. Vaccinations are up to date.     Past Medical History: Patient Active Problem List   Diagnosis Date Noted  . Allergic rhinitis 03/12/2017  . Hyperglycemia  01/01/2017  . Allergy   . Former smoker   . Anxiety 10/26/2016  . Bronchopneumonia 10/13/2016  . Acute asthma exacerbation 10/13/2016  . Hypokalemia 10/13/2016  . Encounter for general adult medical examination with abnormal findings 04/17/2016  . Class 2 obesity due to excess calories without serious comorbidity with body mass index (BMI) of 37.0 to 37.9 in adult 04/17/2016  . Asthma 03/13/2016  . Borderline personality disorder ([place]) 03/13/2016  . Eczema 03/13/2016  . Cough 01/28/2016  . Antibiotic-associated diarrhea 01/28/2016  . Mood disorder ([place]) 03/04/2013    Medication List:  Allergies as of 07/01/2017      Reactions   Cogentin [benztropine]       Medication List        Accurate as of 07/01/17 11:59 PM. Always use your most recent med list.          azelastine 0.1 % nasal spray Commonly known as:  ASTELIN Use 2 sprays per nostril 1-2 times daily   budesonide-formoterol 160-4.5 MCG/ACT inhaler Commonly known as:  SYMBICORT Inhale 2 puffs into the lungs 2 (two) times daily.   FLUoxetine 40 MG capsule Commonly known as:  PROZAC Take 1 capsule (40 mg total) by mouth daily.  fluticasone 50 MCG/ACT nasal spray Commonly known as:  FLONASE Place 2 sprays into both nostrils daily.   gabapentin 300 MG capsule Commonly known as:  NEURONTIN Take 1 capsule (300 mg total) by mouth 3 (three) times daily.   hydrOXYzine 50 MG tablet Commonly known as:  ATARAX/VISTARIL Take 50 mg by mouth 2 (two) times daily.   levalbuterol 0.63 MG/3ML nebulizer solution Commonly known as:  XOPENEX Take 3 mLs (0.63 mg total) by nebulization every 8 (eight) hours as needed for wheezing or shortness of breath.   levalbuterol 45 MCG/ACT inhaler Commonly known as:  XOPENEX HFA Inhale 1-2 puffs into the lungs every 6 (six) hours as needed for wheezing. Must keep appt w/new provider for future refills   LORazepam 0.5 MG tablet Commonly known as:  ATIVAN Take 1 tablet (0.5 mg total) by  mouth 2 (two) times daily as needed for anxiety.   montelukast 10 MG tablet Commonly known as:  SINGULAIR Take 1 tablet (10 mg total) by mouth at bedtime.   norethindrone-ethinyl estradiol 1-50 MG-MCG tablet Commonly known as:  OVCON-50 Take 1 tablet by mouth daily.   paliperidone 6 MG 24 hr tablet Commonly known as:  INVEGA Take 1 tablet (6 mg total) by mouth daily.       Birth History: non-contributory.  Developmental History: non-contributory.   Past Surgical History: Past Surgical History:  Procedure Laterality Date  . Thumb surgery Left      Family History: Family History  Problem Relation Age of Onset  . Healthy Mother   . Healthy Father   . Alzheimer's disease Maternal Grandmother   . Skin cancer Paternal Grandmother   . Allergic rhinitis Paternal Grandmother   . Asthma Paternal Grandmother   . Angioedema Neg Hx   . Eczema Neg Hx   . Urticaria Neg Hx      Social History: [name] lives at home with her partner and there are multiple animals including dogs and cats. She was born in [place] but has been here since the 3rd grade.  She lives in a house that was built in 1992, but previously lived in a very old home with black mold.  There is hardwood and vinyl throughout the entire home.  They have electric heating and central cooling.  There are no dust mite covers on the bedding.  There is no tobacco exposure in the car or the house.  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  She does have a GED and did attend some college (majoring in art).  However, she had to stop due to a history of debilitating anxiety.  She is planning to look for a different job which pays more and has benefits.    Review of Systems: a 14-point review of systems is pertinent for what is mentioned in HPI.  Otherwise, all other systems were negative. Constitutional: negative other than that listed in the HPI Eyes: negative other than that listed in the HPI Ears, nose,  mouth, throat, and face: negative other than that listed in the HPI Respiratory: negative other than that listed in the HPI Cardiovascular: negative other than that listed in the HPI Gastrointestinal: negative other than that listed in the HPI Genitourinary: negative other than that listed in the HPI Integument: negative other than that listed in the HPI Hematologic: negative other than that listed in the HPI Musculoskeletal: negative other than that listed in the HPI Neurological: negative other than that listed in the HPI  Allergy/Immunologic: negative other than that listed in the HPI    Objective:   Blood pressure 118/78, pulse 72, temperature 98.2 F (36.8 C), temperature source Oral, resp. rate 16, height 5\' 3"  (1.6 m), weight 229 lb 3.2 oz (104 kg). Body mass index is 40.6 kg/m.   Physical Exam:  General: Alert, interactive, in no acute distress. Obese female. Talkative.  Eyes: No conjunctival injection bilaterally, no discharge on the right, no discharge on the left and no Horner-Trantas dots present. PERRL bilaterally. EOMI without pain. No photophobia.  Ears: Right TM pearly gray with normal light reflex, Left TM pearly gray with normal light reflex, Right TM intact without perforation and Left TM intact without perforation.  Nose/Throat: External nose within normal limits and septum midline. Turbinates edematous and pale with clear discharge. Posterior oropharynx erythematous with cobblestoning in the posterior oropharynx. Tonsils 3+ without exudates.  Tongue without thrush and Geographic tongue present. Neck: Supple without thyromegaly. Trachea midline. Adenopathy: shoddy bilateral anterior cervical lymphadenopathy and no enlarged lymph nodes appreciated in the occipital, axillary, epitrochlear, inguinal, or popliteal regions. Lungs: Clear to auscultation without wheezing, rhonchi or rales. No increased work of breathing. CV: Normal S1/S2. No murmurs. Capillary refill <2  seconds.  Abdomen: Nondistended, nontender. No guarding or rebound tenderness. Bowel sounds present in all fields and hypoactive  Skin: Warm and dry, without lesions or rashes. Multiple tattoos.  Extremities:  No clubbing, cyanosis or edema. Neuro:   Grossly intact. No focal deficits appreciated. Responsive to questions.  Diagnostic studies:   Spirometry: results normal (FEV1: 2.42/78%, FVC: 2.79/76%, FEV1/FVC: 87%).    Spirometry consistent with possible restrictive disease.   Allergy Studies:   Indoor/Outdoor Percutaneous Adult Environmental Panel: positive to [place] blue grass, meadow fescue grass, sweet vernal grass, hickory, oak and horse. Otherwise negative with adequate controls.  Most Common Foods Panel (peanut, cashew, soy, fish mix, shellfish mix, wheat, milk, egg): negative to all with adequate controls   Allergy testing results were read and interpreted by myself, documented by clinical staff.       Salvatore Marvel, MD [facility]

## 2017-07-03 ENCOUNTER — Encounter: Payer: Self-pay | Admitting: Allergy & Immunology

## 2017-07-30 ENCOUNTER — Ambulatory Visit: Payer: 59 | Admitting: Pulmonary Disease

## 2017-07-30 ENCOUNTER — Encounter: Payer: Self-pay | Admitting: Pulmonary Disease

## 2017-07-30 VITALS — BP 124/68 | HR 90 | Ht 63.0 in | Wt 231.0 lb

## 2017-07-30 DIAGNOSIS — J4530 Mild persistent asthma, uncomplicated: Secondary | ICD-10-CM | POA: Diagnosis not present

## 2017-07-30 NOTE — Patient Instructions (Signed)
I am glad that your breathing is doing well on the Symbicort Continue the current inhalers I will follow back with you in 6 months.

## 2017-07-30 NOTE — Progress Notes (Signed)
[name]    [id number]    07-Aug-1985  Primary Care Physician:John, Hunt Oris, MD  Referring Physician: Biagio Borg, MD [place], [place] [address]  Chief complaint: Follow-up for mild persistent asthma  HPI: 32 year old with history of asthma, allergies, borderline personality disorder.  She was diagnosed with asthma in 2006 and has been intermittently on inhalers.  She has frequent exacerbations with pneumonias.  She had an admission in September 2018 for acute asthma exacerbation with bronchopneumonia treated with ceftriaxone, doxy, steroids and nebs.  She had an ED visit in November 2018 for asthma exacerbation.  Seen by Dr. Jenny Reichmann, her primary care doctor in early December and she was given a Depo shot, prednisone and prescribed Symbicort but she has not picked up the new inhaler yet.  She is on albuterol inhaler which she uses several times a day She has daily symptoms of dyspnea with activity and rest with nonproductive cough, wheezing.  There are no nocturnal awakening.   Pets: 4 cats, 2 dogs.  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  She is in the process of changing house Smoking history: 5-pack-year tobacco history.  Quit in 2017.  She smokes marijuana daily. Travel History: Not significant  Interim history: Continues on Symbicort with good control of symptoms.  Hardly needs to use her albuterol inhaler She is followed up with Dr. Ernst Bowler at the allergy center who recommended desensitization therapy.  Outpatient Encounter Medications as of 07/30/2017  Medication Sig  . azelastine (ASTELIN) 0.1 % nasal spray Use 2 sprays per nostril 1-2 times daily  . budesonide-formoterol (SYMBICORT) 160-4.5 MCG/ACT inhaler Inhale 2 puffs into the lungs 2 (two) times daily.  [name] FLUoxetine (PROZAC) 40 MG capsule Take 1 capsule (40 mg total) by mouth daily.  . fluticasone (FLONASE) 50 MCG/ACT nasal  spray Place 2 sprays into both nostrils daily.  [name] gabapentin (NEURONTIN) 300 MG capsule Take 1 capsule (300 mg total) by mouth 3 (three) times daily.  . hydrOXYzine (ATARAX/VISTARIL) 50 MG tablet Take 50 mg by mouth 2 (two) times daily.   [name] levalbuterol (XOPENEX HFA) 45 MCG/ACT inhaler Inhale 1-2 puffs into the lungs every 6 (six) hours as needed for wheezing. Must keep appt w/new provider for future refills  . levalbuterol (XOPENEX) 0.63 MG/3ML nebulizer solution Take 3 mLs (0.63 mg total) by nebulization every 8 (eight) hours as needed for wheezing or shortness of breath.  [name] LORazepam (ATIVAN) 0.5 MG tablet Take 1 tablet (0.5 mg total) by mouth 2 (two) times daily as needed for anxiety.  . montelukast (SINGULAIR) 10 MG tablet Take 1 tablet (10 mg total) by mouth at bedtime.  . norethindrone-ethinyl estradiol (OVCON-50) 1-50 MG-MCG tablet Take 1 tablet by mouth daily.  . paliperidone (INVEGA) 6 MG 24 hr tablet Take 1 tablet (6 mg total) by mouth daily.   No facility-administered encounter medications on file as of 07/30/2017.     Allergies as of 07/30/2017 - Review Complete 07/30/2017  Allergen Reaction Noted  . Cogentin [benztropine]  08/12/2013    Past Medical History:  Diagnosis Date  . Allergy   . Asthma   . Borderline personality disorder ([place])   . Eczema   . Former smoker   . Urticaria     Past Surgical History:  Procedure Laterality Date  . Thumb surgery Left     Family History  Problem  Relation Age of Onset  . Healthy Mother   . Healthy Father   . Alzheimer's disease Maternal Grandmother   . Skin cancer Paternal Grandmother   . Allergic rhinitis Paternal Grandmother   . Asthma Paternal Grandmother   . Angioedema Neg Hx   . Eczema Neg Hx   . Urticaria Neg Hx     Social History   Socioeconomic History  . Marital status: Single    Spouse name: Not on file  . Number of children: 0  . Years of education: 58  . Highest education level: Not on file  Occupational  History  . Not on file  Social Needs  . Financial resource strain: Not on file  . Food insecurity:    Worry: Not on file    Inability: Not on file  . Transportation needs:    Medical: Not on file    Non-medical: Not on file  Tobacco Use  . Smoking status: Former Smoker    Packs/day: 0.50    Years: 9.00    Pack years: 4.50    Last attempt to quit: 12/04/2015    Years since quitting: 1.6  . Smokeless tobacco: Never Used  Substance and Sexual Activity  . Alcohol use: Yes    Alcohol/week: 4.8 oz    Types: 8 Cans of beer per week    Comment: occ  . Drug use: Yes    Frequency: 4.0 times per week    Types: Marijuana  . Sexual activity: Yes    Birth control/protection: Pill  Lifestyle  . Physical activity:    Days per week: Not on file    Minutes per session: Not on file  . Stress: Not on file  Relationships  . Social connections:    Talks on phone: Not on file    Gets together: Not on file    Attends religious service: Not on file    Active member of club or organization: Not on file    Attends meetings of clubs or organizations: Not on file    Relationship status: Not on file  . Intimate partner violence:    Fear of current or ex partner: Not on file    Emotionally abused: Not on file    Physically abused: Not on file    Forced sexual activity: Not on file  Other Topics Concern  . Not on file  Social History Narrative   Fun: Play video games and watch movies.   Denies abuse and feels safe at home.    Review of systems: Review of Systems  Constitutional: Negative for fever and chills.  HENT: Negative.   Eyes: Negative for blurred vision.  Respiratory: as per HPI  Cardiovascular: Negative for chest pain and palpitations.  Gastrointestinal: Negative for vomiting, diarrhea, blood per rectum. Genitourinary: Negative for dysuria, urgency, frequency and hematuria.  Musculoskeletal: Negative for myalgias, back pain and joint pain.  Skin: Negative for itching and rash.    Neurological: Negative for dizziness, tremors, focal weakness, seizures and loss of consciousness.  Endo/Heme/Allergies: Negative for environmental allergies.  Psychiatric/Behavioral: Negative for depression, suicidal ideas and hallucinations.  All other systems reviewed and are negative.  Physical Exam: Blood pressure 124/68, pulse 90, height 5\' 3"  (1.6 m), weight 231 lb (104.8 kg), SpO2 97 %. Gen:      No acute distress HEENT:  EOMI, sclera anicteric Neck:     No masses; no thyromegaly Lungs:    Clear to auscultation bilaterally; normal respiratory effort CV:  Regular rate and rhythm; no murmurs Abd:      + bowel sounds; soft, non-tender; no palpable masses, no distension Ext:    No edema; adequate peripheral perfusion Skin:      Warm and dry; no rash Neuro: alert and oriented x 3 Psych: normal mood and affect  Data Reviewed: FENO 01/14/17-14 FENO 04/19/17-11  CT chest 10/13/16-no pulmonary embolism, patchy bilateral groundglass opacity Chest x-ray 12/28/16- no acute cardiopulmonary abnormality I reviewed the images personally  PFTs 04/29/17 FVC 3.25 [94%], FEV1 2.43 [80%], F/F 72, TLC 101%, RV/TLC 129%, DLCO 96% Minimal obstruction with small airways disease, bronchodilator response, air-trapping.  CBC 01/14/17-WBC 10.2, eos 2.7%, absolute eosinophil count 300 Blood allergy profile 01/14/17- IgE 2242, RAST shows significant sensitivity to multiple allergens including cat, dog  Assessment:  Mild persistent asthma Continues on Symbicort.  Blood test reviewed which shows significant elevation in IgE and reactivity to multiple allergens including cat, dog.  She has a cat and dog at home and is not interested in getting rid of pets.  PFTs showed minimal obstructive changes with improvement in flow rates post bronchodilator.  Continue Symbicort, Singulair Follow-up with Dr. Ernst Bowler for desensitization therapy. Continue antihistamine and nasal  steroid  Plan/Recommendations: - Continue Symbicort, albuterol as needed  Marshell Garfinkel MD [facility] 07/30/2017, 1:36 PM  CC: Biagio Borg, MD

## 2017-07-30 NOTE — Progress Notes (Signed)
Inhaler training given. In-check peak flow #:80

## 2017-10-28 ENCOUNTER — Ambulatory Visit: Payer: 59 | Admitting: Family

## 2017-10-28 ENCOUNTER — Encounter: Payer: Self-pay | Admitting: Nurse Practitioner

## 2017-10-28 ENCOUNTER — Ambulatory Visit: Payer: 59 | Admitting: Nurse Practitioner

## 2017-10-28 ENCOUNTER — Ambulatory Visit (INDEPENDENT_AMBULATORY_CARE_PROVIDER_SITE_OTHER)
Admission: RE | Admit: 2017-10-28 | Discharge: 2017-10-28 | Disposition: A | Discharge status: Home / Self Care | Payer: 59 | Source: Ambulatory Visit | Attending: Nurse Practitioner | Admitting: Nurse Practitioner

## 2017-10-28 VITALS — BP 104/62 | HR 82 | Temp 98.3°F | Ht 63.0 in | Wt 230.0 lb

## 2017-10-28 DIAGNOSIS — R059 Cough, unspecified: Secondary | ICD-10-CM

## 2017-10-28 DIAGNOSIS — R05 Cough: Secondary | ICD-10-CM

## 2017-10-28 DIAGNOSIS — J40 Bronchitis, not specified as acute or chronic: Secondary | ICD-10-CM

## 2017-10-28 MED ORDER — FLUCONAZOLE 150 MG PO TABS: 150.0000 mg | ORAL_TABLET | Freq: Once | ORAL | 0 refills | Status: AC

## 2017-10-28 MED ORDER — DOXYCYCLINE HYCLATE 100 MG PO TABS: 100.0000 mg | ORAL_TABLET | Freq: Two times a day (BID) | ORAL | 0 refills | Status: DC

## 2017-10-28 NOTE — Patient Instructions (Signed)
Please take doxycycline as prescribed  I have sent diflucan for you if needed.  Please follow up for fevers over 101, if your symptoms get worse, or if your symptoms dont get better with the antibiotic.   Acute Bronchitis, Adult Acute bronchitis is when air tubes (bronchi) in the lungs suddenly get swollen. The condition can make it hard to breathe. It can also cause these symptoms:  A cough.  Coughing up clear, yellow, or green mucus.  Wheezing.  Chest congestion.  Shortness of breath.  A fever.  Body aches.  Chills.  A sore throat.  Follow these instructions at home: Medicines  Take over-the-counter and prescription medicines only as told by your doctor.  If you were prescribed an antibiotic medicine, take it as told by your doctor. Do not stop taking the antibiotic even if you start to feel better. General instructions  Rest.  Drink enough fluids to keep your pee (urine) clear or pale yellow.  Avoid smoking and secondhand smoke. If you smoke and you need help quitting, ask your doctor. Quitting will help your lungs heal faster.  Use an inhaler, cool mist vaporizer, or humidifier as told by your doctor.  Keep all follow-up visits as told by your doctor. This is important. How is this prevented? To lower your risk of getting this condition again:  Wash your hands often with soap and water. If you cannot use soap and water, use hand sanitizer.  Avoid contact with people who have cold symptoms.  Try not to touch your hands to your mouth, nose, or eyes.  Make sure to get the flu shot every year.  Contact a doctor if:  Your symptoms do not get better in 2 weeks. Get help right away if:  You cough up blood.  You have chest pain.  You have very bad shortness of breath.  You become dehydrated.  You faint (pass out) or keep feeling like you are going to pass out.  You keep throwing up (vomiting).  You have a very bad headache.  Your fever or chills  gets worse. This information is not intended to replace advice given to you by your health care provider. Make sure you discuss any questions you have with your health care provider. Document Released: 07/08/2007 Document Revised: 08/28/2015 Document Reviewed: 07/10/2015 Elsevier Interactive Patient Education  [employer].

## 2017-10-28 NOTE — Progress Notes (Signed)
Name: [name]   MRN: [id number]    DOB: [birth year]   Date:10/28/2017       Progress Note  Subjective  Chief Complaint  Chief Complaint  Patient presents with  . Sore Throat    x 2-3 weeks  . Hoarse    HPI [name] is here today for evaluation of an acute complaint of cough/cold symptoms, first began about 4 weeks ago, then felt better for a week, then over past 2-3 weeks worse again, fatigue, sore throat, hoarse, nasal and chest congestion, dry cough, feels like she has excess mucus in her throat she cant cough up. She denies fevers, chills, syncope, confusion, heartburn, acid reflux, chest pain, shortness of breath, wheezing, abdominal pain, nausea, vomiting. She has a history of asthma, allergies, bronchitis and pneumonia, is maintained on daily hydroxyzine, flonase, astelin, symbicort, albuerol prn, has not used her albuterol recently. She is not a tobacco smoker, does smoker marijuana sometimes  Patient Active Problem List   Diagnosis Date Noted  . Allergic rhinitis 03/12/2017  . Hyperglycemia 01/01/2017  . Allergy   . Former smoker   . Anxiety 10/26/2016  . Bronchopneumonia 10/13/2016  . Acute asthma exacerbation 10/13/2016  . Hypokalemia 10/13/2016  . Encounter for general adult medical examination with abnormal findings 04/17/2016  . Class 2 obesity due to excess calories without serious comorbidity with body mass index (BMI) of 37.0 to 37.9 in adult 04/17/2016  . Asthma 03/13/2016  . Borderline personality disorder ([place]) 03/13/2016  . Eczema 03/13/2016  . Cough 01/28/2016  . Antibiotic-associated diarrhea 01/28/2016  . Mood disorder ([place]) 03/04/2013    Social History   Tobacco Use  . Smoking status: Former Smoker    Packs/day: 0.50    Years: 9.00    Pack years: 4.50    Last attempt to quit: 12/04/2015    Years since quitting: 1.9  . Smokeless tobacco: Never Used  Substance Use Topics  . Alcohol use: Yes    Alcohol/week: 8.0 standard drinks     Types: 8 Cans of beer per week    Comment: occ     Current Outpatient Medications:  .  azelastine (ASTELIN) 0.1 % nasal spray, Use 2 sprays per nostril 1-2 times daily, Disp: 30 mL, Rfl: 3 .  budesonide-formoterol (SYMBICORT) 160-4.5 MCG/ACT inhaler, Inhale 2 puffs into the lungs 2 (two) times daily., Disp: 1 Inhaler, Rfl: 3 .  FLUoxetine (PROZAC) 40 MG capsule, Take 1 capsule (40 mg total) by mouth daily., Disp: 30 capsule, Rfl: 0 .  fluticasone (FLONASE) 50 MCG/ACT nasal spray, Place 2 sprays into both nostrils daily., Disp: 16 g, Rfl: 6 .  gabapentin (NEURONTIN) 300 MG capsule, Take 1 capsule (300 mg total) by mouth 3 (three) times daily., Disp: 90 capsule, Rfl: 2 .  hydrOXYzine (ATARAX/VISTARIL) 50 MG tablet, Take 50 mg by mouth 2 (two) times daily. , Disp: , Rfl:  .  levalbuterol (XOPENEX HFA) 45 MCG/ACT inhaler, Inhale 1-2 puffs into the lungs every 6 (six) hours as needed for wheezing. Must keep appt w/new provider for future refills, Disp: 1 Inhaler, Rfl: 11 .  levalbuterol (XOPENEX) 0.63 MG/3ML nebulizer solution, Take 3 mLs (0.63 mg total) by nebulization every 8 (eight) hours as needed for wheezing or shortness of breath., Disp: 90 mL, Rfl: 1 .  LORazepam (ATIVAN) 0.5 MG tablet, Take 1 tablet (0.5 mg total) by mouth 2 (two) times daily as needed for anxiety., Disp: 10 tablet, Rfl: 0 .  montelukast (SINGULAIR) 10 MG tablet,  Take 1 tablet (10 mg total) by mouth at bedtime., Disp: 30 tablet, Rfl: 11 .  norethindrone-ethinyl estradiol (OVCON-50) 1-50 MG-MCG tablet, Take 1 tablet by mouth daily., Disp: , Rfl:  .  paliperidone (INVEGA) 6 MG 24 hr tablet, Take 1 tablet (6 mg total) by mouth daily., Disp: 30 tablet, Rfl: 2  Allergies  Allergen Reactions  . Cogentin [Benztropine]     ROS  No other specific complaints in a complete review of systems (except as listed in HPI above).  Objective  Vitals:   10/28/17 1524  BP: 104/62  Pulse: 82  Temp: 98.3 F (36.8 C)  TempSrc: Oral   SpO2: 97%  Weight: 230 lb (104.3 kg)  Height: 5\' 3"  (1.6 m)    Body mass index is 40.74 kg/m.  Nursing Note and Vital Signs reviewed.  Physical Exam  Constitutional: Patient appears well-developed and well-nourished.  No distress.  HEENT: head atraumatic, normocephalic, pupils equal and reactive to light, EOM's intact, TM's without erythema or bulging, neck supple without lymphadenopathy, oropharynx pink and moist without exudate Cardiovascular: Normal rate, regular rhythm, S1/S2 present.  No murmur or rub heard. Distal pulses intact Pulmonary/Chest: Effort normal and breath sounds clear. No respiratory distress or retractions. Neurological: She is alert and oriented to person, place, and time. No cranial nerve deficit. Coordination, balance, strength, speech and gait are normal.  Skin: Skin is warm and dry. No rash noted. No erythema.  Psychiatric: Patient has a normal mood and affect. behavior is normal. Judgment and thought content normal.   Assessment & Plan  Bronchitis, Cough Due to duration and worsening of symptoms will update CXR and start antibiotic course  Doxycycline rx sent- dosing and side effects discussed Diflucan sent at her request due to often getting yeast infections when taking abx Home management, Red flags and when to present for emergency care or RTC including fever >101.62F, chest pain, shortness of breath, new/worsening/un-resolving symptoms, reviewed with patient  and provided in AVS. - doxycycline (VIBRA-TABS) 100 MG tablet; Take 1 tablet (100 mg total) by mouth 2 (two) times daily.  Dispense: 20 tablet; Refill: 0 - fluconazole (DIFLUCAN) 150 MG tablet; Take 1 tablet (150 mg total) by mouth once for 1 dose.  Dispense: 1 tablet; Refill: 0 - DG Chest 2 View; Future

## 2017-11-04 ENCOUNTER — Encounter: Payer: Self-pay | Admitting: Nurse Practitioner

## 2017-11-04 ENCOUNTER — Ambulatory Visit: Payer: 59 | Admitting: Nurse Practitioner

## 2017-11-04 VITALS — BP 110/76 | HR 82 | Temp 98.0°F | Ht 63.0 in | Wt 231.0 lb

## 2017-11-04 DIAGNOSIS — J40 Bronchitis, not specified as acute or chronic: Secondary | ICD-10-CM | POA: Diagnosis not present

## 2017-11-04 MED ORDER — METHYLPREDNISOLONE ACETATE 40 MG/ML IJ SUSP
40.0000 mg | Freq: Once | INTRAMUSCULAR | Status: AC
  Administered 2017-11-04: 40 mg via INTRAMUSCULAR

## 2017-11-04 NOTE — Progress Notes (Signed)
[name] is a 32 y.o. female with the following history as recorded in EpicCare:  Patient Active Problem List   Diagnosis Date Noted  . Allergic rhinitis 03/12/2017  . Hyperglycemia 01/01/2017  . Allergy   . Former smoker   . Anxiety 10/26/2016  . Bronchopneumonia 10/13/2016  . Acute asthma exacerbation 10/13/2016  . Hypokalemia 10/13/2016  . Encounter for general adult medical examination with abnormal findings 04/17/2016  . Class 2 obesity due to excess calories without serious comorbidity with body mass index (BMI) of 37.0 to 37.9 in adult 04/17/2016  . Asthma 03/13/2016  . Borderline personality disorder ([place]) 03/13/2016  . Eczema 03/13/2016  . Cough 01/28/2016  . Antibiotic-associated diarrhea 01/28/2016  . Mood disorder ([place]) 03/04/2013    Current Outpatient Medications  Medication Sig Dispense Refill  . azelastine (ASTELIN) 0.1 % nasal spray Use 2 sprays per nostril 1-2 times daily 30 mL 3  . budesonide-formoterol (SYMBICORT) 160-4.5 MCG/ACT inhaler Inhale 2 puffs into the lungs 2 (two) times daily. 1 Inhaler 3  . doxycycline (VIBRA-TABS) 100 MG tablet Take 1 tablet (100 mg total) by mouth 2 (two) times daily. 20 tablet 0  . FLUoxetine (PROZAC) 40 MG capsule Take 1 capsule (40 mg total) by mouth daily. 30 capsule 0  . fluticasone (FLONASE) 50 MCG/ACT nasal spray Place 2 sprays into both nostrils daily. 16 g 6  . gabapentin (NEURONTIN) 300 MG capsule Take 1 capsule (300 mg total) by mouth 3 (three) times daily. 90 capsule 2  . hydrOXYzine (ATARAX/VISTARIL) 50 MG tablet Take 50 mg by mouth 2 (two) times daily.     [name] levalbuterol (XOPENEX HFA) 45 MCG/ACT inhaler Inhale 1-2 puffs into the lungs every 6 (six) hours as needed for wheezing. Must keep appt w/new provider for future refills 1 Inhaler 11  . levalbuterol (XOPENEX) 0.63 MG/3ML nebulizer solution Take 3 mLs (0.63 mg total) by nebulization every 8 (eight) hours as needed for wheezing or shortness of breath. 90 mL 1   . LORazepam (ATIVAN) 0.5 MG tablet Take 1 tablet (0.5 mg total) by mouth 2 (two) times daily as needed for anxiety. 10 tablet 0  . montelukast (SINGULAIR) 10 MG tablet Take 1 tablet (10 mg total) by mouth at bedtime. 30 tablet 11  . norethindrone-ethinyl estradiol (OVCON-50) 1-50 MG-MCG tablet Take 1 tablet by mouth daily.    . paliperidone (INVEGA) 6 MG 24 hr tablet Take 1 tablet (6 mg total) by mouth daily. 30 tablet 2   No current facility-administered medications for this visit.     Allergies: Cogentin [benztropine]  Past Medical History:  Diagnosis Date  . Allergy   . Asthma   . Borderline personality disorder ([place])   . Eczema   . Former smoker   . Urticaria     Past Surgical History:  Procedure Laterality Date  . Thumb surgery Left     Family History  Problem Relation Age of Onset  . Healthy Mother   . Healthy Father   . Alzheimer's disease Maternal Grandmother   . Skin cancer Paternal Grandmother   . Allergic rhinitis Paternal Grandmother   . Asthma Paternal Grandmother   . Angioedema Neg Hx   . Eczema Neg Hx   . Urticaria Neg Hx     Social History   Tobacco Use  . Smoking status: Former Smoker    Packs/day: 0.50    Years: 9.00    Pack years: 4.50    Last attempt to quit: 12/04/2015  Years since quitting: 1.9  . Smokeless tobacco: Never Used  Substance Use Topics  . Alcohol use: Yes    Alcohol/week: 8.0 standard drinks    Types: 8 Cans of beer per week    Comment: occ    Subjective:  Ms Heron is here today for follow up of cough/cold symptoms, I first saw her for this complaint on 10/28/17 and she was started on doxycycline course. Chest xray done on 9/26 with following impression: no active cardiopulmonary disease. She returns today for follow up, says she has been taking doxycycline as instructed, overall feeling better and symptoms are improving but continues to have some sinus congestion, sore throat, cough.   Objective:  Vitals:   11/04/17 1325   BP: 110/76  Pulse: 82  Temp: 98 F (36.7 C)  TempSrc: Oral  SpO2: 97%  Weight: 231 lb (104.8 kg)  Height: 5\' 3"  (1.6 m)    General: Well developed, well nourished, in no acute distress  Skin : Warm and dry.  Head: Normocephalic and atraumatic  Eyes: Sclera and conjunctiva clear; pupils round and reactive to light; extraocular movements intact  Neck: Supple. Lungs: effort normal, Respirations unlabored. CVS exam: normal rate, regular rhythm, normal S1, S2, no murmurs, distal pulses intact. Extremities: No edema, cyanosis.  Neurologic: Alert and oriented; speech intact; face symmetrical; moves all extremities well; CNII-XII intact without focal deficit    Assessment:  1. Bronchitis      Plan:  Steroid injection today, she does not want oral steriods Complete doxycycline course as prescribed It looks like she has been seeing pulmonology regularly, I have instructed to schedule pulmolology follow up for further evaluation if symptoms don't improve/persist   No follow-ups on file.  No orders of the defined types were placed in this encounter.   Requested Prescriptions    No prescriptions requested or ordered in this encounter

## 2017-11-04 NOTE — Patient Instructions (Signed)
Please follow up with your pulmonologist if your symptoms get worse,  if your symptoms dont get better with the antibiotic, or if you are still having symptoms in 1 week.

## 2017-11-11 ENCOUNTER — Other Ambulatory Visit: Payer: Self-pay | Admitting: Family

## 2017-12-01 ENCOUNTER — Other Ambulatory Visit: Payer: Self-pay | Admitting: Allergy & Immunology

## 2017-12-11 ENCOUNTER — Other Ambulatory Visit: Payer: Self-pay | Admitting: Family

## 2017-12-31 ENCOUNTER — Other Ambulatory Visit: Payer: Self-pay | Admitting: Allergy & Immunology

## 2017-12-31 ENCOUNTER — Other Ambulatory Visit: Payer: Self-pay | Admitting: Internal Medicine

## 2018-01-04 ENCOUNTER — Ambulatory Visit: Payer: 59 | Admitting: Pulmonary Disease

## 2018-01-04 ENCOUNTER — Ambulatory Visit: Payer: Self-pay | Admitting: Pulmonary Disease

## 2018-01-07 ENCOUNTER — Telehealth: Payer: Self-pay | Admitting: Internal Medicine

## 2018-01-07 DIAGNOSIS — F603 Borderline personality disorder: Secondary | ICD-10-CM

## 2018-01-07 DIAGNOSIS — F4310 Post-traumatic stress disorder, unspecified: Secondary | ICD-10-CM

## 2018-01-07 DIAGNOSIS — F411 Generalized anxiety disorder: Secondary | ICD-10-CM

## 2018-01-07 DIAGNOSIS — F32A Depression, unspecified: Secondary | ICD-10-CM

## 2018-01-07 DIAGNOSIS — F329 Major depressive disorder, single episode, unspecified: Secondary | ICD-10-CM

## 2018-01-07 MED ORDER — PALIPERIDONE ER 6 MG PO TB24: 6.0000 mg | ORAL_TABLET | Freq: Every day | ORAL | 0 refills | Status: DC

## 2018-01-07 NOTE — Addendum Note (Signed)
Addended by: Biagio Borg on: 01/07/2018 05:06 PM   Modules accepted: Orders

## 2018-01-07 NOTE — Telephone Encounter (Signed)
Done erx 

## 2018-01-07 NOTE — Telephone Encounter (Addendum)
Copied from [place] [id number]. Topic: General - Other >> Jan 07, 2018  2:40 PM Lennox Solders wrote: Reason for CRM: pt is calling and needs one time courtesy med refill on paliperidone 6 mg extended release. Pt takes once a day. This med is prescribed by her psychiatrist and they may or may not refill. Pt has an appt with her psychiatrist on 01-11-18. [place]. Pt is out of med. Pt last appt was with [facility] on 11-04-17

## 2018-01-10 ENCOUNTER — Telehealth: Payer: Self-pay | Admitting: Allergy & Immunology

## 2018-01-10 ENCOUNTER — Other Ambulatory Visit: Payer: Self-pay | Admitting: *Deleted

## 2018-01-10 MED ORDER — AZELASTINE HCL 0.1 % NA SOLN: NASAL | 1 refills | Status: DC

## 2018-01-10 NOTE — Telephone Encounter (Signed)
I am a pushover. OK to send in two month supply.  Salvatore Marvel, MD [facility]

## 2018-01-10 NOTE — Telephone Encounter (Signed)
Pt mom called and made appointment for 02/24/2018 at 3.00pm . But she needs a refill on the astelin . [place]. [phone].

## 2018-01-10 NOTE — Telephone Encounter (Signed)
Please advise on refill for patient. Last refilled was 12/01/2017 for courtesy refill.

## 2018-01-10 NOTE — Telephone Encounter (Signed)
Prescription has been sent to the patient's pharmacy. 

## 2018-01-13 ENCOUNTER — Encounter: Payer: Self-pay | Admitting: Internal Medicine

## 2018-01-13 ENCOUNTER — Ambulatory Visit (INDEPENDENT_AMBULATORY_CARE_PROVIDER_SITE_OTHER): Payer: 59 | Admitting: Internal Medicine

## 2018-01-13 VITALS — BP 112/72 | HR 90 | Temp 97.9°F | Ht 63.0 in | Wt 236.0 lb

## 2018-01-13 DIAGNOSIS — R739 Hyperglycemia, unspecified: Secondary | ICD-10-CM

## 2018-01-13 DIAGNOSIS — T7840XD Allergy, unspecified, subsequent encounter: Secondary | ICD-10-CM

## 2018-01-13 DIAGNOSIS — J4520 Mild intermittent asthma, uncomplicated: Secondary | ICD-10-CM | POA: Diagnosis not present

## 2018-01-13 DIAGNOSIS — T7840XS Allergy, unspecified, sequela: Secondary | ICD-10-CM

## 2018-01-13 DIAGNOSIS — R21 Rash and other nonspecific skin eruption: Secondary | ICD-10-CM | POA: Diagnosis not present

## 2018-01-13 DIAGNOSIS — J45909 Unspecified asthma, uncomplicated: Secondary | ICD-10-CM | POA: Insufficient documentation

## 2018-01-13 LAB — POCT GLYCOSYLATED HEMOGLOBIN (HGB A1C): Hemoglobin A1C: 5.1 % (ref 4.0–5.6)

## 2018-01-13 MED ORDER — METHYLPREDNISOLONE 4 MG PO TBPK: ORAL_TABLET | ORAL | 0 refills | Status: DC

## 2018-01-13 MED ORDER — METHYLPREDNISOLONE ACETATE 80 MG/ML IJ SUSP
80.0000 mg | Freq: Once | INTRAMUSCULAR | Status: AC
  Administered 2018-01-13: 80 mg via INTRAMUSCULAR

## 2018-01-13 MED ORDER — FLUCONAZOLE 100 MG PO TABS: 100.0000 mg | ORAL_TABLET | Freq: Every day | ORAL | 0 refills | Status: AC

## 2018-01-13 MED ORDER — LEVALBUTEROL TARTRATE 45 MCG/ACT IN AERO: 1.0000 | INHALATION_SPRAY | Freq: Four times a day (QID) | RESPIRATORY_TRACT | 11 refills | Status: DC | PRN

## 2018-01-13 NOTE — Progress Notes (Addendum)
Subjective:    [name], female    DOB: Oct 24, 1985, 32 y.o.   MRN: [id number]  HPI  Here to f/u - Does have several wks ongoing nasal allergy symptoms with clearish congestion, itch and sneezing, without fever, pain, ST, cough, swelling but has also mild to mod wheezing today despite good compliance with meds for her allergic asthma.  To start allergy shots dec 30, has seen pulm then allergy.  Also has remarkably Itchy rash for several months to genital area but no vaginal d/c or swelling, mild, now worse and worse in the past few wks, just very uncomfortable, no prior hx.  Does have hx of eczema but this seems different.  Has same sexual  Partner for 10 yrs, denies rash such as herpes in past.   Pt denies polydipsia, polyuria,  Not pregnant Past Medical History:  Diagnosis Date  . Allergy   . Asthma   . Borderline personality disorder ([place])   . Eczema   . Former smoker   . Urticaria    Past Surgical History:  Procedure Laterality Date  . Thumb surgery Left     reports that she quit smoking about 2 years ago. She has a 4.50 pack-year smoking history. She has never used smokeless tobacco. She reports current alcohol use of about 8.0 standard drinks of alcohol per week. She reports current drug use. Frequency: 4.00 times per week. Drug: Marijuana. family history includes Allergic rhinitis in her paternal grandmother; Alzheimer's disease in her maternal grandmother; Asthma in her paternal grandmother; Healthy in her father and mother; Skin cancer in her paternal grandmother. Allergies  Allergen Reactions  . Cogentin [Benztropine]    Current Outpatient Medications on File Prior to Visit  Medication Sig Dispense Refill  . azelastine (ASTELIN) 0.1 % nasal spray USE 2 SPRAYS IN EACH NOSTRIL 1-2 TIMES A DAY. 30 mL 1  . FLUoxetine (PROZAC) 40 MG capsule Take 1 capsule (40 mg total) by mouth daily. 30 capsule 0  . fluticasone (FLONASE) 50 MCG/ACT nasal spray USE 2 SPRAYS IN  EACH NOSTRIL ONCE DAILY 16 g 0  . gabapentin (NEURONTIN) 300 MG capsule Take 1 capsule (300 mg total) by mouth 3 (three) times daily. 90 capsule 2  . hydrOXYzine (ATARAX/VISTARIL) 50 MG tablet Take 50 mg by mouth 2 (two) times daily.     [name] levalbuterol (XOPENEX HFA) 45 MCG/ACT inhaler Inhale 1-2 puffs into the lungs every 6 (six) hours as needed for wheezing. Must keep appt w/new provider for future refills 1 Inhaler 11  . levalbuterol (XOPENEX) 0.63 MG/3ML nebulizer solution Take 3 mLs (0.63 mg total) by nebulization every 8 (eight) hours as needed for wheezing or shortness of breath. 90 mL 1  . montelukast (SINGULAIR) 10 MG tablet Take 1 tablet (10 mg total) by mouth at bedtime. 30 tablet 11  . norethindrone-ethinyl estradiol (OVCON-50) 1-50 MG-MCG tablet Take 1 tablet by mouth daily.    . paliperidone (INVEGA) 6 MG 24 hr tablet Take 1 tablet (6 mg total) by mouth daily. 7 tablet 0  . SYMBICORT 160-4.5 MCG/ACT inhaler INHALE 2 PUFFS TWICE DAILY 10.2 g 2  . VENTOLIN HFA 108 (90 Base) MCG/ACT inhaler USE 2 PUFFS EVERY 6 HOURS AS NEEDED FOR WHEEZING OR SHORTNESS OF BREATH 18 g 0   No current facility-administered medications on file prior to visit.    Review of Systems  Constitutional: Negative for other unusual diaphoresis or sweats HENT: Negative for ear discharge or swelling Eyes: Negative  for other worsening visual disturbances Respiratory: Negative for stridor or other swelling  Gastrointestinal: Negative for worsening distension or other blood Genitourinary: Negative for retention or other urinary change Musculoskeletal: Negative for other MSK pain or swelling Skin: Negative for color change or other new lesions Neurological: Negative for worsening tremors and other numbness  Psychiatric/Behavioral: Negative for worsening agitation or other fatigue All other system neg per pt    Objective:   Physical Exam BP 112/72   Pulse 90   Temp 97.9 F (36.6 C) (Oral)   Ht 5\' 3"  (1.6 m)    Wt 236 lb (107 kg)   SpO2 97%   BMI 41.81 kg/m  VS noted, not ill appearing, obese Constitutional: Pt appears in NAD HENT: Head: NCAT.  Right Ear: External ear normal.  Left Ear: External ear normal.  Eyes: . Pupils are equal, round, and reactive to light. Conjunctivae and EOM are normal Bilat tm's with mild erythema.  Max sinus areas non tender.  Pharynx with mild erythema, no exudate  Nose: without d/c or deformity Neck: Neck supple. Gross normal ROM Cardiovascular: Normal rate and regular rhythm.   Pulmonary/Chest: Effort normal and breath sounds decreased without rales but with few trace wheezing.  Abd:  Soft, NT, ND, + BS, no organomegaly GU: diffuse faint to mild nontender erythema perigenital and perineal without ulcer or other rash Neurological: Pt is alert. At baseline orientation, motor grossly intact Skin: Skin is warm. No rashes, other new lesions, no LE edema Psychiatric: Pt behavior is normal without agitation, mild nervous  No other exam findings Lab Results  Component Value Date   WBC 10.2 01/14/2017   HGB 14.1 01/14/2017   HCT 42.2 01/14/2017   PLT 309.0 01/14/2017   GLUCOSE 99 01/01/2017   CHOL 185 04/17/2016   TRIG 99.0 04/17/2016   HDL 66.70 04/17/2016   LDLCALC 98 04/17/2016   ALT 11 04/17/2016   AST 15 04/17/2016   NA 137 01/01/2017   K 4.0 01/01/2017   CL 104 01/01/2017   CREATININE 0.76 01/01/2017   BUN 9 01/01/2017   CO2 24 01/01/2017   TSH 1.68 04/17/2016   HGBA1C 5.4 01/01/2017  POCT glycosylated hemoglobin (Hb A1C)  Order: [id number]  Status:  Final result Visible to patient:  No (Not Released) Dx:  Hyperglycemia   Ref Range & Units 14:13 78yr ago  Hemoglobin A1C 4.0 - 5.6 % 5.1  5.4 R, CM           Assessment & Plan:

## 2018-01-13 NOTE — Assessment & Plan Note (Signed)
To start allergy shots soon, also to improve with steroid tx

## 2018-01-13 NOTE — Assessment & Plan Note (Signed)
stable overall by history and exam, recent data reviewed with pt, and pt to continue medical treatment as before,  to f/u any worsening symptoms or concerns, for a1c today 

## 2018-01-13 NOTE — Assessment & Plan Note (Signed)
With mild exacerbation, for depomedrol 80 IM x 1, medrol dose pack asd, cont all home meds

## 2018-01-13 NOTE — Addendum Note (Signed)
Addended by: Juliet Rude on: 01/13/2018 02:18 PM   Modules accepted: Orders

## 2018-01-13 NOTE — Patient Instructions (Addendum)
Your A1c was OK today  You had the steroid shot today (depomedrol)  Please take all new medication as prescribed - the medrol pack, and the diflucan  Please continue all other medications as before, and refills have been done if requested.  Please have the pharmacy call with any other refills you may need.  Please continue your efforts at being more active, low cholesterol diet, and weight control.  Please keep your appointments with your specialists as you may have planned

## 2018-01-13 NOTE — Assessment & Plan Note (Signed)
C/w likely fungal, ok for diflucan asd,  to f/u any worsening symptoms or concerns

## 2018-01-16 DIAGNOSIS — J3089 Other allergic rhinitis: Secondary | ICD-10-CM

## 2018-01-17 ENCOUNTER — Telehealth: Payer: Self-pay

## 2018-01-17 DIAGNOSIS — J301 Allergic rhinitis due to pollen: Secondary | ICD-10-CM

## 2018-01-17 NOTE — Telephone Encounter (Signed)
jc spoke with the pt and she does not have any horse exposure.

## 2018-01-17 NOTE — Telephone Encounter (Signed)
Per Dr Ernst Bowler Can you call the patient to see if there is any horse exposure? I did not add it, but if she is around it routinely I can change the script. Thanks!  Lm for pt to call us back

## 2018-01-17 NOTE — Progress Notes (Signed)
We received notification from [name] that she is interested in pursuing allergen immunotherapy. Prescriptions written and routed to the Immunotherapy Team.   Salvatore Marvel, MD  Allergy and [facility]

## 2018-01-17 NOTE — Addendum Note (Signed)
Addended by: Valentina Shaggy on: 01/17/2018 11:59 AM   Modules accepted: Orders

## 2018-01-17 NOTE — Progress Notes (Signed)
VIALS EXP 01-18-19

## 2018-01-18 NOTE — Telephone Encounter (Signed)
Awesome.  She should be good to go then.  Salvatore Marvel, MD Allergy and [facility] [place]

## 2018-01-20 ENCOUNTER — Ambulatory Visit: Payer: Self-pay | Admitting: Internal Medicine

## 2018-01-27 ENCOUNTER — Ambulatory Visit: Payer: Self-pay

## 2018-01-27 NOTE — Telephone Encounter (Signed)
Returned call to patient who states that she has been treated for a Jock Itch by Dr Jenny Reichmann. She states that she took a fluconazole tablet and it did not help.  She was also taking prednisone for other issue. She states that she can not see her rash but it is on her inner thighs both legs and outer vaginal lips. It is between her buttock.  She states that the itching is severe.  She tries not to scratch but is unable to control it sometime.  She states the areas will bleed. She states that the skin is dry sometimes and if she scratches it will become wet feeling. She states she is really suffering with the itching. Appointment scheduled per protocol.  Care advice read to patient. Pt verbalized understanding of all instructions.  Reason for Disposition . Rash is painful to touch  Answer Assessment - Initial Assessment Questions 1. APPEARANCE of RASH: "Describe the rash."      Dry flacking skin 2. LOCATION: "Where is the rash located?"     Both inner thighs outer vaginal lips inner buttock 3. ONSET: "When did the rash start?"      73month  4. ITCHING: "Does the rash itch?" If so, ask: "How bad is the itch?"  (Scale 1-10; or mild, moderate, severe)     10 5. PAIN: "Does the rash hurt?" If so, ask: "How bad is the pain?"  (Scale 1-10; or mild, moderate, severe)     10 6. OTHER SYMPTOMS: "Do you have any other symptoms?" (e.g., fever)     no 7. PREGNANCY: "Is there any chance you are pregnant?" "When was your last menstrual period?"     No menses 3 weeks ago  Protocols used: JOCK ITCH-A-AH

## 2018-01-28 ENCOUNTER — Encounter: Payer: Self-pay | Admitting: Family

## 2018-01-28 ENCOUNTER — Ambulatory Visit: Payer: 59 | Admitting: Family

## 2018-01-28 VITALS — BP 104/82 | HR 107 | Temp 98.0°F | Ht 63.0 in | Wt 237.1 lb

## 2018-01-28 DIAGNOSIS — D179 Benign lipomatous neoplasm, unspecified: Secondary | ICD-10-CM | POA: Diagnosis not present

## 2018-01-28 DIAGNOSIS — L304 Erythema intertrigo: Secondary | ICD-10-CM

## 2018-01-28 MED ORDER — NYSTATIN-TRIAMCINOLONE 100000-0.1 UNIT/GM-% EX OINT: 1.0000 "application " | TOPICAL_OINTMENT | Freq: Two times a day (BID) | CUTANEOUS | 0 refills | Status: DC

## 2018-01-28 NOTE — Progress Notes (Signed)
[name] is a 32 y.o. female with the following history as recorded in EpicCare:  Patient Active Problem List   Diagnosis Date Noted  . Allergic asthma 01/13/2018  . Rash 01/13/2018  . Allergic rhinitis 03/12/2017  . Hyperglycemia 01/01/2017  . Allergy   . Former smoker   . Anxiety 10/26/2016  . Bronchopneumonia 10/13/2016  . Acute asthma exacerbation 10/13/2016  . Hypokalemia 10/13/2016  . Encounter for general adult medical examination with abnormal findings 04/17/2016  . Class 2 obesity due to excess calories without serious comorbidity with body mass index (BMI) of 37.0 to 37.9 in adult 04/17/2016  . Asthma 03/13/2016  . Borderline personality disorder ([place]) 03/13/2016  . Eczema 03/13/2016  . Cough 01/28/2016  . Antibiotic-associated diarrhea 01/28/2016  . Mood disorder ([place]) 03/04/2013    Current Outpatient Medications  Medication Sig Dispense Refill  . azelastine (ASTELIN) 0.1 % nasal spray USE 2 SPRAYS IN EACH NOSTRIL 1-2 TIMES A DAY. 30 mL 1  . FLUoxetine (PROZAC) 40 MG capsule Take 1 capsule (40 mg total) by mouth daily. 30 capsule 0  . fluticasone (FLONASE) 50 MCG/ACT nasal spray USE 2 SPRAYS IN EACH NOSTRIL ONCE DAILY 16 g 0  . gabapentin (NEURONTIN) 300 MG capsule Take 1 capsule (300 mg total) by mouth 3 (three) times daily. 90 capsule 2  . hydrOXYzine (ATARAX/VISTARIL) 50 MG tablet Take 50 mg by mouth 2 (two) times daily.     [name] levalbuterol (XOPENEX HFA) 45 MCG/ACT inhaler Inhale 1-2 puffs into the lungs every 6 (six) hours as needed for wheezing. 1 Inhaler 11  . levalbuterol (XOPENEX) 0.63 MG/3ML nebulizer solution Take 3 mLs (0.63 mg total) by nebulization every 8 (eight) hours as needed for wheezing or shortness of breath. 90 mL 1  . montelukast (SINGULAIR) 10 MG tablet Take 1 tablet (10 mg total) by mouth at bedtime. 30 tablet 11  . norethindrone-ethinyl estradiol (OVCON-50) 1-50 MG-MCG tablet Take 1 tablet by mouth daily.    . paliperidone (INVEGA) 6 MG 24  hr tablet Take 1 tablet (6 mg total) by mouth daily. 7 tablet 0  . SYMBICORT 160-4.5 MCG/ACT inhaler INHALE 2 PUFFS TWICE DAILY 10.2 g 2  . VENTOLIN HFA 108 (90 Base) MCG/ACT inhaler USE 2 PUFFS EVERY 6 HOURS AS NEEDED FOR WHEEZING OR SHORTNESS OF BREATH 18 g 0  . nystatin-triamcinolone ointment (MYCOLOG) Apply 1 application topically 2 (two) times daily. 60 g 0   No current facility-administered medications for this visit.     Allergies: Cogentin [benztropine]  Past Medical History:  Diagnosis Date  . Allergy   . Asthma   . Borderline personality disorder ([place])   . Eczema   . Former smoker   . Urticaria     Past Surgical History:  Procedure Laterality Date  . Thumb surgery Left     Family History  Problem Relation Age of Onset  . Healthy Mother   . Healthy Father   . Alzheimer's disease Maternal Grandmother   . Skin cancer Paternal Grandmother   . Allergic rhinitis Paternal Grandmother   . Asthma Paternal Grandmother   . Angioedema Neg Hx   . Eczema Neg Hx   . Urticaria Neg Hx     Social History   Tobacco Use  . Smoking status: Former Smoker    Packs/day: 0.50    Years: 9.00    Pack years: 4.50    Last attempt to quit: 12/04/2015    Years since quitting: 2.1  . Smokeless  tobacco: Never Used  Substance Use Topics  . Alcohol use: Yes    Alcohol/week: 8.0 standard drinks    Types: 8 Cans of beer per week    Comment: occ    Subjective:  Patient complaining skin rash since the end of October; was seen here 2 weeks ago and took 10 days of Diflucan/ Medrol Dose pak- did not notice any improvement; notes that skin is flaking/ peeling off; Denies any chest pain, shortness of breath, blurred vision or headache. Admits that she does sweat excessively/ has a hard time keeping her skin dry; had Hgba1c checked at last OV- normal/ not diabetic. Does have known eczema- uses "free and clear" detergents/ Dove sensitive skin;    LMP- now    Objective:  Vitals:   01/28/18 1354   BP: 104/82  Pulse: (!) 107  Temp: 98 F (36.7 C)  TempSrc: Oral  SpO2: 96%  Weight: 237 lb 1.3 oz (107.5 kg)  Height: 5\' 3"  (1.6 m)    General: Well developed, well nourished, in no acute distress  Skin : Warm and dry. Appear c/w fungus infection in vaginal area extending out to inner groin Head: Normocephalic and atraumatic  Lungs: Respirations unlabored;  Musculoskeletal: No deformities; no active joint inflammation  Extremities: No edema, cyanosis, clubbing; ? Lipoma outer lower right leg Vessels: Symmetric bilaterally  Neurologic: Alert and oriented; speech intact; face symmetrical; moves all extremities well; CNII-XII intact without focal deficit   Assessment:  1. Intertrigo   2. Lipoma, unspecified site     Plan:  1. Try treating with Mycolog ointment- apply bid to affected area; stressed need to keep area dry as much as possible; if symptoms persist, may need to see dermatology as she has now failed oral Diflucan and oral steroids; 2. Will update imaging; follow-up to be determined.   No follow-ups on file.  No orders of the defined types were placed in this encounter.   Requested Prescriptions   Signed Prescriptions Disp Refills  . nystatin-triamcinolone ointment (MYCOLOG) 60 g 0    Sig: Apply 1 application topically 2 (two) times daily.

## 2018-01-31 ENCOUNTER — Ambulatory Visit: Payer: 59

## 2018-02-01 ENCOUNTER — Ambulatory Visit (INDEPENDENT_AMBULATORY_CARE_PROVIDER_SITE_OTHER): Payer: 59 | Admitting: *Deleted

## 2018-02-01 DIAGNOSIS — J309 Allergic rhinitis, unspecified: Secondary | ICD-10-CM | POA: Diagnosis not present

## 2018-02-01 MED ORDER — EPINEPHRINE 0.3 MG/0.3ML IJ SOAJ: 0.3000 mg | Freq: Once | INTRAMUSCULAR | 1 refills | Status: AC

## 2018-02-01 NOTE — Progress Notes (Signed)
Immunotherapy   Patient Details  Name: [name] MRN: [id number] Date of Birth: July 22, 1985  02/01/2018  [name] started injections for  Grass-Weed-Tree-Cat-Dog & Mold-Dmite-Ragweed. Following schedule: B  Frequency:2 times per week Epi-Pen:Epi-Pen Available  Consent signed and patient instructions given. No problems after 30 minutes in the office.    Horris Latino 02/01/2018, 3:35 PM

## 2018-02-04 ENCOUNTER — Other Ambulatory Visit: Payer: Self-pay | Admitting: Internal Medicine

## 2018-02-04 ENCOUNTER — Other Ambulatory Visit: Payer: 59

## 2018-02-08 ENCOUNTER — Ambulatory Visit (INDEPENDENT_AMBULATORY_CARE_PROVIDER_SITE_OTHER): Payer: 59

## 2018-02-08 DIAGNOSIS — J309 Allergic rhinitis, unspecified: Secondary | ICD-10-CM | POA: Diagnosis not present

## 2018-02-10 ENCOUNTER — Other Ambulatory Visit: Payer: 59

## 2018-02-11 ENCOUNTER — Ambulatory Visit (INDEPENDENT_AMBULATORY_CARE_PROVIDER_SITE_OTHER): Payer: 59 | Admitting: *Deleted

## 2018-02-11 ENCOUNTER — Telehealth: Payer: Self-pay | Admitting: *Deleted

## 2018-02-11 DIAGNOSIS — J309 Allergic rhinitis, unspecified: Secondary | ICD-10-CM | POA: Diagnosis not present

## 2018-02-11 MED ORDER — BUDESONIDE-FORMOTEROL FUMARATE 160-4.5 MCG/ACT IN AERO: 2.0000 | INHALATION_SPRAY | Freq: Two times a day (BID) | RESPIRATORY_TRACT | 1 refills | Status: DC

## 2018-02-11 NOTE — Telephone Encounter (Signed)
Patient is scheduled already and is aware of the appointment date & time.

## 2018-02-11 NOTE — Telephone Encounter (Signed)
Patient walked in for an injection today, patient has a question for Dr Ernst Bowler. Patient would like to know if Dr Ernst Bowler can refill her Symbicort, patient states PCP will no longer fill it, patient only has a couple days left. Please advise [place]

## 2018-02-11 NOTE — Telephone Encounter (Signed)
I am happy to do that. I sent in the script to [facility]. She does need a follow up visit (last visit May 2019 with a follow up requested in three months). Therefore I only sent in one refill.   Beverly Marvel, MD [facility]

## 2018-02-11 NOTE — Addendum Note (Signed)
Addended by: Valentina Shaggy on: 02/11/2018 04:33 PM   Modules accepted: Orders

## 2018-02-15 ENCOUNTER — Ambulatory Visit (INDEPENDENT_AMBULATORY_CARE_PROVIDER_SITE_OTHER): Payer: 59

## 2018-02-15 DIAGNOSIS — J309 Allergic rhinitis, unspecified: Secondary | ICD-10-CM

## 2018-02-17 ENCOUNTER — Ambulatory Visit (INDEPENDENT_AMBULATORY_CARE_PROVIDER_SITE_OTHER): Payer: 59

## 2018-02-17 ENCOUNTER — Ambulatory Visit
Admission: RE | Admit: 2018-02-17 | Discharge: 2018-02-17 | Disposition: A | Discharge status: Home / Self Care | Payer: 59 | Source: Ambulatory Visit | Attending: Family | Admitting: Family

## 2018-02-17 DIAGNOSIS — J309 Allergic rhinitis, unspecified: Secondary | ICD-10-CM

## 2018-02-17 DIAGNOSIS — R2241 Localized swelling, mass and lump, right lower limb: Secondary | ICD-10-CM | POA: Diagnosis not present

## 2018-02-17 DIAGNOSIS — D179 Benign lipomatous neoplasm, unspecified: Secondary | ICD-10-CM

## 2018-02-18 ENCOUNTER — Telehealth: Payer: Self-pay | Admitting: Internal Medicine

## 2018-02-18 DIAGNOSIS — D1723 Benign lipomatous neoplasm of skin and subcutaneous tissue of right leg: Secondary | ICD-10-CM

## 2018-02-18 NOTE — Telephone Encounter (Signed)
Copied from [place] [id number]. Topic: General - Other >> Feb 18, 2018  4:41 PM Keene Breath wrote: Reason for CRM: Patient called to get the results of her ultra sound.  Please call patient back as soon as results are available.  CB# [phone]

## 2018-02-19 NOTE — Telephone Encounter (Signed)
Results already given by CMA and notated in [place]

## 2018-02-21 ENCOUNTER — Other Ambulatory Visit: Payer: Self-pay | Admitting: Family

## 2018-02-21 DIAGNOSIS — M7989 Other specified soft tissue disorders: Secondary | ICD-10-CM

## 2018-02-22 ENCOUNTER — Ambulatory Visit (INDEPENDENT_AMBULATORY_CARE_PROVIDER_SITE_OTHER): Payer: 59 | Admitting: *Deleted

## 2018-02-22 DIAGNOSIS — J309 Allergic rhinitis, unspecified: Secondary | ICD-10-CM

## 2018-02-23 ENCOUNTER — Other Ambulatory Visit: Payer: Self-pay | Admitting: Family

## 2018-02-23 DIAGNOSIS — R2241 Localized swelling, mass and lump, right lower limb: Secondary | ICD-10-CM

## 2018-02-24 ENCOUNTER — Encounter: Payer: Self-pay | Admitting: Allergy & Immunology

## 2018-02-24 ENCOUNTER — Ambulatory Visit: Payer: 59 | Admitting: Allergy & Immunology

## 2018-02-24 VITALS — BP 116/82 | HR 85 | Resp 18

## 2018-02-24 DIAGNOSIS — J302 Other seasonal allergic rhinitis: Secondary | ICD-10-CM

## 2018-02-24 DIAGNOSIS — J3089 Other allergic rhinitis: Secondary | ICD-10-CM

## 2018-02-24 DIAGNOSIS — J4540 Moderate persistent asthma, uncomplicated: Secondary | ICD-10-CM | POA: Diagnosis not present

## 2018-02-24 MED ORDER — ALBUTEROL SULFATE HFA 108 (90 BASE) MCG/ACT IN AERS: INHALATION_SPRAY | RESPIRATORY_TRACT | 1 refills | Status: DC

## 2018-02-24 MED ORDER — LEVALBUTEROL HCL 0.63 MG/3ML IN NEBU: 0.6300 mg | INHALATION_SOLUTION | Freq: Three times a day (TID) | RESPIRATORY_TRACT | 3 refills | Status: DC | PRN

## 2018-02-24 MED ORDER — MONTELUKAST SODIUM 10 MG PO TABS: 10.0000 mg | ORAL_TABLET | Freq: Every day | ORAL | 5 refills | Status: DC

## 2018-02-24 MED ORDER — AZELASTINE HCL 0.1 % NA SOLN: NASAL | 5 refills | Status: DC

## 2018-02-24 MED ORDER — FLUTICASONE PROPIONATE 50 MCG/ACT NA SUSP: 2.0000 | Freq: Every day | NASAL | 5 refills | Status: DC

## 2018-02-24 MED ORDER — BUDESONIDE-FORMOTEROL FUMARATE 160-4.5 MCG/ACT IN AERO: 2.0000 | INHALATION_SPRAY | Freq: Two times a day (BID) | RESPIRATORY_TRACT | 5 refills | Status: DC

## 2018-02-24 MED ORDER — HYDROXYZINE HCL 50 MG PO TABS: 50.0000 mg | ORAL_TABLET | Freq: Two times a day (BID) | ORAL | 5 refills | Status: DC

## 2018-02-24 NOTE — Progress Notes (Signed)
FOLLOW UP  Date of Service/Encounter:  02/24/18   Assessment:   Moderate persistent asthma, uncomplicated  Seasonal and perennial allergic rhinitis (dust mites, cats, dogs, cockroach, trees, weeds, grass, mouse, and horse) - on allergen immunotherapy  Hoarseness   Asthma Reportables:  Severity: moderate persistent  Risk: high Control: not well controlled  Plan/Recommendations:   1. Moderate persistent asthma, uncomplicated - Lung testing looked fairly good today.  - We will send in a refill on all of your asthma medications. - Daily controller medication(s): Singulair 10mg  daily and Symbicort 160/4.63mcg two puffs twice daily with spacer - Prior to physical activity: ProAir 2 puffs 10-15 minutes before physical activity. - Rescue medications: ProAir 4 puffs every 4-6 hours as needed - Asthma control goals:  * Full participation in all desired activities (may need albuterol before activity) * Albuterol use two time or less a week on average (not counting use with activity) * Cough interfering with sleep two time or less a month * Oral steroids no more than once a year * No hospitalizations  2. Seasonal and perennial allergic rhinitis (dust mites, cats, dogs, cockroach, trees, weeds, grass, mouse, and horse) - Testing today showed: a few more grasses, a few more trees, and horse - Continue with: Hydroxyzine one tablet twice daily, Singulair (montelukast) 10mg  daily and Flonase (fluticasone) two sprays per nostril daily - Start taking: Astelin (azelastine) 2 sprays per nostril 1-2 times daily as needed - You can use an extra dose of the antihistamine, if needed, for breakthrough symptoms.  - Continue with allergy shots at the same schedule.  - Continue with nasal saline rinses as needed.   4. Return in about 6 months (around 08/25/2018).   Subjective:   [name] is a 33 y.o. female presenting today for follow up of  Chief Complaint  Patient presents with  .  Follow-up    [name] has a history of the following: Patient Active Problem List   Diagnosis Date Noted  . Allergic asthma 01/13/2018  . Rash 01/13/2018  . Allergic rhinitis 03/12/2017  . Hyperglycemia 01/01/2017  . Allergy   . Former smoker   . Anxiety 10/26/2016  . Bronchopneumonia 10/13/2016  . Acute asthma exacerbation 10/13/2016  . Hypokalemia 10/13/2016  . Encounter for general adult medical examination with abnormal findings 04/17/2016  . Class 2 obesity due to excess calories without serious comorbidity with body mass index (BMI) of 37.0 to 37.9 in adult 04/17/2016  . Asthma 03/13/2016  . Borderline personality disorder ([place]) 03/13/2016  . Eczema 03/13/2016  . Cough 01/28/2016  . Antibiotic-associated diarrhea 01/28/2016  . Mood disorder ([place]) 03/04/2013    History obtained from: chart review and patient.  [name]'s Primary Care Provider is Biagio Borg, MD.     [name] is a 33 y.o. female presenting for a follow up visit.  She was last seen in May 2019.  At that time, she had testing that was positive to dust mites, cats, dogs, cockroach, trees, weeds, grass, mouse, and horse.  We recommended continuing with hydroxyzine 1 tablet twice daily, Singulair 10 mg daily, and Flonase 2 sprays per nostril daily.  We also added on Astelin 1 to 2 sprays per nostril up to twice daily.  Her lung testing looked great we continued all of her medications, per Dr. Vaughan Browner.  In the interim, she did make the decision to start allergen immunotherapy.  She received her first injection on February 01, 2018 has been receiving  injections up to twice weekly since then.  She remains in the [facility] and denies any problems with reactions whatsoever. She does have an up to date epinephrine autoinjector.   Since the last visit, she has mostly done well. However she felt that the medications were not doing the trick completely. She remains on her nasal sprays but she needs refills. She is  using the azelastine as needed since she has built up on her allergy shots. She reports that it was building up in her nose and causing some rawness. She has not needed antibiotics since the last visit.   She continues on her Symbicort two puffs BID. She has not seen Dr. Vaughan Browner "in a minute". This is her bad time of the year so she has been trying to use a couple of puffs of her rescue inhaler during the day and night to "keep everything working". She did have a nebulizer at home, but they moved and she lost it. She is still working on unpacking, which is likely resulting in some worsening symptoms. She has only been doing this BID albuterol for one week.   She does have hoarseness, which always seems to be an issue during this time of the year. She denies any history of GERD and any history of chest pain.   Otherwise, there have been no changes to her past medical history, surgical history, family history, or social history.    Review of Systems: a 14-point review of systems is pertinent for what is mentioned in HPI.  Otherwise, all other systems were negative.  Constitutional: negative other than that listed in the HPI Eyes: negative other than that listed in the HPI Ears, nose, mouth, throat, and face: negative other than that listed in the HPI Respiratory: negative other than that listed in the HPI Cardiovascular: negative other than that listed in the HPI Gastrointestinal: negative other than that listed in the HPI Genitourinary: negative other than that listed in the HPI Integument: negative other than that listed in the HPI Hematologic: negative other than that listed in the HPI Musculoskeletal: negative other than that listed in the HPI Neurological: negative other than that listed in the HPI Allergy/Immunologic: negative other than that listed in the HPI    Objective:   Blood pressure 116/82, pulse 85, resp. rate 18, SpO2 94 %. There is no height or weight on file to calculate  BMI.   Physical Exam:  General: Alert, interactive, in no acute distress. Pleasant female. Hoarse.  Eyes: No conjunctival injection bilaterally, no discharge on the right, no discharge on the left and no Horner-Trantas dots present. PERRL bilaterally. EOMI without pain. No photophobia.  Ears: Right TM pearly gray with normal light reflex, Left TM pearly gray with normal light reflex, Right TM intact without perforation and Left TM intact without perforation.  Nose/Throat: External nose within normal limits and septum midline. Turbinates edematous and pale with clear discharge. Posterior oropharynx erythematous without cobblestoning in the posterior oropharynx. Tonsils 2+ without exudates.  Tongue without thrush. Lungs: Clear to auscultation without wheezing, rhonchi or rales. No increased work of breathing. CV: Normal S1/S2. No murmurs. Capillary refill <2 seconds.  Skin: Warm and dry, without lesions or rashes. Neuro:   Grossly intact. No focal deficits appreciated. Responsive to questions.  Diagnostic studies:   Spirometry: results normal (FEV1: 2.07/67%, FVC: 2.69/74%, FEV1/FVC: 77%).    Spirometry consistent with possible restrictive disease. Overall values are similar to those obtained at her last visit.  Allergy Studies:  none      Salvatore Marvel, MD  [facility]

## 2018-02-24 NOTE — Patient Instructions (Addendum)
1. Moderate persistent asthma, uncomplicated - Lung testing looked fairly good today.  - We will send in a refill on all of your asthma medications. - Daily controller medication(s): Singulair 10mg  daily and Symbicort 160/4.4mcg two puffs twice daily with spacer - Prior to physical activity: ProAir 2 puffs 10-15 minutes before physical activity. - Rescue medications: ProAir 4 puffs every 4-6 hours as needed - Asthma control goals:  * Full participation in all desired activities (may need albuterol before activity) * Albuterol use two time or less a week on average (not counting use with activity) * Cough interfering with sleep two time or less a month * Oral steroids no more than once a year * No hospitalizations  2. Seasonal and perennial allergic rhinitis (dust mites, cats, dogs, cockroach, trees, weeds, grass, mouse, and horse) - Testing today showed: a few more grasses, a few more trees, and horse - Continue with: Hydroxyzine one tablet twice daily, Singulair (montelukast) 10mg  daily and Flonase (fluticasone) two sprays per nostril daily - Start taking: Astelin (azelastine) 2 sprays per nostril 1-2 times daily as needed - You can use an extra dose of the antihistamine, if needed, for breakthrough symptoms.  - Continue with allergy shots at the same schedule.  - Continue with nasal saline rinses as needed.   4. Return in about 6 months (around 08/25/2018).   Please inform us of any Emergency Department visits, hospitalizations, or changes in symptoms. Call us before going to the ED for breathing or allergy symptoms since we might be able to fit you in for a sick visit. Feel free to contact us anytime with any questions, problems, or concerns.  It was a pleasure to see you again today!  Websites that have reliable patient information: 1. American Academy of Asthma, Allergy, and Immunology: www.aaaai.org 2. Food Allergy Research and Education (FARE): foodallergy.org 3. Mothers of  Asthmatics: http://www.asthmacommunitynetwork.org 4. American College of Allergy, Asthma, and Immunology: MonthlyElectricBill.co.uk   Make sure you are registered to vote! If you have moved or changed any of your contact information, you will need to get this updated before voting!    Voter ID laws are going into effect for the General Election in November 2020! Be prepared! Check out http://levine.[email or web address] for more details.

## 2018-02-25 NOTE — Telephone Encounter (Signed)
Contacted pt at number provided.   Pt informed of referral and stated understanding.

## 2018-02-25 NOTE — Telephone Encounter (Signed)
Please let her know that insurance is not wanting to cover MRI; this is very common and when this happens we recommend surgical consult- that would just be an office visit and they can then decide what imaging is needed.

## 2018-02-28 ENCOUNTER — Ambulatory Visit: Payer: 59 | Admitting: Internal Medicine

## 2018-02-28 ENCOUNTER — Encounter: Payer: Self-pay | Admitting: Internal Medicine

## 2018-02-28 VITALS — BP 112/78 | HR 117 | Temp 97.6°F | Ht 63.0 in | Wt 235.0 lb

## 2018-02-28 DIAGNOSIS — R21 Rash and other nonspecific skin eruption: Secondary | ICD-10-CM

## 2018-02-28 DIAGNOSIS — R05 Cough: Secondary | ICD-10-CM

## 2018-02-28 DIAGNOSIS — J45901 Unspecified asthma with (acute) exacerbation: Secondary | ICD-10-CM | POA: Diagnosis not present

## 2018-02-28 DIAGNOSIS — R6889 Other general symptoms and signs: Secondary | ICD-10-CM | POA: Diagnosis not present

## 2018-02-28 DIAGNOSIS — R062 Wheezing: Secondary | ICD-10-CM

## 2018-02-28 DIAGNOSIS — R059 Cough, unspecified: Secondary | ICD-10-CM

## 2018-02-28 LAB — POCT INFLUENZA A/B
INFLUENZA A, POC: NEGATIVE
Influenza B, POC: NEGATIVE

## 2018-02-28 MED ORDER — METHYLPREDNISOLONE ACETATE 80 MG/ML IJ SUSP
80.0000 mg | Freq: Once | INTRAMUSCULAR | Status: AC
  Administered 2018-02-28: 80 mg via INTRAMUSCULAR

## 2018-02-28 MED ORDER — BENZONATATE 100 MG PO CAPS: 100.0000 mg | ORAL_CAPSULE | Freq: Three times a day (TID) | ORAL | 1 refills | Status: DC | PRN

## 2018-02-28 MED ORDER — NYSTATIN-TRIAMCINOLONE 100000-0.1 UNIT/GM-% EX OINT: 1.0000 "application " | TOPICAL_OINTMENT | Freq: Two times a day (BID) | CUTANEOUS | 0 refills | Status: DC

## 2018-02-28 MED ORDER — DOXYCYCLINE HYCLATE 100 MG PO TABS: 100.0000 mg | ORAL_TABLET | Freq: Two times a day (BID) | ORAL | 0 refills | Status: DC

## 2018-02-28 NOTE — Assessment & Plan Note (Signed)
Mild to mod, for refill mycolog asd,  to f/u any worsening symptoms or concerns

## 2018-02-28 NOTE — Assessment & Plan Note (Signed)
Mild to mod, for depomedrol IM 80,,  to f/u any worsening symptoms or concerns 

## 2018-02-28 NOTE — Progress Notes (Signed)
Subjective:    [name], female    DOB: 11/01/1985, 33 y.o.   MRN: [id number]  HPI     Here with 2-3 days acute onset fever, left facial pain, pressure, headache, general weakness and malaise, and greenish d/c, with mild ST and cough, but pt denies chest pain, wheezing, increased sob or doe, orthopnea, PND, increased LE swelling, palpitations, dizziness or syncope, except for onset mild wheezing last PM.  Pt denies new neurological symptoms such as new headache, or facial or extremity weakness or numbness   Pt denies polydipsia, polyuria.  ALso has a recurrent groin rash with itching improved with mycolog in the past, now worsening last 2 wks without swelling or drainage.   Past Medical History:  Diagnosis Date  . Allergy   . Asthma   . Borderline personality disorder ([place])   . Eczema   . Former smoker   . Urticaria    Past Surgical History:  Procedure Laterality Date  . Thumb surgery Left     reports that she quit smoking about 2 years ago. She has a 4.50 pack-year smoking history. She has never used smokeless tobacco. She reports current alcohol use of about 8.0 standard drinks of alcohol per week. She reports current drug use. Frequency: 4.00 times per week. Drug: Marijuana. family history includes Allergic rhinitis in her paternal grandmother; Alzheimer's disease in her maternal grandmother; Asthma in her paternal grandmother; Healthy in her father and mother; Skin cancer in her paternal grandmother. Allergies  Allergen Reactions  . Cogentin [Benztropine]    Current Outpatient Medications on File Prior to Visit  Medication Sig Dispense Refill  . albuterol (VENTOLIN HFA) 108 (90 Base) MCG/ACT inhaler USE 2 PUFFS EVERY 6 HOURS AS NEEDED FOR WHEEZING OR SHORTNESS OF BREATH 18 g 1  . azelastine (ASTELIN) 0.1 % nasal spray USE 2 SPRAYS IN EACH NOSTRIL 1-2 TIMES A DAY. 30 mL 5  . budesonide-formoterol (SYMBICORT) 160-4.5 MCG/ACT inhaler Inhale 2 puffs into the lungs 2  (two) times daily for 30 days. 1 Inhaler 5  . FLUoxetine (PROZAC) 40 MG capsule Take 1 capsule (40 mg total) by mouth daily. 30 capsule 0  . fluticasone (FLONASE) 50 MCG/ACT nasal spray Place 2 sprays into both nostrils daily. 16 g 5  . gabapentin (NEURONTIN) 300 MG capsule Take 1 capsule (300 mg total) by mouth 3 (three) times daily. 90 capsule 2  . hydrOXYzine (ATARAX/VISTARIL) 50 MG tablet Take 1 tablet (50 mg total) by mouth 2 (two) times daily. 30 tablet 5  . levalbuterol (XOPENEX HFA) 45 MCG/ACT inhaler Inhale 1-2 puffs into the lungs every 6 (six) hours as needed for wheezing. 1 Inhaler 11  . levalbuterol (XOPENEX) 0.63 MG/3ML nebulizer solution Take 3 mLs (0.63 mg total) by nebulization every 8 (eight) hours as needed for wheezing or shortness of breath. 90 mL 3  . montelukast (SINGULAIR) 10 MG tablet Take 1 tablet (10 mg total) by mouth at bedtime. 30 tablet 5  . norethindrone-ethinyl estradiol (OVCON-50) 1-50 MG-MCG tablet Take 1 tablet by mouth daily.    . paliperidone (INVEGA) 6 MG 24 hr tablet Take 1 tablet (6 mg total) by mouth daily. 7 tablet 0   No current facility-administered medications on file prior to visit.    Review of Systems  Constitutional: Negative for other unusual diaphoresis or sweats HENT: Negative for ear discharge or swelling Eyes: Negative for other worsening visual disturbances Respiratory: Negative for stridor or other swelling  Gastrointestinal: Negative for  worsening distension or other blood Genitourinary: Negative for retention or other urinary change Musculoskeletal: Negative for other MSK pain or swelling Skin: Negative for color change or other new lesions Neurological: Negative for worsening tremors and other numbness  Psychiatric/Behavioral: Negative for worsening agitation or other fatigue All other system neg per pt    Objective:   Physical Exam BP 112/78   Pulse (!) 117   Temp 97.6 F (36.4 C) (Oral)   Ht 5\' 3"  (1.6 m)   Wt 235 lb  (106.6 kg)   SpO2 98%   BMI 41.63 kg/m  VS noted, mild ill Constitutional: Pt appears in NAD HENT: Head: NCAT.  Right Ear: External ear normal.  Left Ear: External ear normal.  Bilat tm's with mild erythema.  Max sinus areas mild tender left > right maxillary  Pharynx with mild erythema, no exudate Eyes: . Pupils are equal, round, and reactive to light. Conjunctivae and EOM are normal Nose: without d/c or deformity Neck: Neck supple. Gross normal ROM Cardiovascular: Normal rate and regular rhythm.   Pulmonary/Chest: Effort normal and breath sounds without rales but with mild bilat wheezing.  Abd:  Soft, NT, ND, + BS, no organomegaly Neurological: Pt is alert. At baseline orientation, motor grossly intact Skin: Skin is warm. + groin rash erythema nontender, no ulcer swelling or mass, no other new lesions, no LE edema Psychiatric: Pt behavior is normal without agitation  No other exam findings  POCT Influenza A/B   Ref Range & Units 16:38 55yr ago  Influenza A, POC Negative Negative  PositiveAbnormal    Influenza B, POC Negative Negative  Negative            Assessment & Plan:

## 2018-02-28 NOTE — Patient Instructions (Addendum)
You had the steroid shot today  Please take all new medication as prescribed - the antibiotic, tessalon perle, and mycolog cream as needed  Please continue all other medications as before, and refills have been done if requested.  Please have the pharmacy call with any other refills you may need.  Please keep your appointments with your specialists as you may have planned

## 2018-02-28 NOTE — Assessment & Plan Note (Signed)
Mild to mod, c/w bronchitis vs pna, declines cxr, for antibx course, cough med prn,  to f/u any worsening symptoms or concerns 

## 2018-03-15 ENCOUNTER — Ambulatory Visit (INDEPENDENT_AMBULATORY_CARE_PROVIDER_SITE_OTHER): Payer: 59 | Admitting: *Deleted

## 2018-03-15 DIAGNOSIS — J309 Allergic rhinitis, unspecified: Secondary | ICD-10-CM

## 2018-03-17 ENCOUNTER — Ambulatory Visit (INDEPENDENT_AMBULATORY_CARE_PROVIDER_SITE_OTHER): Payer: 59 | Admitting: *Deleted

## 2018-03-17 DIAGNOSIS — J309 Allergic rhinitis, unspecified: Secondary | ICD-10-CM | POA: Diagnosis not present

## 2018-03-23 ENCOUNTER — Ambulatory Visit (INDEPENDENT_AMBULATORY_CARE_PROVIDER_SITE_OTHER): Payer: 59 | Admitting: *Deleted

## 2018-03-23 DIAGNOSIS — J309 Allergic rhinitis, unspecified: Secondary | ICD-10-CM

## 2018-03-24 DIAGNOSIS — I82811 Embolism and thrombosis of superficial veins of right lower extremities: Secondary | ICD-10-CM | POA: Diagnosis not present

## 2018-04-04 ENCOUNTER — Ambulatory Visit (INDEPENDENT_AMBULATORY_CARE_PROVIDER_SITE_OTHER): Payer: 59

## 2018-04-04 DIAGNOSIS — J309 Allergic rhinitis, unspecified: Secondary | ICD-10-CM

## 2018-04-08 ENCOUNTER — Ambulatory Visit (INDEPENDENT_AMBULATORY_CARE_PROVIDER_SITE_OTHER): Payer: 59 | Admitting: *Deleted

## 2018-04-08 ENCOUNTER — Ambulatory Visit: Payer: Self-pay | Admitting: *Deleted

## 2018-04-08 DIAGNOSIS — J309 Allergic rhinitis, unspecified: Secondary | ICD-10-CM

## 2018-04-13 ENCOUNTER — Ambulatory Visit (INDEPENDENT_AMBULATORY_CARE_PROVIDER_SITE_OTHER): Payer: 59 | Admitting: *Deleted

## 2018-04-13 DIAGNOSIS — J309 Allergic rhinitis, unspecified: Secondary | ICD-10-CM | POA: Diagnosis not present

## 2018-04-20 ENCOUNTER — Ambulatory Visit (INDEPENDENT_AMBULATORY_CARE_PROVIDER_SITE_OTHER): Payer: 59

## 2018-04-20 ENCOUNTER — Other Ambulatory Visit: Payer: Self-pay

## 2018-04-20 DIAGNOSIS — J309 Allergic rhinitis, unspecified: Secondary | ICD-10-CM

## 2018-05-04 ENCOUNTER — Telehealth: Payer: Self-pay | Admitting: Allergy & Immunology

## 2018-05-04 NOTE — Telephone Encounter (Signed)
Patient called and states she has contacted the pharmacy to request early refill on Montelukast, Symbicort and Flonase. Patient called office just to let us know why she requested early refill.  [employer].

## 2018-05-04 NOTE — Telephone Encounter (Signed)
Call to pharmacy, pt has refills left on all medications.
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
## 2018-05-04 NOTE — Telephone Encounter (Signed)
Call to pt to find out what medications needed to be refilled. No answer, left message.

## 2018-06-22 ENCOUNTER — Other Ambulatory Visit: Payer: Self-pay

## 2018-06-22 ENCOUNTER — Encounter: Payer: Self-pay | Admitting: Allergy & Immunology

## 2018-06-22 ENCOUNTER — Ambulatory Visit (INDEPENDENT_AMBULATORY_CARE_PROVIDER_SITE_OTHER): Payer: 59 | Admitting: Allergy & Immunology

## 2018-06-22 DIAGNOSIS — J302 Other seasonal allergic rhinitis: Secondary | ICD-10-CM | POA: Diagnosis not present

## 2018-06-22 DIAGNOSIS — J4540 Moderate persistent asthma, uncomplicated: Secondary | ICD-10-CM | POA: Diagnosis not present

## 2018-06-22 DIAGNOSIS — J3089 Other allergic rhinitis: Secondary | ICD-10-CM | POA: Diagnosis not present

## 2018-06-22 DIAGNOSIS — R490 Dysphonia: Secondary | ICD-10-CM | POA: Diagnosis not present

## 2018-06-22 MED ORDER — PREDNISONE 10 MG PO TABS: ORAL_TABLET | ORAL | 0 refills | Status: DC

## 2018-06-22 MED ORDER — HYDROXYZINE HCL 50 MG PO TABS: 50.0000 mg | ORAL_TABLET | Freq: Two times a day (BID) | ORAL | 5 refills | Status: DC

## 2018-06-22 MED ORDER — LEVALBUTEROL TARTRATE 45 MCG/ACT IN AERO: 1.0000 | INHALATION_SPRAY | Freq: Four times a day (QID) | RESPIRATORY_TRACT | 11 refills | Status: DC | PRN

## 2018-06-22 MED ORDER — OMEPRAZOLE 20 MG PO TBEC: 20.0000 mg | DELAYED_RELEASE_TABLET | Freq: Every day | ORAL | 5 refills | Status: DC

## 2018-06-22 MED ORDER — ALBUTEROL SULFATE HFA 108 (90 BASE) MCG/ACT IN AERS: INHALATION_SPRAY | RESPIRATORY_TRACT | 1 refills | Status: DC

## 2018-06-22 MED ORDER — FLUTICASONE PROPIONATE 50 MCG/ACT NA SUSP: 2.0000 | Freq: Every day | NASAL | 5 refills | Status: DC

## 2018-06-22 MED ORDER — BUDESONIDE-FORMOTEROL FUMARATE 160-4.5 MCG/ACT IN AERO: 2.0000 | INHALATION_SPRAY | Freq: Two times a day (BID) | RESPIRATORY_TRACT | 5 refills | Status: DC

## 2018-06-22 MED ORDER — LEVALBUTEROL HCL 0.63 MG/3ML IN NEBU: 0.6300 mg | INHALATION_SOLUTION | Freq: Three times a day (TID) | RESPIRATORY_TRACT | 3 refills | Status: DC | PRN

## 2018-06-22 MED ORDER — AZELASTINE HCL 0.1 % NA SOLN: NASAL | 5 refills | Status: DC

## 2018-06-22 MED ORDER — MONTELUKAST SODIUM 10 MG PO TABS: 10.0000 mg | ORAL_TABLET | Freq: Every day | ORAL | 5 refills | Status: DC

## 2018-06-22 NOTE — Patient Instructions (Addendum)
1. Moderate persistent asthma, uncomplicated - I will send in a prednisone burst.  - Start omeprazole 20mg  at night to help with possible reflux.  - Daily controller medication(s): Singulair 10mg  daily and Symbicort 160/4.57mcg two puffs twice daily with spacer - Prior to physical activity: ProAir 2 puffs 10-15 minutes before physical activity. - Rescue medications: ProAir 4 puffs every 4-6 hours as needed - Asthma control goals:  * Full participation in all desired activities (may need albuterol before activity) * Albuterol use two time or less a week on average (not counting use with activity) * Cough interfering with sleep two time or less a month * Oral steroids no more than once a year * No hospitalizations  2. Seasonal and perennial allergic rhinitis (dust mites, cats, dogs, cockroach, trees, weeds, grass, mouse, and horse) - Restart allergy injections.  - Continue with: Hydroxyzine one tablet twice daily, Singulair (montelukast) 10mg  daily, Flonase (fluticasone) two sprays per nostril daily and Astelin (azelastine) 2 sprays per nostril 1-2 times daily as needed - You can use an extra dose of the antihistamine, if needed, for breakthrough symptoms.  - Continue with nasal saline rinses as needed.   3. Return in about 3 months (around 09/22/2018). This can be an in-person, a virtual Webex or a telephone follow up visit.   Please inform us of any Emergency Department visits, hospitalizations, or changes in symptoms. Call us before going to the ED for breathing or allergy symptoms since we might be able to fit you in for a sick visit. Feel free to contact us anytime with any questions, problems, or concerns.  It was a pleasure to talk to you today today!  Websites that have reliable patient information: 1. American Academy of Asthma, Allergy, and Immunology: www.aaaai.org 2. Food Allergy Research and Education (FARE): foodallergy.org 3. Mothers of Asthmatics:  http://www.asthmacommunitynetwork.org 4. American College of Allergy, Asthma, and Immunology: www.acaai.org  "Like" [place] on Facebook and Instagram for our latest updates!      Make sure you are registered to vote! If you have moved or changed any of your contact information, you will need to get this updated before voting!    Voter ID laws are NOT going into effect for the General Election in November 2020! DO NOT let this stop you from exercising your right to vote!

## 2018-06-22 NOTE — Progress Notes (Signed)
RE: [name] MRN: [id number] DOB: Jan 15, 1986 Date of Telemedicine Visit: 06/22/2018  Referring provider: Biagio Borg, MD Primary care provider: Biagio Borg, MD  Chief Complaint: Asthma   Telemedicine Follow Up Visit via Telephone: I connected with Curtis Sites for a follow up on 06/23/18 by telephone and verified that I am speaking with the correct person using two identifiers.   I discussed the limitations, risks, security and privacy concerns of performing an evaluation and management service by telephone and the availability of in person appointments. I also discussed with the patient that there may be a patient responsible charge related to this service. The patient expressed understanding and agreed to proceed.  Patient is at home accompanied by her husband who provided/contributed to the history.  Provider is at the office.  Visit start time: 1:33 PM Visit end time: 1:53 PM Insurance consent/check in by: [employer] consent and medical assistant/nurse: Lovena Le  History of Present Illness:  She is a 33 y.o. female, who is being followed for persistent asthma as well as seasonal and perennial allergic rhinitis. Her previous allergy office visit was in January 2020 with Dr. Ernst Bowler.  At her last visit, her lung testing looked good.  We sent in refills on all of her medications.  We continued her on Symbicort 160/4.5 mcg 2 puffs twice daily with spacer as well as Singulair 10 mg daily.  For her allergic rhinitis, we continued hydroxyzine 1 tablet twice daily as well as Singulair and Flonase.  We added on Astelin as needed.  We recommended continuing with allergy shots at the same schedule and continuing with nasal saline rinses.  Since the last visit, she has mostly done well. She has had symptoms since the winter. This seems to be her worst time of the year. She has been using her Xopenex every other day. She has had no fever. The Xopenex does not help with the  hoarseness.   Asthma/Respiratory Symptom History: She remains on the Symbicort two puffs twice daily. She is on Singulair. She has not needed steroids since the last visit. ACT score today is 12, indicating poor asthma control.  She has been using her rescue inhaler more frequently.  She feels that while the hoarseness was improving when she was on the shots, she has had worsening hoarseness since stopping the shots.  Allergic Rhinitis Symptom History: She stopped her allergy shots because she was told that we were closed during the stay at home order. She remains on the hydroxyzine as well as the fluticasone. She has not needed antibiotics at all since the last visit.  She is very interested in restarting her allergen immunotherapy.  She typically does not get reflux very often. The hoarseness is bad all of the time. It has been worse since she stopped her allergy shots. She was actually told that we would not be open during the shut down. Symptoms typically get better in the spring time, but the hoarseness has continued.   Otherwise, there have been no changes to her past medical history, surgical history, family history, or social history.  Assessment and Plan:  [name] is a 33 y.o. female with:  Moderate persistent asthma, uncomplicated  Seasonal and perennial allergic rhinitis (dust mites, cats, dogs, cockroach, trees, weeds, grass, mouse, and horse) - on allergen immunotherapy  Hoarseness - likely reflux   Asthma Reportables:  Severity: moderate persistent  Risk: high Control: not well controlled   1. Moderate persistent asthma, uncomplicated - I will  send in a prednisone burst.  - Start omeprazole 20mg  at night to help with possible reflux.  - Daily controller medication(s): Singulair 10mg  daily and Symbicort 160/4.54mcg two puffs twice daily with spacer - Prior to physical activity: ProAir 2 puffs 10-15 minutes before physical activity. - Rescue medications: ProAir 4 puffs  every 4-6 hours as needed - Asthma control goals:  * Full participation in all desired activities (may need albuterol before activity) * Albuterol use two time or less a week on average (not counting use with activity) * Cough interfering with sleep two time or less a month * Oral steroids no more than once a year * No hospitalizations  2. Seasonal and perennial allergic rhinitis (dust mites, cats, dogs, cockroach, trees, weeds, grass, mouse, and horse) - Restart allergy injections.  - Continue with: Hydroxyzine one tablet twice daily, Singulair (montelukast) 10mg  daily, Flonase (fluticasone) two sprays per nostril daily and Astelin (azelastine) 2 sprays per nostril 1-2 times daily as needed - You can use an extra dose of the antihistamine, if needed, for breakthrough symptoms.  - Continue with nasal saline rinses as needed.   3. Return in about 3 months (around 09/22/2018). This can be an in-person, a virtual Webex or a telephone follow up visit.   Diagnostics: None.  Medication List:  Current Outpatient Medications  Medication Sig Dispense Refill  . albuterol (VENTOLIN HFA) 108 (90 Base) MCG/ACT inhaler USE 2 PUFFS EVERY 6 HOURS AS NEEDED FOR WHEEZING OR SHORTNESS OF BREATH 18 g 1  . azelastine (ASTELIN) 0.1 % nasal spray USE 2 SPRAYS IN EACH NOSTRIL 1-2 TIMES A DAY. 30 mL 5  . benzonatate (TESSALON PERLES) 100 MG capsule Take 1 capsule (100 mg total) by mouth 3 (three) times daily as needed for cough. 40 capsule 1  . doxycycline (VIBRA-TABS) 100 MG tablet Take 1 tablet (100 mg total) by mouth 2 (two) times daily. 20 tablet 0  . FLUoxetine (PROZAC) 40 MG capsule Take 1 capsule (40 mg total) by mouth daily. 30 capsule 0  . fluticasone (FLONASE) 50 MCG/ACT nasal spray Place 2 sprays into both nostrils daily. 16 g 5  . gabapentin (NEURONTIN) 300 MG capsule Take 1 capsule (300 mg total) by mouth 3 (three) times daily. 90 capsule 2  . hydrOXYzine (ATARAX/VISTARIL) 50 MG tablet Take 1 tablet  (50 mg total) by mouth 2 (two) times daily. 30 tablet 5  . levalbuterol (XOPENEX HFA) 45 MCG/ACT inhaler Inhale 1-2 puffs into the lungs every 6 (six) hours as needed for wheezing. 1 Inhaler 11  . levalbuterol (XOPENEX) 0.63 MG/3ML nebulizer solution Take 3 mLs (0.63 mg total) by nebulization every 8 (eight) hours as needed for wheezing or shortness of breath. 90 mL 3  . montelukast (SINGULAIR) 10 MG tablet Take 1 tablet (10 mg total) by mouth at bedtime. 30 tablet 5  . norethindrone-ethinyl estradiol (OVCON-50) 1-50 MG-MCG tablet Take 1 tablet by mouth daily.    [name] nystatin-triamcinolone ointment (MYCOLOG) Apply 1 application topically 2 (two) times daily. 30 g 0  . paliperidone (INVEGA) 6 MG 24 hr tablet Take 1 tablet (6 mg total) by mouth daily. 7 tablet 0  . budesonide-formoterol (SYMBICORT) 160-4.5 MCG/ACT inhaler Inhale 2 puffs into the lungs 2 (two) times daily for 30 days. 1 Inhaler 5  . Omeprazole 20 MG TBEC Take 1 tablet (20 mg total) by mouth at bedtime for 30 days. 30 each 5  . predniSONE (DELTASONE) 10 MG tablet Take 3 tabs (30mg ) twice daily for  3 days, then 2 tabs (20mg ) twice daily for 3 days, then 1 tab (10mg ) twice daily for 3 days, then STOP. 36 tablet 0   No current facility-administered medications for this visit.    Allergies: Allergies  Allergen Reactions  . Cogentin [Benztropine]    I reviewed her past medical history, social history, family history, and environmental history and no significant changes have been reported from previous visits.  Review of Systems  Constitutional: Negative for activity change, appetite change, chills, fatigue and fever.  HENT: Positive for sinus pain. Negative for congestion, postnasal drip, rhinorrhea, sinus pressure and sore throat.   Eyes: Negative for pain, discharge, redness and itching.  Respiratory: Positive for cough. Negative for shortness of breath, wheezing and stridor.        Hoarseness.  Gastrointestinal: Negative for  diarrhea, nausea and vomiting.  Musculoskeletal: Negative for arthralgias, joint swelling and myalgias.  Skin: Negative for rash.  Allergic/Immunologic: Negative for environmental allergies and food allergies.    Objective:  Physical exam not obtained as encounter was done via telephone.   Previous notes and tests were reviewed.  I discussed the assessment and treatment plan with the patient. The patient was provided an opportunity to ask questions and all were answered. The patient agreed with the plan and demonstrated an understanding of the instructions.   The patient was advised to call back or seek an in-person evaluation if the symptoms worsen or if the condition fails to improve as anticipated.  I provided 20 minutes of non-face-to-face time during this encounter.  It was my pleasure to participate in [place] care today. Please feel free to contact me with any questions or concerns.   Sincerely,  Valentina Shaggy, MD

## 2018-06-23 ENCOUNTER — Encounter: Payer: Self-pay | Admitting: Allergy & Immunology

## 2018-07-22 ENCOUNTER — Ambulatory Visit (INDEPENDENT_AMBULATORY_CARE_PROVIDER_SITE_OTHER): Payer: 59 | Admitting: Internal Medicine

## 2018-07-22 ENCOUNTER — Other Ambulatory Visit: Payer: Self-pay

## 2018-07-22 DIAGNOSIS — J4540 Moderate persistent asthma, uncomplicated: Secondary | ICD-10-CM | POA: Diagnosis not present

## 2018-07-22 DIAGNOSIS — E611 Iron deficiency: Secondary | ICD-10-CM

## 2018-07-22 DIAGNOSIS — E538 Deficiency of other specified B group vitamins: Secondary | ICD-10-CM

## 2018-07-22 DIAGNOSIS — E559 Vitamin D deficiency, unspecified: Secondary | ICD-10-CM

## 2018-07-22 DIAGNOSIS — Z0000 Encounter for general adult medical examination without abnormal findings: Secondary | ICD-10-CM

## 2018-07-22 DIAGNOSIS — R739 Hyperglycemia, unspecified: Secondary | ICD-10-CM | POA: Diagnosis not present

## 2018-07-22 DIAGNOSIS — R21 Rash and other nonspecific skin eruption: Secondary | ICD-10-CM | POA: Diagnosis not present

## 2018-07-22 MED ORDER — NYSTATIN-TRIAMCINOLONE 100000-0.1 UNIT/GM-% EX OINT: 1.0000 "application " | TOPICAL_OINTMENT | Freq: Two times a day (BID) | CUTANEOUS | 1 refills | Status: DC

## 2018-07-22 NOTE — Patient Instructions (Signed)
Please take all new medication as prescribed - the ointment  Please continue all other medications as before, and refills have been done if requested.  Please have the pharmacy call with any other refills you may need.  Please continue your efforts at being more active, low cholesterol diet, and weight control.  Please keep your appointments with your specialists as you may have planned  Please return in 3 months, or sooner if needed, with Lab testing done 3-5 days before

## 2018-07-23 ENCOUNTER — Encounter: Payer: Self-pay | Admitting: Internal Medicine

## 2018-07-23 NOTE — Assessment & Plan Note (Signed)
stable overall by history and exam, recent data reviewed with pt, and pt to continue medical treatment as before,  to f/u any worsening symptoms or concerns  

## 2018-07-23 NOTE — Progress Notes (Signed)
[name], female   DOB: 24-Jul-1985, 33 y.o.   MRN: [id number]  Virtual Visit via Video Note  I connected with [name] on 07/23/18 at 11:20 AM EDT by a video enabled telemedicine application and verified that I am speaking with the correct person using two identifiers.  Location: Patient: at home Provider: at office   I discussed the limitations of evaluation and management by telemedicine and the availability of in person appointments. The patient expressed understanding and agreed to proceed.  History of Present Illness: 33 yo F; Here to f/u; overall doing ok,  Pt denies chest pain, increasing sob or doe, wheezing, orthopnea, PND, increased LE swelling, palpitations, dizziness or syncope.  Pt denies new neurological symptoms such as new headache, or facial or extremity weakness or numbness.  Pt denies polydipsia, polyuria,.  Pt states overall good compliance with meds, mostly trying to follow appropriate diet, with wt overall stable,  but little exercise however. Remains obese and has recurrent difficulty with rash itchy and sometimes weepy for over 1 mo to the right groin area, without fever, ulcer.   Past Medical History:  Diagnosis Date  . Allergy   . Asthma   . Borderline personality disorder ([place])   . Eczema   . Former smoker   . Urticaria    Past Surgical History:  Procedure Laterality Date  . Thumb surgery Left     reports that she quit smoking about 2 years ago. She has a 4.50 pack-year smoking history. She has never used smokeless tobacco. She reports current alcohol use of about 8.0 standard drinks of alcohol per week. She reports current drug use. Frequency: 4.00 times per week. Drug: Marijuana. family history includes Allergic rhinitis in her paternal grandmother; Alzheimer's disease in her maternal grandmother; Asthma in her paternal grandmother; Healthy in her father and mother; Skin cancer in her paternal grandmother. Allergies   Allergen Reactions  . Cogentin [Benztropine]    Current Outpatient Medications on File Prior to Visit  Medication Sig Dispense Refill  . albuterol (VENTOLIN HFA) 108 (90 Base) MCG/ACT inhaler USE 2 PUFFS EVERY 6 HOURS AS NEEDED FOR WHEEZING OR SHORTNESS OF BREATH 18 g 1  . azelastine (ASTELIN) 0.1 % nasal spray USE 2 SPRAYS IN EACH NOSTRIL 1-2 TIMES A DAY. 30 mL 5  . benzonatate (TESSALON PERLES) 100 MG capsule Take 1 capsule (100 mg total) by mouth 3 (three) times daily as needed for cough. 40 capsule 1  . budesonide-formoterol (SYMBICORT) 160-4.5 MCG/ACT inhaler Inhale 2 puffs into the lungs 2 (two) times daily for 30 days. 1 Inhaler 5  . doxycycline (VIBRA-TABS) 100 MG tablet Take 1 tablet (100 mg total) by mouth 2 (two) times daily. 20 tablet 0  . FLUoxetine (PROZAC) 40 MG capsule Take 1 capsule (40 mg total) by mouth daily. 30 capsule 0  . fluticasone (FLONASE) 50 MCG/ACT nasal spray Place 2 sprays into both nostrils daily. 16 g 5  . gabapentin (NEURONTIN) 300 MG capsule Take 1 capsule (300 mg total) by mouth 3 (three) times daily. 90 capsule 2  . hydrOXYzine (ATARAX/VISTARIL) 50 MG tablet Take 1 tablet (50 mg total) by mouth 2 (two) times daily. 30 tablet 5  . levalbuterol (XOPENEX HFA) 45 MCG/ACT inhaler Inhale 1-2 puffs into the lungs every 6 (six) hours as needed for wheezing. 1 Inhaler 11  . levalbuterol (XOPENEX) 0.63 MG/3ML nebulizer solution Take 3 mLs (0.63 mg total) by nebulization every 8 (eight) hours as needed for wheezing  or shortness of breath. 90 mL 3  . montelukast (SINGULAIR) 10 MG tablet Take 1 tablet (10 mg total) by mouth at bedtime. 30 tablet 5  . norethindrone-ethinyl estradiol (OVCON-50) 1-50 MG-MCG tablet Take 1 tablet by mouth daily.    . Omeprazole 20 MG TBEC Take 1 tablet (20 mg total) by mouth at bedtime for 30 days. 30 each 5  . paliperidone (INVEGA) 6 MG 24 hr tablet Take 1 tablet (6 mg total) by mouth daily. 7 tablet 0  . predniSONE (DELTASONE) 10 MG tablet  Take 3 tabs (30mg ) twice daily for 3 days, then 2 tabs (20mg ) twice daily for 3 days, then 1 tab (10mg ) twice daily for 3 days, then STOP. 36 tablet 0   No current facility-administered medications on file prior to visit.     Observations/Objective: Alert, NAD, appropriate mood and affect, resps normal, cn 2-12 intact, moves all 4s, no visible swelling Past Medical History:  Diagnosis Date  . Allergy   . Asthma   . Borderline personality disorder ([place])   . Eczema   . Former smoker   . Urticaria    Past Surgical History:  Procedure Laterality Date  . Thumb surgery Left     reports that she quit smoking about 2 years ago. She has a 4.50 pack-year smoking history. She has never used smokeless tobacco. She reports current alcohol use of about 8.0 standard drinks of alcohol per week. She reports current drug use. Frequency: 4.00 times per week. Drug: Marijuana. family history includes Allergic rhinitis in her paternal grandmother; Alzheimer's disease in her maternal grandmother; Asthma in her paternal grandmother; Healthy in her father and mother; Skin cancer in her paternal grandmother. Allergies  Allergen Reactions  . Cogentin [Benztropine]    Current Outpatient Medications on File Prior to Visit  Medication Sig Dispense Refill  . albuterol (VENTOLIN HFA) 108 (90 Base) MCG/ACT inhaler USE 2 PUFFS EVERY 6 HOURS AS NEEDED FOR WHEEZING OR SHORTNESS OF BREATH 18 g 1  . azelastine (ASTELIN) 0.1 % nasal spray USE 2 SPRAYS IN EACH NOSTRIL 1-2 TIMES A DAY. 30 mL 5  . benzonatate (TESSALON PERLES) 100 MG capsule Take 1 capsule (100 mg total) by mouth 3 (three) times daily as needed for cough. 40 capsule 1  . budesonide-formoterol (SYMBICORT) 160-4.5 MCG/ACT inhaler Inhale 2 puffs into the lungs 2 (two) times daily for 30 days. 1 Inhaler 5  . doxycycline (VIBRA-TABS) 100 MG tablet Take 1 tablet (100 mg total) by mouth 2 (two) times daily. 20 tablet 0  . FLUoxetine (PROZAC) 40 MG capsule Take 1  capsule (40 mg total) by mouth daily. 30 capsule 0  . fluticasone (FLONASE) 50 MCG/ACT nasal spray Place 2 sprays into both nostrils daily. 16 g 5  . gabapentin (NEURONTIN) 300 MG capsule Take 1 capsule (300 mg total) by mouth 3 (three) times daily. 90 capsule 2  . hydrOXYzine (ATARAX/VISTARIL) 50 MG tablet Take 1 tablet (50 mg total) by mouth 2 (two) times daily. 30 tablet 5  . levalbuterol (XOPENEX HFA) 45 MCG/ACT inhaler Inhale 1-2 puffs into the lungs every 6 (six) hours as needed for wheezing. 1 Inhaler 11  . levalbuterol (XOPENEX) 0.63 MG/3ML nebulizer solution Take 3 mLs (0.63 mg total) by nebulization every 8 (eight) hours as needed for wheezing or shortness of breath. 90 mL 3  . montelukast (SINGULAIR) 10 MG tablet Take 1 tablet (10 mg total) by mouth at bedtime. 30 tablet 5  . norethindrone-ethinyl estradiol (OVCON-50) 1-50 MG-MCG  tablet Take 1 tablet by mouth daily.    . Omeprazole 20 MG TBEC Take 1 tablet (20 mg total) by mouth at bedtime for 30 days. 30 each 5  . paliperidone (INVEGA) 6 MG 24 hr tablet Take 1 tablet (6 mg total) by mouth daily. 7 tablet 0  . predniSONE (DELTASONE) 10 MG tablet Take 3 tabs (30mg ) twice daily for 3 days, then 2 tabs (20mg ) twice daily for 3 days, then 1 tab (10mg ) twice daily for 3 days, then STOP. 36 tablet 0   No current facility-administered medications on file prior to visit.    Assessment and Plan: See notes  Follow Up Instructions: See notes   I discussed the assessment and treatment plan with the patient. The patient was provided an opportunity to ask questions and all were answered. The patient agreed with the plan and demonstrated an understanding of the instructions.   The patient was advised to call back or seek an in-person evaluation if the symptoms worsen or if the condition fails to improve as anticipated   Cathlean Cower, MD

## 2018-07-23 NOTE — Assessment & Plan Note (Signed)
Mild to mod, for antifungal topical tx,  to f/u any worsening symptoms or concerns

## 2018-07-23 NOTE — Assessment & Plan Note (Signed)
stable overall by history and exam, recent data reviewed with pt, and pt to continue medical treatment as before,  to f/u any worsening symptoms or concerns, for a1c with labs 

## 2018-08-18 ENCOUNTER — Other Ambulatory Visit: Payer: Self-pay | Admitting: Internal Medicine

## 2018-08-26 ENCOUNTER — Other Ambulatory Visit: Payer: Self-pay | Admitting: Internal Medicine

## 2018-09-15 ENCOUNTER — Other Ambulatory Visit: Payer: Self-pay | Admitting: Internal Medicine

## 2018-09-22 ENCOUNTER — Ambulatory Visit (INDEPENDENT_AMBULATORY_CARE_PROVIDER_SITE_OTHER): Payer: 59 | Admitting: Allergy & Immunology

## 2018-09-22 ENCOUNTER — Other Ambulatory Visit: Payer: Self-pay

## 2018-09-22 ENCOUNTER — Encounter: Payer: Self-pay | Admitting: Allergy & Immunology

## 2018-09-22 DIAGNOSIS — J302 Other seasonal allergic rhinitis: Secondary | ICD-10-CM | POA: Diagnosis not present

## 2018-09-22 DIAGNOSIS — R490 Dysphonia: Secondary | ICD-10-CM | POA: Diagnosis not present

## 2018-09-22 DIAGNOSIS — J3089 Other allergic rhinitis: Secondary | ICD-10-CM | POA: Diagnosis not present

## 2018-09-22 DIAGNOSIS — J4540 Moderate persistent asthma, uncomplicated: Secondary | ICD-10-CM

## 2018-09-22 MED ORDER — LEVALBUTEROL HCL 0.63 MG/3ML IN NEBU: 0.6300 mg | INHALATION_SOLUTION | Freq: Three times a day (TID) | RESPIRATORY_TRACT | 3 refills | Status: DC | PRN

## 2018-09-22 MED ORDER — NYSTATIN-TRIAMCINOLONE 100000-0.1 UNIT/GM-% EX OINT: TOPICAL_OINTMENT | CUTANEOUS | 5 refills | Status: DC

## 2018-09-22 MED ORDER — BUDESONIDE-FORMOTEROL FUMARATE 160-4.5 MCG/ACT IN AERO: 2.0000 | INHALATION_SPRAY | Freq: Two times a day (BID) | RESPIRATORY_TRACT | 5 refills | Status: DC

## 2018-09-22 MED ORDER — MONTELUKAST SODIUM 10 MG PO TABS: 10.0000 mg | ORAL_TABLET | Freq: Every day | ORAL | 5 refills | Status: DC

## 2018-09-22 MED ORDER — ALBUTEROL SULFATE HFA 108 (90 BASE) MCG/ACT IN AERS: INHALATION_SPRAY | RESPIRATORY_TRACT | 1 refills | Status: DC

## 2018-09-22 MED ORDER — FLUTICASONE PROPIONATE 50 MCG/ACT NA SUSP: 2.0000 | Freq: Every day | NASAL | 5 refills | Status: DC

## 2018-09-22 MED ORDER — HYDROXYZINE HCL 50 MG PO TABS: 50.0000 mg | ORAL_TABLET | Freq: Two times a day (BID) | ORAL | 5 refills | Status: DC

## 2018-09-22 MED ORDER — OMEPRAZOLE 40 MG PO CPDR: 40.0000 mg | DELAYED_RELEASE_CAPSULE | Freq: Every day | ORAL | 5 refills | Status: DC

## 2018-09-22 NOTE — Patient Instructions (Signed)
1. Moderate persistent asthma, uncomplicated - Increase omeprazole to 40mg  at night to help with reflux.  - Get a spacer when you come in two restart injections. - Daily controller medication(s): Singulair 10mg  daily and Symbicort 160/4.17mcg two puffs twice daily with spacer - Prior to physical activity: ProAir 2 puffs 10-15 minutes before physical activity. - Rescue medications: ProAir 4 puffs every 4-6 hours as needed - Asthma control goals:  * Full participation in all desired activities (may need albuterol before activity) * Albuterol use two time or less a week on average (not counting use with activity) * Cough interfering with sleep two time or less a month * Oral steroids no more than once a year * No hospitalizations  2. Seasonal and perennial allergic rhinitis (dust mites, cats, dogs, cockroach, trees, weeds, grass, mouse, and horse) - Restart allergy injections.  - Continue with: Hydroxyzine one tablet twice daily, Singulair (montelukast) 10mg  daily, Flonase (fluticasone) two sprays per nostril daily and Astelin (azelastine) 2 sprays per nostril 1-2 times daily as needed - You can use an extra dose of the antihistamine, if needed, for breakthrough symptoms.  - Continue with nasal saline rinses as needed.   3. No follow-ups on file. This can be an in-person, a virtual Webex or a telephone follow up visit.   Please inform us of any Emergency Department visits, hospitalizations, or changes in symptoms. Call us before going to the ED for breathing or allergy symptoms since we might be able to fit you in for a sick visit. Feel free to contact us anytime with any questions, problems, or concerns.  It was a pleasure to talk to you today today!  Websites that have reliable patient information: 1. American Academy of Asthma, Allergy, and Immunology: www.aaaai.org 2. Food Allergy Research and Education (FARE): foodallergy.org 3. Mothers of Asthmatics:  http://www.asthmacommunitynetwork.org 4. American College of Allergy, Asthma, and Immunology: www.acaai.org  "Like" [place] on Facebook and Instagram for our latest updates!      Make sure you are registered to vote! If you have moved or changed any of your contact information, you will need to get this updated before voting!    Voter ID laws are NOT going into effect for the General Election in November 2020! DO NOT let this stop you from exercising your right to vote!

## 2018-09-22 NOTE — Progress Notes (Signed)
RE: [name] MRN: [id number] DOB: 01/08/1986 Date of Telemedicine Visit: 09/22/2018  Referring provider: Biagio Borg, MD Primary care provider: Biagio Borg, MD  Chief Complaint: Follow-up and Hoarse   Telemedicine Follow Up Visit via Telephone: I connected with [name] for a follow up on 09/22/18 by telephone and verified that I am speaking with the correct person using two identifiers.   I discussed the limitations, risks, security and privacy concerns of performing an evaluation and management service by telephone and the availability of in person appointments. I also discussed with the patient that there may be a patient responsible charge related to this service. The patient expressed understanding and agreed to proceed.   Patient is at home.  Provider is at the office.  Visit start time: 3:48 PM Visit end time: 4:03 PM Insurance consent/check in by: [facility] consent and medical assistant/nurse: Asheligh  History of Present Illness:  She is a 33 y.o. female, who is being followed for moderate persistent asthma as well as S/PAR. Her previous allergy office visit was in May 2020 with myself. At the last visit, we started omeprazole 20mg  at night to help with the hoarseness she wa2s experiencing. We continued with Singulair and Symbicort for her asthma with albuterol as needed. For her rhinitis, we contimed with hydroxyzine one tablet twice daily, fluticasone daily, and Astelin daily. We did discuss restarting allergen immunotherapy and she had plans to restart; however she did not restart them yet.   Since the last visit, she has not done well. She omeprazole 20mg  at night. This did work for a short period of time, but then around two weeks after the symptoms returned. She did not call us to discuss these symptoms, however. She denies any white spots in her mouth. She does report some sore throat. She does wash her mouth out after each time that she uses it.     Asthma/Respiratory Symptom History: She remains on her Symbicort two puffs twice daily, but she does not have a spacer. She has not had one in years but she would like one at all. Demiah's asthma has been well controlled. She has not required rescue medication, experienced nocturnal awakenings due to lower respiratory symptoms, nor have activities of daily living been limited. She has required no Emergency Department or Urgent Care visits for her asthma. She has required zero courses of systemic steroids for asthma exacerbations since the last visit. ACT score today is 18, indicating excellent asthma symptom control.  in.   Allergic Rhinitis Symptom History: She is using Ponaris emollients. She is wanting to restart her allergy injections. She is going to make an appointment to restart her allergy shots.  Otherwise, there have been no changes to her past medical history, surgical history, family history, or social history.  Assessment and Plan:  [name] is a 33 y.o. female with:  Moderate persistent asthma without complication  Seasonal and perennial allergic rhinitis  Hoarseness - ? GERD    1. Moderate persistent asthma, uncomplicated - Increase omeprazole to 40mg  at night to help with reflux.  - Get a spacer when you come in two restart injections. - Daily controller medication(s): Singulair 10mg  daily and Symbicort 160/4.53mcg two puffs twice daily with spacer - Prior to physical activity: ProAir 2 puffs 10-15 minutes before physical activity. - Rescue medications: ProAir 4 puffs every 4-6 hours as needed - Asthma control goals:  * Full participation in all desired activities (may need albuterol before activity) *  Albuterol use two time or less a week on average (not counting use with activity) * Cough interfering with sleep two time or less a month * Oral steroids no more than once a year * No hospitalizations  2. Seasonal and perennial allergic rhinitis (dust mites, cats, dogs,  cockroach, trees, weeds, grass, mouse, and horse) - Restart allergy injections.  - Continue with: Hydroxyzine one tablet twice daily, Singulair (montelukast) 10mg  daily, Flonase (fluticasone) two sprays per nostril daily and Astelin (azelastine) 2 sprays per nostril 1-2 times daily as needed - You can use an extra dose of the antihistamine, if needed, for breakthrough symptoms.  - Continue with nasal saline rinses as needed.   3. Follow up in six months or earlier if needed   Diagnostics: None.  Medication List:  Current Outpatient Medications  Medication Sig Dispense Refill  . albuterol (VENTOLIN HFA) 108 (90 Base) MCG/ACT inhaler USE 2 PUFFS EVERY 6 HOURS AS NEEDED FOR WHEEZING OR SHORTNESS OF BREATH 18 g 1  . FLUoxetine (PROZAC) 40 MG capsule Take 1 capsule (40 mg total) by mouth daily. 30 capsule 0  . fluticasone (FLONASE) 50 MCG/ACT nasal spray Place 2 sprays into both nostrils daily. 16 g 5  . gabapentin (NEURONTIN) 300 MG capsule Take 1 capsule (300 mg total) by mouth 3 (three) times daily. 90 capsule 2  . hydrOXYzine (ATARAX/VISTARIL) 50 MG tablet Take 1 tablet (50 mg total) by mouth 2 (two) times daily. 30 tablet 5  . levalbuterol (XOPENEX) 0.63 MG/3ML nebulizer solution Take 3 mLs (0.63 mg total) by nebulization every 8 (eight) hours as needed for wheezing or shortness of breath. 90 mL 3  . montelukast (SINGULAIR) 10 MG tablet Take 1 tablet (10 mg total) by mouth at bedtime. 30 tablet 5  . norethindrone-ethinyl estradiol (OVCON-50) 1-50 MG-MCG tablet Take 1 tablet by mouth daily.    [name] nystatin-triamcinolone ointment (MYCOLOG) APPLY TO AFFECTED AREA TWICE DAILY. 30 g 5  . paliperidone (INVEGA) 6 MG 24 hr tablet Take 1 tablet (6 mg total) by mouth daily. 7 tablet 0  . budesonide-formoterol (SYMBICORT) 160-4.5 MCG/ACT inhaler Inhale 2 puffs into the lungs 2 (two) times daily. 1 Inhaler 5  . omeprazole (PRILOSEC) 40 MG capsule Take 1 capsule (40 mg total) by mouth daily. 30 capsule 5   . Omeprazole 20 MG TBEC Take 1 tablet (20 mg total) by mouth at bedtime for 30 days. 30 each 5   No current facility-administered medications for this visit.    Allergies: Allergies  Allergen Reactions  . Cogentin [Benztropine]    I reviewed her past medical history, social history, family history, and environmental history and no significant changes have been reported from previous visits.  Review of Systems  Constitutional: Negative for activity change and appetite change.  HENT: Negative for congestion, postnasal drip, rhinorrhea, sinus pressure and sore throat.   Eyes: Negative for photophobia, pain, discharge, redness and itching.  Respiratory: Negative for shortness of breath, wheezing and stridor.   Gastrointestinal: Negative for diarrhea, nausea and vomiting.  Endocrine: Negative for cold intolerance and heat intolerance.  Musculoskeletal: Negative for arthralgias, joint swelling and myalgias.  Skin: Negative for rash.  Allergic/Immunologic: Negative for environmental allergies and food allergies.    Objective:  Physical exam not obtained as encounter was done via telephone.   Previous notes and tests were reviewed.  I discussed the assessment and treatment plan with the patient. The patient was provided an opportunity to ask questions and all were answered. The  patient agreed with the plan and demonstrated an understanding of the instructions.   The patient was advised to call back or seek an in-person evaluation if the symptoms worsen or if the condition fails to improve as anticipated.  I provided 15 minutes of non-face-to-face time during this encounter.  It was my pleasure to participate in [place] care today. Please feel free to contact me with any questions or concerns.   Sincerely,  Valentina Shaggy, MD

## 2018-10-11 ENCOUNTER — Ambulatory Visit: Payer: Self-pay

## 2018-10-11 ENCOUNTER — Telehealth: Payer: Self-pay

## 2018-10-11 MED ORDER — PREDNISONE 10 MG PO TABS: ORAL_TABLET | ORAL | 0 refills | Status: DC

## 2018-10-11 NOTE — Telephone Encounter (Signed)
Called and spoke with patient.  Let her know that script has been called in.  Pt okay with this, call ended.

## 2018-10-11 NOTE — Telephone Encounter (Signed)
Script sent. Please call patient to let her know.   She also called my work cell. I did not listen to the message yet.   Salvatore Marvel, MD [facility] [place] [facility] [place]

## 2018-10-11 NOTE — Telephone Encounter (Signed)
Patient called states she has been having flare up over the weekend. Patient was going to come in to get allergy shots but notice a change in her sx. She has been using her albuterol more frequently and was wanting to know if she could get a prednisone packet. Please advise.

## 2018-12-19 IMAGING — CR DG CHEST 2V
2 series · 2 of 2 positions shown · non-contrast
Comparison: 10/13/2016

CLINICAL DATA: Left-sided chest pain and shortness breath four
days. Asthma.

EXAM:
CHEST  2 VIEW

[w chest pa]
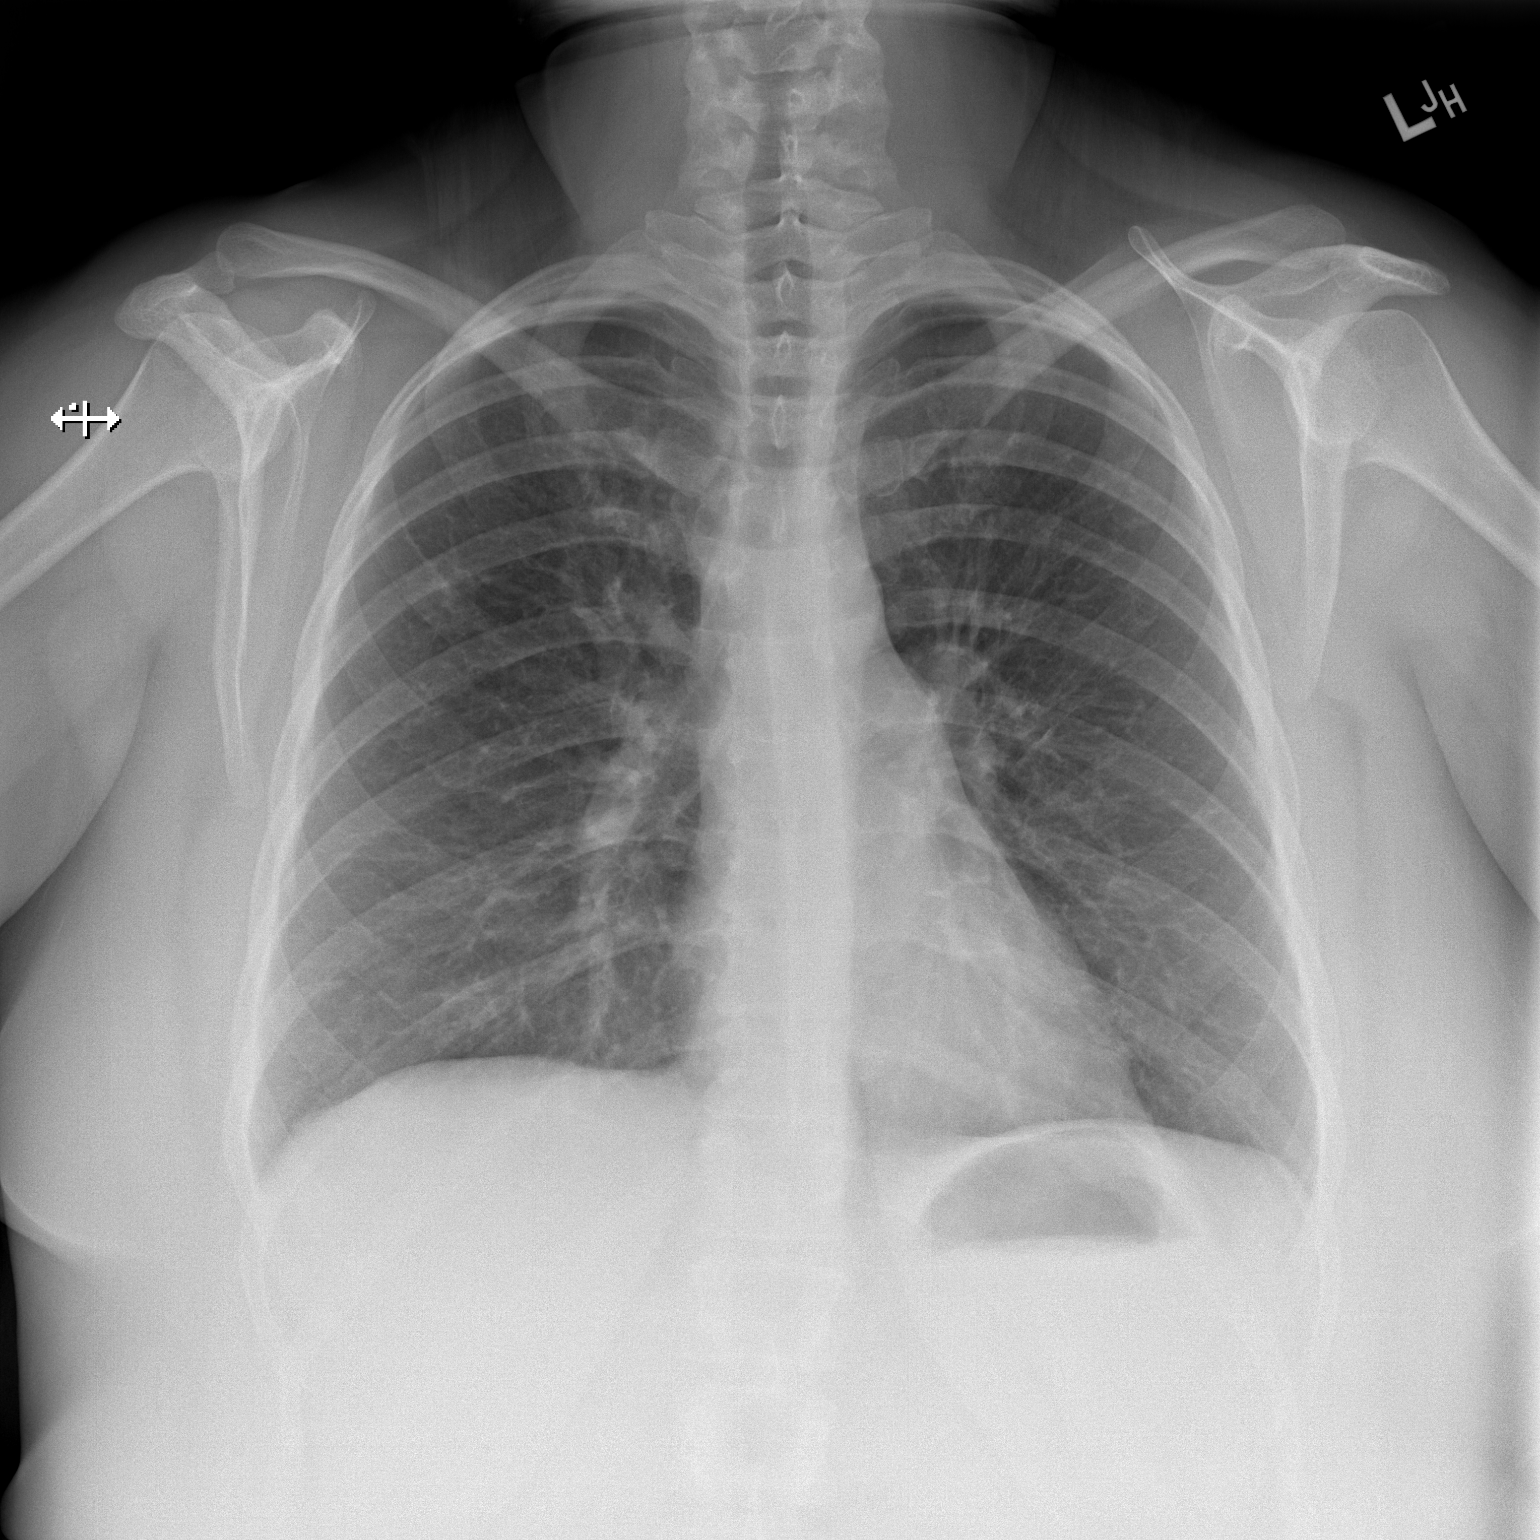

[w chest lat]
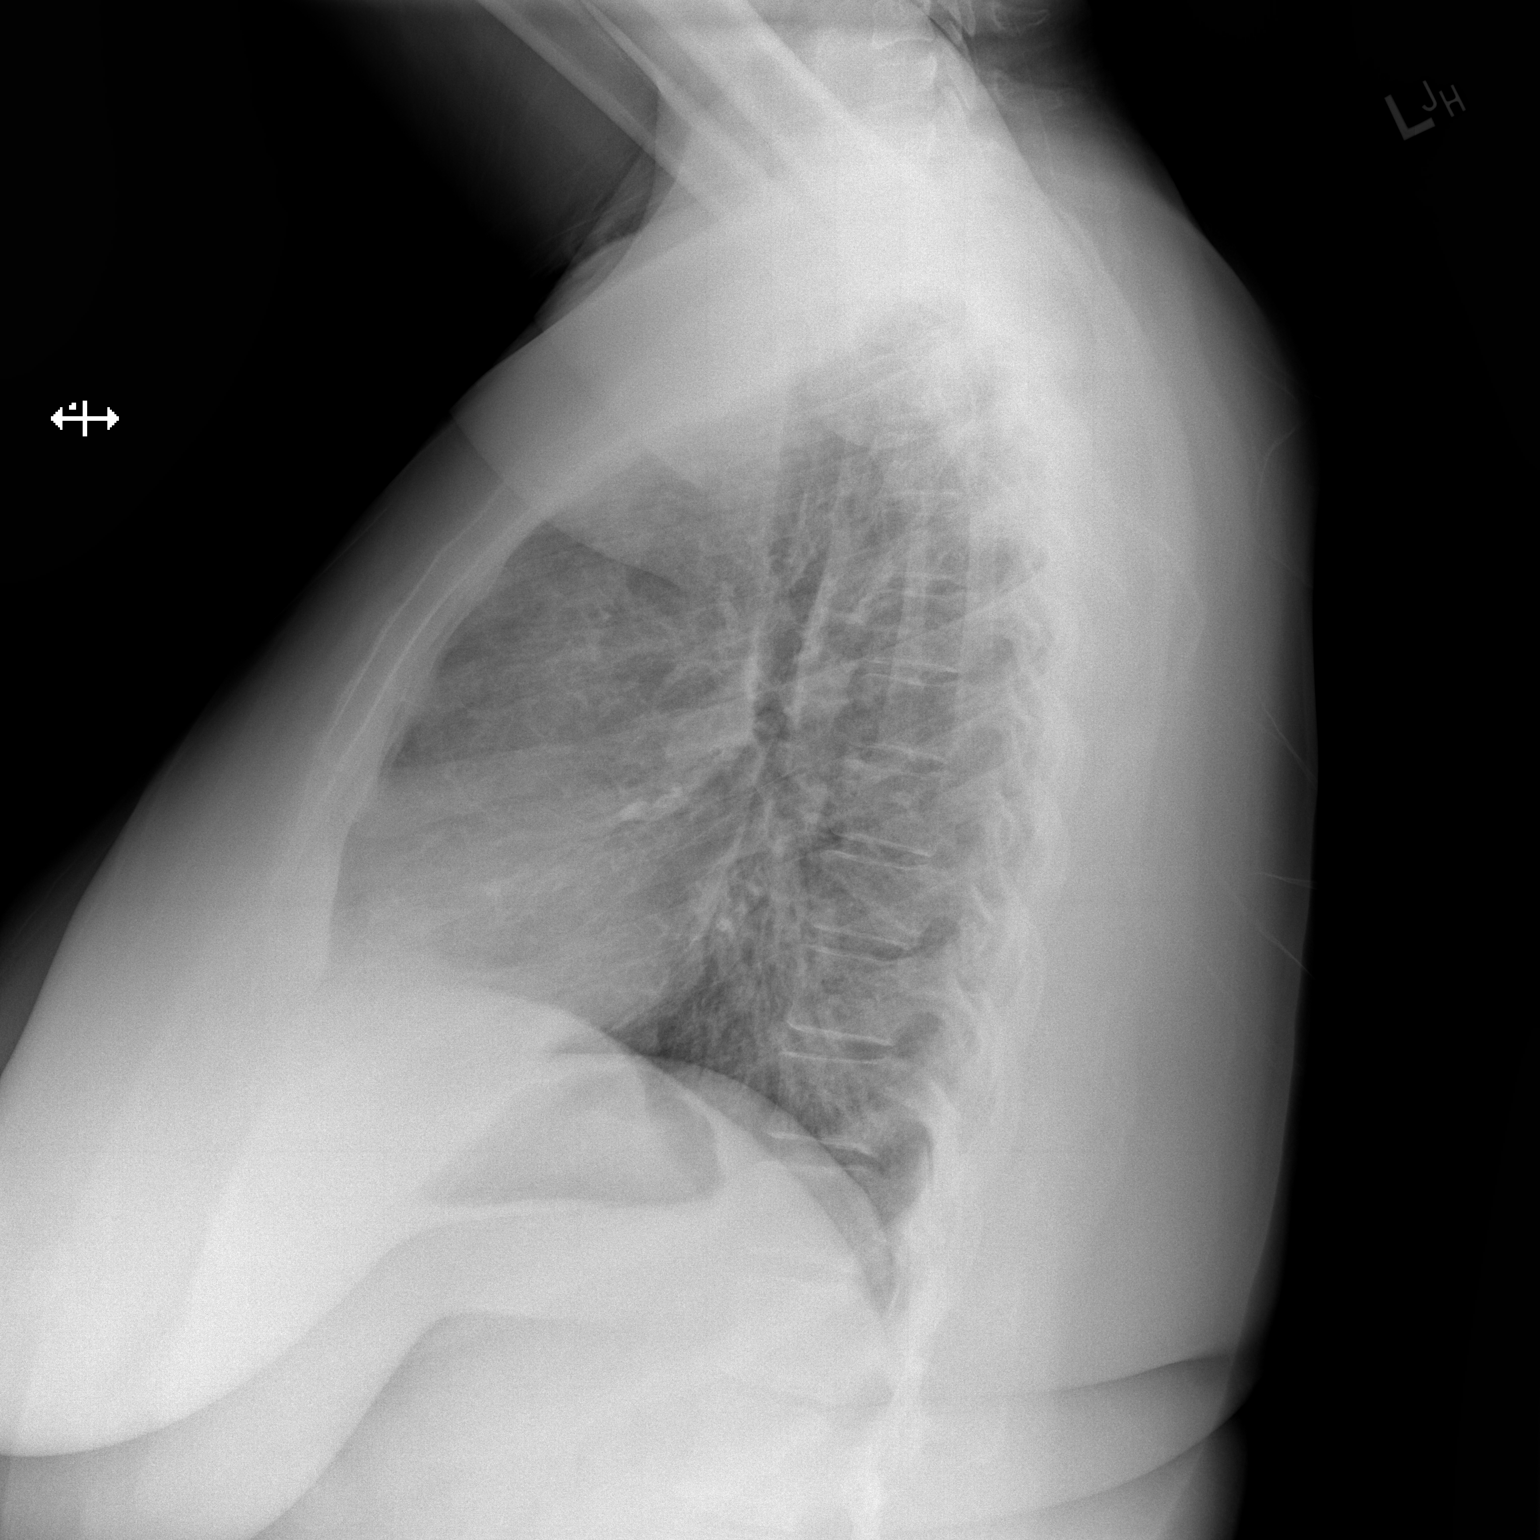

[2 of 2 positions shown; findings below may reference images not displayed]

FINDINGS: The heart size and mediastinal contours are within normal limits.
Both lungs are clear. No evidence of pneumothorax or pleural
effusion. The visualized skeletal structures are unremarkable.
IMPRESSION: Stable.  No active cardiopulmonary disease.

## 2019-02-09 ENCOUNTER — Ambulatory Visit (INDEPENDENT_AMBULATORY_CARE_PROVIDER_SITE_OTHER): Payer: 59 | Admitting: Allergy & Immunology

## 2019-02-09 ENCOUNTER — Encounter: Payer: Self-pay | Admitting: Allergy & Immunology

## 2019-02-09 ENCOUNTER — Other Ambulatory Visit: Payer: Self-pay

## 2019-02-09 DIAGNOSIS — J302 Other seasonal allergic rhinitis: Secondary | ICD-10-CM

## 2019-02-09 DIAGNOSIS — J0100 Acute maxillary sinusitis, unspecified: Secondary | ICD-10-CM | POA: Diagnosis not present

## 2019-02-09 DIAGNOSIS — J3089 Other allergic rhinitis: Secondary | ICD-10-CM | POA: Diagnosis not present

## 2019-02-09 DIAGNOSIS — J4540 Moderate persistent asthma, uncomplicated: Secondary | ICD-10-CM | POA: Diagnosis not present

## 2019-02-09 MED ORDER — AMOXICILLIN-POT CLAVULANATE 875-125 MG PO TABS: 1.0000 | ORAL_TABLET | Freq: Two times a day (BID) | ORAL | 0 refills | Status: AC

## 2019-02-09 MED ORDER — PREDNISONE 10 MG PO TABS: ORAL_TABLET | ORAL | 0 refills | Status: DC

## 2019-02-09 NOTE — Progress Notes (Signed)
RE: Eftihia Doubet MRN: [id number] DOB: [birth year] Date of Telemedicine Visit: 02/09/2019  Referring provider: Biagio Borg, MD Primary care provider: Biagio Borg, MD  Chief Complaint: Sinusitis (thick mucus, post nasal drainage, yellow-cloudy nasal drainage, sore throat, coughing, using inhaler more often and increased shortness of breath. some all-over soreness. )   Telemedicine Follow Up Visit via Telephone: I connected with Donnita Cardinale for a follow up on 02/09/19 by telephone and verified that I am speaking with the correct person using two identifiers.   I discussed the limitations, risks, security and privacy concerns of performing an evaluation and management service by telephone and the availability of in person appointments. I also discussed with the patient that there may be a patient responsible charge related to this service. The patient expressed understanding and agreed to proceed.  Patient is at home.  Provider is at the office.  Visit start time: 4:37 PM Visit end time: 4:56 PM Insurance consent/check in by: [facility] Medical consent and medical assistant/nurse: Lisabeth Pick  History of Present Illness:  She is a 34 y.o. female, who is being followed for persistent asthma as well as allergic rhinitis. Her previous allergy office visit was in August 2020 with myself.  She was last seen in August 2020 via a televisit.  At that time, we increased her omeprazole to 40 mg at night to help with reflux.  We did recommend getting a spacer to use with her Symbicort.  We continued hydroxyzine, Singulair, Flonase, and Astelin.  She was on allergy shots.  She stopped them when we first went into lockdown in March 2020.  She has not restarted them since that time, although she remains interested in it.  Since the last visit, she has mostly done well.  However, over the last week or 2, she has developed what feel like a sinus infection with sinus pressure and purulent discharge.   She was still feeling fairly good with a normal use of her daily allergy medications.  However, on Tuesday she was outside doing yard work for around 7 hours and over the course of the day with the cold air, she became progressively more winded.  That night, she started feeling weak and tired and has continued to feel that way since then.  She has not been around anyone at all and rarely goes out, only to get groceries.  She has not been COVID-19 tested.  The symptoms are not really flaring her asthma.  She is able to breathe without any problems.    Asthma/Respiratory Symptom History: She remains on Symbicort 2 puffs in the morning and 2 puffs at night.  She still does not have a spacer, as she was going to pick it up when she restarted her allergy shots after the last televisit.  She has not required any prednisone since last visit.  She is sleeping well at night without the need for albuterol.  Allergic Rhinitis Symptom History: She remains on her Flonase as well as Singulair and hydroxyzine.  She is using Astelin as needed.  She has not needed antibiotics in quite some time.  I did look it up in her system and the last time she got antibiotics was in 2018.  Otherwise, there have been no changes to her past medical history, surgical history, family history, or social history.  Assessment and Plan:  Nathifa is a 34 y.o. female with:  Moderate persistent asthma without complication  Seasonal and perennial allergic rhinitis (dust mites, cats, dogs,  cockroach, trees, weeds, grass, mouse, and horse) - on allergen immunotherapy  Acute non-recurrent maxillary sinusitis   1. Moderate persistent asthma, uncomplicated - Continue with omeprazole 40mg  daily. - Daily controller medication(s): Singulair 10mg  daily and Symbicort 160/4.14mcg two puffs twice daily with spacer - Prior to physical activity: ProAir 2 puffs 10-15 minutes before physical activity. - Rescue medications: ProAir 4 puffs every 4-6  hours as needed - Asthma control goals:  * Full participation in all desired activities (may need albuterol before activity) * Albuterol use two time or less a week on average (not counting use with activity) * Cough interfering with sleep two time or less a month * Oral steroids no more than once a year * No hospitalizations  2. Seasonal and perennial allergic rhinitis (dust mites, cats, dogs, cockroach, trees, weeds, grass, mouse, and horse) - Restart allergy injections.   - They are out of date, so you will have to definitely call to let us know you want to restart them.  - Continue with: Hydroxyzine one tablet twice daily, Singulair (montelukast) 10mg  daily, Flonase (fluticasone) two sprays per nostril daily and Astelin (azelastine) 2 sprays per nostril 1-2 times daily as needed - You can use an extra dose of the antihistamine, if needed, for breakthrough symptoms.  - Continue with nasal saline rinses as needed.   3. Sinusitis - Start Augmentin twice daily for ten days. - Start the prednisone burst provided.  4. Return in about 6 months (around 08/09/2019). This can be an in-person, a virtual Webex or a telephone follow up visit.   Diagnostics: None.  Medication List:  Current Outpatient Medications  Medication Sig Dispense Refill  . albuterol (VENTOLIN HFA) 108 (90 Base) MCG/ACT inhaler USE 2 PUFFS EVERY 6 HOURS AS NEEDED FOR WHEEZING OR SHORTNESS OF BREATH 18 g 1  . budesonide-formoterol (SYMBICORT) 160-4.5 MCG/ACT inhaler Inhale 2 puffs into the lungs 2 (two) times daily. 1 Inhaler 5  . FLUoxetine (PROZAC) 40 MG capsule Take 1 capsule (40 mg total) by mouth daily. 30 capsule 0  . fluticasone (FLONASE) 50 MCG/ACT nasal spray Place 2 sprays into both nostrils daily. 16 g 5  . gabapentin (NEURONTIN) 300 MG capsule Take 1 capsule (300 mg total) by mouth 3 (three) times daily. 90 capsule 2  . hydrOXYzine (ATARAX/VISTARIL) 50 MG tablet Take 1 tablet (50 mg total) by mouth 2 (two)  times daily. (Patient taking differently: Take by mouth. 1 tablet in the morning. 2 tablets at night) 30 tablet 5  . montelukast (SINGULAIR) 10 MG tablet Take 1 tablet (10 mg total) by mouth at bedtime. 30 tablet 5  . norethindrone-ethinyl estradiol (OVCON-50) 1-50 MG-MCG tablet Take 1 tablet by mouth daily.    [name] nystatin-triamcinolone ointment (MYCOLOG) APPLY TO AFFECTED AREA TWICE DAILY. 30 g 5  . omeprazole (PRILOSEC) 40 MG capsule Take 1 capsule (40 mg total) by mouth daily. 30 capsule 5  . paliperidone (INVEGA) 6 MG 24 hr tablet Take 1 tablet (6 mg total) by mouth daily. 7 tablet 0  . levalbuterol (XOPENEX) 0.63 MG/3ML nebulizer solution Take 3 mLs (0.63 mg total) by nebulization every 8 (eight) hours as needed for wheezing or shortness of breath. (Patient not taking: Reported on 02/09/2019) 90 mL 3  . Omeprazole 20 MG TBEC Take 1 tablet (20 mg total) by mouth at bedtime for 30 days. 30 each 5   No current facility-administered medications for this visit.   Allergies: Allergies  Allergen Reactions  . Cogentin [Benztropine]   .  Other     Opiates- history of dependency    I reviewed her past medical history, social history, family history, and environmental history and no significant changes have been reported from previous visits.  Review of Systems  Constitutional: Negative for activity change and appetite change.  HENT: Positive for postnasal drip, rhinorrhea, sinus pressure and sinus pain. Negative for congestion and sore throat.        Positive for hoarseness.  Eyes: Negative for pain, discharge, redness and itching.  Respiratory: Negative for shortness of breath, wheezing and stridor.   Gastrointestinal: Negative for diarrhea, nausea and vomiting.  Musculoskeletal: Negative for arthralgias, joint swelling and myalgias.  Skin: Negative for rash.  Allergic/Immunologic: Negative for environmental allergies and food allergies.    Objective:  Physical exam not obtained as  encounter was done via telephone.   Previous notes and tests were reviewed.  I discussed the assessment and treatment plan with the patient. The patient was provided an opportunity to ask questions and all were answered. The patient agreed with the plan and demonstrated an understanding of the instructions.   The patient was advised to call back or seek an in-person evaluation if the symptoms worsen or if the condition fails to improve as anticipated.  I provided 19 minutes of non-face-to-face time during this encounter.  It was my pleasure to participate in [place] care today. Please feel free to contact me with any questions or concerns.   Sincerely,  Valentina Shaggy, MD

## 2019-02-09 NOTE — Patient Instructions (Addendum)
1. Moderate persistent asthma, uncomplicated - Continue with omeprazole 40mg  daily. - Daily controller medication(s): Singulair 10mg  daily and Symbicort 160/4.14mcg two puffs twice daily with spacer - Prior to physical activity: ProAir 2 puffs 10-15 minutes before physical activity. - Rescue medications: ProAir 4 puffs every 4-6 hours as needed - Asthma control goals:  * Full participation in all desired activities (may need albuterol before activity) * Albuterol use two time or less a week on average (not counting use with activity) * Cough interfering with sleep two time or less a month * Oral steroids no more than once a year * No hospitalizations  2. Seasonal and perennial allergic rhinitis (dust mites, cats, dogs, cockroach, trees, weeds, grass, mouse, and horse) - Restart allergy injections.   - They are out of date, so you will have to definitely call to let us know you want to restart them.  - Continue with: Hydroxyzine one tablet twice daily, Singulair (montelukast) 10mg  daily, Flonase (fluticasone) two sprays per nostril daily and Astelin (azelastine) 2 sprays per nostril 1-2 times daily as needed - You can use an extra dose of the antihistamine, if needed, for breakthrough symptoms.  - Continue with nasal saline rinses as needed.   3. Sinusitis - Start Augmentin twice daily for ten days. - Start the prednisone burst provided.  4. Return in about 6 months (around 08/09/2019). This can be an in-person, a virtual Webex or a telephone follow up visit.   Please inform us of any Emergency Department visits, hospitalizations, or changes in symptoms. Call us before going to the ED for breathing or allergy symptoms since we might be able to fit you in for a sick visit. Feel free to contact us anytime with any questions, problems, or concerns.  It was a pleasure to talk to you today today!  Websites that have reliable patient information: 1. American Academy of Asthma, Allergy, and  Immunology: www.aaaai.org 2. Food Allergy Research and Education (FARE): foodallergy.org 3. Mothers of Asthmatics: http://www.asthmacommunitynetwork.org 4. American College of Allergy, Asthma, and Immunology: www.acaai.org  "Like" [place] on Facebook and Instagram for our latest updates!        Make sure you are registered to vote! If you have moved or changed any of your contact information, you will need to get this updated before voting!  In some cases, you MAY be able to register to vote online: [email or web address]

## 2019-02-11 ENCOUNTER — Encounter: Payer: Self-pay | Admitting: Allergy & Immunology

## 2019-02-13 ENCOUNTER — Other Ambulatory Visit: Payer: Self-pay | Admitting: Allergy & Immunology

## 2019-02-25 ENCOUNTER — Other Ambulatory Visit: Payer: Self-pay | Admitting: Allergy & Immunology

## 2019-02-28 ENCOUNTER — Other Ambulatory Visit: Payer: Self-pay | Admitting: Allergy & Immunology

## 2019-02-28 NOTE — Telephone Encounter (Signed)
Is it appropriate to refill this medication, I did not see it listed on your last avs.

## 2019-03-09 ENCOUNTER — Emergency Department (HOSPITAL_BASED_OUTPATIENT_CLINIC_OR_DEPARTMENT_OTHER): Payer: 59

## 2019-03-09 ENCOUNTER — Emergency Department (HOSPITAL_BASED_OUTPATIENT_CLINIC_OR_DEPARTMENT_OTHER)
Admission: EM | Admit: 2019-03-09 | Discharge: 2019-03-09 | Disposition: A | Discharge status: Home / Self Care | Payer: 59 | Attending: Emergency Medicine | Admitting: Emergency Medicine

## 2019-03-09 ENCOUNTER — Other Ambulatory Visit: Payer: Self-pay

## 2019-03-09 ENCOUNTER — Encounter (HOSPITAL_BASED_OUTPATIENT_CLINIC_OR_DEPARTMENT_OTHER): Payer: Self-pay | Admitting: Emergency Medicine

## 2019-03-09 ENCOUNTER — Ambulatory Visit: Payer: 59 | Admitting: Internal Medicine

## 2019-03-09 DIAGNOSIS — J45909 Unspecified asthma, uncomplicated: Secondary | ICD-10-CM | POA: Diagnosis not present

## 2019-03-09 DIAGNOSIS — Z87891 Personal history of nicotine dependence: Secondary | ICD-10-CM | POA: Insufficient documentation

## 2019-03-09 DIAGNOSIS — Z79899 Other long term (current) drug therapy: Secondary | ICD-10-CM | POA: Insufficient documentation

## 2019-03-09 DIAGNOSIS — K921 Melena: Secondary | ICD-10-CM

## 2019-03-09 DIAGNOSIS — Z888 Allergy status to other drugs, medicaments and biological substances status: Secondary | ICD-10-CM | POA: Diagnosis not present

## 2019-03-09 DIAGNOSIS — R195 Other fecal abnormalities: Secondary | ICD-10-CM | POA: Diagnosis present

## 2019-03-09 HISTORY — DX: Post-traumatic stress disorder, unspecified: F43.10

## 2019-03-09 HISTORY — DX: Anxiety disorder, unspecified: F41.9

## 2019-03-09 HISTORY — DX: Obesity, unspecified: E66.9

## 2019-03-09 HISTORY — DX: Depression, unspecified: F32.A

## 2019-03-09 LAB — URINALYSIS, MICROSCOPIC (REFLEX)

## 2019-03-09 LAB — CBC WITH DIFFERENTIAL/PLATELET
Abs Immature Granulocytes: 0.03 10*3/uL (ref 0.00–0.07)
Basophils Absolute: 0 10*3/uL (ref 0.0–0.1)
Basophils Relative: 0 %
Eosinophils Absolute: 0.2 10*3/uL (ref 0.0–0.5)
Eosinophils Relative: 2 %
HCT: 40.1 % (ref 36.0–46.0)
Hemoglobin: 13.1 g/dL (ref 12.0–15.0)
Immature Granulocytes: 0 %
Lymphocytes Relative: 16 %
Lymphs Abs: 1.7 10*3/uL (ref 0.7–4.0)
MCH: 28.2 pg (ref 26.0–34.0)
MCHC: 32.7 g/dL (ref 30.0–36.0)
MCV: 86.4 fL (ref 80.0–100.0)
Monocytes Absolute: 0.6 10*3/uL (ref 0.1–1.0)
Monocytes Relative: 6 %
Neutro Abs: 7.9 10*3/uL — ABNORMAL HIGH (ref 1.7–7.7)
Neutrophils Relative %: 76 %
Platelets: 288 10*3/uL (ref 150–400)
RBC: 4.64 MIL/uL (ref 3.87–5.11)
RDW: 12.7 % (ref 11.5–15.5)
WBC: 10.4 10*3/uL (ref 4.0–10.5)
nRBC: 0 % (ref 0.0–0.2)

## 2019-03-09 LAB — COMPREHENSIVE METABOLIC PANEL
ALT: 24 U/L (ref 0–44)
AST: 22 U/L (ref 15–41)
Albumin: 3.4 g/dL — ABNORMAL LOW (ref 3.5–5.0)
Alkaline Phosphatase: 78 U/L (ref 38–126)
Anion gap: 8 (ref 5–15)
BUN: 7 mg/dL (ref 6–20)
CO2: 24 mmol/L (ref 22–32)
Calcium: 8.8 mg/dL — ABNORMAL LOW (ref 8.9–10.3)
Chloride: 105 mmol/L (ref 98–111)
Creatinine, Ser: 0.85 mg/dL (ref 0.44–1.00)
GFR calc Af Amer: >60 mL/min (ref >60)
GFR calc non Af Amer: >60 mL/min (ref >60)
Glucose, Bld: 113 mg/dL — ABNORMAL HIGH (ref 70–99)
Potassium: 3.7 mmol/L (ref 3.5–5.1)
Sodium: 137 mmol/L (ref 135–145)
Total Bilirubin: 0.8 mg/dL (ref 0.3–1.2)
Total Protein: 6.6 g/dL (ref 6.5–8.1)

## 2019-03-09 LAB — URINALYSIS, ROUTINE W REFLEX MICROSCOPIC
Glucose, UA: NEGATIVE mg/dL
Ketones, ur: NEGATIVE mg/dL
Leukocytes,Ua: NEGATIVE
Nitrite: NEGATIVE
Protein, ur: NEGATIVE mg/dL
Specific Gravity, Urine: >1.03 — ABNORMAL HIGH (ref 1.005–1.030)
pH: 6 (ref 5.0–8.0)

## 2019-03-09 LAB — OCCULT BLOOD X 1 CARD TO LAB, STOOL: Fecal Occult Bld: POSITIVE — AB

## 2019-03-09 MED ORDER — IOHEXOL 300 MG/ML  SOLN
100.0000 mL | Freq: Once | INTRAMUSCULAR | Status: AC | PRN
  Administered 2019-03-09: 100 mL via INTRAVENOUS

## 2019-03-09 NOTE — ED Triage Notes (Signed)
Painful bowel movements for the past week.  Blood in stool since yesterday.

## 2019-03-09 NOTE — ED Provider Notes (Signed)
[facility] EMERGENCY DEPARTMENT Provider Note   CSN: [id number] Arrival date & time: 03/09/19  1021     History Chief Complaint  Patient presents with  . Blood in stool    [name] is a 34 y.o. female with a past medical history significant for PTSD, depression, borderline personality disorder, obesity, and asthma who presents to the ED due to painful bowel movements x1 week.  Patient states she has had decreased appetite over the past week because she has been taking care of her dog who recently had a stroke.  Patient admits to blood in the toilet bowl starting last night.  She admits to having a history of internal hemorrhoids in the past.  Patient admits to abdominal cramps.  She also admits to dizziness over the past few days.  Her last menstrual period was 1 week ago.  She also admits to intermittent dysuria.  Patient denies fever, nausea, vomiting, and diarrhea. Denies history of diverticulosis.     Past Medical History:  Diagnosis Date  . Allergy   . Anxiety   . Asthma   . Borderline personality disorder ([place])   . Depression   . Eczema   . Former smoker   . Obesity   . PTSD (post-traumatic stress disorder)   . Urticaria     Patient Active Problem List   Diagnosis Date Noted  . Allergic asthma 01/13/2018  . Rash 01/13/2018  . Allergic rhinitis 03/12/2017  . Hyperglycemia 01/01/2017  . Allergy   . Former smoker   . Anxiety 10/26/2016  . Bronchopneumonia 10/13/2016  . Acute asthma exacerbation 10/13/2016  . Hypokalemia 10/13/2016  . Encounter for general adult medical examination with abnormal findings 04/17/2016  . Class 2 obesity due to excess calories without serious comorbidity with body mass index (BMI) of 37.0 to 37.9 in adult 04/17/2016  . Asthma 03/13/2016  . Borderline personality disorder ([place]) 03/13/2016  . Eczema 03/13/2016  . Cough 01/28/2016  . Antibiotic-associated diarrhea 01/28/2016  . Mood disorder ([place]) 03/04/2013     Past Surgical History:  Procedure Laterality Date  . Thumb surgery Left      OB History   No obstetric history on file.     Family History  Problem Relation Age of Onset  . Healthy Mother   . Healthy Father   . Alzheimer's disease Maternal Grandmother   . Skin cancer Paternal Grandmother   . Allergic rhinitis Paternal Grandmother   . Asthma Paternal Grandmother   . Angioedema Neg Hx   . Eczema Neg Hx   . Urticaria Neg Hx     Social History   Tobacco Use  . Smoking status: Former Smoker    Packs/day: 0.50    Years: 9.00    Pack years: 4.50    Quit date: 12/04/2015    Years since quitting: 3.2  . Smokeless tobacco: Never Used  Substance Use Topics  . Alcohol use: Yes    Alcohol/week: 8.0 standard drinks    Types: 8 Cans of beer per week    Comment: occ  . Drug use: Yes    Frequency: 4.0 times per week    Types: Marijuana    Comment: Last smoked this morning-03/09/19    Home Medications Prior to Admission medications   Medication Sig Start Date End Date Taking? Authorizing Provider  albuterol (VENTOLIN HFA) 108 (90 Base) MCG/ACT inhaler USE 2 PUFFS EVERY 6 HOURS AS NEEDED FOR WHEEZING OR SHORTNESS OF BREATH 09/22/18  Yes Salvatore Marvel  Louis, MD  budesonide-formoterol [facility]) 160-4.5 MCG/ACT inhaler Inhale 2 puffs into the lungs 2 (two) times daily. 09/22/18 03/09/19 Yes Valentina Shaggy, MD  FLUoxetine (PROZAC) 40 MG capsule Take 1 capsule (40 mg total) by mouth daily. 10/15/16  Yes Regalado, Belkys A, MD  fluticasone (FLONASE) 50 MCG/ACT nasal spray USE 2 SPRAYS IN EACH NOSTRIL ONCE DAILY 02/13/19  Yes Valentina Shaggy, MD  gabapentin (NEURONTIN) 300 MG capsule Take 1 capsule (300 mg total) by mouth 3 (three) times daily. 05/01/14  Yes Wendie Agreste, MD  hydrOXYzine (ATARAX/VISTARIL) 50 MG tablet Take 1 tablet (50 mg total) by mouth 2 (two) times daily. Patient taking differently: Take by mouth. 1 tablet in the morning. 2 tablets at night 09/22/18   Yes Valentina Shaggy, MD  montelukast (SINGULAIR) 10 MG tablet Take 1 tablet (10 mg total) by mouth at bedtime. 09/22/18  Yes Valentina Shaggy, MD  norethindrone-ethinyl estradiol (OVCON-50) 1-50 MG-MCG tablet Take 1 tablet by mouth daily.   Yes [provider]  nystatin-triamcinolone ointment (MYCOLOG) APPLY TO AFFECTED AREA TWICE DAILY. 03/01/19  Yes Valentina Shaggy, MD  omeprazole (PRILOSEC) 40 MG capsule Take 1 capsule (40 mg total) by mouth daily. 09/22/18  Yes Valentina Shaggy, MD  paliperidone (INVEGA) 6 MG 24 hr tablet Take 1 tablet (6 mg total) by mouth daily. 01/07/18  Yes Biagio Borg, MD  levalbuterol [facility]) 0.63 MG/3ML nebulizer solution Take 3 mLs (0.63 mg total) by nebulization every 8 (eight) hours as needed for wheezing or shortness of breath. Patient not taking: Reported on 02/09/2019 09/22/18   Valentina Shaggy, MD    Allergies    Cogentin [benztropine] and Other  Review of Systems   Review of Systems  Constitutional: Positive for chills. Negative for fever.  Respiratory: Negative for cough.   Cardiovascular: Negative for chest pain.  Gastrointestinal: Positive for abdominal pain (cramping), anal bleeding and blood in stool. Negative for diarrhea, nausea and vomiting.  Genitourinary: Positive for difficulty urinating and dysuria.  Neurological: Positive for dizziness.  All other systems reviewed and are negative.   Physical Exam Updated Vital Signs BP 122/84 (BP Location: Right Arm)   Pulse 79   Temp 98.6 F (37 C) (Oral)   Resp 16   Ht 5\' 2"  (1.575 m)   Wt 110.8 kg   LMP 03/02/2019   SpO2 96%   BMI 44.68 kg/m   Physical Exam Vitals and nursing note reviewed.  Constitutional:      General: She is not in acute distress.    Appearance: She is not toxic-appearing.  HENT:     Head: Normocephalic.  Eyes:     Conjunctiva/sclera: Conjunctivae normal.  Cardiovascular:     Rate and Rhythm: Normal rate and regular rhythm.      Pulses: Normal pulses.     Heart sounds: Normal heart sounds. No murmur. No friction rub. No gallop.   Pulmonary:     Effort: Pulmonary effort is normal.     Breath sounds: Normal breath sounds.  Abdominal:     General: Abdomen is flat. Bowel sounds are normal. There is no distension.     Palpations: Abdomen is soft.     Tenderness: There is no abdominal tenderness. There is no guarding or rebound.  Genitourinary:    Rectum: Normal. No external hemorrhoid. Normal anal tone.     Comments: Rectal exam performed with chaperone in room. No visible external hemorrhoids or fissures. Very small amount of stool collected.  Unsure if enough for fecal occult test.  Musculoskeletal:     Cervical back: Neck supple.     Comments: Able to move all 4 extremities without difficulty. No lower extremity edema.   Skin:    Coloration: Skin is pale.  Neurological:     General: No focal deficit present.     Mental Status: She is alert.  Psychiatric:        Mood and Affect: Mood normal.        Behavior: Behavior normal.     ED Results / Procedures / Treatments   Labs (all labs ordered are listed, but only abnormal results are displayed) Labs Reviewed  CBC WITH DIFFERENTIAL/PLATELET - Abnormal; Notable for the following components:      Result Value   Neutro Abs 7.9 (*)    All other components within normal limits  COMPREHENSIVE METABOLIC PANEL - Abnormal; Notable for the following components:   Glucose, Bld 113 (*)    Calcium 8.8 (*)    Albumin 3.4 (*)    All other components within normal limits  URINALYSIS, ROUTINE W REFLEX MICROSCOPIC - Abnormal; Notable for the following components:   Color, Urine AMBER (*)    APPearance CLOUDY (*)    Specific Gravity, Urine >1.030 (*)    Hgb urine dipstick LARGE (*)    Bilirubin Urine SMALL (*)    All other components within normal limits  OCCULT BLOOD X 1 CARD TO LAB, STOOL - Abnormal; Notable for the following components:   Fecal Occult Bld POSITIVE (*)     All other components within normal limits  URINALYSIS, MICROSCOPIC (REFLEX) - Abnormal; Notable for the following components:   Bacteria, UA MANY (*)    All other components within normal limits    EKG None  Radiology CT ABDOMEN PELVIS W CONTRAST  Result Date: 03/09/2019 CLINICAL DATA:  Blood in stool for 1 day EXAM: CT ABDOMEN AND PELVIS WITH CONTRAST TECHNIQUE: Multidetector CT imaging of the abdomen and pelvis was performed using the standard protocol following bolus administration of intravenous contrast. CONTRAST:  153mL OMNIPAQUE IOHEXOL 300 MG/ML  SOLN COMPARISON:  10/13/2016 FINDINGS: Lower chest: Minimal hypoventilatory changes within the dependent lower lobes. No acute pleural or parenchymal lung disease. Hepatobiliary: No focal liver abnormality is seen. No gallstones, gallbladder wall thickening, or biliary dilatation. Pancreas: Unremarkable. No pancreatic ductal dilatation or surrounding inflammatory changes. Spleen: Normal in size without focal abnormality. Adrenals/Urinary Tract: Slight malrotation of the lower poles of the bilateral kidneys. Otherwise the kidneys, adrenals, ureters, and bladder are unremarkable. Stomach/Bowel: No bowel obstruction or ileus. Normal appendix right lower quadrant. No inflammatory changes. Vascular/Lymphatic: No significant vascular findings are present. No enlarged abdominal or pelvic lymph nodes. Reproductive: Uterus and bilateral adnexa are unremarkable. Other: No abdominal wall hernia or abnormality. No abdominopelvic ascites. Musculoskeletal: No acute or destructive bony lesions. Reconstructed images demonstrate no additional findings. IMPRESSION: 1. No acute intra-abdominal or intrapelvic process. Electronically Signed   By: Randa Ngo M.D.   On: 03/09/2019 14:44    Procedures Procedures (including critical care time)  Medications Ordered in ED Medications  iohexol (OMNIPAQUE) 300 MG/ML solution 100 mL (100 mLs Intravenous Contrast Given  03/09/19 1426)    ED Course  I have reviewed the triage vital signs and the nursing notes.  Pertinent labs & imaging results that were available during my care of the patient were reviewed by me and considered in my medical decision making (see chart for details).    MDM Rules/Calculators/A&P  34 year old female presents to the ED for an evaluation of painful stools x1 week associated with blood in the toilet bowl starting yesterday.  Patient admits to decreased appetite over the past week due to stress related to her dog just having a stroke.  Patient admits to a history of internal hemorrhoids.  Upon arrival, patient is afebrile, tachycardic at 126, but otherwise unremarkable vitals.  Physical exam reassuring.  Rectal exam performed with chaperone in the room which was negative for external hemorrhoids and fissures.  Abdomen soft, nondistended, and nontender.  Will obtain routine labs to check hemoglobin level.  CBC reassuring with no leukocytosis and normal hemoglobin.  CMP reassuring with normal renal function. UA significant for large amount of hematuria, but no signs of infection. Fecal occult positive. Given patient has a significant amount of blood, will obtain CT scan to rule out diverticulosis or other intraabdominal etiology. Discussed case with Dr. Langston Masker who agrees with assessment and plan.  CT scan personally reviewed which is negative for any acute abnormalities. Will discharge patient with GI follow-up. Suspect bleeding coming from internal hemorrhoids. Instructed patient to stay hydrate and to continue eating foods heavy in fiber. Strict ED precautions discussed with patient. Patient states understanding and agrees to plan. Patient discharged home in no acute distress and stable vitals .  Final Clinical Impression(s) / ED Diagnoses Final diagnoses:  Hematochezia    Rx / DC Orders ED Discharge Orders    None       Karie Kirks 03/09/19  1457    Wyvonnia Dusky, MD 03/09/19 Einar Crow

## 2019-03-09 NOTE — Discharge Instructions (Signed)
As discussed, all of your labs and CT scan looked good today. I have included the number of the GI doctor. Call Monday to schedule an appointment if symptoms do not improve. Follow-up with PCP if symptoms do not improve within the next week. Return to the ER for new or worsening symptoms.

## 2019-03-10 ENCOUNTER — Encounter: Payer: Self-pay | Admitting: Internal Medicine

## 2019-03-30 ENCOUNTER — Other Ambulatory Visit: Payer: Self-pay | Admitting: Internal Medicine

## 2019-04-15 ENCOUNTER — Other Ambulatory Visit: Payer: Self-pay | Admitting: Internal Medicine

## 2019-04-27 ENCOUNTER — Ambulatory Visit: Payer: 59 | Attending: Internal Medicine

## 2019-04-27 DIAGNOSIS — Z23 Encounter for immunization: Secondary | ICD-10-CM

## 2019-04-27 NOTE — Progress Notes (Signed)
   Covid-19 Vaccination Clinic  Name:  [name]    MRN: [id number] DOB: 02/21/1985  04/27/2019  [name] was observed post Covid-19 immunization for 15 minutes without incident. She was provided with Vaccine Information Sheet and instruction to access the V-Safe system.   [name] was instructed to call 911 with any severe reactions post vaccine: [name] Difficulty breathing  . Swelling of face and throat  . A fast heartbeat  . A bad rash all over body  . Dizziness and weakness   Immunizations Administered    Name Date Dose VIS Date Route   Pfizer COVID-19 Vaccine 04/27/2019  1:22 PM 0.3 mL 01/13/2019 Intramuscular   Manufacturer: [place]   Lot: [id number]   [place]: [id number]

## 2019-04-29 ENCOUNTER — Other Ambulatory Visit: Payer: Self-pay | Admitting: Internal Medicine

## 2019-05-10 ENCOUNTER — Other Ambulatory Visit: Payer: Self-pay | Admitting: Internal Medicine

## 2019-05-19 ENCOUNTER — Other Ambulatory Visit: Payer: Self-pay | Admitting: Allergy & Immunology

## 2019-05-22 ENCOUNTER — Ambulatory Visit: Payer: 59 | Attending: Internal Medicine

## 2019-05-22 DIAGNOSIS — Z23 Encounter for immunization: Secondary | ICD-10-CM

## 2019-05-22 NOTE — Progress Notes (Signed)
   Covid-19 Vaccination Clinic  Name:  [name]    MRN: [id number] DOB: 06/12/1985  05/22/2019  [name] was observed post Covid-19 immunization for 15 minutes without incident. She was provided with Vaccine Information Sheet and instruction to access the V-Safe system.   [name] was instructed to call 911 with any severe reactions post vaccine: [name] Difficulty breathing  . Swelling of face and throat  . A fast heartbeat  . A bad rash all over body  . Dizziness and weakness   Immunizations Administered    Name Date Dose VIS Date Route   Pfizer COVID-19 Vaccine 05/22/2019  1:36 PM 0.3 mL 03/29/2018 Intramuscular   Manufacturer: [place]   Lot: [id number]   [place]: [id number]

## 2019-06-02 ENCOUNTER — Other Ambulatory Visit: Payer: Self-pay | Admitting: Internal Medicine

## 2019-06-02 ENCOUNTER — Encounter: Payer: Self-pay | Admitting: Internal Medicine

## 2019-06-02 ENCOUNTER — Other Ambulatory Visit: Payer: Self-pay

## 2019-06-02 ENCOUNTER — Ambulatory Visit: Payer: 59 | Admitting: Internal Medicine

## 2019-06-02 VITALS — BP 106/62 | HR 76 | Temp 98.0°F | Ht 62.0 in | Wt 233.0 lb

## 2019-06-02 DIAGNOSIS — Z0000 Encounter for general adult medical examination without abnormal findings: Secondary | ICD-10-CM

## 2019-06-02 DIAGNOSIS — R739 Hyperglycemia, unspecified: Secondary | ICD-10-CM | POA: Diagnosis not present

## 2019-06-02 DIAGNOSIS — E538 Deficiency of other specified B group vitamins: Secondary | ICD-10-CM | POA: Diagnosis not present

## 2019-06-02 DIAGNOSIS — J4540 Moderate persistent asthma, uncomplicated: Secondary | ICD-10-CM

## 2019-06-02 DIAGNOSIS — R21 Rash and other nonspecific skin eruption: Secondary | ICD-10-CM

## 2019-06-02 DIAGNOSIS — F419 Anxiety disorder, unspecified: Secondary | ICD-10-CM

## 2019-06-02 DIAGNOSIS — E559 Vitamin D deficiency, unspecified: Secondary | ICD-10-CM | POA: Diagnosis not present

## 2019-06-02 DIAGNOSIS — E611 Iron deficiency: Secondary | ICD-10-CM | POA: Diagnosis not present

## 2019-06-02 DIAGNOSIS — Z0001 Encounter for general adult medical examination with abnormal findings: Secondary | ICD-10-CM

## 2019-06-02 LAB — CBC WITH DIFFERENTIAL/PLATELET
Basophils Absolute: 0.1 K/uL (ref 0.0–0.1)
Basophils Relative: 1.2 % (ref 0.0–3.0)
Eosinophils Absolute: 0.2 K/uL (ref 0.0–0.7)
Eosinophils Relative: 2.1 % (ref 0.0–5.0)
HCT: 42.6 % (ref 36.0–46.0)
Hemoglobin: 13.9 g/dL (ref 12.0–15.0)
Lymphocytes Relative: 22.1 % (ref 12.0–46.0)
Lymphs Abs: 2 K/uL (ref 0.7–4.0)
MCHC: 32.7 g/dL (ref 30.0–36.0)
MCV: 84.3 fl (ref 78.0–100.0)
Monocytes Absolute: 0.5 K/uL (ref 0.1–1.0)
Monocytes Relative: 5.9 % (ref 3.0–12.0)
Neutro Abs: 6.3 K/uL (ref 1.4–7.7)
Neutrophils Relative %: 68.7 % (ref 43.0–77.0)
Platelets: 320 K/uL (ref 150.0–400.0)
RBC: 5.05 Mil/uL (ref 3.87–5.11)
RDW: 13.6 % (ref 11.5–15.5)
WBC: 9.1 K/uL (ref 4.0–10.5)

## 2019-06-02 LAB — URINALYSIS, ROUTINE W REFLEX MICROSCOPIC
Hgb urine dipstick: NEGATIVE
Ketones, ur: NEGATIVE
Leukocytes,Ua: NEGATIVE
Nitrite: NEGATIVE
Specific Gravity, Urine: >=1.03 — AB (ref 1.000–1.030)
Total Protein, Urine: NEGATIVE
Urine Glucose: NEGATIVE
Urobilinogen, UA: 0.2 (ref 0.0–1.0)
pH: 6 (ref 5.0–8.0)

## 2019-06-02 LAB — BASIC METABOLIC PANEL WITH GFR
BUN: 7 mg/dL (ref 6–23)
CO2: 28 meq/L (ref 19–32)
Calcium: 9 mg/dL (ref 8.4–10.5)
Chloride: 103 meq/L (ref 96–112)
Creatinine, Ser: 0.94 mg/dL (ref 0.40–1.20)
GFR: 69.21 mL/min
Glucose, Bld: 101 mg/dL — ABNORMAL HIGH (ref 70–99)
Potassium: 4.5 meq/L (ref 3.5–5.1)
Sodium: 137 meq/L (ref 135–145)

## 2019-06-02 LAB — LIPID PANEL
Cholesterol: 190 mg/dL (ref 0–200)
HDL: 54 mg/dL (ref >39.00)
LDL Cholesterol: 121 mg/dL — ABNORMAL HIGH (ref 0–99)
NonHDL: 135.76
Total CHOL/HDL Ratio: 4
Triglycerides: 74 mg/dL (ref 0.0–149.0)
VLDL: 14.8 mg/dL (ref 0.0–40.0)

## 2019-06-02 LAB — HEPATIC FUNCTION PANEL
ALT: 24 U/L (ref 0–35)
AST: 26 U/L (ref 0–37)
Albumin: 4 g/dL (ref 3.5–5.2)
Alkaline Phosphatase: 92 U/L (ref 39–117)
Bilirubin, Direct: 0.2 mg/dL (ref 0.0–0.3)
Total Bilirubin: 0.4 mg/dL (ref 0.2–1.2)
Total Protein: 7 g/dL (ref 6.0–8.3)

## 2019-06-02 LAB — VITAMIN B12: Vitamin B-12: 164 pg/mL — ABNORMAL LOW (ref 211–911)

## 2019-06-02 LAB — IBC PANEL
Iron: 3 ug/dL — ABNORMAL LOW (ref 42–145)
Saturation Ratios: 0.5 % — ABNORMAL LOW (ref 20.0–50.0)
Transferrin: 413 mg/dL — ABNORMAL HIGH (ref 212.0–360.0)

## 2019-06-02 LAB — HEMOGLOBIN A1C: Hgb A1c MFr Bld: 5.6 % (ref 4.6–6.5)

## 2019-06-02 LAB — VITAMIN D 25 HYDROXY (VIT D DEFICIENCY, FRACTURES): VITD: 26.14 ng/mL — ABNORMAL LOW (ref 30.00–100.00)

## 2019-06-02 LAB — TSH: TSH: 0.92 u[IU]/mL (ref 0.35–4.50)

## 2019-06-02 MED ORDER — VITAMIN B-12 1000 MCG PO TABS: 1000.0000 ug | ORAL_TABLET | Freq: Every day | ORAL | 1 refills | Status: DC

## 2019-06-02 MED ORDER — NYSTATIN 100000 UNIT/GM EX POWD: CUTANEOUS | 5 refills | Status: DC

## 2019-06-02 MED ORDER — NYSTATIN-TRIAMCINOLONE 100000-0.1 UNIT/GM-% EX OINT: TOPICAL_OINTMENT | CUTANEOUS | 2 refills | Status: DC

## 2019-06-02 MED ORDER — VITAMIN D (ERGOCALCIFEROL) 1.25 MG (50000 UNIT) PO CAPS: 50000.0000 [IU] | ORAL_CAPSULE | ORAL | 0 refills | Status: DC

## 2019-06-02 MED ORDER — FLUCONAZOLE 100 MG PO TABS: 100.0000 mg | ORAL_TABLET | Freq: Every day | ORAL | 0 refills | Status: AC

## 2019-06-02 MED ORDER — POLYSACCHARIDE IRON COMPLEX 150 MG PO CAPS: 150.0000 mg | ORAL_CAPSULE | Freq: Every day | ORAL | 1 refills | Status: DC

## 2019-06-02 NOTE — Assessment & Plan Note (Signed)
stable overall by history and exam, recent data reviewed with pt, and pt to continue medical treatment as before,  to f/u any worsening symptoms or concerns  

## 2019-06-02 NOTE — Assessment & Plan Note (Addendum)
C/w fungal - for diflucan 100 qd x 7 and powder/cream asd prn  I spent 22 minutes in addition to time for CPX wellness examination in preparing to see the patient by review of recent labs, imaging and procedures, obtaining and reviewing separately obtained history, communicating with the patient and family or caregiver, ordering medications, tests or procedures, and documenting clinical information in the EHR including the differential Dx, treatment, and any further evaluation and other management of rash, asthma, anxiety, hyperglycemia

## 2019-06-02 NOTE — Progress Notes (Signed)
Subjective:    [name], female    DOB: [birth year], 34 y.o.   MRN: [id number]  HPI  Here for wellness and f/u;  Overall doing ok;  Pt denies Chest pain, worsening SOB, DOE, wheezing, orthopnea, PND, worsening LE edema, palpitations, dizziness or syncope.  Pt denies neurological change such as new headache, facial or extremity weakness.  Pt denies polydipsia, polyuria, or low sugar symptoms. Pt states overall good compliance with treatment and medications, good tolerability, and has been trying to follow appropriate diet.  Pt denies worsening depressive symptoms, suicidal ideation or panic. No fever, night sweats, wt loss, loss of appetite, or other constitutional symptoms.  Pt states good ability with ADL's, has low fall risk, home safety reviewed and adequate, no other significant changes in hearing or vision, and only occasionally active with exercise. Also with recurring yeast rash under breasts (better with nystatin powder) and groin (better with nystatin cream) but keeps coming back and now moderate.   Past Medical History:  Diagnosis Date  . Allergy   . Anxiety   . Asthma   . Borderline personality disorder ([place])   . Depression   . Eczema   . Former smoker   . Obesity   . PTSD (post-traumatic stress disorder)   . Urticaria    Past Surgical History:  Procedure Laterality Date  . Thumb surgery Left     reports that she quit smoking about 3 years ago. She has a 4.50 pack-year smoking history. She has never used smokeless tobacco. She reports current alcohol use of about 8.0 standard drinks of alcohol per week. She reports current drug use. Frequency: 4.00 times per week. Drug: Marijuana. family history includes Allergic rhinitis in her paternal grandmother; Alzheimer's disease in her maternal grandmother; Asthma in her paternal grandmother; Healthy in her father and mother; Skin cancer in her paternal grandmother. Allergies  Allergen Reactions  . Cogentin  [Benztropine]   . Other     Opiates- history of dependency    Current Outpatient Medications on File Prior to Visit  Medication Sig Dispense Refill  . albuterol (VENTOLIN HFA) 108 (90 Base) MCG/ACT inhaler USE 2 PUFFS EVERY 6 HOURS AS NEEDED FOR WHEEZING OR SHORTNESS OF BREATH 18 g 1  . FLUoxetine (PROZAC) 40 MG capsule Take 1 capsule (40 mg total) by mouth daily. 30 capsule 0  . fluticasone (FLONASE) 50 MCG/ACT nasal spray USE 2 SPRAYS IN EACH NOSTRIL ONCE DAILY 16 g 5  . gabapentin (NEURONTIN) 300 MG capsule Take 1 capsule (300 mg total) by mouth 3 (three) times daily. 90 capsule 2  . hydrOXYzine (ATARAX/VISTARIL) 50 MG tablet Take 1 tablet (50 mg total) by mouth 2 (two) times daily. (Patient taking differently: Take by mouth. 1 tablet in the morning. 2 tablets at night) 30 tablet 5  . montelukast (SINGULAIR) 10 MG tablet Take 1 tablet (10 mg total) by mouth at bedtime. 30 tablet 5  . norethindrone-ethinyl estradiol (OVCON-50) 1-50 MG-MCG tablet Take 1 tablet by mouth daily.    [name] omeprazole (PRILOSEC) 40 MG capsule TAKE (1) CAPSULE DAILY. 30 capsule 1  . paliperidone (INVEGA) 6 MG 24 hr tablet Take 1 tablet (6 mg total) by mouth daily. 7 tablet 0  . PORTIA-28 0.15-30 MG-MCG tablet Take 1 tablet by mouth daily.    . budesonide-formoterol (SYMBICORT) 160-4.5 MCG/ACT inhaler Inhale 2 puffs into the lungs 2 (two) times daily. 1 Inhaler 5   No current facility-administered medications on file prior to visit.  Review of Systems All otherwise neg per pt    Objective:   Physical Exam BP 106/62 (BP Location: Left Arm, Patient Position: Sitting, Cuff Size: Large)   Pulse 76   Temp 98 F (36.7 C) (Oral)   Ht 5\' 2"  (1.575 m)   Wt 233 lb (105.7 kg)   SpO2 96%   BMI 42.62 kg/m  VS noted,  Constitutional: Pt appears in NAD HENT: Head: NCAT.  Right Ear: External ear normal.  Left Ear: External ear normal.  Eyes: . Pupils are equal, round, and reactive to light. Conjunctivae and EOM are  normal Nose: without d/c or deformity Neck: Neck supple. Gross normal ROM Cardiovascular: Normal rate and regular rhythm.   Pulmonary/Chest: Effort normal and breath sounds without rales or wheezing.  Abd:  Soft, NT, ND, + BS, no organomegaly Neurological: Pt is alert. At baseline orientation, motor grossly intact Skin: Skin is warm. No rashes, other new lesions, no LE edema Psychiatric: Pt behavior is normal without agitation  All otherwise neg per pt  Lab Results  Component Value Date   WBC 9.1 06/02/2019   HGB 13.9 06/02/2019   HCT 42.6 06/02/2019   PLT 320.0 06/02/2019   GLUCOSE 101 (H) 06/02/2019   CHOL 190 06/02/2019   TRIG 74.0 06/02/2019   HDL 54.00 06/02/2019   LDLCALC 121 (H) 06/02/2019   ALT 24 06/02/2019   AST 26 06/02/2019   NA 137 06/02/2019   K 4.5 06/02/2019   CL 103 06/02/2019   CREATININE 0.94 06/02/2019   BUN 7 06/02/2019   CO2 28 06/02/2019   TSH 0.92 06/02/2019   HGBA1C 5.6 06/02/2019      Assessment & Plan:

## 2019-06-02 NOTE — Patient Instructions (Signed)
Please take all new medication as prescribed - the yeast medications  Please continue all other medications as before, and refills have been done if requested.  Please have the pharmacy call with any other refills you may need.  Please continue your efforts at being more active, low cholesterol diet, and weight control.  You are otherwise up to date with prevention measures today.  Please keep your appointments with your specialists as you may have planned  Please go to the LAB at the blood drawing area for the tests to be done  You will be contacted by phone if any changes need to be made immediately.  Otherwise, you will receive a letter about your results with an explanation, but please check with MyChart first.  Please remember to sign up for MyChart if you have not done so, as this will be important to you in the future with finding out test results, communicating by private email, and scheduling acute appointments online when needed.  Please make an Appointment to return for your 1 year visit, or sooner if needed

## 2019-06-02 NOTE — Assessment & Plan Note (Signed)

## 2019-06-20 ENCOUNTER — Other Ambulatory Visit: Payer: Self-pay | Admitting: Allergy & Immunology

## 2019-06-22 ENCOUNTER — Other Ambulatory Visit: Payer: Self-pay | Admitting: Allergy & Immunology

## 2019-06-22 ENCOUNTER — Encounter: Payer: Self-pay | Admitting: Internal Medicine

## 2019-06-23 MED ORDER — VITAMIN B-12 1000 MCG PO TABS: 1000.0000 ug | ORAL_TABLET | Freq: Every day | ORAL | 1 refills | Status: DC

## 2019-06-23 MED ORDER — POLYSACCHARIDE IRON COMPLEX 150 MG PO CAPS: 150.0000 mg | ORAL_CAPSULE | Freq: Every day | ORAL | 1 refills | Status: DC

## 2019-07-11 ENCOUNTER — Ambulatory Visit: Payer: Self-pay | Admitting: Allergy & Immunology

## 2019-07-19 ENCOUNTER — Other Ambulatory Visit: Payer: Self-pay

## 2019-07-19 MED ORDER — OMEPRAZOLE 40 MG PO CPDR: 40.0000 mg | DELAYED_RELEASE_CAPSULE | Freq: Every day | ORAL | 1 refills | Status: DC

## 2019-08-10 ENCOUNTER — Other Ambulatory Visit: Payer: Self-pay | Admitting: Allergy & Immunology

## 2019-09-07 ENCOUNTER — Encounter: Payer: Self-pay | Admitting: Allergy & Immunology

## 2019-09-07 ENCOUNTER — Other Ambulatory Visit: Payer: Self-pay

## 2019-09-07 ENCOUNTER — Ambulatory Visit (INDEPENDENT_AMBULATORY_CARE_PROVIDER_SITE_OTHER): Payer: 59 | Admitting: Allergy & Immunology

## 2019-09-07 VITALS — BP 116/70 | HR 82 | Temp 96.0°F | Resp 16

## 2019-09-07 DIAGNOSIS — R490 Dysphonia: Secondary | ICD-10-CM | POA: Diagnosis not present

## 2019-09-07 DIAGNOSIS — J302 Other seasonal allergic rhinitis: Secondary | ICD-10-CM

## 2019-09-07 DIAGNOSIS — J4540 Moderate persistent asthma, uncomplicated: Secondary | ICD-10-CM

## 2019-09-07 DIAGNOSIS — J3089 Other allergic rhinitis: Secondary | ICD-10-CM

## 2019-09-07 MED ORDER — MONTELUKAST SODIUM 10 MG PO TABS: 10.0000 mg | ORAL_TABLET | Freq: Every day | ORAL | 5 refills | Status: DC

## 2019-09-07 MED ORDER — HYDROXYZINE HCL 50 MG PO TABS: 50.0000 mg | ORAL_TABLET | Freq: Two times a day (BID) | ORAL | 5 refills | Status: DC

## 2019-09-07 MED ORDER — BUDESONIDE-FORMOTEROL FUMARATE 160-4.5 MCG/ACT IN AERO: 2.0000 | INHALATION_SPRAY | Freq: Two times a day (BID) | RESPIRATORY_TRACT | 5 refills | Status: DC

## 2019-09-07 MED ORDER — ALBUTEROL SULFATE HFA 108 (90 BASE) MCG/ACT IN AERS: INHALATION_SPRAY | RESPIRATORY_TRACT | 1 refills | Status: DC

## 2019-09-07 MED ORDER — FLUTICASONE PROPIONATE 50 MCG/ACT NA SUSP: 2.0000 | Freq: Every day | NASAL | 5 refills | Status: DC

## 2019-09-07 MED ORDER — EPINEPHRINE 0.3 MG/0.3ML IJ SOAJ
0.3000 mg | Freq: Once | INTRAMUSCULAR | 1 refills | Status: AC
Start: 2019-09-07 — End: 2019-09-07

## 2019-09-07 MED ORDER — OMEPRAZOLE 40 MG PO CPDR: 40.0000 mg | DELAYED_RELEASE_CAPSULE | Freq: Every day | ORAL | 5 refills | Status: DC

## 2019-09-07 NOTE — Progress Notes (Signed)
FOLLOW UP  Date of Service/Encounter:  09/07/19   Assessment:   Moderate persistent asthma without complication  Seasonal and perennial allergic rhinitis(dust mites, cats, dogs, cockroach, trees, weeds, grass, mouse, and horse)- interested in restarting allergen immunotherapy  Boderline personality disorder - on a stable regimen  Plan/Recommendations:   1. Moderate persistent asthma, uncomplicated - Continue with omeprazole to 40mg  at night to help with reflux.  - We are not going to make any medication changes at this time.  - Lung function is subpar, but this is likely because you have been out of your Symbicort. - Refills sent in. - Sample of Breztri provided to tide you over until you get your prescriptions.  - Daily controller medication(s): Singulair 10mg  daily and Symbicort 160/4.22mcg two puffs twice daily with spacer - Prior to physical activity: ProAir 2 puffs 10-15 minutes before physical activity. - Rescue medications: ProAir 4 puffs every 4-6 hours as needed - Asthma control goals:  * Full participation in all desired activities (may need albuterol before activity) * Albuterol use two time or less a week on average (not counting use with activity) * Cough interfering with sleep two time or less a month * Oral steroids no more than once a year * No hospitalizations  2. Seasonal and perennial allergic rhinitis (dust mites, cats, dogs, cockroach, trees, weeds, grass, mouse, and horse) - EpiPen refilled. - Make an appointment to restart injections.  - Continue with: Hydroxyzine one tablet twice daily, Singulair (montelukast) 10mg  daily, Flonase (fluticasone) two sprays per nostril daily and Astelin (azelastine) 2 sprays per nostril 1-2 times daily as needed - You can use an extra dose of the antihistamine, if needed, for breakthrough symptoms.  - Continue with nasal saline rinses as needed.    3. Return in about 6 months (around 03/09/2020). This can be an  in-person, a virtual Webex or a telephone follow up visit.   Subjective:   [name] is a 34 y.o. female presenting today for follow up of  Chief Complaint  Patient presents with  . Follow-up    [name] has a history of the following: Patient Active Problem List   Diagnosis Date Noted  . Allergic asthma 01/13/2018  . Rash 01/13/2018  . Allergic rhinitis 03/12/2017  . Hyperglycemia 01/01/2017  . Allergy   . Former smoker   . Anxiety 10/26/2016  . Bronchopneumonia 10/13/2016  . Acute asthma exacerbation 10/13/2016  . Hypokalemia 10/13/2016  . Encounter for well adult exam with abnormal findings 04/17/2016  . Class 2 obesity due to excess calories without serious comorbidity with body mass index (BMI) of 37.0 to 37.9 in adult 04/17/2016  . Asthma 03/13/2016  . Borderline personality disorder ([place]) 03/13/2016  . Eczema 03/13/2016  . Cough 01/28/2016  . Antibiotic-associated diarrhea 01/28/2016  . Mood disorder ([place]) 03/04/2013    History obtained from: chart review and patient.  [name] is a 34 y.o. female presenting for a follow up visit.  She was last seen in January 2021 via televisit.  At that time, we continued with omeprazole 40 mg daily, which we thought was helping her asthma.  We also continue with Singulair and Symbicort 2 puffs twice daily.  For her allergic rhinitis, we recommended restarting her allergy injections.  We continue with hydroxyzine twice daily, Singulair 10 mg daily, Flonase 2 sprays per nostril daily, and Astelin 2 sprays per nostril up to twice daily.  We diagnosed with sinusitis and started Augmentin and prednisone.  Since  the last visit, she has mostly done well.   Asthma/Respiratory Symptom History: She has been using her rescue inhaler 2-4 times per week. She has not bene to the ED. She was doing well on the Symbicort. She thinks that the allergy shots were doing well to help her as well.  [name]'s asthma has been well  controlled. She has not required rescue medication, experienced nocturnal awakenings due to lower respiratory symptoms, nor have activities of daily living been limited. She has required no Emergency Department or Urgent Care visits for her asthma. She has required zero courses of systemic steroids for asthma exacerbations since the last visit. ACT score today is 16, indicating subpar asthma symptom control.   Allergic Rhinitis Symptom History: Her golden retriever passed away at age 54 a few weeks ago. This did help with her allergy symptoms. She got a [employer] recently. They have had her for less than one week. She is very interested in restarting her allergen immunotherapy.  She knows that she needs to make an appointment for this and she does need a new epinephrine autoinjector.  She has not needed antibiotics at all.  Overall, she just felt quite a bit better while on her allergy shots, both from an allergic rhinitis and an asthma perspective.  She is on the hydroxyzine for anxiety. She is on gabapentin and Invega (a mood stabilizer).  She has been stable on this regimen for several years.  Prior to the pandemic, she was working as a Scientist, water quality at [employer].  She ended up taking a leave of absence due to the pandemic and was eventually fired.  She did collect unemployment for a period of time and is now started looking for work since it seems a little safer.  She has been fully vaccinated for COVID-19.  She does not remember which one, but it was either Futures trader.  She had no issues with the vaccination itself.  Otherwise, there have been no changes to her past medical history, surgical history, family history, or social history.    Review of Systems  Constitutional: Negative.  Negative for chills, fever, malaise/fatigue and weight loss.  HENT: Positive for congestion and sinus pain. Negative for ear discharge and ear pain.   Eyes: Negative for pain, discharge and redness.  Respiratory:  Positive for cough, shortness of breath and wheezing. Negative for sputum production.   Cardiovascular: Negative.  Negative for chest pain and palpitations.  Gastrointestinal: Negative for abdominal pain, constipation, diarrhea, heartburn, nausea and vomiting.  Skin: Negative.  Negative for itching and rash.  Neurological: Negative for dizziness and headaches.  Endo/Heme/Allergies: Positive for environmental allergies. Does not bruise/bleed easily.       Objective:   Blood pressure 116/70, pulse 82, temperature (!) 96 F (35.6 C), temperature source Temporal, resp. rate 16, SpO2 96 %. There is no height or weight on file to calculate BMI.   Physical Exam:  Physical Exam Constitutional:      Appearance: She is well-developed. She is obese.     Comments: Talkative.   HENT:     Head: Normocephalic and atraumatic.     Right Ear: Tympanic membrane, ear canal and external ear normal.     Left Ear: Tympanic membrane, ear canal and external ear normal.     Nose: No nasal deformity, septal deviation, mucosal edema or rhinorrhea.     Right Turbinates: Enlarged and swollen.     Left Turbinates: Enlarged and swollen.     Right  Sinus: No maxillary sinus tenderness or frontal sinus tenderness.     Left Sinus: No maxillary sinus tenderness or frontal sinus tenderness.     Comments: Cobblestoning present in the posterior oropharynx.    Mouth/Throat:     Mouth: Mucous membranes are not pale and not dry.     Pharynx: Uvula midline.  Eyes:     General:        Right eye: No discharge.        Left eye: No discharge.     Conjunctiva/sclera: Conjunctivae normal.     Right eye: Right conjunctiva is not injected. No chemosis.    Left eye: Left conjunctiva is not injected. No chemosis.    Pupils: Pupils are equal, round, and reactive to light.  Cardiovascular:     Rate and Rhythm: Normal rate and regular rhythm.     Heart sounds: Normal heart sounds.  Pulmonary:     Effort: Pulmonary effort is  normal. No tachypnea, accessory muscle usage or respiratory distress.     Breath sounds: Wheezing present. No rhonchi or rales.     Comments: Moving air well in all lung fields.  There is some expiratory wheezing noted especially at the bases.  No tachypnea.  No increased work of breathing. Chest:     Chest wall: No tenderness.  Lymphadenopathy:     Cervical: No cervical adenopathy.  Skin:    Coloration: Skin is not pale.     Findings: No abrasion, erythema, petechiae or rash. Rash is not papular, urticarial or vesicular.  Neurological:     Mental Status: She is alert.      Diagnostic studies:    Spirometry: results abnormal (FEV1: 2.02/68%, FVC: 2.67/76%, FEV1/FVC: 76%).    Spirometry consistent with normal pattern.   Allergy Studies: none       Salvatore Marvel, MD  [facility] [place]

## 2019-09-07 NOTE — Patient Instructions (Addendum)
1. Moderate persistent asthma, uncomplicated - Continue with omeprazole to 40mg  at night to help with reflux.  - We are not going to make any medication changes at this time.  - Lung function is subpar, but this is likely because you have been out of your Symbicort. - Refills sent in. - Sample of Breztri provided to tide you over until you get your prescriptions.  - Daily controller medication(s): Singulair 10mg  daily and Symbicort 160/4.67mcg two puffs twice daily with spacer - Prior to physical activity: ProAir 2 puffs 10-15 minutes before physical activity. - Rescue medications: ProAir 4 puffs every 4-6 hours as needed - Asthma control goals:  * Full participation in all desired activities (may need albuterol before activity) * Albuterol use two time or less a week on average (not counting use with activity) * Cough interfering with sleep two time or less a month * Oral steroids no more than once a year * No hospitalizations  2. Seasonal and perennial allergic rhinitis (dust mites, cats, dogs, cockroach, trees, weeds, grass, mouse, and horse) - EpiPen refilled. - Make an appointment to restart injections.  - Continue with: Hydroxyzine one tablet twice daily, Singulair (montelukast) 10mg  daily, Flonase (fluticasone) two sprays per nostril daily and Astelin (azelastine) 2 sprays per nostril 1-2 times daily as needed - You can use an extra dose of the antihistamine, if needed, for breakthrough symptoms.  - Continue with nasal saline rinses as needed.    3. Return in about 6 months (around 03/09/2020). This can be an in-person, a virtual Webex or a telephone follow up visit.   Please inform us of any Emergency Department visits, hospitalizations, or changes in symptoms. Call us before going to the ED for breathing or allergy symptoms since we might be able to fit you in for a sick visit. Feel free to contact us anytime with any questions, problems, or concerns.  It was a pleasure to see you  again today!  Websites that have reliable patient information: 1. American Academy of Asthma, Allergy, and Immunology: www.aaaai.org 2. Food Allergy Research and Education (FARE): foodallergy.org 3. Mothers of Asthmatics: http://www.asthmacommunitynetwork.org 4. American College of Allergy, Asthma, and Immunology: www.acaai.org   COVID-19 Vaccine Information can be found at: ShippingScam.co.uk For questions related to vaccine distribution or appointments, please email vaccine@[email or web address] [email or web address] or call [phone].     Like [place] on [place] and Instagram for our latest updates!        Make sure you are registered to vote! If you have moved or changed any of your contact information, you will need to get this updated before voting!  In some cases, you MAY be able to register to vote online: [email or web address]

## 2019-09-15 DIAGNOSIS — J3089 Other allergic rhinitis: Secondary | ICD-10-CM | POA: Diagnosis not present

## 2019-09-15 DIAGNOSIS — J3081 Allergic rhinitis due to animal (cat) (dog) hair and dander: Secondary | ICD-10-CM | POA: Diagnosis not present

## 2019-09-15 NOTE — Progress Notes (Signed)
EXP 09/14/20

## 2019-09-16 ENCOUNTER — Other Ambulatory Visit: Payer: Self-pay | Admitting: Internal Medicine

## 2019-09-16 NOTE — Telephone Encounter (Signed)
Please take OTC Vitamin D3 at 2000 units per day, indefinitely.  

## 2019-09-18 ENCOUNTER — Other Ambulatory Visit: Payer: Self-pay | Admitting: Internal Medicine

## 2019-09-18 NOTE — Telephone Encounter (Signed)
Please change to OTC Vitamin D3 at 2000 units per day, indefinitely.  

## 2019-09-28 ENCOUNTER — Ambulatory Visit (INDEPENDENT_AMBULATORY_CARE_PROVIDER_SITE_OTHER): Payer: 59 | Admitting: *Deleted

## 2019-09-28 ENCOUNTER — Other Ambulatory Visit: Payer: Self-pay

## 2019-09-28 DIAGNOSIS — J309 Allergic rhinitis, unspecified: Secondary | ICD-10-CM

## 2019-09-28 NOTE — Progress Notes (Signed)
Immunotherapy   Patient Details  Name: Halayna Blane MRN: [id number] Date of Birth: 1985/10/28  09/28/2019  Presley Raddle started injections for  G-W-T-C-D, M-DM-RW Following schedule: B  Frequency:2 times per week Epi-Pen:Epi-Pen Available  Consent signed and patient instructions given. Patient re-started injections today and received 0.35mL of G-W-T-C-D in the RUA and 0.67mL of M-DM-RW in the LUA. Patient waited 30 minutes and did not experience issues.    Milanya Sunderland Fernandez-Vernon 09/28/2019, 3:29 PM

## 2019-11-19 ENCOUNTER — Other Ambulatory Visit: Payer: Self-pay | Admitting: Internal Medicine

## 2019-12-12 ENCOUNTER — Other Ambulatory Visit: Payer: Self-pay | Admitting: Internal Medicine

## 2019-12-12 DIAGNOSIS — E611 Iron deficiency: Secondary | ICD-10-CM

## 2019-12-12 DIAGNOSIS — E559 Vitamin D deficiency, unspecified: Secondary | ICD-10-CM

## 2019-12-12 DIAGNOSIS — E538 Deficiency of other specified B group vitamins: Secondary | ICD-10-CM

## 2019-12-12 NOTE — Telephone Encounter (Signed)
Ok to hold iron for now  Please ask pt to come to [facility] for f/u labs including cbc, iron, b12 and vit d at her convenience (no fasting needed)

## 2019-12-20 NOTE — Telephone Encounter (Signed)
LDVM for pt to pease go to the lab for her levels to be checked per Dr.John.

## 2020-01-02 ENCOUNTER — Telehealth: Payer: Self-pay | Admitting: Allergy & Immunology

## 2020-01-02 NOTE — Telephone Encounter (Signed)
I called and spoke to patient. Patient says she is having labored breathing, fatigue, coughing, post nasal drip with drainage. Patient is taking mucinex DM and it seems to help. I did advise patient to use her rescue inhaler as needed for shortness of breath. Patient added that she had ben in bed for the past 6 days. Please advise

## 2020-01-02 NOTE — Telephone Encounter (Signed)
Patient called and said that she been sick for 6 days and did rapid covid test yesterday and it is positive. She want to know what to do . [phone].

## 2020-01-02 NOTE — Telephone Encounter (Signed)
Below are my recommendations for COVID-19 treatment.  I think the antibodies would be the most efficacious treatment, but she might be too late in the course to get it.  She should call the number pronto.  Does she have a pulse ox at home?   Recommendations for at home COVID-19 symptoms management:  1. Please continue isolation at home. 2. Call [phone] to see whether you might be eligible for therapeutic antibody infusions (leave your name and they will call you back).  3. If have acute worsening of symptoms please go to ER/urgent care for further evaluation. 4. Check pulse oximetry and if below 90-92% please go to ER. 5. The following supplements MAY help:  ? Vitamin C 500mg  twice a day and Quercetin 250-500 mg twice a day ? Vitamin D3 2000 - 4000 u/day ? B Complex vitamins ? Zinc 75-100 mg/day ? Melatonin 6-10 mg at night (the optimal dose is unknown) ? Aspirin 81mg /day (if no history of bleeding issues)  I see from her last visit that she is fully vaccinated to COVID-19, which likely will serve her well.  I could send in some Decadron for her if she would like she is having trouble breathing.  Salvatore Marvel, MD Allergy and [facility] [place] [facility]

## 2020-01-02 NOTE — Telephone Encounter (Signed)
Patient verbalize understanding and stated she will [facility] and will see if she could get in. In the mean time she would like Dr. Ernst Bowler to send in Decadron to her [place]. If she is not able to get the antibody testing she will try the supplements regimen to see if this will help.

## 2020-01-03 ENCOUNTER — Encounter: Payer: Self-pay | Admitting: Oncology

## 2020-01-03 ENCOUNTER — Telehealth (INDEPENDENT_AMBULATORY_CARE_PROVIDER_SITE_OTHER): Payer: 59 | Admitting: Internal Medicine

## 2020-01-03 ENCOUNTER — Other Ambulatory Visit: Payer: Self-pay | Admitting: Oncology

## 2020-01-03 DIAGNOSIS — U071 COVID-19: Secondary | ICD-10-CM

## 2020-01-03 DIAGNOSIS — J4540 Moderate persistent asthma, uncomplicated: Secondary | ICD-10-CM | POA: Diagnosis not present

## 2020-01-03 DIAGNOSIS — R739 Hyperglycemia, unspecified: Secondary | ICD-10-CM

## 2020-01-03 DIAGNOSIS — F410 Panic disorder [episodic paroxysmal anxiety] without agoraphobia: Secondary | ICD-10-CM | POA: Diagnosis not present

## 2020-01-03 DIAGNOSIS — F4310 Post-traumatic stress disorder, unspecified: Secondary | ICD-10-CM | POA: Insufficient documentation

## 2020-01-03 MED ORDER — ALPRAZOLAM 1 MG PO TABS: 1.0000 mg | ORAL_TABLET | Freq: Two times a day (BID) | ORAL | 0 refills | Status: DC | PRN

## 2020-01-03 MED ORDER — ALBUTEROL SULFATE (2.5 MG/3ML) 0.083% IN NEBU: 2.5000 mg | INHALATION_SOLUTION | Freq: Four times a day (QID) | RESPIRATORY_TRACT | 1 refills | Status: DC | PRN

## 2020-01-03 MED ORDER — DEXAMETHASONE 4 MG PO TABS: ORAL_TABLET | ORAL | 0 refills | Status: DC

## 2020-01-03 MED ORDER — DIPHENOXYLATE-ATROPINE 2.5-0.025 MG PO TABS: 1.0000 | ORAL_TABLET | Freq: Four times a day (QID) | ORAL | 0 refills | Status: DC | PRN

## 2020-01-03 NOTE — Progress Notes (Signed)
I connected by phone with  Mrs. Hermida to discuss the potential use of an new treatment for mild to moderate COVID-19 viral infection in non-hospitalized patients.   This patient is a age/sex that meets the FDA criteria for Emergency Use Authorization of casirivimab\imdevimab.  Has a (+) direct SARS-CoV-2 viral test result 1. Has mild or moderate COVID-19  2. Is ? 32 years of age and weighs ? 40 kg 3. Is NOT hospitalized due to COVID-19 4. Is NOT requiring oxygen therapy or requiring an increase in baseline oxygen flow rate due to COVID-19 5. Is within 10 days of symptom onset 6. Has at least one of the high risk factor(s) for progression to severe COVID-19 and/or hospitalization as defined in EUA. Specific high risk criteria : Past Medical History:  Diagnosis Date  . Allergy   . Anxiety   . Asthma   . Borderline personality disorder ([place])   . Depression   . Eczema   . Former smoker   . Obesity   . PTSD (post-traumatic stress disorder)   . Urticaria   ?  ?    Symptom onset 12/28/19   I have spoken and communicated the following to the patient or parent/caregiver:   1. FDA has authorized the emergency use of casirivimab\imdevimab for the treatment of mild to moderate COVID-19 in adults and pediatric patients with positive results of direct SARS-CoV-2 viral testing who are 45 years of age and older weighing at least 40 kg, and who are at high risk for progressing to severe COVID-19 and/or hospitalization.   2. The significant known and potential risks and benefits of casirivimab\imdevimab, and the extent to which such potential risks and benefits are unknown.   3. Information on available alternative treatments and the risks and benefits of those alternatives, including clinical trials.   4. Patients treated with casirivimab\imdevimab should continue to self-isolate and use infection control measures (e.g., wear mask, isolate, social distance, avoid sharing personal items, clean  and disinfect "high touch" surfaces, and frequent handwashing) according to CDC guidelines.    5. The patient or parent/caregiver has the option to accept or refuse casirivimab\imdevimab .   After reviewing this information with the patient, The patient agreed to proceed with receiving casirivimab\imdevimab infusion and will be provided a copy of the Fact sheet prior to receiving the infusion.Rulon Abide, AGNP-C [phone] ([place])

## 2020-01-03 NOTE — Telephone Encounter (Signed)
Sent in. [place] aware and will talk to the patient.  Salvatore Marvel, MD [facility] and [facility] [place]

## 2020-01-03 NOTE — Addendum Note (Signed)
Addended by: Valentina Shaggy on: 01/03/2020 04:43 PM   Modules accepted: Orders

## 2020-01-04 ENCOUNTER — Ambulatory Visit (HOSPITAL_COMMUNITY)
Admission: RE | Admit: 2020-01-04 | Discharge: 2020-01-04 | Disposition: A | Discharge status: Home / Self Care | Payer: 59 | Source: Ambulatory Visit | Attending: Pulmonary Disease | Admitting: Pulmonary Disease

## 2020-01-04 DIAGNOSIS — U071 COVID-19: Secondary | ICD-10-CM | POA: Diagnosis present

## 2020-01-04 MED ORDER — SODIUM CHLORIDE 0.9 % IV SOLN: INTRAVENOUS | Status: DC | PRN

## 2020-01-04 MED ORDER — EPINEPHRINE 0.3 MG/0.3ML IJ SOAJ: 0.3000 mg | Freq: Once | INTRAMUSCULAR | Status: DC | PRN

## 2020-01-04 MED ORDER — METHYLPREDNISOLONE SODIUM SUCC 125 MG IJ SOLR: 125.0000 mg | Freq: Once | INTRAMUSCULAR | Status: DC | PRN

## 2020-01-04 MED ORDER — SOTROVIMAB 500 MG/8ML IV SOLN
500.0000 mg | Freq: Once | INTRAVENOUS | Status: AC
  Administered 2020-01-04: 500 mg via INTRAVENOUS

## 2020-01-04 MED ORDER — ALBUTEROL SULFATE HFA 108 (90 BASE) MCG/ACT IN AERS: 2.0000 | INHALATION_SPRAY | Freq: Once | RESPIRATORY_TRACT | Status: DC | PRN

## 2020-01-04 MED ORDER — FAMOTIDINE IN NACL 20-0.9 MG/50ML-% IV SOLN: 20.0000 mg | Freq: Once | INTRAVENOUS | Status: DC | PRN

## 2020-01-04 MED ORDER — DIPHENHYDRAMINE HCL 50 MG/ML IJ SOLN: 50.0000 mg | Freq: Once | INTRAMUSCULAR | Status: DC | PRN

## 2020-01-04 NOTE — Progress Notes (Signed)
Diagnosis: COVID-19  Physician: Dr. Patrick Wright  Procedure: [facility] Med: Sotrovimab infusion - Provided patient with sotrovimab fact sheet for patients, parents, and caregivers prior to infusion.   Complications: No immediate complications noted  Discharge: Discharged home    

## 2020-01-04 NOTE — Progress Notes (Signed)
Patient reviewed Fact Sheet for Patients, Parents, and Caregivers for Emergency Use Authorization (EUA) of Sotrovimab for the Treatment of Coronavirus. Patient also reviewed and is agreeable to the estimated cost of treatment. Patient is agreeable to proceed.   

## 2020-01-04 NOTE — Discharge Instructions (Signed)
10 Things You Can Do to Manage Your COVID-19 Symptoms at Home If you have possible or confirmed COVID-19: 1. Stay home from work and school. And stay away from other public places. If you must go out, avoid using any kind of public transportation, ridesharing, or taxis. 2. Monitor your symptoms carefully. If your symptoms get worse, call your healthcare provider immediately. 3. Get rest and stay hydrated. 4. If you have a medical appointment, call the healthcare provider ahead of time and tell them that you have or may have COVID-19. 5. For medical emergencies, call 911 and notify the dispatch personnel that you have or may have COVID-19. 6. Cover your cough and sneezes with a tissue or use the inside of your elbow. 7. Wash your hands often with soap and water for at least 20 seconds or clean your hands with an alcohol-based hand sanitizer that contains at least 60% alcohol. 8. As much as possible, stay in a specific room and away from other people in your home. Also, you should use a separate bathroom, if available. If you need to be around other people in or outside of the home, wear a mask. 9. Avoid sharing personal items with other people in your household, like dishes, towels, and bedding. 10. Clean all surfaces that are touched often, like counters, tabletops, and doorknobs. Use household cleaning sprays or wipes according to the label instructions. cdc.gov/coronavirus 08/03/2018 This information is not intended to replace advice given to you by your health care provider. Make sure you discuss any questions you have with your health care provider. Document Revised: 01/05/2019 Document Reviewed: 01/05/2019 Elsevier Patient Education  2020 Elsevier Inc. What types of side effects do monoclonal antibody drugs cause?  Common side effects  In general, the more common side effects caused by monoclonal antibody drugs include: . Allergic reactions, such as hives or itching . Flu-like signs and  symptoms, including chills, fatigue, fever, and muscle aches and pains . Nausea, vomiting . Diarrhea . Skin rashes . Low blood pressure   The CDC is recommending patients who receive monoclonal antibody treatments wait at least 90 days before being vaccinated.  Currently, there are no data on the safety and efficacy of mRNA COVID-19 vaccines in persons who received monoclonal antibodies or convalescent plasma as part of COVID-19 treatment. Based on the estimated half-life of such therapies as well as evidence suggesting that reinfection is uncommon in the 90 days after initial infection, vaccination should be deferred for at least 90 days, as a precautionary measure until additional information becomes available, to avoid interference of the antibody treatment with vaccine-induced immune responses. If you have any questions or concerns after the infusion please call the Advanced Practice Provider on call at [phone]. This number is ONLY intended for your use regarding questions or concerns about the infusion post-treatment side-effects.  Please do not provide this number to others for use. For return to work notes please contact your primary care provider.   If someone you know is interested in receiving treatment please have them call the COVID hotline at [phone].   

## 2020-01-07 ENCOUNTER — Encounter: Payer: Self-pay | Admitting: Internal Medicine

## 2020-01-07 DIAGNOSIS — U071 COVID-19: Secondary | ICD-10-CM | POA: Insufficient documentation

## 2020-01-07 NOTE — Assessment & Plan Note (Signed)
stable overall by history and exam, recent data reviewed with pt, and pt to continue medical treatment as before,  to f/u any worsening symptoms or concerns  

## 2020-01-07 NOTE — Progress Notes (Signed)
[name], female   DOB: 19-Oct-1985, 34 y.o.   MRN: [id number]  Virtual Visit via Video Note  I connected with Presley Raddle on Jan 03 2020 at  3:20 PM EST by a video enabled telemedicine application and verified that I am speaking with the correct person using two identifiers.  Location fo all participants today Patient: at home Provider: at office   I discussed the limitations of evaluation and management by telemedicine and the availability of in person appointments. The patient expressed understanding and agreed to proceed.  History of Present Illness: Here after URI symptoms starting ov 25,  taste and smell ok but felt poorly by nov 27, covid testing + on nov 29, also with coug, weakness, congestion, HA, diarrhea, chills and feverish.  S/p vaccaination x 05 July 2019.  Seen per allergist and started decadron, as well as now s/p monoclonal ab.  Asks for lomotil prn diarrhea.  Also refill xanax prn anxiety, and albut neb prn asthma symptoms, but no recent wheezing.  No other new complaints   Pt denies polydipsia, polyuria  Past Medical History:  Diagnosis Date  . Allergy   . Anxiety   . Asthma   . Borderline personality disorder ([place])   . Depression   . Eczema   . Former smoker   . Obesity   . PTSD (post-traumatic stress disorder)   . Urticaria    Past Surgical History:  Procedure Laterality Date  . Thumb surgery Left     reports that she quit smoking about 4 years ago. She has a 4.50 pack-year smoking history. She has never used smokeless tobacco. She reports current alcohol use of about 8.0 standard drinks of alcohol per week. She reports current drug use. Frequency: 4.00 times per week. Drug: Marijuana. family history includes Allergic rhinitis in her paternal grandmother; Alzheimer's disease in her maternal grandmother; Asthma in her paternal grandmother; Healthy in her father and mother; Skin cancer in her paternal grandmother. Allergies   Allergen Reactions  . Cogentin [Benztropine]   . Other     Opiates- history of dependency    Current Outpatient Medications on File Prior to Visit  Medication Sig Dispense Refill  . albuterol (VENTOLIN HFA) 108 (90 Base) MCG/ACT inhaler USE 2 PUFFS EVERY 6 HOURS AS NEEDED FOR WHEEZING OR SHORTNESS OF BREATH 18 g 1  . budesonide-formoterol (SYMBICORT) 160-4.5 MCG/ACT inhaler Inhale 2 puffs into the lungs 2 (two) times daily. 10.2 g 5  . dexamethasone (DECADRON) 4 MG tablet Take four tablets on day 1. Repeat in 48 hours. 8 tablet 0  . FLUoxetine (PROZAC) 40 MG capsule Take 1 capsule (40 mg total) by mouth daily. 30 capsule 0  . fluticasone (FLONASE) 50 MCG/ACT nasal spray Place 2 sprays into both nostrils daily. 16 g 5  . gabapentin (NEURONTIN) 300 MG capsule Take 1 capsule (300 mg total) by mouth 3 (three) times daily. 90 capsule 2  . hydrOXYzine (ATARAX/VISTARIL) 50 MG tablet Take 1 tablet (50 mg total) by mouth in the morning and at bedtime. 1 tablet in the morning. 2 tablets at night 60 tablet 5  . iron polysaccharides (NU-IRON) 150 MG capsule Take 1 capsule (150 mg total) by mouth daily. 90 capsule 1  . montelukast (SINGULAIR) 10 MG tablet Take 1 tablet (10 mg total) by mouth at bedtime. 30 tablet 5  . norethindrone-ethinyl estradiol (OVCON-50) 1-50 MG-MCG tablet Take 1 tablet by mouth daily.    [name] nystatin (MYCOSTATIN/NYSTOP) powder APPLY TO  THE AFFECTED AREA TWICE DAILY 60 g 5  . nystatin-triamcinolone ointment (MYCOLOG) APPLY TO AFFECTED AREA TWICE DAILY. 60 g 0  . omeprazole (PRILOSEC) 40 MG capsule Take 1 capsule (40 mg total) by mouth daily. 30 capsule 5  . paliperidone (INVEGA) 6 MG 24 hr tablet Take 1 tablet (6 mg total) by mouth daily. 7 tablet 0  . PORTIA-28 0.15-30 MG-MCG tablet Take 1 tablet by mouth daily.    . vitamin B-12 (CYANOCOBALAMIN) 1000 MCG tablet Take 1 tablet (1,000 mcg total) by mouth daily. 90 tablet 1  . Vitamin D, Ergocalciferol, (DRISDOL) 1.25 MG (50000 UNIT)  CAPS capsule Take 1 capsule (50,000 Units total) by mouth every 7 (seven) days. 12 capsule 0   No current facility-administered medications on file prior to visit.    Observations/Objective: Alert, NAD, appropriate mood and affect, resps normal, cn 2-12 intact, moves all 4s, no visible rash or swelling Lab Results  Component Value Date   WBC 9.1 06/02/2019   HGB 13.9 06/02/2019   HCT 42.6 06/02/2019   PLT 320.0 06/02/2019   GLUCOSE 101 (H) 06/02/2019   CHOL 190 06/02/2019   TRIG 74.0 06/02/2019   HDL 54.00 06/02/2019   LDLCALC 121 (H) 06/02/2019   ALT 24 06/02/2019   AST 26 06/02/2019   NA 137 06/02/2019   K 4.5 06/02/2019   CL 103 06/02/2019   CREATININE 0.94 06/02/2019   BUN 7 06/02/2019   CO2 28 06/02/2019   TSH 0.92 06/02/2019   HGBA1C 5.6 06/02/2019   Assessment and Plan: See notes  Follow Up Instructions: See notes   I discussed the assessment and treatment plan with the patient. The patient was provided an opportunity to ask questions and all were answered. The patient agreed with the plan and demonstrated an understanding of the instructions.   The patient was advised to call back or seek an in-person evaluation if the symptoms worsen or if the condition fails to improve as anticipated.   Cathlean Cower, MD

## 2020-01-07 NOTE — Assessment & Plan Note (Addendum)
Ok for lomotil prn diarrea,  to f/u any worsening symptoms or concerns  I spent 31 minutes in preparing to see the patient by review of recent labs, imaging and procedures, obtaining and reviewing separately obtained history, communicating with the patient and family or caregiver, ordering medications, tests or procedures, and documenting clinical information in the EHR including the differential Dx, treatment, and any further evaluation and other management of covid infection, asthma, anxiety, hyperglycemia

## 2020-01-07 NOTE — Patient Instructions (Signed)
Please take all new medication as prescribed 

## 2020-01-07 NOTE — Assessment & Plan Note (Signed)
For xanax prn,  to f/u any worsening symptoms or concerns

## 2020-01-15 ENCOUNTER — Other Ambulatory Visit: Payer: Self-pay | Admitting: Internal Medicine

## 2020-02-16 ENCOUNTER — Other Ambulatory Visit: Payer: Self-pay | Admitting: Internal Medicine

## 2020-03-11 ENCOUNTER — Other Ambulatory Visit: Payer: Self-pay | Admitting: Allergy & Immunology

## 2020-03-11 ENCOUNTER — Other Ambulatory Visit: Payer: Self-pay | Admitting: Internal Medicine

## 2020-03-23 ENCOUNTER — Other Ambulatory Visit: Payer: Self-pay | Admitting: Allergy & Immunology

## 2020-04-07 ENCOUNTER — Other Ambulatory Visit: Payer: Self-pay | Admitting: Internal Medicine

## 2020-04-17 ENCOUNTER — Other Ambulatory Visit: Payer: Self-pay | Admitting: Allergy & Immunology

## 2020-04-26 DIAGNOSIS — F332 Major depressive disorder, recurrent severe without psychotic features: Secondary | ICD-10-CM | POA: Insufficient documentation

## 2020-05-07 ENCOUNTER — Other Ambulatory Visit: Payer: Self-pay | Admitting: Allergy & Immunology

## 2020-05-07 NOTE — Telephone Encounter (Signed)
Patient is in between homes and says it would be difficult for her to come in office. She is wondering if she can do a tele-visit that way she can be seen and also get refills on her medication.

## 2020-05-09 NOTE — Telephone Encounter (Signed)
Sure!  I have several openings today for office visits.

## 2020-05-09 NOTE — Telephone Encounter (Signed)
Patient is scheduled for a Tele-visit on 05/10/20 with Dr. Ernst Bowler.

## 2020-05-10 ENCOUNTER — Ambulatory Visit (INDEPENDENT_AMBULATORY_CARE_PROVIDER_SITE_OTHER): Payer: 59 | Admitting: Allergy & Immunology

## 2020-05-10 ENCOUNTER — Other Ambulatory Visit: Payer: Self-pay

## 2020-05-10 ENCOUNTER — Encounter: Payer: Self-pay | Admitting: Allergy & Immunology

## 2020-05-10 DIAGNOSIS — J302 Other seasonal allergic rhinitis: Secondary | ICD-10-CM

## 2020-05-10 DIAGNOSIS — J3089 Other allergic rhinitis: Secondary | ICD-10-CM | POA: Diagnosis not present

## 2020-05-10 DIAGNOSIS — J4540 Moderate persistent asthma, uncomplicated: Secondary | ICD-10-CM | POA: Diagnosis not present

## 2020-05-10 DIAGNOSIS — R490 Dysphonia: Secondary | ICD-10-CM | POA: Diagnosis not present

## 2020-05-10 MED ORDER — ALBUTEROL SULFATE HFA 108 (90 BASE) MCG/ACT IN AERS
INHALATION_SPRAY | RESPIRATORY_TRACT | 1 refills | Status: DC
Start: 2020-05-10 — End: 2020-12-19

## 2020-05-10 MED ORDER — BUDESONIDE-FORMOTEROL FUMARATE 160-4.5 MCG/ACT IN AERO: 2.0000 | INHALATION_SPRAY | Freq: Two times a day (BID) | RESPIRATORY_TRACT | 5 refills | Status: DC

## 2020-05-10 MED ORDER — MONTELUKAST SODIUM 10 MG PO TABS: 1.0000 | ORAL_TABLET | Freq: Every day | ORAL | 5 refills | Status: DC

## 2020-05-10 MED ORDER — FLUTICASONE PROPIONATE 50 MCG/ACT NA SUSP: 2.0000 | Freq: Every day | NASAL | 5 refills | Status: DC

## 2020-05-10 MED ORDER — ALBUTEROL SULFATE (2.5 MG/3ML) 0.083% IN NEBU: 2.5000 mg | INHALATION_SOLUTION | Freq: Four times a day (QID) | RESPIRATORY_TRACT | 1 refills | Status: DC | PRN

## 2020-05-10 MED ORDER — OMEPRAZOLE 40 MG PO CPDR: 1.0000 | DELAYED_RELEASE_CAPSULE | Freq: Two times a day (BID) | ORAL | 5 refills | Status: DC
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
## 2020-05-15 ENCOUNTER — Other Ambulatory Visit: Payer: Self-pay | Admitting: *Deleted

## 2020-05-15 MED ORDER — ALBUTEROL SULFATE HFA 108 (90 BASE) MCG/ACT IN AERS: 2.0000 | INHALATION_SPRAY | Freq: Four times a day (QID) | RESPIRATORY_TRACT | 1 refills | Status: DC | PRN

## 2020-05-28 ENCOUNTER — Other Ambulatory Visit: Payer: Self-pay | Admitting: Internal Medicine

## 2020-06-26 ENCOUNTER — Other Ambulatory Visit: Payer: Self-pay

## 2020-06-26 ENCOUNTER — Emergency Department (HOSPITAL_COMMUNITY)
Admission: EM | Admit: 2020-06-26 | Discharge: 2020-06-26 | Disposition: A | Discharge status: Home / Self Care | Payer: 59 | Attending: Emergency Medicine | Admitting: Emergency Medicine

## 2020-06-26 ENCOUNTER — Encounter (HOSPITAL_COMMUNITY): Payer: Self-pay | Admitting: *Deleted

## 2020-06-26 DIAGNOSIS — R1030 Lower abdominal pain, unspecified: Secondary | ICD-10-CM | POA: Insufficient documentation

## 2020-06-26 DIAGNOSIS — Z87891 Personal history of nicotine dependence: Secondary | ICD-10-CM | POA: Diagnosis not present

## 2020-06-26 DIAGNOSIS — Z8616 Personal history of COVID-19: Secondary | ICD-10-CM | POA: Diagnosis not present

## 2020-06-26 DIAGNOSIS — Z7951 Long term (current) use of inhaled steroids: Secondary | ICD-10-CM | POA: Diagnosis not present

## 2020-06-26 DIAGNOSIS — J45909 Unspecified asthma, uncomplicated: Secondary | ICD-10-CM | POA: Diagnosis not present

## 2020-06-26 DIAGNOSIS — K625 Hemorrhage of anus and rectum: Secondary | ICD-10-CM | POA: Diagnosis present

## 2020-06-26 LAB — COMPREHENSIVE METABOLIC PANEL
ALT: 22 U/L (ref 0–44)
AST: 26 U/L (ref 15–41)
Albumin: 3.9 g/dL (ref 3.5–5.0)
Alkaline Phosphatase: 92 U/L (ref 38–126)
Anion gap: 11 (ref 5–15)
BUN: 6 mg/dL (ref 6–20)
CO2: 23 mmol/L (ref 22–32)
Calcium: 9.1 mg/dL (ref 8.9–10.3)
Chloride: 105 mmol/L (ref 98–111)
Creatinine, Ser: 0.58 mg/dL (ref 0.44–1.00)
GFR, Estimated: >60 mL/min (ref >60)
Glucose, Bld: 104 mg/dL — ABNORMAL HIGH (ref 70–99)
Potassium: 3.5 mmol/L (ref 3.5–5.1)
Sodium: 139 mmol/L (ref 135–145)
Total Bilirubin: 0.6 mg/dL (ref 0.3–1.2)
Total Protein: 7.6 g/dL (ref 6.5–8.1)

## 2020-06-26 LAB — I-STAT BETA HCG BLOOD, ED (MC, WL, AP ONLY): I-stat hCG, quantitative: <5 m[IU]/mL (ref <5)

## 2020-06-26 LAB — CBC
HCT: 45.6 % (ref 36.0–46.0)
Hemoglobin: 14.8 g/dL (ref 12.0–15.0)
MCH: 28.5 pg (ref 26.0–34.0)
MCHC: 32.5 g/dL (ref 30.0–36.0)
MCV: 87.9 fL (ref 80.0–100.0)
Platelets: 334 10*3/uL (ref 150–400)
RBC: 5.19 MIL/uL — ABNORMAL HIGH (ref 3.87–5.11)
RDW: 13.3 % (ref 11.5–15.5)
WBC: 9.6 10*3/uL (ref 4.0–10.5)
nRBC: 0 % (ref 0.0–0.2)

## 2020-06-26 NOTE — Discharge Instructions (Signed)
Please read and follow all provided instructions.  Your diagnoses today include:  1. Rectal bleeding     Tests performed today include:  Blood counts and electrolytes - were normal today  Vital signs. See below for your results today.   Medications prescribed:   None  Take any prescribed medications only as directed.  Home care instructions:  Follow any educational materials contained in this packet.  BE VERY CAREFUL not to take multiple medicines containing Tylenol (also called acetaminophen). Doing so can lead to an overdose which can damage your liver and cause liver failure and possibly death.   Follow-up instructions: Please follow-up with your primary care provider or the GI referral in the next 7 days for further evaluation of your symptoms.   Return instructions:   Please return to the Emergency Department if you experience worsening symptoms.   Return if you have persistent bleeding and develop chest pain, shortness of breath, lightheadedness or you pass out.  Please return if you have any other emergent concerns.  Additional Information:  Your vital signs today were: BP 125/79   Pulse 72   Temp 97.6 F (36.4 C) (Oral)   Resp 18   Ht 5\' 2"  (1.575 m)   Wt 101 kg   SpO2 100%   BMI 40.71 kg/m  If your blood pressure (BP) was elevated above 135/85 this visit, please have this repeated by your doctor within one month. --------------

## 2020-06-26 NOTE — ED Provider Notes (Signed)
[facility] DEPT Provider Note   CSN: [id number] Arrival date & time: 06/26/20  8756     History Chief Complaint  Patient presents with  . Rectal Bleeding    [name] is a 35 y.o. female.  Patient with reported history of bleeding internal hemorrhoids presents the emergency department for evaluation of feeling "woozy" in the setting of multiple episodes of bright red rectal bleeding.  She states that the bleeding is worse over the past 4 days.  She has noticed this during bowel movements 2-3 times a day.  She states that she has been working long hours at a new job over the past 2 months and thinks she has lost about 23 pounds over the past month or so.  She does state that she is more active at her current job and was more sedentary previously.  No chest pain or shortness of breath.  She has some associated lower abdominal cramps.  No urinary symptoms.  No other bleeding, easy bruising.  Denies vaginal bleeding or blood in the urine at present.  She is concerned that she has lost a lot of blood due to the amount of bleeding that she has seen (red blood filling up the toilet bowl), trouble regulating her body temperature, lightheadedness and fatigue. The onset of this condition was acute. The course is intermittent. Aggravating factors: none. Alleviating factors: none.          Past Medical History:  Diagnosis Date  . Allergy   . Anxiety   . Asthma   . Borderline personality disorder ([place])   . Depression   . Eczema   . Former smoker   . Obesity   . PTSD (post-traumatic stress disorder)   . Urticaria     Patient Active Problem List   Diagnosis Date Noted  . COVID-19 virus infection 01/07/2020  . Panic anxiety syndrome 01/03/2020  . Allergic asthma 01/13/2018  . Rash 01/13/2018  . Allergic rhinitis 03/12/2017  . Hyperglycemia 01/01/2017  . Allergy   . Former smoker   . Anxiety 10/26/2016  . Bronchopneumonia 10/13/2016  . Acute  asthma exacerbation 10/13/2016  . Hypokalemia 10/13/2016  . Encounter for well adult exam with abnormal findings 04/17/2016  . Class 2 obesity due to excess calories without serious comorbidity with body mass index (BMI) of 37.0 to 37.9 in adult 04/17/2016  . Asthma 03/13/2016  . Borderline personality disorder ([place]) 03/13/2016  . Eczema 03/13/2016  . Cough 01/28/2016  . Antibiotic-associated diarrhea 01/28/2016  . Mood disorder ([place]) 03/04/2013    Past Surgical History:  Procedure Laterality Date  . Thumb surgery Left      OB History   No obstetric history on file.     Family History  Problem Relation Age of Onset  . Healthy Mother   . Healthy Father   . Alzheimer's disease Maternal Grandmother   . Skin cancer Paternal Grandmother   . Allergic rhinitis Paternal Grandmother   . Asthma Paternal Grandmother   . Angioedema Neg Hx   . Eczema Neg Hx   . Urticaria Neg Hx     Social History   Tobacco Use  . Smoking status: Former Smoker    Packs/day: 0.50    Years: 9.00    Pack years: 4.50    Quit date: 12/04/2015    Years since quitting: 4.5  . Smokeless tobacco: Never Used  Vaping Use  . Vaping Use: Former  Substance Use Topics  . Alcohol use: Yes  Alcohol/week: 8.0 standard drinks    Types: 8 Cans of beer per week    Comment: occ  . Drug use: Yes    Frequency: 4.0 times per week    Types: Marijuana    Comment: Last smoked this morning-03/09/19    Home Medications Prior to Admission medications   Medication Sig Start Date End Date Taking? Authorizing Provider  albuterol (PROVENTIL) (2.5 MG/3ML) 0.083% nebulizer solution Take 3 mLs (2.5 mg total) by nebulization every 6 (six) hours as needed for wheezing or shortness of breath. 05/10/20   Valentina Shaggy, MD  albuterol (VENTOLIN HFA) 108 (90 Base) MCG/ACT inhaler USE 2 PUFFS EVERY 6 HOURS AS NEEDED FOR WHEEZING OR SHORTNESS OF BREATH 05/10/20   Valentina Shaggy, MD  albuterol (VENTOLIN HFA) 108 (90 Base)  MCG/ACT inhaler Inhale 2 puffs into the lungs every 6 (six) hours as needed for wheezing or shortness of breath. 05/15/20   Valentina Shaggy, MD  ALPRAZolam Duanne Moron) 1 MG tablet Take 1 tablet (1 mg total) by mouth 2 (two) times daily as needed for anxiety. 01/03/20   Biagio Borg, MD  budesonide-formoterol [facility]) 160-4.5 MCG/ACT inhaler Inhale 2 puffs into the lungs 2 (two) times daily. 05/10/20   Valentina Shaggy, MD  diphenoxylate-atropine (LOMOTIL) 2.5-0.025 MG tablet Take 1 tablet by mouth 4 (four) times daily as needed for diarrhea or loose stools. 01/03/20   Biagio Borg, MD  FLUoxetine (PROZAC) 40 MG capsule Take 1 capsule (40 mg total) by mouth daily. 10/15/16   Regalado, Belkys A, MD  fluticasone (FLONASE) 50 MCG/ACT nasal spray Place 2 sprays into both nostrils daily. 05/10/20   Valentina Shaggy, MD  gabapentin (NEURONTIN) 300 MG capsule Take 1 capsule (300 mg total) by mouth 3 (three) times daily. 05/01/14   Wendie Agreste, MD  hydrOXYzine (ATARAX/VISTARIL) 50 MG tablet Take 1 tablet (50 mg total) by mouth in the morning and at bedtime. 1 tablet in the morning. 2 tablets at night 09/07/19   Valentina Shaggy, MD  montelukast (SINGULAIR) 10 MG tablet Take 1 tablet (10 mg total) by mouth at bedtime. 05/10/20   Valentina Shaggy, MD  nystatin (MYCOSTATIN/NYSTOP) powder APPLY TO THE AFFECTED AREA TWICE DAILY 06/02/19   Biagio Borg, MD  nystatin-triamcinolone ointment (MYCOLOG) APPLY TO AFFECTED AREA TWICE DAILY. 05/28/20   Biagio Borg, MD  omeprazole (PRILOSEC) 40 MG capsule Take 1 capsule (40 mg total) by mouth in the morning and at bedtime. 05/10/20 06/09/20  Valentina Shaggy, MD  PORTIA-28 0.15-30 MG-MCG tablet Take 1 tablet by mouth daily. 05/27/19   [provider]  prazosin (MINIPRESS) 1 MG capsule Take 2 mg by mouth 2 (two) times daily as needed. 05/02/20   [provider]  traZODone (DESYREL) 100 MG tablet Take 100 mg by mouth at bedtime. 05/02/20    [provider]  vitamin B-12 (CYANOCOBALAMIN) 1000 MCG tablet Take 1 tablet (1,000 mcg total) by mouth daily. 06/23/19   Biagio Borg, MD  Vitamin D, Ergocalciferol, (DRISDOL) 1.25 MG (50000 UNIT) CAPS capsule Take 1 capsule (50,000 Units total) by mouth every 7 (seven) days. 06/02/19   Biagio Borg, MD    Allergies    Benztropine and Other  Review of Systems   Review of Systems  Constitutional: Positive for fatigue. Negative for fever.  HENT: Negative for rhinorrhea and sore throat.   Eyes: Negative for redness.  Respiratory: Negative for cough.   Cardiovascular: Negative for chest  pain.  Gastrointestinal: Positive for abdominal pain (lower cramping) and blood in stool. Negative for constipation, diarrhea, nausea and vomiting.  Endocrine: Positive for cold intolerance and heat intolerance.  Genitourinary: Negative for dysuria, frequency, hematuria and urgency.  Musculoskeletal: Negative for myalgias.  Skin: Negative for rash.  Neurological: Positive for weakness (generalized). Negative for headaches.    Physical Exam Updated Vital Signs BP 131/83 (BP Location: Left Arm)   Pulse 75   Temp 97.6 F (36.4 C) (Oral)   Resp 18   Ht 5\' 2"  (1.575 m)   Wt 101 kg   SpO2 100%   BMI 40.71 kg/m   Physical Exam Vitals and nursing note reviewed. Exam conducted with a chaperone present.  Constitutional:      General: She is not in acute distress.    Appearance: She is well-developed.  HENT:     Head: Normocephalic and atraumatic.     Right Ear: External ear normal.     Left Ear: External ear normal.     Nose: Nose normal.  Eyes:     Conjunctiva/sclera: Conjunctivae normal.  Cardiovascular:     Rate and Rhythm: Normal rate and regular rhythm.     Heart sounds: No murmur heard.   Pulmonary:     Effort: No respiratory distress.     Breath sounds: No wheezing, rhonchi or rales.  Abdominal:     Palpations: Abdomen is soft.     Tenderness: There is no abdominal  tenderness. There is no guarding or rebound.  Genitourinary:    Exam position: Knee-chest position.     Rectum: No tenderness or external hemorrhoid.     Comments: No active bleeding noted. No visible hemorrhoids. Musculoskeletal:     Cervical back: Normal range of motion and neck supple.     Right lower leg: No edema.     Left lower leg: No edema.  Skin:    General: Skin is warm and dry.     Findings: No rash.  Neurological:     General: No focal deficit present.     Mental Status: She is alert. Mental status is at baseline.     Motor: No weakness.  Psychiatric:        Mood and Affect: Mood normal.     ED Results / Procedures / Treatments   Labs (all labs ordered are listed, but only abnormal results are displayed) Labs Reviewed  CBC - Abnormal; Notable for the following components:      Result Value   RBC 5.19 (*)    All other components within normal limits  COMPREHENSIVE METABOLIC PANEL - Abnormal; Notable for the following components:   Glucose, Bld 104 (*)    All other components within normal limits  I-STAT BETA HCG BLOOD, ED ([facility], [facility], [facility] ONLY)    EKG None  Radiology No results found.  Procedures Procedures   Medications Ordered in ED Medications - No data to display  ED Course  I have reviewed the triage vital signs and the nursing notes.  Pertinent labs & imaging results that were available during my care of the patient were reviewed by me and considered in my medical decision making (see chart for details).  Patient seen and examined. Work-up initiated.   Vital signs reviewed and are as follows: BP 131/83 (BP Location: Left Arm)   Pulse 75   Temp 97.6 F (36.4 C) (Oral)   Resp 18   Ht 5\' 2"  (1.575 m)   Wt 101 kg  SpO2 100%   BMI 40.71 kg/m   1:00 PM Pt updated on results.  External rectal exam performed with nurse tech chaperone.  At this point, patient is stable for discharge.  Vital signs are normal.  Will give GI referral.  Patient  requests work note.     MDM Rules/Calculators/A&P                          Lower GI bleeding suspected given passage of heme positive stool, bright red blood per rectum, hematochezia (maroon stool), clots noted in stool, red blood on toilet paper or in toilet bowl, no melanotic stools, presentation not consistent with a large upper GI bleed.  The following differential diagnoses were considered for this patient's lower GI bleed: *Hemorrhoids - can be painless, blood noted on toilet paper *Anal fissure - tearing pain with defecation, small amount of blood *Colonic polyp - usually painless *Proctitis - usually associated with passage of mucus and diarrhea *IBD - presents with crampy abdominal pain, tenesmus, fever *Infectious diarrhea - usually abrupt onset *Diverticulosis - large volume of painless bleeding, usually >40yo *Mesenteric ischemia - usually >50yo, underlying cardiovascular disease, pain out of proportion *Colon cancer *Arteriovenous malformation  None of the following red flags identified or suspected: *Abnormal vital signs *Symptoms suggestive of malignancy such as constitutional symptoms (fever, weight loss), anemia, or change in frequency, caliber or consistency of stools * Family history of colon cancer  The patient appears reasonably screened and/or stabilized for discharge and I doubt any other medical condition or other emergency medical conditions requiring further screening, evaluation, or treatment in the ED at this time prior to discharge.  Follow-up: Age less than 37, source of bleeding not identified, likely hemorrhoidal based on history -- unlikely to be colon cancer or other emergent etiology. GI referral given for further evaluation, possible sigmoidoscopy.   Final Clinical Impression(s) / ED Diagnoses Final diagnoses:  Rectal bleeding    Rx / DC Orders ED Discharge Orders    None       Carlisle Cater, PA-C 06/26/20 1303    Blanchie Dessert,  MD 06/26/20 1527

## 2020-06-26 NOTE — ED Triage Notes (Signed)
Pt reports large amounts of blood while having BM x 4 days. Pt was told she had hemorrhoids and to be evaluated if bleeding did not get better. Pt began feeling lightheaded today.

## 2020-06-28 ENCOUNTER — Encounter: Payer: Self-pay | Admitting: Internal Medicine

## 2020-06-28 ENCOUNTER — Other Ambulatory Visit: Payer: Self-pay | Admitting: Internal Medicine

## 2020-06-28 ENCOUNTER — Ambulatory Visit (INDEPENDENT_AMBULATORY_CARE_PROVIDER_SITE_OTHER): Payer: 59 | Admitting: Internal Medicine

## 2020-06-28 ENCOUNTER — Other Ambulatory Visit: Payer: Self-pay

## 2020-06-28 VITALS — BP 102/78 | HR 107 | Temp 99.0°F | Ht 62.0 in | Wt 223.0 lb

## 2020-06-28 DIAGNOSIS — L732 Hidradenitis suppurativa: Secondary | ICD-10-CM

## 2020-06-28 DIAGNOSIS — Z1159 Encounter for screening for other viral diseases: Secondary | ICD-10-CM | POA: Diagnosis not present

## 2020-06-28 DIAGNOSIS — L03317 Cellulitis of buttock: Secondary | ICD-10-CM | POA: Diagnosis not present

## 2020-06-28 MED ORDER — DOXYCYCLINE HYCLATE 100 MG PO TABS: 100.0000 mg | ORAL_TABLET | Freq: Two times a day (BID) | ORAL | 0 refills | Status: DC

## 2020-06-28 NOTE — Patient Instructions (Addendum)
Take doxycycline twice daily for your infections.     A referral was ordered for dermatology.       Hidradenitis Suppurativa Hidradenitis suppurativa is a long-term (chronic) skin disease. It is similar to a severe form of acne, but it affects areas of the body where acne would be unusual, especially areas of the body where skin rubs against skin and becomes moist. These include:  Underarms.  Groin.  Genital area.  Buttocks.  Upper thighs.  Breasts. Hidradenitis suppurativa may start out as small lumps or pimples caused by blocked sweat glands or hair follicles. Pimples may develop into deep sores that break open (rupture) and drain pus. Over time, affected areas of skin may thicken and become scarred. This condition is rare and does not spread from person to person (non-contagious). What are the causes? The exact cause of this condition is not known. It may be related to:  Female and female hormones.  An overactive disease-fighting system (immune system). The immune system may over-react to blocked hair follicles or sweat glands and cause swelling and pus-filled sores. What increases the risk? You are more likely to develop this condition if you:  Are female.  Are 41-64 years old.  Have a family history of hidradenitis suppurativa.  Have a personal history of acne.  Are overweight.  Smoke.  Take the medicine lithium. What are the signs or symptoms? The first symptoms are usually painful bumps in the skin, similar to pimples. The condition may get worse over time (progress), or it may only cause mild symptoms. If the disease progresses, symptoms may include:  Skin bumps getting bigger and growing deeper into the skin.  Bumps rupturing and draining pus.  Itchy, infected skin.  Skin getting thicker and scarred.  Tunnels under the skin (fistulas) where pus drains from a bump.  Pain during daily activities, such as pain during walking if your groin area is  affected.  Emotional problems, such as stress or depression. This condition may affect your appearance and your ability or willingness to wear certain clothes or do certain activities. How is this diagnosed? This condition is diagnosed by a health care provider who specializes in skin diseases (dermatologist). You may be diagnosed based on:  Your symptoms and medical history.  A physical exam.  Testing a pus sample for infection.  Blood tests. How is this treated? Your treatment will depend on how severe your symptoms are. The same treatment will not work for everybody with this condition. You may need to try several treatments to find what works best for you. Treatment may include:  Cleaning and bandaging (dressing) your wounds as needed.  Lifestyle changes, such as new skin care routines.  Taking medicines, such as: ? Antibiotics. ? Acne medicines. ? Medicines to reduce the activity of the immune system. ? A diabetes medicine (metformin). ? Birth control pills, for women. ? Steroids to reduce swelling and pain.  Working with a mental health care provider, if you experience emotional distress due to this condition. If you have severe symptoms that do not get better with medicine, you may need surgery. Surgery may involve:  Using a laser to clear the skin and remove hair follicles.  Opening and draining deep sores.  Removing the areas of skin that are diseased and scarred. Follow these instructions at home: Medicines  Take over-the-counter and prescription medicines only as told by your health care provider.  If you were prescribed an antibiotic medicine, take it as told by your health  care provider. Do not stop taking the antibiotic even if your condition improves.   Skin care  If you have open wounds, cover them with a clean dressing as told by your health care provider. Keep wounds clean by washing them gently with soap and water when you bathe.  Do not shave the  areas where you get hidradenitis suppurativa.  Do not wear deodorant.  Wear loose-fitting clothes.  Try to avoid getting overheated or sweaty. If you get sweaty or wet, change into clean, dry clothes as soon as you can.  To help relieve pain and itchiness, cover sore areas with a warm, clean washcloth (warm compress) for 5-10 minutes as often as needed.  If told by your health care provider, take a bleach bath twice a week: ? Fill your bathtub halfway with water. ? Pour in  cup of unscented household bleach. ? Soak in the tub for 5-10 minutes. ? Only soak from the neck down. Avoid water on your face and hair. ? Shower to rinse off the bleach from your skin. General instructions  Learn as much as you can about your disease so that you have an active role in your treatment. Work closely with your health care provider to find treatments that work for you.  If you are overweight, work with your health care provider to lose weight as recommended.  Do not use any products that contain nicotine or tobacco, such as cigarettes and e-cigarettes. If you need help quitting, ask your health care provider.  If you struggle with living with this condition, talk with your health care provider or work with a mental health care provider as recommended.  Keep all follow-up visits as told by your health care provider. This is important. Where to find more information  Hidradenitis [place].: https://www.hs-foundation.org/  American Academy of Dermatology: http://www.nguyen-hutchinson.com/ Contact a health care provider if you have:  A flare-up of hidradenitis suppurativa.  A fever or chills.  Trouble controlling your symptoms at home.  Trouble doing your daily activities because of your symptoms.  Trouble dealing with emotional problems related to your condition. Summary  Hidradenitis suppurativa is a long-term (chronic) skin disease. It is similar to a severe form of acne, but it  affects areas of the body where acne would be unusual.  The first symptoms are usually painful bumps in the skin, similar to pimples. The condition may only cause mild symptoms, or it may get worse over time (progress).  If you have open wounds, cover them with a clean dressing as told by your health care provider. Keep wounds clean by washing them gently with soap and water when you bathe.  Besides skin care, treatment may include medicines, laser treatment, and surgery. This information is not intended to replace advice given to you by your health care provider. Make sure you discuss any questions you have with your health care provider. Document Revised: 11/14/2019 Document Reviewed: 11/14/2019 Elsevier Patient Education  2021 [employer].

## 2020-06-28 NOTE — Progress Notes (Signed)
Subjective:    [name], female    DOB: [birth year], 35 y.o.   MRN: [id number]  HPI The patient is here for an acute visit.   She has two infected cuts - one on right posterior thigh and one on her left buttock.  She also has a small cut on her left leg that is painful and red.  She admits to picking at the acne on her buttock region, which is why it got infected.  She does state some pus discharge from the infected lesions.  She states low-grade fever.  She also has cysts under both armpits.  She has 2 in her right axilla, 1 of which has been there since she was in high school.  She has 4 cyst in the left axilla.  They do hurt.  She has not had any drainage.  She has never seen anyone for it, but would like to have them drained or treated.  She is currently homeless.  She is working and is able to shower at work.    Wed went to ED for rectal bleeding.  Has appt with GI in July.       Medications and allergies reviewed with patient and updated if appropriate.  Patient Active Problem List   Diagnosis Date Noted  . COVID-19 virus infection 01/07/2020  . Panic anxiety syndrome 01/03/2020  . Allergic asthma 01/13/2018  . Rash 01/13/2018  . Allergic rhinitis 03/12/2017  . Hyperglycemia 01/01/2017  . Allergy   . Former smoker   . Anxiety 10/26/2016  . Bronchopneumonia 10/13/2016  . Acute asthma exacerbation 10/13/2016  . Hypokalemia 10/13/2016  . Encounter for well adult exam with abnormal findings 04/17/2016  . Class 2 obesity due to excess calories without serious comorbidity with body mass index (BMI) of 37.0 to 37.9 in adult 04/17/2016  . Asthma 03/13/2016  . Borderline personality disorder ([place]) 03/13/2016  . Eczema 03/13/2016  . Cough 01/28/2016  . Antibiotic-associated diarrhea 01/28/2016  . Mood disorder ([place]) 03/04/2013    Current Outpatient Medications on File Prior to Visit  Medication Sig Dispense Refill  . albuterol (PROVENTIL) (2.5  MG/3ML) 0.083% nebulizer solution Take 3 mLs (2.5 mg total) by nebulization every 6 (six) hours as needed for wheezing or shortness of breath. 150 mL 1  . albuterol (VENTOLIN HFA) 108 (90 Base) MCG/ACT inhaler USE 2 PUFFS EVERY 6 HOURS AS NEEDED FOR WHEEZING OR SHORTNESS OF BREATH 18 g 1  . albuterol (VENTOLIN HFA) 108 (90 Base) MCG/ACT inhaler Inhale 2 puffs into the lungs every 6 (six) hours as needed for wheezing or shortness of breath. 18 g 1  . ALPRAZolam (XANAX) 1 MG tablet Take 1 tablet (1 mg total) by mouth 2 (two) times daily as needed for anxiety. 40 tablet 0  . budesonide-formoterol (SYMBICORT) 160-4.5 MCG/ACT inhaler Inhale 2 puffs into the lungs 2 (two) times daily. 10.2 g 5  . diphenoxylate-atropine (LOMOTIL) 2.5-0.025 MG tablet Take 1 tablet by mouth 4 (four) times daily as needed for diarrhea or loose stools. 30 tablet 0  . FLUoxetine (PROZAC) 40 MG capsule Take 1 capsule (40 mg total) by mouth daily. 30 capsule 0  . fluticasone (FLONASE) 50 MCG/ACT nasal spray Place 2 sprays into both nostrils daily. 16 g 5  . gabapentin (NEURONTIN) 300 MG capsule Take 1 capsule (300 mg total) by mouth 3 (three) times daily. 90 capsule 2  . gabapentin (NEURONTIN) 600 MG tablet Take 600 mg by mouth 3 (three)  times daily.    . hydrOXYzine (ATARAX/VISTARIL) 50 MG tablet Take 1 tablet (50 mg total) by mouth in the morning and at bedtime. 1 tablet in the morning. 2 tablets at night 60 tablet 5  . montelukast (SINGULAIR) 10 MG tablet Take 1 tablet (10 mg total) by mouth at bedtime. 30 tablet 5  . nystatin (MYCOSTATIN/NYSTOP) powder APPLY TO THE AFFECTED AREA TWICE DAILY 60 g 5  . nystatin-triamcinolone ointment (MYCOLOG) APPLY TO AFFECTED AREA TWICE DAILY. 60 g 0  . PORTIA-28 0.15-30 MG-MCG tablet Take 1 tablet by mouth daily.    . prazosin (MINIPRESS) 1 MG capsule Take 2 mg by mouth 2 (two) times daily as needed.    . traZODone (DESYREL) 100 MG tablet Take 100 mg by mouth at bedtime.    . vitamin B-12  (CYANOCOBALAMIN) 1000 MCG tablet Take 1 tablet (1,000 mcg total) by mouth daily. 90 tablet 1  . Vitamin D, Ergocalciferol, (DRISDOL) 1.25 MG (50000 UNIT) CAPS capsule Take 1 capsule (50,000 Units total) by mouth every 7 (seven) days. 12 capsule 0  . omeprazole (PRILOSEC) 40 MG capsule Take 1 capsule (40 mg total) by mouth in the morning and at bedtime. 60 capsule 5  . potassium chloride SA (KLOR-CON) 20 MEQ tablet Take by mouth. (Patient not taking: Reported on 06/28/2020)     No current facility-administered medications on file prior to visit.    Past Medical History:  Diagnosis Date  . Allergy   . Anxiety   . Asthma   . Borderline personality disorder ([place])   . Depression   . Eczema   . Former smoker   . Obesity   . PTSD (post-traumatic stress disorder)   . Urticaria     Past Surgical History:  Procedure Laterality Date  . Thumb surgery Left     Social History   Socioeconomic History  . Marital status: Single    Spouse name: Not on file  . Number of children: 0  . Years of education: 9  . Highest education level: Not on file  Occupational History  . Not on file  Tobacco Use  . Smoking status: Former Smoker    Packs/day: 0.50    Years: 9.00    Pack years: 4.50    Quit date: 12/04/2015    Years since quitting: 4.5  . Smokeless tobacco: Never Used  Vaping Use  . Vaping Use: Former  Substance and Sexual Activity  . Alcohol use: Yes    Alcohol/week: 8.0 standard drinks    Types: 8 Cans of beer per week    Comment: occ  . Drug use: Yes    Frequency: 4.0 times per week    Types: Marijuana    Comment: Last smoked this morning-03/09/19  . Sexual activity: Yes    Birth control/protection: Pill  Other Topics Concern  . Not on file  Social History Narrative   Fun: Play video games and watch movies.   Denies abuse and feels safe at home.   Social Determinants of Health   Financial Resource Strain: Not on file  Food Insecurity: Not on file  Transportation Needs:  Not on file  Physical Activity: Not on file  Stress: Not on file  Social Connections: Not on file    Family History  Problem Relation Age of Onset  . Healthy Mother   . Healthy Father   . Alzheimer's disease Maternal Grandmother   . Skin cancer Paternal Grandmother   . Allergic rhinitis Paternal Grandmother   .  Asthma Paternal Grandmother   . Angioedema Neg Hx   . Eczema Neg Hx   . Urticaria Neg Hx     Review of Systems  Constitutional: Positive for fatigue. Negative for fever.  Skin: Positive for color change and wound.  Neurological: Positive for light-headedness. Negative for headaches.       Objective:   Vitals:   06/28/20 1420  BP: 102/78  Pulse: (!) 107  Temp: 99 F (37.2 C)  SpO2: 97%   BP Readings from Last 3 Encounters:  06/28/20 102/78  06/26/20 (!) 167/100  01/04/20 (!) 137/98   Wt Readings from Last 3 Encounters:  06/28/20 223 lb (101.2 kg)  06/26/20 222 lb 9.6 oz (101 kg)  06/02/19 233 lb (105.7 kg)   Body mass index is 40.79 kg/m.   Physical Exam Constitutional:      General: She is not in acute distress.    Appearance: Normal appearance. She is not ill-appearing.  HENT:     Head: Normocephalic and atraumatic.  Skin:    General: Skin is warm and dry.     Comments: Bilateral axillary hidradenitis suppurativa-lesions are small, but tender.  Left lower leg small pus filled papule with surrounding erythema approximately the size of a quarter.  Right posterior thigh small cut that appears to have some pus inside with surrounding erythema.  Left buttock with significant erythema approximately the size of a cantaloupe, central pustule-area is indurated, warm and tender.  No area of fluctuance  Neurological:     Mental Status: She is alert.            Assessment & Plan:    Cellulitis, left buttock, infected laceration left lower leg and right posterior thigh: Area of significant cellulitis left buttock with pus filled lesion here and in  the other 2 areas Start doxycycline 100 mg twice daily x10 days Continue daily showers Encouraged warm compresses if possible, but that may be difficult for her to do Advised that if these are not improving she needs to come back  Hidradenitis suppurativa: Bilateral axillary regions Areas are small but advised there may not be anything that can be drained Hopefully the doxycycline will help She would like to see a dermatologist-referred today  She did notice that she is due for hepatitis C screening and would like to have that done-ordered   This visit occurred during the SARS-CoV-2 public health emergency.  Safety protocols were in place, including screening questions prior to the visit, additional usage of staff PPE, and extensive cleaning of exam room while observing appropriate contact time as indicated for disinfecting solutions.

## 2020-07-02 LAB — HEPATITIS C ANTIBODY
Hepatitis C Ab: NONREACTIVE
SIGNAL TO CUT-OFF: 0.01 (ref <1.00)

## 2020-07-09 ENCOUNTER — Other Ambulatory Visit: Payer: Self-pay

## 2020-07-10 ENCOUNTER — Ambulatory Visit (INDEPENDENT_AMBULATORY_CARE_PROVIDER_SITE_OTHER): Payer: 59 | Admitting: Internal Medicine

## 2020-07-10 ENCOUNTER — Other Ambulatory Visit: Payer: Self-pay

## 2020-07-10 ENCOUNTER — Encounter: Payer: Self-pay | Admitting: Internal Medicine

## 2020-07-10 VITALS — BP 104/76 | HR 86 | Temp 98.6°F | Ht 62.0 in | Wt 219.2 lb

## 2020-07-10 DIAGNOSIS — R739 Hyperglycemia, unspecified: Secondary | ICD-10-CM | POA: Diagnosis not present

## 2020-07-10 DIAGNOSIS — L0231 Cutaneous abscess of buttock: Secondary | ICD-10-CM

## 2020-07-10 DIAGNOSIS — R350 Frequency of micturition: Secondary | ICD-10-CM

## 2020-07-10 DIAGNOSIS — K6289 Other specified diseases of anus and rectum: Secondary | ICD-10-CM

## 2020-07-10 DIAGNOSIS — E559 Vitamin D deficiency, unspecified: Secondary | ICD-10-CM

## 2020-07-10 DIAGNOSIS — Z0001 Encounter for general adult medical examination with abnormal findings: Secondary | ICD-10-CM

## 2020-07-10 DIAGNOSIS — E538 Deficiency of other specified B group vitamins: Secondary | ICD-10-CM | POA: Diagnosis not present

## 2020-07-10 DIAGNOSIS — Z23 Encounter for immunization: Secondary | ICD-10-CM | POA: Diagnosis not present

## 2020-07-10 LAB — BASIC METABOLIC PANEL
BUN: 8 mg/dL (ref 6–23)
CO2: 25 mEq/L (ref 19–32)
Calcium: 8.9 mg/dL (ref 8.4–10.5)
Chloride: 104 mEq/L (ref 96–112)
Creatinine, Ser: 0.79 mg/dL (ref 0.40–1.20)
GFR: 98.81 mL/min (ref >60.00)
Glucose, Bld: 91 mg/dL (ref 70–99)
Potassium: 4 mEq/L (ref 3.5–5.1)
Sodium: 135 mEq/L (ref 135–145)

## 2020-07-10 LAB — CBC WITH DIFFERENTIAL/PLATELET
Basophils Absolute: 0.1 10*3/uL (ref 0.0–0.1)
Basophils Relative: 1 % (ref 0.0–3.0)
Eosinophils Absolute: 0.1 10*3/uL (ref 0.0–0.7)
Eosinophils Relative: 1.5 % (ref 0.0–5.0)
HCT: 40.9 % (ref 36.0–46.0)
Hemoglobin: 13.3 g/dL (ref 12.0–15.0)
Lymphocytes Relative: 20.9 % (ref 12.0–46.0)
Lymphs Abs: 2 10*3/uL (ref 0.7–4.0)
MCHC: 32.5 g/dL (ref 30.0–36.0)
MCV: 84.4 fl (ref 78.0–100.0)
Monocytes Absolute: 0.5 10*3/uL (ref 0.1–1.0)
Monocytes Relative: 5.7 % (ref 3.0–12.0)
Neutro Abs: 6.9 10*3/uL (ref 1.4–7.7)
Neutrophils Relative %: 70.9 % (ref 43.0–77.0)
Platelets: 357 10*3/uL (ref 150.0–400.0)
RBC: 4.85 Mil/uL (ref 3.87–5.11)
RDW: 13.2 % (ref 11.5–15.5)
WBC: 9.7 10*3/uL (ref 4.0–10.5)

## 2020-07-10 LAB — VITAMIN B12: Vitamin B-12: 247 pg/mL (ref 211–911)

## 2020-07-10 LAB — LIPID PANEL
Cholesterol: 173 mg/dL (ref 0–200)
HDL: 51.7 mg/dL (ref >39.00)
LDL Cholesterol: 100 mg/dL — ABNORMAL HIGH (ref 0–99)
NonHDL: 120.94
Total CHOL/HDL Ratio: 3
Triglycerides: 103 mg/dL (ref 0.0–149.0)
VLDL: 20.6 mg/dL (ref 0.0–40.0)

## 2020-07-10 LAB — HEPATIC FUNCTION PANEL
ALT: 17 U/L (ref 0–35)
AST: 20 U/L (ref 0–37)
Albumin: 3.7 g/dL (ref 3.5–5.2)
Alkaline Phosphatase: 76 U/L (ref 39–117)
Bilirubin, Direct: 0.1 mg/dL (ref 0.0–0.3)
Total Bilirubin: 0.6 mg/dL (ref 0.2–1.2)
Total Protein: 6.8 g/dL (ref 6.0–8.3)

## 2020-07-10 LAB — VITAMIN D 25 HYDROXY (VIT D DEFICIENCY, FRACTURES): VITD: 31.02 ng/mL (ref 30.00–100.00)

## 2020-07-10 LAB — HEMOGLOBIN A1C: Hgb A1c MFr Bld: 5.6 % (ref 4.6–6.5)

## 2020-07-10 LAB — TSH: TSH: 2.39 u[IU]/mL (ref 0.35–4.50)

## 2020-07-10 MED ORDER — FLUCONAZOLE 150 MG PO TABS: ORAL_TABLET | ORAL | 1 refills | Status: DC

## 2020-07-10 MED ORDER — HYDROCORTISONE ACETATE 25 MG RE SUPP: 25.0000 mg | Freq: Two times a day (BID) | RECTAL | 1 refills | Status: DC

## 2020-07-10 NOTE — Patient Instructions (Signed)
You had the Pneumovax pneumonia shot today  You can go to any [facility] or other pharmacy for the [facility] covid booster  Please take all new medication as prescribed - the anusol HC suppos's  You are given the work note today  Please continue all other medications as before, and refills have been done if requested.  Please have the pharmacy call with any other refills you may need.  Please continue your efforts at being more active, low cholesterol diet, and weight control.  You are otherwise up to date with prevention measures today.  Please keep your appointments with your specialists as you may have planned - GI in July 2022  Please go to the LAB at the blood drawing area for the tests to be done  You will be contacted by phone if any changes need to be made immediately.  Otherwise, you will receive a letter about your results with an explanation, but please check with MyChart first.  Please remember to sign up for MyChart if you have not done so, as this will be important to you in the future with finding out test results, communicating by private email, and scheduling acute appointments online when needed.  Please make an Appointment to return for your 1 year visit, or sooner if needed

## 2020-07-10 NOTE — Progress Notes (Signed)
[name], female   DOB: Oct 06, 1985, 35 y.o.   MRN: [id number]         Chief Complaint:: wellness exam and Heat Exposure  Needin work note, anal pain, obesity, recent right buttock abscess, obesity       HPI:  [name] is a 35 y.o. female here for wellness exam; due for pneumovax, plans to have covid at pharmacy, o/w up to date with preventive referrals and immunizations.  Plans to call for GYN appt soon for pap                        Also co 2 mo ongoing chronic persistent anal pain, usuallty mild but occasionally more severe with ? Tearing with bowel movements, but denies significant hematochezia and Denies worsening reflux, abd pain, dysphagia, n/v, bowel change or blood.  Did have a recent left buttock abscess tx with antibx now essentially resolved with some induration but no fever or drainage.  Has ongoing stressors, including recent homeless but now has job at [employer] where she is required to spend up to 8 hrs in the heat which she cannot tolerate.  She is asking for limit 5 hrs.  Has lost some wt recenlty with stress as well, few lbs only.  Pt denies chest pain, increased sob or doe, wheezing, orthopnea, PND, increased LE swelling, palpitations, dizziness or syncope.   Pt denies polydipsia, polyuria, or new focal neuro s/s.   Pt denies fever, wt loss, night sweats, loss of appetite, or other constitutional symptoms  also with some 2 days urinary frequency, asks for urine culture.  Wt Readings from Last 3 Encounters:  07/13/20 219 lb 2.2 oz (99.4 kg)  07/10/20 219 lb 3.2 oz (99.4 kg)  06/28/20 223 lb (101.2 kg)   BP Readings from Last 3 Encounters:  07/13/20 116/76  07/10/20 104/76  06/28/20 102/78   Immunization History  Administered Date(s) Administered   Influenza,inj,Quad PF,6+ Mos 10/15/2016, 10/26/2017   Influenza-Unspecified 10/04/2015   PFIZER(Purple Top)SARS-COV-2 Vaccination 04/27/2019, 05/22/2019   Pneumococcal Polysaccharide-23 10/15/2016,  07/10/2020   Tdap 04/17/2016   Health Maintenance Due  Topic Date Due   Pneumococcal Vaccine 29-71 Years old (1 - PCV) Never done   PAP SMEAR-Modifier  02/21/2019      Past Medical History:  Diagnosis Date   Allergy    Anxiety    Asthma    Borderline personality disorder ([place])    Depression    Eczema    Former smoker    Obesity    PTSD (post-traumatic stress disorder)    Urticaria    Past Surgical History:  Procedure Laterality Date   Thumb surgery Left     reports that she quit smoking about 4 years ago. She has a 4.50 pack-year smoking history. She has never used smokeless tobacco. She reports current alcohol use of about 8.0 standard drinks of alcohol per week. She reports current drug use. Frequency: 4.00 times per week. Drug: Marijuana. family history includes Allergic rhinitis in her paternal grandmother; Alzheimer's disease in her maternal grandmother; Asthma in her paternal grandmother; Healthy in her father and mother; Skin cancer in her paternal grandmother. Allergies  Allergen Reactions   Benztropine Other (See Comments)    Report hypersensitivity Report hypersensitivity    Other     Opiates- history of dependency    Current Outpatient Medications on File Prior to Visit  Medication Sig Dispense Refill   albuterol (PROVENTIL) (2.5 MG/3ML)  0.083% nebulizer solution Take 3 mLs (2.5 mg total) by nebulization every 6 (six) hours as needed for wheezing or shortness of breath. (Patient not taking: Reported on 07/13/2020) 150 mL 1   albuterol (VENTOLIN HFA) 108 (90 Base) MCG/ACT inhaler USE 2 PUFFS EVERY 6 HOURS AS NEEDED FOR WHEEZING OR SHORTNESS OF BREATH 18 g 1   albuterol (VENTOLIN HFA) 108 (90 Base) MCG/ACT inhaler Inhale 2 puffs into the lungs every 6 (six) hours as needed for wheezing or shortness of breath. (Patient not taking: Reported on 07/13/2020) 18 g 1   ALPRAZolam (XANAX) 1 MG tablet Take 1 tablet (1 mg total) by mouth 2 (two) times daily as needed for  anxiety. 40 tablet 0   budesonide-formoterol (SYMBICORT) 160-4.5 MCG/ACT inhaler Inhale 2 puffs into the lungs 2 (two) times daily. 10.2 g 5   FLUoxetine (PROZAC) 40 MG capsule Take 1 capsule (40 mg total) by mouth daily. (Patient taking differently: Take 40 mg by mouth in the morning.) 30 capsule 0   fluticasone (FLONASE) 50 MCG/ACT nasal spray Place 2 sprays into both nostrils daily. (Patient taking differently: Place 2 sprays into both nostrils at bedtime.) 16 g 5   gabapentin (NEURONTIN) 300 MG capsule Take 1 capsule (300 mg total) by mouth 3 (three) times daily. (Patient taking differently: Take 600 mg by mouth 3 (three) times daily.) 90 capsule 2   hydrOXYzine (ATARAX/VISTARIL) 50 MG tablet Take 1 tablet (50 mg total) by mouth in the morning and at bedtime. 1 tablet in the morning. 2 tablets at night 60 tablet 5   montelukast (SINGULAIR) 10 MG tablet Take 1 tablet (10 mg total) by mouth at bedtime. 30 tablet 5   nystatin (MYCOSTATIN/NYSTOP) powder APPLY TO THE AFFECTED AREA TWICE DAILY (Patient taking differently: Apply 1 application topically 2 (two) times daily. APPLY TO THE AFFECTED AREA TWICE DAILY) 60 g 5   nystatin-triamcinolone ointment (MYCOLOG) APPLY TO AFFECTED AREA TWICE DAILY. (Patient taking differently: Apply 1 application topically 2 (two) times daily. APPLY TO AFFECTED AREA TWICE DAILY.) 60 g 0   potassium chloride SA (KLOR-CON) 20 MEQ tablet Take 20 mEq by mouth daily.     prazosin (MINIPRESS) 1 MG capsule Take 1-3 mg by mouth See admin instructions. Take 1 tablet in the morning and 3 tablets before bedtime.     traZODone (DESYREL) 100 MG tablet Take 100 mg by mouth at bedtime as needed for sleep.     vitamin B-12 (CYANOCOBALAMIN) 1000 MCG tablet Take 1 tablet (1,000 mcg total) by mouth daily. (Patient not taking: Reported on 07/13/2020) 90 tablet 1   diphenoxylate-atropine (LOMOTIL) 2.5-0.025 MG tablet Take 1 tablet by mouth 4 (four) times daily as needed for diarrhea or loose  stools. (Patient not taking: No sig reported) 30 tablet 0   gabapentin (NEURONTIN) 600 MG tablet Take 600 mg by mouth 3 (three) times daily.     levonorgestrel-ethinyl estradiol (NORDETTE) 0.15-30 MG-MCG tablet 0.15 mg.     omeprazole (PRILOSEC) 40 MG capsule Take 1 capsule (40 mg total) by mouth in the morning and at bedtime. (Patient taking differently: Take 40 mg by mouth in the morning and at bedtime.) 60 capsule 5   No current facility-administered medications on file prior to visit.        ROS:  All others reviewed and negative.  Objective        PE:  BP 104/76 (BP Location: Left Arm, Patient Position: Sitting, Cuff Size: Large)   Pulse 86   Temp  98.6 F (37 C) (Oral)   Ht 5\' 2"  (1.575 m)   Wt 219 lb 3.2 oz (99.4 kg)   LMP 07/05/2020   SpO2 96%   BMI 40.09 kg/m                 Constitutional: Pt appears in NAD               HENT: Head: NCAT.                Right Ear: External ear normal.                 Left Ear: External ear normal.                Eyes: . Pupils are equal, round, and reactive to light. Conjunctivae and EOM are normal               Nose: without d/c or deformity               Neck: Neck supple. Gross normal ROM               Cardiovascular: Normal rate and regular rhythm.                 Pulmonary/Chest: Effort normal and breath sounds without rales or wheezing.                Abd:  Soft, NT, ND, + BS, no organomegaly; left buttock with mid aspect 2 cm area midl tender induration without some post inflamamtory hyperpigmentation, no erythema, sweling or flucutuance or drainage.               Neurological: Pt is alert. At baseline orientation, motor grossly intact               Skin: Skin is warm. No rashes, no other new lesions, LE edema - none               Psychiatric: Pt behavior is normal without agitation   Micro: none  Cardiac tracings I have personally interpreted today:  none  Pertinent Radiological findings (summarize): none   Lab Results   Component Value Date   WBC 9.2 07/13/2020   HGB 13.0 07/13/2020   HCT 40.6 07/13/2020   PLT 383 07/13/2020   GLUCOSE 99 07/13/2020   CHOL 173 07/10/2020   TRIG 103.0 07/10/2020   HDL 51.70 07/10/2020   LDLCALC 100 (H) 07/10/2020   ALT 16 07/13/2020   AST 17 07/13/2020   NA 137 07/13/2020   K 4.1 07/13/2020   CL 106 07/13/2020   CREATININE 0.90 07/13/2020   BUN <5 (L) 07/13/2020   CO2 22 07/13/2020   TSH 2.39 07/10/2020   HGBA1C 5.6 07/10/2020   Assessment/Plan:  [name] is a 35 y.o. White or Caucasian [1] female with  has a past medical history of Allergy, Anxiety, Asthma, Borderline personality disorder ([place]), Depression, Eczema, Former smoker, Obesity, PTSD (post-traumatic stress disorder), and Urticaria.  Encounter for well adult exam with abnormal findings Age and sex appropriate education and counseling updated with regular exercise and diet Referrals for preventative services - none needed Immunizations addressed - for pneumovax, pt plans for covid booster soon Smoking counseling  - none needed Evidence for depression or other mood disorder - none significant Most recent labs reviewed. I have personally reviewed and have noted: 1) the patient's medical and social history 2) The patient's current medications and supplements 3) The patient's  height, weight, and BMI have been recorded in the chart   Hyperglycemia Lab Results  Component Value Date   HGBA1C 5.6 07/10/2020   Stable, pt to continue current medical treatment  - diet   Anal pain ? Fissure vs other, for anusol hc asd, f/u GI - has appt July 2022  Urinary frequency Exam benign, also for urine cx  Left buttock abscess Recent, now near resolved, pt reassured  Vitamin D deficiency Last vitamin D Lab Results  Component Value Date   VD25OH 31.02 07/10/2020   Low normal, to start oral replacement   B12 deficiency Lab Results  Component Value Date   VITAMINB12 247 07/10/2020   Low  normal to start oral replacement - b12 1000 mcg qd  Followup: Return in about 1 year (around 07/10/2021).  Cathlean Cower, MD 07/15/2020 9:43 PM [facility]

## 2020-07-11 ENCOUNTER — Encounter: Payer: Self-pay | Admitting: Internal Medicine

## 2020-07-11 LAB — URINALYSIS, ROUTINE W REFLEX MICROSCOPIC
Hgb urine dipstick: NEGATIVE
Ketones, ur: NEGATIVE
Nitrite: NEGATIVE
Specific Gravity, Urine: 1.025 (ref 1.000–1.030)
Total Protein, Urine: NEGATIVE
Urine Glucose: NEGATIVE
Urobilinogen, UA: 0.2 (ref 0.0–1.0)
pH: 6 (ref 5.0–8.0)

## 2020-07-11 NOTE — Telephone Encounter (Signed)
Patient is requesting a call back in regards to recent lab results. Informed patient that Dr. Jenny Reichmann has not made notes on the results and when he does someone will call her. Please advise

## 2020-07-13 ENCOUNTER — Other Ambulatory Visit: Payer: Self-pay

## 2020-07-13 ENCOUNTER — Encounter: Payer: Self-pay | Admitting: Internal Medicine

## 2020-07-13 ENCOUNTER — Emergency Department (HOSPITAL_COMMUNITY)
Admission: EM | Admit: 2020-07-13 | Discharge: 2020-07-13 | Disposition: A | Discharge status: Home / Self Care | Payer: 59 | Attending: Emergency Medicine | Admitting: Emergency Medicine

## 2020-07-13 ENCOUNTER — Encounter (HOSPITAL_COMMUNITY): Payer: Self-pay | Admitting: *Deleted

## 2020-07-13 DIAGNOSIS — L292 Pruritus vulvae: Secondary | ICD-10-CM | POA: Insufficient documentation

## 2020-07-13 DIAGNOSIS — Z87891 Personal history of nicotine dependence: Secondary | ICD-10-CM | POA: Insufficient documentation

## 2020-07-13 DIAGNOSIS — Z8616 Personal history of COVID-19: Secondary | ICD-10-CM | POA: Insufficient documentation

## 2020-07-13 DIAGNOSIS — N898 Other specified noninflammatory disorders of vagina: Secondary | ICD-10-CM

## 2020-07-13 DIAGNOSIS — J45909 Unspecified asthma, uncomplicated: Secondary | ICD-10-CM | POA: Insufficient documentation

## 2020-07-13 LAB — I-STAT BETA HCG BLOOD, ED (MC, WL, AP ONLY): I-stat hCG, quantitative: <5 m[IU]/mL (ref <5)

## 2020-07-13 LAB — URINALYSIS, ROUTINE W REFLEX MICROSCOPIC
Bilirubin Urine: NEGATIVE
Glucose, UA: NEGATIVE mg/dL
Hgb urine dipstick: NEGATIVE
Ketones, ur: NEGATIVE mg/dL
Leukocytes,Ua: NEGATIVE
Nitrite: NEGATIVE
Protein, ur: NEGATIVE mg/dL
Specific Gravity, Urine: 1.01 (ref 1.005–1.030)
pH: 6 (ref 5.0–8.0)

## 2020-07-13 LAB — COMPREHENSIVE METABOLIC PANEL
ALT: 16 U/L (ref 0–44)
AST: 17 U/L (ref 15–41)
Albumin: 3.1 g/dL — ABNORMAL LOW (ref 3.5–5.0)
Alkaline Phosphatase: 68 U/L (ref 38–126)
Anion gap: 9 (ref 5–15)
BUN: <5 mg/dL — ABNORMAL LOW (ref 6–20)
CO2: 22 mmol/L (ref 22–32)
Calcium: 8.9 mg/dL (ref 8.9–10.3)
Chloride: 106 mmol/L (ref 98–111)
Creatinine, Ser: 0.9 mg/dL (ref 0.44–1.00)
GFR, Estimated: >60 mL/min (ref >60)
Glucose, Bld: 99 mg/dL (ref 70–99)
Potassium: 4.1 mmol/L (ref 3.5–5.1)
Sodium: 137 mmol/L (ref 135–145)
Total Bilirubin: 0.4 mg/dL (ref 0.3–1.2)
Total Protein: 6.4 g/dL — ABNORMAL LOW (ref 6.5–8.1)

## 2020-07-13 LAB — WET PREP, GENITAL
Clue Cells Wet Prep HPF POC: NONE SEEN
Sperm: NONE SEEN
Trich, Wet Prep: NONE SEEN
Yeast Wet Prep HPF POC: NONE SEEN

## 2020-07-13 LAB — CBC
HCT: 40.6 % (ref 36.0–46.0)
Hemoglobin: 13 g/dL (ref 12.0–15.0)
MCH: 28.1 pg (ref 26.0–34.0)
MCHC: 32 g/dL (ref 30.0–36.0)
MCV: 87.9 fL (ref 80.0–100.0)
Platelets: 383 10*3/uL (ref 150–400)
RBC: 4.62 MIL/uL (ref 3.87–5.11)
RDW: 12.9 % (ref 11.5–15.5)
WBC: 9.2 10*3/uL (ref 4.0–10.5)
nRBC: 0 % (ref 0.0–0.2)

## 2020-07-13 MED ORDER — CEFTRIAXONE SODIUM 500 MG IJ SOLR
500.0000 mg | Freq: Once | INTRAMUSCULAR | Status: AC
  Administered 2020-07-13: 500 mg via INTRAMUSCULAR
  Filled 2020-07-13: qty 500

## 2020-07-13 MED ORDER — DOXYCYCLINE HYCLATE 100 MG PO TABS
100.0000 mg | ORAL_TABLET | Freq: Once | ORAL | Status: AC
  Administered 2020-07-13: 100 mg via ORAL
  Filled 2020-07-13: qty 1

## 2020-07-13 MED ORDER — LIDOCAINE HCL (PF) 1 % IJ SOLN
1.0000 mL | Freq: Once | INTRAMUSCULAR | Status: AC
  Administered 2020-07-13: 1 mL
  Filled 2020-07-13: qty 5

## 2020-07-13 MED ORDER — DOXYCYCLINE HYCLATE 100 MG PO CAPS: 100.0000 mg | ORAL_CAPSULE | Freq: Two times a day (BID) | ORAL | 0 refills | Status: AC

## 2020-07-13 NOTE — ED Provider Notes (Signed)
[facility] EMERGENCY DEPARTMENT Provider Note   CSN: [id number] Arrival date & time: 07/13/20  0031     History Chief Complaint  Patient presents with   Vaginal Itching    [name] is a 35 y.o. female.  HPI 35 year old female with history of borderline personality disorder, depression, eczema, PTSD, anxiety, asthma presents to the ER with 3 days of vaginal irritation, and dysuria.  Patient was seen at her PCPs office with similar complaints, was started on fluconazole, but the patient states that her symptoms got worse after she started taking it.  She has a urine culture pending, wanted to come to the ER for further evaluation.  She denies any pelvic pain.  She is sexually active with one partner.  She reports that she does get frequent yeast infections which are usually relieved with oral fluconazole    Past Medical History:  Diagnosis Date   Allergy    Anxiety    Asthma    Borderline personality disorder ([place])    Depression    Eczema    Former smoker    Obesity    PTSD (post-traumatic stress disorder)    Urticaria     Patient Active Problem List   Diagnosis Date Noted   Anal pain 07/15/2020   Urinary frequency 07/15/2020   Left buttock abscess 07/15/2020   Vitamin D deficiency 07/15/2020   B12 deficiency 07/15/2020   MDD (major depressive disorder), recurrent severe, without psychosis ([place]) 04/26/2020   COVID-19 virus infection 01/07/2020   PTSD (post-traumatic stress disorder) 01/03/2020   Allergic asthma 01/13/2018   Rash 01/13/2018   Allergic rhinitis 03/12/2017   Hyperglycemia 01/01/2017   Allergy    Former smoker    Anxiety 10/26/2016   Bronchopneumonia 10/13/2016   Acute asthma exacerbation 10/13/2016   Hypokalemia 10/13/2016   Encounter for well adult exam with abnormal findings 04/17/2016   Class 2 obesity due to excess calories without serious comorbidity with body mass index (BMI) of 37.0 to 37.9 in adult 04/17/2016    Asthma 03/13/2016   Borderline personality disorder ([place]) 03/13/2016   Eczema 03/13/2016   Cough 01/28/2016   Antibiotic-associated diarrhea 01/28/2016   Mood disorder ([place]) 03/04/2013    Past Surgical History:  Procedure Laterality Date   Thumb surgery Left      OB History   No obstetric history on file.     Family History  Problem Relation Age of Onset   Healthy Mother    Healthy Father    Alzheimer's disease Maternal Grandmother    Skin cancer Paternal Grandmother    Allergic rhinitis Paternal Grandmother    Asthma Paternal Grandmother    Angioedema Neg Hx    Eczema Neg Hx    Urticaria Neg Hx     Social History   Tobacco Use   Smoking status: Former    Packs/day: 0.50    Years: 9.00    Pack years: 4.50    Types: Cigarettes    Quit date: 12/04/2015    Years since quitting: 4.6   Smokeless tobacco: Never  Vaping Use   Vaping Use: Former  Substance Use Topics   Alcohol use: Yes    Alcohol/week: 8.0 standard drinks    Types: 8 Cans of beer per week    Comment: occ   Drug use: Yes    Frequency: 4.0 times per week    Types: Marijuana    Comment: Last smoked this morning-03/09/19    Home Medications Prior to  Admission medications   Medication Sig Start Date End Date Taking? Authorizing Provider  albuterol (VENTOLIN HFA) 108 (90 Base) MCG/ACT inhaler USE 2 PUFFS EVERY 6 HOURS AS NEEDED FOR WHEEZING OR SHORTNESS OF BREATH 05/10/20  Yes Valentina Shaggy, MD  ALPRAZolam Duanne Moron) 1 MG tablet Take 1 tablet (1 mg total) by mouth 2 (two) times daily as needed for anxiety. 01/03/20  Yes Biagio Borg, MD  budesonide-formoterol [facility]) 160-4.5 MCG/ACT inhaler Inhale 2 puffs into the lungs 2 (two) times daily. 05/10/20  Yes Valentina Shaggy, MD  doxycycline (VIBRAMYCIN) 100 MG capsule Take 1 capsule (100 mg total) by mouth 2 (two) times daily for 7 days. 07/13/20 07/20/20 Yes Garald Balding, PA-C  fluconazole (DIFLUCAN) 150 MG tablet 1 tab by mouth every 3 days as  needed 07/10/20  Yes Biagio Borg, MD  FLUoxetine (PROZAC) 40 MG capsule Take 1 capsule (40 mg total) by mouth daily. Patient taking differently: Take 40 mg by mouth in the morning. 10/15/16  Yes Regalado, Belkys A, MD  fluticasone (FLONASE) 50 MCG/ACT nasal spray Place 2 sprays into both nostrils daily. Patient taking differently: Place 2 sprays into both nostrils at bedtime. 05/10/20  Yes Valentina Shaggy, MD  gabapentin (NEURONTIN) 300 MG capsule Take 1 capsule (300 mg total) by mouth 3 (three) times daily. Patient taking differently: Take 600 mg by mouth 3 (three) times daily. 05/01/14  Yes Wendie Agreste, MD  gabapentin (NEURONTIN) 600 MG tablet Take 600 mg by mouth 3 (three) times daily. 05/15/20  Yes [provider]  hydrocortisone (ANUSOL-HC) 25 MG suppository Place 1 suppository (25 mg total) rectally 2 (two) times daily. 07/10/20  Yes Biagio Borg, MD  hydrOXYzine (ATARAX/VISTARIL) 50 MG tablet Take 1 tablet (50 mg total) by mouth in the morning and at bedtime. 1 tablet in the morning. 2 tablets at night 09/07/19  Yes Valentina Shaggy, MD  levonorgestrel-ethinyl estradiol (NORDETTE) 0.15-30 MG-MCG tablet 0.15 mg.   Yes [provider]  montelukast (SINGULAIR) 10 MG tablet Take 1 tablet (10 mg total) by mouth at bedtime. 05/10/20  Yes Valentina Shaggy, MD  nystatin (MYCOSTATIN/NYSTOP) powder APPLY TO THE AFFECTED AREA TWICE DAILY Patient taking differently: Apply 1 application topically 2 (two) times daily. APPLY TO THE AFFECTED AREA TWICE DAILY 06/02/19  Yes Biagio Borg, MD  nystatin-triamcinolone ointment (MYCOLOG) APPLY TO AFFECTED AREA TWICE DAILY. Patient taking differently: Apply 1 application topically 2 (two) times daily. APPLY TO AFFECTED AREA TWICE DAILY. 06/28/20  Yes Biagio Borg, MD  omeprazole (PRILOSEC) 40 MG capsule Take 1 capsule (40 mg total) by mouth in the morning and at bedtime. Patient taking differently: Take 40 mg by mouth in the morning and  at bedtime. 05/10/20 07/13/20 Yes Valentina Shaggy, MD  potassium chloride SA (KLOR-CON) 20 MEQ tablet Take 20 mEq by mouth daily. 06/09/20  Yes [provider]  prazosin (MINIPRESS) 1 MG capsule Take 1-3 mg by mouth See admin instructions. Take 1 tablet in the morning and 3 tablets before bedtime. 05/02/20  Yes [provider]  traZODone (DESYREL) 100 MG tablet Take 100 mg by mouth at bedtime as needed for sleep. 05/02/20  Yes [provider]  albuterol (PROVENTIL) (2.5 MG/3ML) 0.083% nebulizer solution Take 3 mLs (2.5 mg total) by nebulization every 6 (six) hours as needed for wheezing or shortness of breath. Patient not taking: Reported on 07/13/2020 05/10/20   Valentina Shaggy, MD  albuterol (VENTOLIN HFA) 108 [phone]  Base) MCG/ACT inhaler Inhale 2 puffs into the lungs every 6 (six) hours as needed for wheezing or shortness of breath. Patient not taking: Reported on 07/13/2020 05/15/20   Valentina Shaggy, MD  diphenoxylate-atropine (LOMOTIL) 2.5-0.025 MG tablet Take 1 tablet by mouth 4 (four) times daily as needed for diarrhea or loose stools. Patient not taking: No sig reported 01/03/20   Biagio Borg, MD  vitamin B-12 (CYANOCOBALAMIN) 1000 MCG tablet Take 1 tablet (1,000 mcg total) by mouth daily. Patient not taking: Reported on 07/13/2020 06/23/19   Biagio Borg, MD    Allergies    Benztropine and Other  Review of Systems   Review of Systems  Constitutional:  Negative for fever.  Gastrointestinal:  Negative for nausea and vomiting.  Genitourinary:  Positive for dyspareunia, dysuria, vaginal discharge and vaginal pain. Negative for difficulty urinating, frequency, genital sores and pelvic pain.   Physical Exam Updated Vital Signs BP 116/76 (BP Location: Right Arm)   Pulse 75   Temp 98.1 F (36.7 C) (Oral)   Resp 17   Ht 5\' 2"  (1.575 m)   Wt 99.4 kg   LMP 07/05/2020   SpO2 98%   BMI 40.08 kg/m   Physical Exam Genitourinary:    Vagina: Normal.      Cervix: Normal.     Uterus: Absent.      Adnexa: Right adnexa normal and left adnexa normal.     Comments: Pelvic exam performed with Nyah NT at bedside.  No significant discharge noted, no odors, no cervical motion tenderness, no adnexal tenderness bilaterally.   ED Results / Procedures / Treatments   Labs (all labs ordered are listed, but only abnormal results are displayed) Labs Reviewed  WET PREP, GENITAL - Abnormal; Notable for the following components:      Result Value   WBC, Wet Prep HPF POC MANY (*)    All other components within normal limits  COMPREHENSIVE METABOLIC PANEL - Abnormal; Notable for the following components:   BUN <5 (*)    Total Protein 6.4 (*)    Albumin 3.1 (*)    All other components within normal limits  URINALYSIS, ROUTINE W REFLEX MICROSCOPIC - Abnormal; Notable for the following components:   APPearance HAZY (*)    All other components within normal limits  CBC  I-STAT BETA HCG BLOOD, ED ([facility], [facility], [facility] ONLY)  GC/CHLAMYDIA PROBE AMP ([place]) NOT AT [facility]    EKG None  Radiology No results found.  Procedures Procedures   Medications Ordered in ED Medications  cefTRIAXone (ROCEPHIN) injection 500 mg (500 mg Intramuscular Given 07/13/20 1231)  doxycycline (VIBRA-TABS) tablet 100 mg (100 mg Oral Given 07/13/20 1230)  lidocaine (PF) (XYLOCAINE) 1 % injection 1 mL (1 mL Other Given 07/13/20 1231)    ED Course  I have reviewed the triage vital signs and the nursing notes.  Pertinent labs & imaging results that were available during my care of the patient were reviewed by me and considered in my medical decision making (see chart for details).    MDM Rules/Calculators/A&P                          35 year old female presents to the ER with complaints of vaginal irritation, burning, dysuria.  Physical exam out any significant discharge, no yeast, no cervical motion tenderness or adnexal tenderness.  Labs ordered in triage overall unremarkable.  UA  without evidence of UTI.  Wet prep with  many WBCs but no trichomoniasis, yeast, clue cells.  GC chlamydia pending.  Given reassuring physical exam, suspicion for PID, TOA, ovarian torsion, ectopic pregnancy  No signs of yeast or BV.  Discussed prophylactic treatment for gonorrhea and chlamydia which the patient is agreeable to.  Patient was informed to follow-up on MyChart for the results and to have all partners tested and treated if these are positive.   I encouraged following up with OB/GYN, as well as taking Benadryl for itching or over the counter vagisil.  We discussed return precautions.  She was understanding and is agreeable.  Stable for discharge. Final Clinical Impression(s) / ED Diagnoses Final diagnoses:  Vaginal itching    Rx / DC Orders ED Discharge Orders          Ordered    doxycycline (VIBRAMYCIN) 100 MG capsule  2 times daily        07/13/20 1303             Lyndel Safe 07/16/20 1503    Pattricia Boss, MD 07/17/20 1029

## 2020-07-13 NOTE — Discharge Instructions (Addendum)
You were evaluated in the Emergency Department and after careful evaluation, we did not find any emergent condition requiring admission or further testing in the hospital.  Please follow-up with your OB/GYN about your symptoms.  You may take Benadryl for itching, you may also use vaginocele which can be found over-the-counter for your symptoms.  Please take the antibiotic as directed until finished.  Your gonorrhea and chlamydia results will be available via MyChart, please have all partners tested and treated if your results are positive. Please follow up on your pregnancy test results, discontinue the doxycycline if it is positive.   Please return to the Emergency Department if you experience any worsening of your condition.   Thank you for allowing [place] to be a part of your care.

## 2020-07-13 NOTE — ED Triage Notes (Signed)
The pt is c/o vaginal discharge and itching for 2-3 tests.  She had it checked in her doctors office yesterday  and she had samples sent off.  Results will be back ion 2 days she could not wait  lmp a few days aggo

## 2020-07-15 ENCOUNTER — Encounter: Payer: Self-pay | Admitting: Internal Medicine

## 2020-07-15 DIAGNOSIS — E538 Deficiency of other specified B group vitamins: Secondary | ICD-10-CM | POA: Insufficient documentation

## 2020-07-15 DIAGNOSIS — K6289 Other specified diseases of anus and rectum: Secondary | ICD-10-CM | POA: Insufficient documentation

## 2020-07-15 DIAGNOSIS — E559 Vitamin D deficiency, unspecified: Secondary | ICD-10-CM | POA: Insufficient documentation

## 2020-07-15 DIAGNOSIS — R350 Frequency of micturition: Secondary | ICD-10-CM | POA: Insufficient documentation

## 2020-07-15 DIAGNOSIS — L0231 Cutaneous abscess of buttock: Secondary | ICD-10-CM | POA: Insufficient documentation

## 2020-07-15 LAB — GC/CHLAMYDIA PROBE AMP (~~LOC~~) NOT AT ARMC
Chlamydia: NEGATIVE
Comment: NEGATIVE
Comment: NORMAL
Neisseria Gonorrhea: NEGATIVE

## 2020-07-15 NOTE — Assessment & Plan Note (Signed)
Recent, now near resolved, pt reassured

## 2020-07-15 NOTE — Assessment & Plan Note (Signed)
Lab Results  Component Value Date   HGBA1C 5.6 07/10/2020   Stable, pt to continue current medical treatment  - diet

## 2020-07-15 NOTE — Assessment & Plan Note (Signed)
Age and sex appropriate education and counseling updated with regular exercise and diet Referrals for preventative services - none needed Immunizations addressed - for pneumovax, pt plans for covid booster soon Smoking counseling  - none needed Evidence for depression or other mood disorder - none significant Most recent labs reviewed. I have personally reviewed and have noted: 1) the patient's medical and social history 2) The patient's current medications and supplements 3) The patient's height, weight, and BMI have been recorded in the chart

## 2020-07-15 NOTE — Assessment & Plan Note (Signed)
?   Fissure vs other, for anusol hc asd, f/u GI - has appt July 2022

## 2020-07-15 NOTE — Assessment & Plan Note (Addendum)
Lab Results  Component Value Date   VITAMINB12 247 07/10/2020   Low normal to start oral replacement - b12 1000 mcg qd

## 2020-07-15 NOTE — Assessment & Plan Note (Signed)
Last vitamin D Lab Results  Component Value Date   VD25OH 31.02 07/10/2020   Low normal, to start oral replacement

## 2020-07-15 NOTE — Assessment & Plan Note (Signed)
Exam benign, also for urine cx

## 2020-08-08 ENCOUNTER — Ambulatory Visit: Payer: 59 | Admitting: Gastroenterology

## 2020-10-17 ENCOUNTER — Encounter (HOSPITAL_COMMUNITY): Payer: Self-pay

## 2020-10-17 ENCOUNTER — Other Ambulatory Visit: Payer: Self-pay

## 2020-10-17 ENCOUNTER — Emergency Department (HOSPITAL_COMMUNITY)
Admission: EM | Admit: 2020-10-17 | Discharge: 2020-10-17 | Disposition: A | Discharge status: Home / Self Care | Payer: 59 | Attending: Emergency Medicine | Admitting: Emergency Medicine

## 2020-10-17 DIAGNOSIS — J029 Acute pharyngitis, unspecified: Secondary | ICD-10-CM | POA: Insufficient documentation

## 2020-10-17 DIAGNOSIS — Z7951 Long term (current) use of inhaled steroids: Secondary | ICD-10-CM | POA: Insufficient documentation

## 2020-10-17 DIAGNOSIS — R112 Nausea with vomiting, unspecified: Secondary | ICD-10-CM | POA: Diagnosis present

## 2020-10-17 DIAGNOSIS — Z8616 Personal history of COVID-19: Secondary | ICD-10-CM | POA: Insufficient documentation

## 2020-10-17 DIAGNOSIS — Z87891 Personal history of nicotine dependence: Secondary | ICD-10-CM | POA: Insufficient documentation

## 2020-10-17 DIAGNOSIS — R21 Rash and other nonspecific skin eruption: Secondary | ICD-10-CM | POA: Diagnosis not present

## 2020-10-17 DIAGNOSIS — J45909 Unspecified asthma, uncomplicated: Secondary | ICD-10-CM | POA: Diagnosis not present

## 2020-10-17 DIAGNOSIS — K219 Gastro-esophageal reflux disease without esophagitis: Secondary | ICD-10-CM | POA: Diagnosis not present

## 2020-10-17 HISTORY — DX: Bipolar II disorder: F31.81

## 2020-10-17 LAB — MAGNESIUM: Magnesium: 2 mg/dL (ref 1.7–2.4)

## 2020-10-17 LAB — BASIC METABOLIC PANEL
Anion gap: 8 (ref 5–15)
BUN: 18 mg/dL (ref 6–20)
CO2: 27 mmol/L (ref 22–32)
Calcium: 9.1 mg/dL (ref 8.9–10.3)
Chloride: 104 mmol/L (ref 98–111)
Creatinine, Ser: 0.71 mg/dL (ref 0.44–1.00)
GFR, Estimated: >60 mL/min (ref >60)
Glucose, Bld: 87 mg/dL (ref 70–99)
Potassium: 4.3 mmol/L (ref 3.5–5.1)
Sodium: 139 mmol/L (ref 135–145)

## 2020-10-17 MED ORDER — DIPHENHYDRAMINE HCL 25 MG PO CAPS
25.0000 mg | ORAL_CAPSULE | Freq: Once | ORAL | Status: AC
  Administered 2020-10-17: 25 mg via ORAL
  Filled 2020-10-17: qty 1

## 2020-10-17 MED ORDER — OMEPRAZOLE 20 MG PO CPDR: 20.0000 mg | DELAYED_RELEASE_CAPSULE | Freq: Every day | ORAL | 2 refills | Status: DC

## 2020-10-17 MED ORDER — PANTOPRAZOLE SODIUM 40 MG PO TBEC
40.0000 mg | DELAYED_RELEASE_TABLET | Freq: Once | ORAL | Status: AC
  Administered 2020-10-17: 40 mg via ORAL
  Filled 2020-10-17: qty 1

## 2020-10-17 MED ORDER — TRIAMCINOLONE ACETONIDE 0.1 % EX CREA: 1.0000 "application " | TOPICAL_CREAM | Freq: Two times a day (BID) | CUTANEOUS | 0 refills | Status: DC

## 2020-10-17 MED ORDER — ONDANSETRON 4 MG PO TBDP
4.0000 mg | ORAL_TABLET | Freq: Once | ORAL | Status: AC
  Administered 2020-10-17: 4 mg via ORAL
  Filled 2020-10-17: qty 1

## 2020-10-17 NOTE — ED Provider Notes (Signed)
[facility] DEPT Provider Note   CSN: [id number] Arrival date & time: 10/17/20  [id number]     History Chief Complaint  Patient presents with   Rash    [name] is a 35 y.o. female history significant for bipolar, borderline personality recently hospitalized at [facility] psychiatric facility in [place] who was released 2 to 3 days ago who is reporting new onset rash on her chest legs and arms after starting new medication Depakote.  Patient is also endorsing worsening of her existing heartburn symptoms, patient reports that she has sour taste in the back of her throat sore throat, nausea, and one instance of vomiting, as well as a burning chest pain associated with her other reflux symptoms.  Additionally patient has history of asthma, multiple episodes of bronchitis who does report that she has had a cough, and some difficulty breathing over the last day or 2.  Patient does have an albuterol inhaler at home reports that she used it once in the last day, without much relief.  For her rash patient reports that she has tried Lubriderm, and hydrocortisone cream while she was hospitalized, but has not done anything for the last 2 to 3 days, and reports that she has been scratching heavily at the rash.  Patient reports that she has been taking Pepto-Bismol for her reflux that she normally takes omeprazole which helps to control it well.   Rash Associated symptoms: diarrhea, nausea, shortness of breath, sore throat and vomiting       Past Medical History:  Diagnosis Date   Allergy    Anxiety    Asthma    Bipolar 2 disorder ([place])    Borderline personality disorder ([place])    Depression    Eczema    Former smoker    Obesity    PTSD (post-traumatic stress disorder)    Urticaria     Patient Active Problem List   Diagnosis Date Noted   Anal pain 07/15/2020   Urinary frequency 07/15/2020   Left buttock abscess 07/15/2020   Vitamin D deficiency 07/15/2020    B12 deficiency 07/15/2020   MDD (major depressive disorder), recurrent severe, without psychosis ([place]) 04/26/2020   COVID-19 virus infection 01/07/2020   PTSD (post-traumatic stress disorder) 01/03/2020   Allergic asthma 01/13/2018   Rash 01/13/2018   Allergic rhinitis 03/12/2017   Hyperglycemia 01/01/2017   Allergy    Former smoker    Anxiety 10/26/2016   Bronchopneumonia 10/13/2016   Acute asthma exacerbation 10/13/2016   Hypokalemia 10/13/2016   Encounter for well adult exam with abnormal findings 04/17/2016   Class 2 obesity due to excess calories without serious comorbidity with body mass index (BMI) of 37.0 to 37.9 in adult 04/17/2016   Asthma 03/13/2016   Borderline personality disorder ([place]) 03/13/2016   Eczema 03/13/2016   Cough 01/28/2016   Antibiotic-associated diarrhea 01/28/2016   Mood disorder ([place]) 03/04/2013    Past Surgical History:  Procedure Laterality Date   Thumb surgery Left      OB History   No obstetric history on file.     Family History  Problem Relation Age of Onset   Healthy Mother    Healthy Father    Alzheimer's disease Maternal Grandmother    Skin cancer Paternal Grandmother    Allergic rhinitis Paternal Grandmother    Asthma Paternal Grandmother    Angioedema Neg Hx    Eczema Neg Hx    Urticaria Neg Hx     Social History  Tobacco Use   Smoking status: Former    Packs/day: 0.50    Years: 9.00    Pack years: 4.50    Types: Cigarettes    Quit date: 12/04/2015    Years since quitting: 4.8   Smokeless tobacco: Never  Vaping Use   Vaping Use: Former  Substance Use Topics   Alcohol use: Yes    Alcohol/week: 8.0 standard drinks    Types: 8 Cans of beer per week    Comment: occ   Drug use: Yes    Frequency: 4.0 times per week    Types: Marijuana    Comment: Last smoked this morning-03/09/19    Home Medications Prior to Admission medications   Medication Sig Start Date End Date Taking? Authorizing Provider  omeprazole  (PRILOSEC) 20 MG capsule Take 1 capsule (20 mg total) by mouth daily. 10/17/20  Yes Babe Clenney H, PA-C  triamcinolone cream (KENALOG) 0.1 % Apply 1 application topically 2 (two) times daily. Caution applying to breast tissue -- limit to one week total and apply only to areas with rash. Limit use to 2-3 weeks elsewhere on body. 10/17/20  Yes Jusiah Aguayo H, PA-C  albuterol (PROVENTIL) (2.5 MG/3ML) 0.083% nebulizer solution Take 3 mLs (2.5 mg total) by nebulization every 6 (six) hours as needed for wheezing or shortness of breath. Patient not taking: Reported on 07/13/2020 05/10/20   Valentina Shaggy, MD  albuterol (VENTOLIN HFA) 108 (90 Base) MCG/ACT inhaler USE 2 PUFFS EVERY 6 HOURS AS NEEDED FOR WHEEZING OR SHORTNESS OF BREATH 05/10/20   Valentina Shaggy, MD  albuterol (VENTOLIN HFA) 108 (90 Base) MCG/ACT inhaler Inhale 2 puffs into the lungs every 6 (six) hours as needed for wheezing or shortness of breath. Patient not taking: Reported on 07/13/2020 05/15/20   Valentina Shaggy, MD  ALPRAZolam Duanne Moron) 1 MG tablet Take 1 tablet (1 mg total) by mouth 2 (two) times daily as needed for anxiety. 01/03/20   Biagio Borg, MD  budesonide-formoterol [facility]) 160-4.5 MCG/ACT inhaler Inhale 2 puffs into the lungs 2 (two) times daily. 05/10/20   Valentina Shaggy, MD  diphenoxylate-atropine (LOMOTIL) 2.5-0.025 MG tablet Take 1 tablet by mouth 4 (four) times daily as needed for diarrhea or loose stools. Patient not taking: No sig reported 01/03/20   Biagio Borg, MD  fluconazole (DIFLUCAN) 150 MG tablet 1 tab by mouth every 3 days as needed 07/10/20   Biagio Borg, MD  FLUoxetine (PROZAC) 40 MG capsule Take 1 capsule (40 mg total) by mouth daily. Patient taking differently: Take 40 mg by mouth in the morning. 10/15/16   Regalado, Belkys A, MD  fluticasone (FLONASE) 50 MCG/ACT nasal spray Place 2 sprays into both nostrils daily. Patient taking differently: Place 2 sprays into both nostrils  at bedtime. 05/10/20   Valentina Shaggy, MD  gabapentin (NEURONTIN) 300 MG capsule Take 1 capsule (300 mg total) by mouth 3 (three) times daily. Patient taking differently: Take 600 mg by mouth 3 (three) times daily. 05/01/14   Wendie Agreste, MD  gabapentin (NEURONTIN) 600 MG tablet Take 600 mg by mouth 3 (three) times daily. 05/15/20   [provider]  hydrocortisone (ANUSOL-HC) 25 MG suppository Place 1 suppository (25 mg total) rectally 2 (two) times daily. 07/10/20   Biagio Borg, MD  hydrOXYzine (ATARAX/VISTARIL) 50 MG tablet Take 1 tablet (50 mg total) by mouth in the morning and at bedtime. 1 tablet in the morning. 2 tablets at night 09/07/19  Valentina Shaggy, MD  levonorgestrel-ethinyl estradiol (NORDETTE) 0.15-30 MG-MCG tablet 0.15 mg.    [provider]  montelukast (SINGULAIR) 10 MG tablet Take 1 tablet (10 mg total) by mouth at bedtime. 05/10/20   Valentina Shaggy, MD  nystatin (MYCOSTATIN/NYSTOP) powder APPLY TO THE AFFECTED AREA TWICE DAILY Patient taking differently: Apply 1 application topically 2 (two) times daily. APPLY TO THE AFFECTED AREA TWICE DAILY 06/02/19   Biagio Borg, MD  nystatin-triamcinolone ointment (MYCOLOG) APPLY TO AFFECTED AREA TWICE DAILY. Patient taking differently: Apply 1 application topically 2 (two) times daily. APPLY TO AFFECTED AREA TWICE DAILY. 06/28/20   Biagio Borg, MD  potassium chloride SA (KLOR-CON) 20 MEQ tablet Take 20 mEq by mouth daily. 06/09/20   [provider]  prazosin (MINIPRESS) 1 MG capsule Take 1-3 mg by mouth See admin instructions. Take 1 tablet in the morning and 3 tablets before bedtime. 05/02/20   [provider]  traZODone (DESYREL) 100 MG tablet Take 100 mg by mouth at bedtime as needed for sleep. 05/02/20   [provider]  vitamin B-12 (CYANOCOBALAMIN) 1000 MCG tablet Take 1 tablet (1,000 mcg total) by mouth daily. Patient not taking: Reported on 07/13/2020 06/23/19   Biagio Borg, MD    Allergies    Benztropine and Other  Review of Systems   Review of Systems  HENT:  Positive for sore throat.   Respiratory:  Positive for cough and shortness of breath.   Gastrointestinal:  Positive for diarrhea, nausea and vomiting.  Skin:  Positive for rash.  All other systems reviewed and are negative.  Physical Exam Updated Vital Signs BP 113/82   Pulse 93   Temp 98.1 F (36.7 C) (Oral)   Resp 15   Ht '5\' 2"'$  (1.575 m)   Wt 93 kg   SpO2 98%   BMI 37.49 kg/m   Physical Exam Vitals and nursing note reviewed.  Constitutional:      General: She is not in acute distress.    Appearance: Normal appearance.  HENT:     Head: Normocephalic and atraumatic.     Nose: Congestion present.     Mouth/Throat:     Mouth: Mucous membranes are moist.     Pharynx: No oropharyngeal exudate or posterior oropharyngeal erythema.  Eyes:     General:        Right eye: No discharge.        Left eye: No discharge.  Cardiovascular:     Rate and Rhythm: Normal rate and regular rhythm.     Heart sounds: No murmur heard.   No friction rub. No gallop.  Pulmonary:     Effort: Pulmonary effort is normal. No respiratory distress.     Comments: Clear breath sounds through all lung fields with minimal wheezing, good excursion. Abdominal:     General: Bowel sounds are normal.     Palpations: Abdomen is soft.     Tenderness: There is no abdominal tenderness.  Skin:    General: Skin is warm and dry.     Capillary Refill: Capillary refill takes less than 2 seconds.     Comments: Patchy red erythematous rash overlying breasts, both arms, and lower legs with evidence of multiple excoriations from scratching.  Neurological:     Mental Status: She is alert and oriented to person, place, and time.  Psychiatric:        Mood and Affect: Mood normal.        Behavior: Behavior  normal.    ED Results / Procedures / Treatments   Labs (all labs ordered are listed, but only abnormal results are  displayed) Labs Reviewed  BASIC METABOLIC PANEL  MAGNESIUM    EKG EKG Interpretation  Date/Time:  Thursday October 17 2020 07:20:03 EDT Ventricular Rate:  84 PR Interval:  156 QRS Duration: 94 QT Interval:  414 QTC Calculation: 490 R Axis:   82 Text Interpretation: Sinus rhythm Borderline prolonged QT interval since last tracing no significant change Confirmed by Malvin Johns [phone]) on 10/17/2020 7:24:44 AM  Radiology No results found.  Procedures Procedures   Medications Ordered in ED Medications  diphenhydrAMINE (BENADRYL) capsule 25 mg (25 mg Oral Given 10/17/20 0721)  pantoprazole (PROTONIX) EC tablet 40 mg (40 mg Oral Given 10/17/20 0721)  ondansetron (ZOFRAN-ODT) disintegrating tablet 4 mg (4 mg Oral Given 10/17/20 [id number])    ED Course  I have reviewed the triage vital signs and the nursing notes.  Pertinent labs & imaging results that were available during my care of the patient were reviewed by me and considered in my medical decision making (see chart for details).    MDM Rules/Calculators/A&P                         Rash: Rash is limited to torso, arms, and legs.  No evidence of lesions in mouth, patient denies lesions in vulva.  Rash is erythematous, coalescing, macular with excoriations, morphology does not fit with SJS/TEN.  Recommend limiting scratching to prevent secondary infection.  Patient is already taking Atarax, encouraged patient to continue taking Atarax.  In addition we will prescribe triamcinolone 1% cream for application to affected areas, urged limited application to breasts as this can thin the tissue.  In addition recommend Sarna anti-itch lotion to affected areas to limit itching.  Patient to discontinue Depakote and speak with her psychiatrist about another medication that she can take in its place.  Sore throat: No evidence of redness, irritation in throat, no evidence of stridor, tonsils are 1+, symmetrical, and without exudate.  Favor  irritation of throat secondary to uncontrolled reflux.  Recommend over-the-counter lozenges as needed for symptomatic relief, and managing underlying issue of reflux.  Reflux: Patient with clinical improvement, more comfortable secondary to omeprazole.  We will provide prescription for omeprazole at this time.  Patient with stable QT on x-ray compared to previous, normal potassium and normal magnesium.  Discussed trigger foods, not eating before bed, recommended follow-up with primary care/GI.  Patient clinically stable, and agrees with plan.  Discharged in stable condition.  Return precautions were given. Final Clinical Impression(s) / ED Diagnoses Final diagnoses:  Rash  Sore throat  Gastroesophageal reflux disease, unspecified whether esophagitis present    Rx / DC Orders ED Discharge Orders          Ordered    triamcinolone cream (KENALOG) 0.1 %  2 times daily        10/17/20 0946    omeprazole (PRILOSEC) 20 MG capsule  Daily        10/17/20 0946             Anselmo Pickler, PA-C 10/17/20 [id number]    Malvin Johns, MD 10/17/20 1501

## 2020-10-17 NOTE — ED Triage Notes (Signed)
Aaox6 female. Pt reports that was started on depakote about 1 week ago for bipolar d/o and noticed that rash/itching started 2 hrs after taking first dose. Pt reports rash to legs, chest and arms. Pt also c/o blister to head, intermittent tightness in chest, sore throat, n/v and heartburn. Reports that has been taking benadryl for rash/itching and pepto for heartburn with mild relief of sxs.

## 2020-10-17 NOTE — Discharge Instructions (Addendum)
Follow-up with your psychiatrist to discuss new medication at your earliest convenience. Please return if your rash worsens or spreads to your mouth.  Please begin taking your omeprazole to help control your reflux. I recommend you follow up to discuss making an appointment with a GI doctor if your symptoms continue or worsen.

## 2020-10-23 ENCOUNTER — Ambulatory Visit (INDEPENDENT_AMBULATORY_CARE_PROVIDER_SITE_OTHER): Payer: 59 | Admitting: Emergency Medicine

## 2020-10-23 ENCOUNTER — Other Ambulatory Visit: Payer: Self-pay

## 2020-10-23 ENCOUNTER — Encounter: Payer: Self-pay | Admitting: Emergency Medicine

## 2020-10-23 VITALS — BP 112/70 | HR 65 | Temp 98.5°F | Ht 62.0 in | Wt 213.0 lb

## 2020-10-23 DIAGNOSIS — Z23 Encounter for immunization: Secondary | ICD-10-CM

## 2020-10-23 DIAGNOSIS — L089 Local infection of the skin and subcutaneous tissue, unspecified: Secondary | ICD-10-CM | POA: Diagnosis not present

## 2020-10-23 DIAGNOSIS — T148XXA Other injury of unspecified body region, initial encounter: Secondary | ICD-10-CM

## 2020-10-23 MED ORDER — FLUCONAZOLE 150 MG PO TABS: 150.0000 mg | ORAL_TABLET | Freq: Once | ORAL | 0 refills | Status: AC

## 2020-10-23 MED ORDER — DOXYCYCLINE HYCLATE 100 MG PO TABS
100.0000 mg | ORAL_TABLET | Freq: Two times a day (BID) | ORAL | 0 refills | Status: AC
Start: 2020-10-23 — End: 2020-10-30

## 2020-10-23 MED ORDER — MUPIROCIN CALCIUM 2 % EX CREA: 1.0000 "application " | TOPICAL_CREAM | Freq: Two times a day (BID) | CUTANEOUS | 0 refills | Status: DC

## 2020-10-23 NOTE — Progress Notes (Signed)
[name] 35 y.o.   Chief Complaint  Patient presents with   Rash    On left leg, bump is red and swollen    HISTORY OF PRESENT ILLNESS: Visit today for an acute problem. This is a 35 y.o. female complaining of possible wound infection to left lower leg.  Rash Pertinent negatives include no congestion, cough, diarrhea, fever, shortness of breath, sore throat or vomiting.    Prior to Admission medications   Medication Sig Start Date End Date Taking? Authorizing Provider  albuterol (PROVENTIL) (2.5 MG/3ML) 0.083% nebulizer solution Take 3 mLs (2.5 mg total) by nebulization every 6 (six) hours as needed for wheezing or shortness of breath. 05/10/20  Yes Valentina Shaggy, MD  albuterol (VENTOLIN HFA) 108 (90 Base) MCG/ACT inhaler USE 2 PUFFS EVERY 6 HOURS AS NEEDED FOR WHEEZING OR SHORTNESS OF BREATH 05/10/20  Yes Valentina Shaggy, MD  albuterol (VENTOLIN HFA) 108 (90 Base) MCG/ACT inhaler Inhale 2 puffs into the lungs every 6 (six) hours as needed for wheezing or shortness of breath. 05/15/20  Yes Valentina Shaggy, MD  ALPRAZolam Duanne Moron) 1 MG tablet Take 1 tablet (1 mg total) by mouth 2 (two) times daily as needed for anxiety. 01/03/20  Yes Biagio Borg, MD  budesonide-formoterol [facility]) 160-4.5 MCG/ACT inhaler Inhale 2 puffs into the lungs 2 (two) times daily. 05/10/20  Yes Valentina Shaggy, MD  fluticasone [facility]) 50 MCG/ACT nasal spray Place 2 sprays into both nostrils daily. Patient taking differently: Place 2 sprays into both nostrils at bedtime. 05/10/20  Yes Valentina Shaggy, MD  gabapentin (NEURONTIN) 600 MG tablet Take 600 mg by mouth 3 (three) times daily. 05/15/20  Yes [provider]  hydrocortisone (ANUSOL-HC) 25 MG suppository Place 1 suppository (25 mg total) rectally 2 (two) times daily. 07/10/20  Yes Biagio Borg, MD  hydrOXYzine (ATARAX/VISTARIL) 50 MG tablet Take 1 tablet (50 mg total) by mouth in the morning and at bedtime. 1 tablet in  the morning. 2 tablets at night 09/07/19  Yes Valentina Shaggy, MD  levonorgestrel-ethinyl estradiol (NORDETTE) 0.15-30 MG-MCG tablet 0.15 mg.   Yes [provider]  montelukast (SINGULAIR) 10 MG tablet Take 1 tablet (10 mg total) by mouth at bedtime. 05/10/20  Yes Valentina Shaggy, MD  nystatin-triamcinolone ointment (MYCOLOG) APPLY TO AFFECTED AREA TWICE DAILY. Patient taking differently: Apply 1 application topically 2 (two) times daily. APPLY TO AFFECTED AREA TWICE DAILY. 06/28/20  Yes Biagio Borg, MD  OLANZapine (ZYPREXA) 10 MG tablet Take 10 mg by mouth at bedtime.   Yes [provider]  omeprazole (PRILOSEC) 20 MG capsule Take 1 capsule (20 mg total) by mouth daily. 10/17/20  Yes Prosperi, Christian H, PA-C  prazosin (MINIPRESS) 1 MG capsule Take 1-3 mg by mouth See admin instructions. Take 1 tablet in the morning and 3 tablets before bedtime. 05/02/20  Yes [provider]  traZODone (DESYREL) 100 MG tablet Take 100 mg by mouth at bedtime as needed for sleep. 05/02/20  Yes [provider]  triamcinolone cream (KENALOG) 0.1 % Apply 1 application topically 2 (two) times daily. Caution applying to breast tissue -- limit to one week total and apply only to areas with rash. Limit use to 2-3 weeks elsewhere on body. 10/17/20  Yes Prosperi, Christian H, PA-C  diphenoxylate-atropine (LOMOTIL) 2.5-0.025 MG tablet Take 1 tablet by mouth 4 (four) times daily as needed for diarrhea or loose stools. Patient not taking: No sig reported 01/03/20  Biagio Borg, MD  fluconazole (DIFLUCAN) 150 MG tablet 1 tab by mouth every 3 days as needed Patient not taking: Reported on 10/23/2020 07/10/20   Biagio Borg, MD  FLUoxetine (PROZAC) 40 MG capsule Take 1 capsule (40 mg total) by mouth daily. Patient not taking: Reported on 10/23/2020 10/15/16   Regalado, Jerald Kief A, MD  gabapentin (NEURONTIN) 300 MG capsule Take 1 capsule (300 mg total) by mouth 3 (three) times daily. Patient not  taking: Reported on 10/23/2020 05/01/14   Wendie Agreste, MD  nystatin (MYCOSTATIN/NYSTOP) powder APPLY TO THE AFFECTED AREA TWICE DAILY Patient not taking: Reported on 10/23/2020 06/02/19   Biagio Borg, MD  potassium chloride SA (KLOR-CON) 20 MEQ tablet Take 20 mEq by mouth daily. Patient not taking: Reported on 10/23/2020 06/09/20   [provider]  vitamin B-12 (CYANOCOBALAMIN) 1000 MCG tablet Take 1 tablet (1,000 mcg total) by mouth daily. Patient not taking: No sig reported 06/23/19   Biagio Borg, MD    Allergies  Allergen Reactions   Benztropine Other (See Comments)    Report hypersensitivity Report hypersensitivity    Other     Opiates- history of dependency     Patient Active Problem List   Diagnosis Date Noted   Vitamin D deficiency 07/15/2020   B12 deficiency 07/15/2020   MDD (major depressive disorder), recurrent severe, without psychosis ([place]) 04/26/2020   PTSD (post-traumatic stress disorder) 01/03/2020   Allergic asthma 01/13/2018   Allergic rhinitis 03/12/2017   Class 2 obesity due to excess calories without serious comorbidity with body mass index (BMI) of 37.0 to 37.9 in adult 04/17/2016   Asthma 03/13/2016   Borderline personality disorder ([place]) 03/13/2016   Mood disorder ([place]) 03/04/2013    Past Medical History:  Diagnosis Date   Allergy    Anxiety    Asthma    Bipolar 2 disorder ([place])    Borderline personality disorder ([place])    Depression    Eczema    Former smoker    Obesity    PTSD (post-traumatic stress disorder)    Urticaria     Past Surgical History:  Procedure Laterality Date   Thumb surgery Left     Social History   Socioeconomic History   Marital status: Single    Spouse name: Not on file   Number of children: 0   Years of education: 13   Highest education level: Not on file  Occupational History   Not on file  Tobacco Use   Smoking status: Former    Packs/day: 0.50    Years: 9.00    Pack years: 4.50    Types:  Cigarettes    Quit date: 12/04/2015    Years since quitting: 4.8   Smokeless tobacco: Never  Vaping Use   Vaping Use: Former  Substance and Sexual Activity   Alcohol use: Yes    Alcohol/week: 8.0 standard drinks    Types: 8 Cans of beer per week    Comment: occ   Drug use: Yes    Frequency: 4.0 times per week    Types: Marijuana    Comment: Last smoked this morning-03/09/19   Sexual activity: Yes    Birth control/protection: Pill  Other Topics Concern   Not on file  Social History Narrative   Fun: Play video games and watch movies.   Denies abuse and feels safe at home.   Social Determinants of Health   Financial Resource Strain: Not on file  Food Insecurity: Not  on file  Transportation Needs: Not on file  Physical Activity: Not on file  Stress: Not on file  Social Connections: Not on file  Intimate Partner Violence: Not on file    Family History  Problem Relation Age of Onset   Healthy Mother    Healthy Father    Alzheimer's disease Maternal Grandmother    Skin cancer Paternal Grandmother    Allergic rhinitis Paternal Grandmother    Asthma Paternal Grandmother    Angioedema Neg Hx    Eczema Neg Hx    Urticaria Neg Hx      Review of Systems  Constitutional: Negative.  Negative for chills and fever.  HENT: Negative.  Negative for congestion and sore throat.   Respiratory: Negative.  Negative for cough and shortness of breath.   Cardiovascular:  Negative for chest pain and palpitations.  Gastrointestinal:  Negative for abdominal pain, diarrhea, nausea and vomiting.  Genitourinary: Negative.   Musculoskeletal: Negative.   Skin:  Positive for rash.  Neurological:  Negative for dizziness and headaches.  All other systems reviewed and are negative.  Vitals:   10/23/20 1321  BP: 112/70  Pulse: 65  Temp: 98.5 F (36.9 C)  SpO2: 97%    Physical Exam Vitals reviewed.  Constitutional:      Appearance: Normal appearance.  HENT:     Head: Normocephalic.   Eyes:     Extraocular Movements: Extraocular movements intact.     Pupils: Pupils are equal, round, and reactive to light.  Cardiovascular:     Rate and Rhythm: Normal rate.  Pulmonary:     Effort: Pulmonary effort is normal.  Musculoskeletal:     Cervical back: Normal range of motion.  Skin:    General: Skin is warm and dry.     Capillary Refill: Capillary refill takes less than 2 seconds.     Comments: Left lower leg shows erythematous circular area with central darker area and surrounding swelling.  See picture below.  Neurological:     General: No focal deficit present.     Mental Status: She is alert and oriented to person, place, and time.  Psychiatric:        Mood and Affect: Mood normal.        Behavior: Behavior normal.      ASSESSMENT & PLAN: Alesandra was seen today for rash.  Diagnoses and all orders for this visit:  Wound infection Comments: Suspected MRSA infection Orders: -     doxycycline (VIBRA-TABS) 100 MG tablet; Take 1 tablet (100 mg total) by mouth 2 (two) times daily for 7 days. -     mupirocin cream (BACTROBAN) 2 %; Apply 1 application topically 2 (two) times daily. -     fluconazole (DIFLUCAN) 150 MG tablet; Take 1 tablet (150 mg total) by mouth once for 1 dose.  Flu vaccine need -     Flu Vaccine QUAD 10mo+IM (Fluarix, Fluzone & Alfiuria Quad PF)  Patient Instructions  Wound Infection A wound infection happens when germs start to grow in a wound. Germs that cause wound infections are most often bacteria. Other types of infections can occur as well. An infection can cause the wound to break open. Wound infections need treatment. If a wound infection is not treated, problems can happen. What are the causes? Most often caused by germs (bacteria) that grow in a wound. Other germs, such as yeast and funguses, can also cause wound infections. What increases the risk? Having a weak body defense system (immune  system). Having diabetes. Taking certain  medicines (steroids) for a long time. Smoking. Being an older person. Being overweight. Taking certain medicines for cancer treatment. What are the signs or symptoms? Having more redness, swelling, or pain at the wound site. Having more blood or fluid at the wound site. A bad smell coming from a wound or bandage (dressing). Having a fever. Feeling very tired. Having warmth at or around the wound. Having pus at the wound site. How is this treated? This condition is most often treated with an antibiotic medicine. The infection should improve 24-48 hours after you start antibiotics. After 24-48 hours, redness around the wound should stop spreading. The wound should also be less painful. Follow these instructions at home: Medicines Take or apply over-the-counter and prescription medicines only as told by your doctor. If you were prescribed an antibiotic medicine, take or apply it as told by your doctor. Do not stop using the antibiotic even if you start to feel better. Wound care  Clean the wound each day, or as told by your doctor. Wash the wound with mild soap and water. Rinse the wound with water to remove all soap. Pat the wound dry with a clean towel. Do not rub it. Follow instructions from your doctor about how to take care of your wound. Make sure you: Wash your hands with soap and water before and after you change your bandage. If you cannot use soap and water, use hand sanitizer. Change your bandage as told by your doctor. Leave stitches (sutures), skin glue, or skin tape (adhesive) strips in place if your wound has been closed. They may need to stay in place for 2 weeks or longer. If tape strips get loose and curl up, you may trim the loose edges. Do not remove tape strips completely unless your doctor says it is okay. Some wounds are left open to heal on their own. Check your wound every day for signs of infection. Watch for: More redness, swelling, or pain. More fluid or  blood. Warmth. Pus or a bad smell. General instructions Keep the bandage dry until your doctor says it can be removed. Do not take baths, swim, or use a hot tub until your doctor approves. Ask your doctor if you may take showers. You may only be allowed to take sponge baths. Raise (elevate) the injured area above the level of your heart while you are sitting or lying down. Do not scratch or pick at the wound. Keep all follow-up visits as told by your doctor. This is important. Contact a doctor if: Medicine does not help your pain. You have more redness, swelling, or pain around your wound. You have more fluid or blood coming from your wound. Your wound feels warm to the touch. You have pus coming from your wound. You notice a bad smell coming from your wound or your bandage. Your wound that was closed breaks open. Get help right away if: You have a red streak going away from your wound. You have a fever. Summary A wound infection happens when germs start to grow in a wound. This condition is usually treated with an antibiotic medicine. Follow instructions from your doctor about how to take care of your wound. Contact a doctor if your wound infection does not start to get better in 24-48 hours, or your symptoms get worse. Keep all follow-up visits as told by your doctor. This is important. This information is not intended to replace advice given to you by your health  care provider. Make sure you discuss any questions you have with your health care provider. Document Revised: 08/31/2017 Document Reviewed: 08/31/2017 Elsevier Patient Education  2022 [place], [place] [facility]

## 2020-10-23 NOTE — Patient Instructions (Signed)
Wound Infection A wound infection happens when germs start to grow in a wound. Germs that cause wound infections are most often bacteria. Other types of infections can occur as well. An infection can cause the wound to break open. Wound infections needtreatment. If a wound infection is not treated, problems can happen. What are the causes? Most often caused by germs (bacteria) that grow in a wound. Other germs, such as yeast and funguses, can also cause wound infections. What increases the risk? Having a weak body defense system (immune system). Having diabetes. Taking certain medicines (steroids) for a long time. Smoking. Being an older person. Being overweight. Taking certain medicines for cancer treatment. What are the signs or symptoms? Having more redness, swelling, or pain at the wound site. Having more blood or fluid at the wound site. A bad smell coming from a wound or bandage (dressing). Having a fever. Feeling very tired. Having warmth at or around the wound. Having pus at the wound site. How is this treated? This condition is most often treated with an antibiotic medicine. The infection should improve 24-48 hours after you start antibiotics. After 24-48 hours, redness around the wound should stop spreading. The wound should also be less painful. Follow these instructions at home: Medicines Take or apply over-the-counter and prescription medicines only as told by your doctor. If you were prescribed an antibiotic medicine, take or apply it as told by your doctor. Do not stop using the antibiotic even if you start to feel better. Wound care  Clean the wound each day, or as told by your doctor. Wash the wound with mild soap and water. Rinse the wound with water to remove all soap. Pat the wound dry with a clean towel. Do not rub it. Follow instructions from your doctor about how to take care of your wound. Make sure you: Wash your hands with soap and water before and after  you change your bandage. If you cannot use soap and water, use hand sanitizer. Change your bandage as told by your doctor. Leave stitches (sutures), skin glue, or skin tape (adhesive) strips in place if your wound has been closed. They may need to stay in place for 2 weeks or longer. If tape strips get loose and curl up, you may trim the loose edges. Do not remove tape strips completely unless your doctor says it is okay. Some wounds are left open to heal on their own. Check your wound every day for signs of infection. Watch for: More redness, swelling, or pain. More fluid or blood. Warmth. Pus or a bad smell.  General instructions Keep the bandage dry until your doctor says it can be removed. Do not take baths, swim, or use a hot tub until your doctor approves. Ask your doctor if you may take showers. You may only be allowed to take sponge baths. Raise (elevate) the injured area above the level of your heart while you are sitting or lying down. Do not scratch or pick at the wound. Keep all follow-up visits as told by your doctor. This is important. Contact a doctor if: Medicine does not help your pain. You have more redness, swelling, or pain around your wound. You have more fluid or blood coming from your wound. Your wound feels warm to the touch. You have pus coming from your wound. You notice a bad smell coming from your wound or your bandage. Your wound that was closed breaks open. Get help right away if: You have a red streak   going away from your wound. You have a fever. Summary A wound infection happens when germs start to grow in a wound. This condition is usually treated with an antibiotic medicine. Follow instructions from your doctor about how to take care of your wound. Contact a doctor if your wound infection does not start to get better in 24-48 hours, or your symptoms get worse. Keep all follow-up visits as told by your doctor. This is important. This information is not  intended to replace advice given to you by your health care provider. Make sure you discuss any questions you have with your healthcare provider. Document Revised: 08/31/2017 Document Reviewed: 08/31/2017 Elsevier Patient Education  2022 [employer]  

## 2020-11-07 ENCOUNTER — Ambulatory Visit: Payer: Self-pay | Admitting: Allergy & Immunology

## 2020-11-07 DIAGNOSIS — J309 Allergic rhinitis, unspecified: Secondary | ICD-10-CM

## 2020-12-15 ENCOUNTER — Emergency Department (HOSPITAL_COMMUNITY): Payer: 59

## 2020-12-15 ENCOUNTER — Encounter (HOSPITAL_COMMUNITY): Payer: Self-pay | Admitting: Emergency Medicine

## 2020-12-15 ENCOUNTER — Other Ambulatory Visit: Payer: Self-pay

## 2020-12-15 ENCOUNTER — Emergency Department (HOSPITAL_COMMUNITY)
Admission: EM | Admit: 2020-12-15 | Discharge: 2020-12-15 | Disposition: A | Discharge status: Home / Self Care | Payer: 59 | Attending: Emergency Medicine | Admitting: Emergency Medicine

## 2020-12-15 DIAGNOSIS — S6991XA Unspecified injury of right wrist, hand and finger(s), initial encounter: Secondary | ICD-10-CM | POA: Diagnosis present

## 2020-12-15 DIAGNOSIS — Z87891 Personal history of nicotine dependence: Secondary | ICD-10-CM | POA: Diagnosis not present

## 2020-12-15 DIAGNOSIS — S6392XA Sprain of unspecified part of left wrist and hand, initial encounter: Secondary | ICD-10-CM | POA: Insufficient documentation

## 2020-12-15 DIAGNOSIS — Z7951 Long term (current) use of inhaled steroids: Secondary | ICD-10-CM | POA: Insufficient documentation

## 2020-12-15 DIAGNOSIS — X509XXA Other and unspecified overexertion or strenuous movements or postures, initial encounter: Secondary | ICD-10-CM | POA: Diagnosis not present

## 2020-12-15 DIAGNOSIS — Y9389 Activity, other specified: Secondary | ICD-10-CM | POA: Diagnosis not present

## 2020-12-15 DIAGNOSIS — Y990 Civilian activity done for income or pay: Secondary | ICD-10-CM | POA: Insufficient documentation

## 2020-12-15 DIAGNOSIS — J45909 Unspecified asthma, uncomplicated: Secondary | ICD-10-CM | POA: Insufficient documentation

## 2020-12-15 DIAGNOSIS — S63502A Unspecified sprain of left wrist, initial encounter: Secondary | ICD-10-CM

## 2020-12-15 DIAGNOSIS — Y9289 Other specified places as the place of occurrence of the external cause: Secondary | ICD-10-CM | POA: Diagnosis not present

## 2020-12-15 MED ORDER — IBUPROFEN 600 MG PO TABS: 600.0000 mg | ORAL_TABLET | Freq: Four times a day (QID) | ORAL | 0 refills | Status: DC | PRN

## 2020-12-15 NOTE — ED Triage Notes (Signed)
States she was working and lifting a heavy box above her head today, states her L wrist twisted a weird way and now it hurts to move it. Hurts even when not in use.

## 2020-12-15 NOTE — ED Notes (Signed)
Wrist splint applied  cms remains intact

## 2020-12-15 NOTE — ED Provider Notes (Signed)
[facility] DEPT Provider Note   CSN: [id number] Arrival date & time: 12/15/20  1428     History Chief Complaint  Patient presents with   Wrist Pain    [name] is a 35 y.o. female.  The history is provided by the patient. No language interpreter was used.  Wrist Pain   36 year old female significant history of bipolar, borderline personality disorder, obesity, presenting complaining of wrist pain.  Patient reports today while walking and carrying heavy boxes, one of the box slipped off her left hand and hyper extended her left wrist causing pain.  She endorsed sharp throbbing pain across her wrist worse with palpation with movement.  Pain is moderate in severity without any numbness and no hand pain or finger pain.  No specific treatment tried.  No elbow pain or shoulder pain.  Past Medical History:  Diagnosis Date   Allergy    Anxiety    Asthma    Bipolar 2 disorder ([place])    Borderline personality disorder ([place])    Depression    Eczema    Former smoker    Obesity    PTSD (post-traumatic stress disorder)    Urticaria     Patient Active Problem List   Diagnosis Date Noted   Vitamin D deficiency 07/15/2020   B12 deficiency 07/15/2020   MDD (major depressive disorder), recurrent severe, without psychosis ([place]) 04/26/2020   PTSD (post-traumatic stress disorder) 01/03/2020   Allergic asthma 01/13/2018   Allergic rhinitis 03/12/2017   Class 2 obesity due to excess calories without serious comorbidity with body mass index (BMI) of 37.0 to 37.9 in adult 04/17/2016   Asthma 03/13/2016   Borderline personality disorder ([place]) 03/13/2016   Mood disorder ([place]) 03/04/2013    Past Surgical History:  Procedure Laterality Date   Thumb surgery Left      OB History   No obstetric history on file.     Family History  Problem Relation Age of Onset   Healthy Mother    Healthy Father    Alzheimer's disease Maternal Grandmother    Skin  cancer Paternal Grandmother    Allergic rhinitis Paternal Grandmother    Asthma Paternal Grandmother    Angioedema Neg Hx    Eczema Neg Hx    Urticaria Neg Hx     Social History   Tobacco Use   Smoking status: Former    Packs/day: 0.50    Years: 9.00    Pack years: 4.50    Types: Cigarettes    Quit date: 12/04/2015    Years since quitting: 5.0   Smokeless tobacco: Never  Vaping Use   Vaping Use: Former  Substance Use Topics   Alcohol use: Yes    Alcohol/week: 8.0 standard drinks    Types: 8 Cans of beer per week    Comment: occ   Drug use: Yes    Frequency: 4.0 times per week    Types: Marijuana    Comment: Last smoked this morning-03/09/19    Home Medications Prior to Admission medications   Medication Sig Start Date End Date Taking? Authorizing Provider  albuterol (PROVENTIL) (2.5 MG/3ML) 0.083% nebulizer solution Take 3 mLs (2.5 mg total) by nebulization every 6 (six) hours as needed for wheezing or shortness of breath. 05/10/20   Valentina Shaggy, MD  albuterol (VENTOLIN HFA) 108 (90 Base) MCG/ACT inhaler USE 2 PUFFS EVERY 6 HOURS AS NEEDED FOR WHEEZING OR SHORTNESS OF BREATH 05/10/20   Valentina Shaggy, MD  albuterol (VENTOLIN HFA) 108 (90 Base) MCG/ACT inhaler Inhale 2 puffs into the lungs every 6 (six) hours as needed for wheezing or shortness of breath. 05/15/20   Valentina Shaggy, MD  ALPRAZolam Duanne Moron) 1 MG tablet Take 1 tablet (1 mg total) by mouth 2 (two) times daily as needed for anxiety. 01/03/20   Biagio Borg, MD  budesonide-formoterol [facility]) 160-4.5 MCG/ACT inhaler Inhale 2 puffs into the lungs 2 (two) times daily. 05/10/20   Valentina Shaggy, MD  diphenoxylate-atropine (LOMOTIL) 2.5-0.025 MG tablet Take 1 tablet by mouth 4 (four) times daily as needed for diarrhea or loose stools. Patient not taking: No sig reported 01/03/20   Biagio Borg, MD  fluconazole (DIFLUCAN) 150 MG tablet 1 tab by mouth every 3 days as needed Patient not taking:  Reported on 10/23/2020 07/10/20   Biagio Borg, MD  FLUoxetine (PROZAC) 40 MG capsule Take 1 capsule (40 mg total) by mouth daily. Patient not taking: Reported on 10/23/2020 10/15/16   Regalado, Jerald Kief A, MD  fluticasone (FLONASE) 50 MCG/ACT nasal spray Place 2 sprays into both nostrils daily. Patient taking differently: Place 2 sprays into both nostrils at bedtime. 05/10/20   Valentina Shaggy, MD  gabapentin (NEURONTIN) 300 MG capsule Take 1 capsule (300 mg total) by mouth 3 (three) times daily. Patient not taking: Reported on 10/23/2020 05/01/14   Wendie Agreste, MD  gabapentin (NEURONTIN) 600 MG tablet Take 600 mg by mouth 3 (three) times daily. 05/15/20   [provider]  hydrocortisone (ANUSOL-HC) 25 MG suppository Place 1 suppository (25 mg total) rectally 2 (two) times daily. 07/10/20   Biagio Borg, MD  hydrOXYzine (ATARAX/VISTARIL) 50 MG tablet Take 1 tablet (50 mg total) by mouth in the morning and at bedtime. 1 tablet in the morning. 2 tablets at night 09/07/19   Valentina Shaggy, MD  levonorgestrel-ethinyl estradiol (NORDETTE) 0.15-30 MG-MCG tablet 0.15 mg.    [provider]  montelukast (SINGULAIR) 10 MG tablet Take 1 tablet (10 mg total) by mouth at bedtime. 05/10/20   Valentina Shaggy, MD  mupirocin cream (BACTROBAN) 2 % Apply 1 application topically 2 (two) times daily. 10/23/20   Horald Pollen, MD  nystatin (MYCOSTATIN/NYSTOP) powder APPLY TO THE AFFECTED AREA TWICE DAILY Patient not taking: Reported on 10/23/2020 06/02/19   Biagio Borg, MD  nystatin-triamcinolone ointment (MYCOLOG) APPLY TO AFFECTED AREA TWICE DAILY. Patient taking differently: Apply 1 application topically 2 (two) times daily. APPLY TO AFFECTED AREA TWICE DAILY. 06/28/20   Biagio Borg, MD  OLANZapine (ZYPREXA) 10 MG tablet Take 10 mg by mouth at bedtime.    [provider]  omeprazole (PRILOSEC) 20 MG capsule Take 1 capsule (20 mg total) by mouth daily. 10/17/20   Prosperi,  Christian H, PA-C  potassium chloride SA (KLOR-CON) 20 MEQ tablet Take 20 mEq by mouth daily. Patient not taking: Reported on 10/23/2020 06/09/20   [provider]  prazosin (MINIPRESS) 1 MG capsule Take 1-3 mg by mouth See admin instructions. Take 1 tablet in the morning and 3 tablets before bedtime. 05/02/20   [provider]  traZODone (DESYREL) 100 MG tablet Take 100 mg by mouth at bedtime as needed for sleep. 05/02/20   [provider]  triamcinolone cream (KENALOG) 0.1 % Apply 1 application topically 2 (two) times daily. Caution applying to breast tissue -- limit to one week total and apply only to areas with rash. Limit use to 2-3 weeks elsewhere on body.  10/17/20   Prosperi, Christian H, PA-C  vitamin B-12 (CYANOCOBALAMIN) 1000 MCG tablet Take 1 tablet (1,000 mcg total) by mouth daily. Patient not taking: No sig reported 06/23/19   Biagio Borg, MD    Allergies    Benztropine and Other  Review of Systems   Review of Systems  Constitutional:  Negative for fever.  Musculoskeletal:  Positive for arthralgias.  Skin:  Negative for wound.  Neurological:  Negative for numbness.   Physical Exam Updated Vital Signs BP 116/81 (BP Location: Left Arm)   Pulse 99   Temp (!) 97.4 F (36.3 C) (Oral)   Resp 18   Ht 5\' 2"  (1.575 m)   Wt 99.8 kg   LMP 11/20/2020 (Approximate)   SpO2 100%   BMI 40.24 kg/m   Physical Exam Vitals and nursing note reviewed.  Constitutional:      General: She is not in acute distress.    Appearance: She is well-developed.  HENT:     Head: Atraumatic.  Eyes:     Conjunctiva/sclera: Conjunctivae normal.  Pulmonary:     Effort: Pulmonary effort is normal.  Musculoskeletal:        General: Tenderness (Left wrist: Tenderness across the wrist on palpation without deformity.  Normal wrist flexion extension supination pronation, radial pulse 2+, normal grip strength) present.     Cervical back: Neck supple.  Skin:    Findings: No rash.   Neurological:     Mental Status: She is alert.  Psychiatric:        Mood and Affect: Mood normal.    ED Results / Procedures / Treatments   Labs (all labs ordered are listed, but only abnormal results are displayed) Labs Reviewed - No data to display  EKG None  Radiology DG Wrist Complete Left  Result Date: 12/15/2020 CLINICAL DATA:  Lifting injury, now with generalized left wrist pain. EXAM: LEFT WRIST - COMPLETE 3+ VIEW COMPARISON:  None. FINDINGS: No fracture or dislocation. Note is made of a bone island involving the distal aspect of the radius. No associated periostitis. Joint spaces are preserved. No erosions. No evidence of chondrocalcinosis. Regional soft tissues appear normal. IMPRESSION: No explanation for patient's generalized left wrist pain. Electronically Signed   By: Sandi Mariscal M.D.   On: 12/15/2020 15:35    Procedures Procedures   Medications Ordered in ED Medications - No data to display  ED Course  I have reviewed the triage vital signs and the nursing notes.  Pertinent labs & imaging results that were available during my care of the patient were reviewed by me and considered in my medical decision making (see chart for details).    MDM Rules/Calculators/A&P                           BP 116/81 (BP Location: Left Arm)   Pulse 99   Temp (!) 97.4 F (36.3 C) (Oral)   Resp 18   Ht 5\' 2"  (1.575 m)   Wt 99.8 kg   LMP 11/20/2020 (Approximate)   SpO2 100%   BMI 40.24 kg/m   Final Clinical Impression(s) / ED Diagnoses Final diagnoses:  Sprain of left wrist, initial encounter    Rx / DC Orders ED Discharge Orders     None      3:53 PM Patient here with left wrist injury, likely a sprain, x-ray negative.  RICE therapy discussed, Velcro wrist brace provided.   Domenic Moras, PA-C 12/15/20  [place], Gwenyth Allegra, MD 12/15/20 1944

## 2020-12-18 ENCOUNTER — Emergency Department (HOSPITAL_COMMUNITY)
Admission: EM | Admit: 2020-12-18 | Discharge: 2020-12-19 | Disposition: A | Discharge status: Other Acute Inpatient Hospital | Payer: 59 | Source: Home / Self Care | Attending: Emergency Medicine | Admitting: Emergency Medicine

## 2020-12-18 ENCOUNTER — Encounter (HOSPITAL_COMMUNITY): Payer: Self-pay | Admitting: Emergency Medicine

## 2020-12-18 DIAGNOSIS — F1020 Alcohol dependence, uncomplicated: Secondary | ICD-10-CM | POA: Insufficient documentation

## 2020-12-18 DIAGNOSIS — Z87891 Personal history of nicotine dependence: Secondary | ICD-10-CM | POA: Insufficient documentation

## 2020-12-18 DIAGNOSIS — F332 Major depressive disorder, recurrent severe without psychotic features: Secondary | ICD-10-CM | POA: Diagnosis not present

## 2020-12-18 DIAGNOSIS — T43592A Poisoning by other antipsychotics and neuroleptics, intentional self-harm, initial encounter: Secondary | ICD-10-CM | POA: Insufficient documentation

## 2020-12-18 DIAGNOSIS — J45909 Unspecified asthma, uncomplicated: Secondary | ICD-10-CM | POA: Insufficient documentation

## 2020-12-18 DIAGNOSIS — F1220 Cannabis dependence, uncomplicated: Secondary | ICD-10-CM | POA: Insufficient documentation

## 2020-12-18 DIAGNOSIS — Z7951 Long term (current) use of inhaled steroids: Secondary | ICD-10-CM | POA: Insufficient documentation

## 2020-12-18 DIAGNOSIS — Z20822 Contact with and (suspected) exposure to covid-19: Secondary | ICD-10-CM | POA: Insufficient documentation

## 2020-12-18 DIAGNOSIS — Y906 Blood alcohol level of 120-199 mg/100 ml: Secondary | ICD-10-CM | POA: Insufficient documentation

## 2020-12-18 DIAGNOSIS — T50902A Poisoning by unspecified drugs, medicaments and biological substances, intentional self-harm, initial encounter: Secondary | ICD-10-CM

## 2020-12-18 LAB — COMPREHENSIVE METABOLIC PANEL
ALT: 12 U/L (ref 0–44)
AST: 18 U/L (ref 15–41)
Albumin: 3.5 g/dL (ref 3.5–5.0)
Alkaline Phosphatase: 64 U/L (ref 38–126)
Anion gap: 7 (ref 5–15)
BUN: 10 mg/dL (ref 6–20)
CO2: 20 mmol/L — ABNORMAL LOW (ref 22–32)
Calcium: 8.6 mg/dL — ABNORMAL LOW (ref 8.9–10.3)
Chloride: 114 mmol/L — ABNORMAL HIGH (ref 98–111)
Creatinine, Ser: 0.71 mg/dL (ref 0.44–1.00)
GFR, Estimated: >60 mL/min (ref >60)
Glucose, Bld: 106 mg/dL — ABNORMAL HIGH (ref 70–99)
Potassium: 3.6 mmol/L (ref 3.5–5.1)
Sodium: 141 mmol/L (ref 135–145)
Total Bilirubin: 0.3 mg/dL (ref 0.3–1.2)
Total Protein: 6.6 g/dL (ref 6.5–8.1)

## 2020-12-18 LAB — CBC
HCT: 38.1 % (ref 36.0–46.0)
Hemoglobin: 12 g/dL (ref 12.0–15.0)
MCH: 26.4 pg (ref 26.0–34.0)
MCHC: 31.5 g/dL (ref 30.0–36.0)
MCV: 83.9 fL (ref 80.0–100.0)
Platelets: 299 10*3/uL (ref 150–400)
RBC: 4.54 MIL/uL (ref 3.87–5.11)
RDW: 15 % (ref 11.5–15.5)
WBC: 8.8 10*3/uL (ref 4.0–10.5)
nRBC: 0 % (ref 0.0–0.2)

## 2020-12-18 LAB — RESP PANEL BY RT-PCR (FLU A&B, COVID) ARPGX2
Influenza A by PCR: NEGATIVE
Influenza B by PCR: NEGATIVE
SARS Coronavirus 2 by RT PCR: NEGATIVE

## 2020-12-18 LAB — I-STAT BETA HCG BLOOD, ED (MC, WL, AP ONLY): I-stat hCG, quantitative: <5 m[IU]/mL (ref <5)

## 2020-12-18 LAB — RAPID URINE DRUG SCREEN, HOSP PERFORMED
Amphetamines: NOT DETECTED
Barbiturates: NOT DETECTED
Benzodiazepines: NOT DETECTED
Cocaine: NOT DETECTED
Opiates: NOT DETECTED
Tetrahydrocannabinol: POSITIVE — AB

## 2020-12-18 LAB — ETHANOL: Alcohol, Ethyl (B): 176 mg/dL — ABNORMAL HIGH (ref <10)

## 2020-12-18 LAB — SALICYLATE LEVEL: Salicylate Lvl: <7 mg/dL — ABNORMAL LOW (ref 7.0–30.0)

## 2020-12-18 LAB — ACETAMINOPHEN LEVEL: Acetaminophen (Tylenol), Serum: <10 ug/mL — ABNORMAL LOW (ref 10–30)

## 2020-12-18 MED ORDER — SODIUM CHLORIDE 0.9 % IV BOLUS
1000.0000 mL | Freq: Once | INTRAVENOUS | Status: AC
  Administered 2020-12-18: 1000 mL via INTRAVENOUS

## 2020-12-18 NOTE — ED Triage Notes (Addendum)
EMS reports patient took 18 pills (zypexa 2.5mg  each) with alcohol around 7pm. Patient states she wanted it to be over.

## 2020-12-18 NOTE — ED Notes (Signed)
Pt belonging have been collected and placed in the cabinet room 16-18 and recess A. Pt has 3 bags her personal bag that is black and gray with her personal items such as laptop, 2 phones etc, as well as two pt belonging bags with her clothes and her portable phone charger. Pt has been dressed out into burgundy scrubs. Pt has facial piercing and nipple piercing that can not be removed. PLEASE make sure pt receives ALL three bags when she is discharged due to her having important personal items in the bags!

## 2020-12-18 NOTE — ED Provider Notes (Signed)
[facility] DEPT Provider Note   CSN: [id number] Arrival date & time: 12/18/20  2039     History Chief Complaint  Patient presents with   Ingestion    [name] is a 35 y.o. female history of bipolar, depression, PTSD, here presenting with suicidal attempt.  Patient states that she wants to kill herself and texted her friends and took 18 pills of her Zyprexa (2.5 mg each) and drink alcohol around 7 PM.  She states that she just was under a lot of stress and does not want to live anymore  The history is provided by the patient.      Past Medical History:  Diagnosis Date   Allergy    Anxiety    Asthma    Bipolar 2 disorder ([place])    Borderline personality disorder ([place])    Depression    Eczema    Former smoker    Obesity    PTSD (post-traumatic stress disorder)    Urticaria     Patient Active Problem List   Diagnosis Date Noted   Vitamin D deficiency 07/15/2020   B12 deficiency 07/15/2020   MDD (major depressive disorder), recurrent severe, without psychosis ([place]) 04/26/2020   PTSD (post-traumatic stress disorder) 01/03/2020   Allergic asthma 01/13/2018   Allergic rhinitis 03/12/2017   Class 2 obesity due to excess calories without serious comorbidity with body mass index (BMI) of 37.0 to 37.9 in adult 04/17/2016   Asthma 03/13/2016   Borderline personality disorder ([place]) 03/13/2016   Mood disorder ([place]) 03/04/2013    Past Surgical History:  Procedure Laterality Date   Thumb surgery Left      OB History   No obstetric history on file.     Family History  Problem Relation Age of Onset   Healthy Mother    Healthy Father    Alzheimer's disease Maternal Grandmother    Skin cancer Paternal Grandmother    Allergic rhinitis Paternal Grandmother    Asthma Paternal Grandmother    Angioedema Neg Hx    Eczema Neg Hx    Urticaria Neg Hx     Social History   Tobacco Use   Smoking status: Former    Packs/day: 0.50     Years: 9.00    Pack years: 4.50    Types: Cigarettes    Quit date: 12/04/2015    Years since quitting: 5.0   Smokeless tobacco: Never  Vaping Use   Vaping Use: Former  Substance Use Topics   Alcohol use: Yes    Alcohol/week: 8.0 standard drinks    Types: 8 Cans of beer per week    Comment: occ   Drug use: Yes    Frequency: 4.0 times per week    Types: Marijuana    Comment: Last smoked this morning-03/09/19    Home Medications Prior to Admission medications   Medication Sig Start Date End Date Taking? Authorizing Provider  albuterol (PROVENTIL) (2.5 MG/3ML) 0.083% nebulizer solution Take 3 mLs (2.5 mg total) by nebulization every 6 (six) hours as needed for wheezing or shortness of breath. 05/10/20   Valentina Shaggy, MD  albuterol (VENTOLIN HFA) 108 (90 Base) MCG/ACT inhaler USE 2 PUFFS EVERY 6 HOURS AS NEEDED FOR WHEEZING OR SHORTNESS OF BREATH 05/10/20   Valentina Shaggy, MD  albuterol (VENTOLIN HFA) 108 (90 Base) MCG/ACT inhaler Inhale 2 puffs into the lungs every 6 (six) hours as needed for wheezing or shortness of breath. 05/15/20   Valentina Shaggy,  MD  ALPRAZolam (XANAX) 1 MG tablet Take 1 tablet (1 mg total) by mouth 2 (two) times daily as needed for anxiety. 01/03/20   Biagio Borg, MD  budesonide-formoterol [facility]) 160-4.5 MCG/ACT inhaler Inhale 2 puffs into the lungs 2 (two) times daily. 05/10/20   Valentina Shaggy, MD  diphenoxylate-atropine (LOMOTIL) 2.5-0.025 MG tablet Take 1 tablet by mouth 4 (four) times daily as needed for diarrhea or loose stools. Patient not taking: No sig reported 01/03/20   Biagio Borg, MD  fluconazole (DIFLUCAN) 150 MG tablet 1 tab by mouth every 3 days as needed Patient not taking: Reported on 10/23/2020 07/10/20   Biagio Borg, MD  FLUoxetine (PROZAC) 40 MG capsule Take 1 capsule (40 mg total) by mouth daily. Patient not taking: Reported on 10/23/2020 10/15/16   Regalado, Jerald Kief A, MD  fluticasone (FLONASE) 50 MCG/ACT nasal spray  Place 2 sprays into both nostrils daily. Patient taking differently: Place 2 sprays into both nostrils at bedtime. 05/10/20   Valentina Shaggy, MD  gabapentin (NEURONTIN) 300 MG capsule Take 1 capsule (300 mg total) by mouth 3 (three) times daily. Patient not taking: Reported on 10/23/2020 05/01/14   Wendie Agreste, MD  gabapentin (NEURONTIN) 600 MG tablet Take 600 mg by mouth 3 (three) times daily. 05/15/20   [provider]  hydrocortisone (ANUSOL-HC) 25 MG suppository Place 1 suppository (25 mg total) rectally 2 (two) times daily. 07/10/20   Biagio Borg, MD  hydrOXYzine (ATARAX/VISTARIL) 50 MG tablet Take 1 tablet (50 mg total) by mouth in the morning and at bedtime. 1 tablet in the morning. 2 tablets at night 09/07/19   Valentina Shaggy, MD  ibuprofen (ADVIL) 600 MG tablet Take 1 tablet (600 mg total) by mouth every 6 (six) hours as needed for moderate pain. 12/15/20   Domenic Moras, PA-C  levonorgestrel-ethinyl estradiol (NORDETTE) 0.15-30 MG-MCG tablet 0.15 mg.    [provider]  montelukast (SINGULAIR) 10 MG tablet Take 1 tablet (10 mg total) by mouth at bedtime. 05/10/20   Valentina Shaggy, MD  mupirocin cream (BACTROBAN) 2 % Apply 1 application topically 2 (two) times daily. 10/23/20   Horald Pollen, MD  nystatin (MYCOSTATIN/NYSTOP) powder APPLY TO THE AFFECTED AREA TWICE DAILY Patient not taking: Reported on 10/23/2020 06/02/19   Biagio Borg, MD  nystatin-triamcinolone ointment (MYCOLOG) APPLY TO AFFECTED AREA TWICE DAILY. Patient taking differently: Apply 1 application topically 2 (two) times daily. APPLY TO AFFECTED AREA TWICE DAILY. 06/28/20   Biagio Borg, MD  OLANZapine (ZYPREXA) 10 MG tablet Take 10 mg by mouth at bedtime.    [provider]  omeprazole (PRILOSEC) 20 MG capsule Take 1 capsule (20 mg total) by mouth daily. 10/17/20   Prosperi, Christian H, PA-C  potassium chloride SA (KLOR-CON) 20 MEQ tablet Take 20 mEq by mouth daily. Patient  not taking: Reported on 10/23/2020 06/09/20   [provider]  prazosin (MINIPRESS) 1 MG capsule Take 1-3 mg by mouth See admin instructions. Take 1 tablet in the morning and 3 tablets before bedtime. 05/02/20   [provider]  traZODone (DESYREL) 100 MG tablet Take 100 mg by mouth at bedtime as needed for sleep. 05/02/20   [provider]  triamcinolone cream (KENALOG) 0.1 % Apply 1 application topically 2 (two) times daily. Caution applying to breast tissue -- limit to one week total and apply only to areas with rash. Limit use to 2-3 weeks elsewhere on body. 10/17/20  Prosperi, Christian H, PA-C  vitamin B-12 (CYANOCOBALAMIN) 1000 MCG tablet Take 1 tablet (1,000 mcg total) by mouth daily. Patient not taking: No sig reported 06/23/19   Biagio Borg, MD    Allergies    Benztropine, Other, and Lamictal [lamotrigine]  Review of Systems   Review of Systems  Psychiatric/Behavioral:  Positive for suicidal ideas.   All other systems reviewed and are negative.  Physical Exam Updated Vital Signs BP 119/70   Pulse 100   Temp 97.6 F (36.4 C) (Oral)   Resp 16   Ht 5\' 2"  (1.575 m)   Wt 103 kg   LMP 11/20/2020 (Approximate)   SpO2 93%   BMI 41.52 kg/m   Physical Exam Vitals and nursing note reviewed.  Constitutional:      Comments: Depressed  HENT:     Head: Normocephalic.     Nose: Nose normal.     Mouth/Throat:     Mouth: Mucous membranes are moist.  Eyes:     Extraocular Movements: Extraocular movements intact.     Pupils: Pupils are equal, round, and reactive to light.  Cardiovascular:     Rate and Rhythm: Normal rate and regular rhythm.     Pulses: Normal pulses.     Heart sounds: Normal heart sounds.  Pulmonary:     Effort: Pulmonary effort is normal.     Breath sounds: Normal breath sounds.  Abdominal:     General: Abdomen is flat.     Palpations: Abdomen is soft.  Musculoskeletal:        General: Normal range of motion.     Cervical back:  Normal range of motion and neck supple.  Skin:    General: Skin is warm.     Capillary Refill: Capillary refill takes less than 2 seconds.  Neurological:     General: No focal deficit present.     Mental Status: She is alert and oriented to person, place, and time.  Psychiatric:     Comments: Depressed     ED Results / Procedures / Treatments   Labs (all labs ordered are listed, but only abnormal results are displayed) Labs Reviewed  COMPREHENSIVE METABOLIC PANEL - Abnormal; Notable for the following components:      Result Value   Chloride 114 (*)    CO2 20 (*)    Glucose, Bld 106 (*)    Calcium 8.6 (*)    All other components within normal limits  ETHANOL - Abnormal; Notable for the following components:   Alcohol, Ethyl (B) 176 (*)    All other components within normal limits  SALICYLATE LEVEL - Abnormal; Notable for the following components:   Salicylate Lvl <0.9 (*)    All other components within normal limits  ACETAMINOPHEN LEVEL - Abnormal; Notable for the following components:   Acetaminophen (Tylenol), Serum <10 (*)    All other components within normal limits  RAPID URINE DRUG SCREEN, HOSP PERFORMED - Abnormal; Notable for the following components:   Tetrahydrocannabinol POSITIVE (*)    All other components within normal limits  RESP PANEL BY RT-PCR (FLU A&B, COVID) ARPGX2  CBC  ACETAMINOPHEN LEVEL  I-STAT BETA HCG BLOOD, ED ([facility], [facility], [facility] ONLY)    EKG EKG Interpretation  Date/Time:  Wednesday December 18 2020 21:22:04 EST Ventricular Rate:  91 PR Interval:  135 QRS Duration: 80 QT Interval:  364 QTC Calculation: 448 R Axis:   66 Text Interpretation: Sinus rhythm No significant change since last tracing Confirmed by Darl Householder,  Fenton Malling ([id number]) on 12/18/2020 9:44:56 PM  Radiology No results found.  Procedures Procedures   Medications Ordered in ED Medications  sodium chloride 0.9 % bolus 1,000 mL (0 mLs Intravenous Stopped 12/18/20 2307)    ED Course  I  have reviewed the triage vital signs and the nursing notes.  Pertinent labs & imaging results that were available during my care of the patient were reviewed by me and considered in my medical decision making (see chart for details).    MDM Rules/Calculators/A&P                           ELSYE MCCOLLISTER is a 35 y.o. female here presenting with suicidal attempt.  She took 45 mg of zyprexa and drink alcohol.  I discussed case with poison control.  They recommend 6-hour observation and observe for CNS depression and seizures.  They also recommend a 4-hour Tylenol level. Patient does not have a prolonged QTC.  Plan to get CBC CMP and 4-hour Tylenol level and UDS.   11:21 PM ETOH is 176.  CMP showed bicarb of 20 but normal anion gap.  CBC unremarkable.  UDS negative.  Signed out to Dr. Stark Jock pending 4-hour Tylenol level and reassessment at 2 am. If she is doing well then she can be medically cleared at that time. TTS consult requested.    Final Clinical Impression(s) / ED Diagnoses Final diagnoses:  None    Rx / DC Orders ED Discharge Orders     None        Drenda Freeze, MD 12/18/20 2321

## 2020-12-19 ENCOUNTER — Inpatient Hospital Stay (HOSPITAL_COMMUNITY)
Admission: AD | Admit: 2020-12-19 | Discharge: 2020-12-24 | DRG: 918 | Disposition: A | Discharge status: Home / Self Care | Payer: 59 | Source: Intra-hospital | Attending: Emergency Medicine | Admitting: Emergency Medicine

## 2020-12-19 ENCOUNTER — Encounter (HOSPITAL_COMMUNITY): Payer: Self-pay | Admitting: Family

## 2020-12-19 ENCOUNTER — Other Ambulatory Visit: Payer: Self-pay

## 2020-12-19 DIAGNOSIS — Y906 Blood alcohol level of 120-199 mg/100 ml: Secondary | ICD-10-CM | POA: Diagnosis present

## 2020-12-19 DIAGNOSIS — F410 Panic disorder [episodic paroxysmal anxiety] without agoraphobia: Secondary | ICD-10-CM | POA: Diagnosis present

## 2020-12-19 DIAGNOSIS — Z6841 Body Mass Index (BMI) 40.0 and over, adult: Secondary | ICD-10-CM

## 2020-12-19 DIAGNOSIS — Z9152 Personal history of nonsuicidal self-harm: Secondary | ICD-10-CM | POA: Diagnosis not present

## 2020-12-19 DIAGNOSIS — Z79899 Other long term (current) drug therapy: Secondary | ICD-10-CM | POA: Diagnosis not present

## 2020-12-19 DIAGNOSIS — Z888 Allergy status to other drugs, medicaments and biological substances status: Secondary | ICD-10-CM | POA: Diagnosis not present

## 2020-12-19 DIAGNOSIS — Y929 Unspecified place or not applicable: Secondary | ICD-10-CM

## 2020-12-19 DIAGNOSIS — J45909 Unspecified asthma, uncomplicated: Secondary | ICD-10-CM | POA: Diagnosis present

## 2020-12-19 DIAGNOSIS — F10239 Alcohol dependence with withdrawal, unspecified: Secondary | ICD-10-CM | POA: Diagnosis present

## 2020-12-19 DIAGNOSIS — F603 Borderline personality disorder: Secondary | ICD-10-CM | POA: Diagnosis present

## 2020-12-19 DIAGNOSIS — F3181 Bipolar II disorder: Secondary | ICD-10-CM | POA: Diagnosis present

## 2020-12-19 DIAGNOSIS — Z5900 Homelessness unspecified: Secondary | ICD-10-CM | POA: Diagnosis not present

## 2020-12-19 DIAGNOSIS — F411 Generalized anxiety disorder: Secondary | ICD-10-CM | POA: Diagnosis present

## 2020-12-19 DIAGNOSIS — R4587 Impulsiveness: Secondary | ICD-10-CM | POA: Diagnosis present

## 2020-12-19 DIAGNOSIS — F1290 Cannabis use, unspecified, uncomplicated: Secondary | ICD-10-CM | POA: Diagnosis present

## 2020-12-19 DIAGNOSIS — Z820 Family history of epilepsy and other diseases of the nervous system: Secondary | ICD-10-CM

## 2020-12-19 DIAGNOSIS — L739 Follicular disorder, unspecified: Secondary | ICD-10-CM | POA: Diagnosis present

## 2020-12-19 DIAGNOSIS — F4310 Post-traumatic stress disorder, unspecified: Secondary | ICD-10-CM | POA: Diagnosis present

## 2020-12-19 DIAGNOSIS — T43592A Poisoning by other antipsychotics and neuroleptics, intentional self-harm, initial encounter: Principal | ICD-10-CM | POA: Diagnosis present

## 2020-12-19 DIAGNOSIS — E669 Obesity, unspecified: Secondary | ICD-10-CM | POA: Diagnosis present

## 2020-12-19 DIAGNOSIS — L731 Pseudofolliculitis barbae: Secondary | ICD-10-CM | POA: Diagnosis present

## 2020-12-19 DIAGNOSIS — Z20822 Contact with and (suspected) exposure to covid-19: Secondary | ICD-10-CM | POA: Diagnosis present

## 2020-12-19 DIAGNOSIS — F332 Major depressive disorder, recurrent severe without psychotic features: Secondary | ICD-10-CM | POA: Diagnosis present

## 2020-12-19 LAB — ACETAMINOPHEN LEVEL: Acetaminophen (Tylenol), Serum: <10 ug/mL — ABNORMAL LOW (ref 10–30)

## 2020-12-19 MED ORDER — PRAZOSIN HCL 1 MG PO CAPS
1.0000 mg | ORAL_CAPSULE | Freq: Every day | ORAL | Status: DC
  Administered 2020-12-20 – 2020-12-24 (×5): 1 mg via ORAL
  Filled 2020-12-19 (×6): qty 1

## 2020-12-19 MED ORDER — MONTELUKAST SODIUM 10 MG PO TABS
10.0000 mg | ORAL_TABLET | Freq: Every day | ORAL | Status: DC
  Administered 2020-12-19 – 2020-12-23 (×5): 10 mg via ORAL
  Filled 2020-12-19 (×7): qty 1

## 2020-12-19 MED ORDER — LEVONORGESTREL-ETHINYL ESTRAD 0.15-30 MG-MCG PO TABS: 1.0000 | ORAL_TABLET | Freq: Every day | ORAL | Status: DC

## 2020-12-19 MED ORDER — HYDROXYZINE HCL 50 MG PO TABS
50.0000 mg | ORAL_TABLET | Freq: Three times a day (TID) | ORAL | Status: DC | PRN
  Administered 2020-12-20 – 2020-12-24 (×6): 50 mg via ORAL
  Filled 2020-12-19 (×8): qty 1

## 2020-12-19 MED ORDER — BISMUTH SUBSALICYLATE 262 MG/15ML PO SUSP
30.0000 mL | Freq: Four times a day (QID) | ORAL | Status: DC | PRN
  Filled 2020-12-19: qty 118

## 2020-12-19 MED ORDER — PANTOPRAZOLE SODIUM 40 MG PO TBEC
40.0000 mg | DELAYED_RELEASE_TABLET | Freq: Every day | ORAL | Status: DC
  Administered 2020-12-20 – 2020-12-24 (×5): 40 mg via ORAL
  Filled 2020-12-19 (×6): qty 1

## 2020-12-19 MED ORDER — PRAZOSIN HCL 1 MG PO CAPS
3.0000 mg | ORAL_CAPSULE | Freq: Every day | ORAL | Status: DC
  Administered 2020-12-19 – 2020-12-23 (×5): 3 mg via ORAL
  Filled 2020-12-19 (×8): qty 3

## 2020-12-19 MED ORDER — ALUM & MAG HYDROXIDE-SIMETH 200-200-20 MG/5ML PO SUSP
30.0000 mL | ORAL | Status: DC | PRN
  Filled 2020-12-19: qty 30

## 2020-12-19 MED ORDER — FLUTICASONE PROPIONATE 50 MCG/ACT NA SUSP
2.0000 | Freq: Every day | NASAL | Status: DC
  Administered 2020-12-20 – 2020-12-24 (×5): 2 via NASAL
  Filled 2020-12-19: qty 16

## 2020-12-19 MED ORDER — GABAPENTIN 300 MG PO CAPS
600.0000 mg | ORAL_CAPSULE | Freq: Three times a day (TID) | ORAL | Status: DC
  Administered 2020-12-20 – 2020-12-24 (×13): 600 mg via ORAL
  Filled 2020-12-19 (×18): qty 2

## 2020-12-19 MED ORDER — MOMETASONE FURO-FORMOTEROL FUM 200-5 MCG/ACT IN AERO
2.0000 | INHALATION_SPRAY | Freq: Two times a day (BID) | RESPIRATORY_TRACT | Status: DC
  Administered 2020-12-19 – 2020-12-24 (×10): 2 via RESPIRATORY_TRACT
  Filled 2020-12-19 (×2): qty 8.8

## 2020-12-19 MED ORDER — OLANZAPINE 10 MG PO TABS
10.0000 mg | ORAL_TABLET | Freq: Every day | ORAL | Status: DC
  Administered 2020-12-19: 22:00:00 10 mg via ORAL
  Filled 2020-12-19 (×4): qty 1

## 2020-12-19 MED ORDER — ACETAMINOPHEN 325 MG PO TABS: 650.0000 mg | ORAL_TABLET | Freq: Four times a day (QID) | ORAL | Status: DC | PRN

## 2020-12-19 NOTE — Group Note (Signed)
Date:  12/19/2020 Time:  4:57 PM  Group Topic/Focus:  Self Care:   The focus of this group is to help patients understand the importance of self-care in order to improve or restore emotional, physical, spiritual, interpersonal, and financial health.    Participation Level:  Active  Participation Quality:  Appropriate  Affect:  Appropriate  Cognitive:  Appropriate  Insight: Appropriate  Engagement in Group:  Engaged  Modes of Intervention:  Education  Additional Comments:  Patient educated on ways to bring her self-esteem up to a degree where she can accept her past and her poor self image.  Jerrye Beavers 12/19/2020, 4:57 PM

## 2020-12-19 NOTE — Tx Team (Signed)
Initial Treatment Plan 12/19/2020 7:24 PM [name] CBJ:[id number]    PATIENT STRESSORS: Financial difficulties   Health problems   Occupational concerns   Substance abuse     PATIENT STRENGTHS: Ability for insight  Average or above average intelligence  Capable of independent living  Communication skills  General fund of knowledge  Religious Affiliation  Supportive family/friends  Work skills    PATIENT IDENTIFIED PROBLEMS:   "I want help with my low self-esteem and suicidal thoughts"                   DISCHARGE CRITERIA:  Ability to meet basic life and health needs Adequate post-discharge living arrangements Improved stabilization in mood, thinking, and/or behavior Motivation to continue treatment in a less acute level of care Need for constant or close observation no longer present Reduction of life-threatening or endangering symptoms to within safe limits Safe-care adequate arrangements made Verbal commitment to aftercare and medication compliance  PRELIMINARY DISCHARGE PLAN: Outpatient therapy Return to previous living arrangement Return to previous work or school arrangements  PATIENT/FAMILY INVOLVEMENT: This treatment plan has been presented to and reviewed with the patient, [name].  The patient and family have been given the opportunity to ask questions and make suggestions.  Dannielle Burn, RN 12/19/2020, 7:24 PM

## 2020-12-19 NOTE — Group Note (Signed)
Date:  12/19/2020 Time:  3:46 PM  Group Topic/Focus:  Emotional Education:   The focus of this group is to discuss what feelings/emotions are, and how they are experienced.    Participation Level:  None  Participation Quality:    Affect:    Cognitive:    Insight:   Engagement in Group:    Modes of Intervention:    Additional Comments:  Patient came into group at the end.   Jerrye Beavers 12/19/2020, 3:46 PM
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
## 2020-12-19 NOTE — ED Notes (Signed)
With TTS

## 2020-12-19 NOTE — ED Notes (Signed)
Patient is resting in bed.

## 2020-12-19 NOTE — BH Assessment (Signed)
Clinician reviewed pt's chart at 2303 in preparation to complete her [place] Assessment. However, at that time no EDP note was available, so her assessment could not be completed. Clinician reviewed pt's chart again at 0053 to determine if pt has been medically cleared; according to Dr. Darl Householder, pt's Tylenol level needs to be checked again at 0200 and at that time she might be able to be medically cleared. Due to pt not being medically cleared at this time, MH Assessment cannot be performed. TTS to attempt to complete assessment at a later time.
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
## 2020-12-19 NOTE — Progress Notes (Signed)
Pt accepted to [facility] 301-1    Patient meets inpatient criteria per Sheran Fava, FNP  The attending provider will be [place], MD  Call report to [phone]  Derrill Kay, RN @ [facility] ED notified.     Pt scheduled  to arrive at [place] at 1300.    Mariea Clonts, MSW, [name]-A  11:51 AM 12/19/2020

## 2020-12-19 NOTE — ED Provider Notes (Signed)
Pt has been accepted to [facility] by Dr. Mamie Levers.  Pt is stable for transfer.   Isla Pence, MD 12/19/20 [phone]

## 2020-12-19 NOTE — ED Notes (Signed)
Patient offered refreshments

## 2020-12-19 NOTE — ED Notes (Signed)
Patient is quiet and resting.

## 2020-12-19 NOTE — Progress Notes (Signed)
   12/19/20 2139  Psych Admission Type (Psych Patients Only)  Admission Status Voluntary  Psychosocial Assessment  Patient Complaints Anxiety;Depression  Eye Contact Fair;Glaring  Facial Expression Anxious;Animated  Affect Anxious;Appropriate to circumstance  Speech Logical/coherent  Interaction Attention-seeking;Needy;Manipulative  Motor Activity Fidgety  Appearance/Hygiene Disheveled  Behavior Characteristics Cooperative;Appropriate to situation  Mood Depressed;Anxious  Thought Process  Coherency WDL  Content WDL  Delusions None reported or observed  Perception WDL  Hallucination None reported or observed  Judgment Poor  Confusion None  Danger to Self  Current suicidal ideation? Passive  Self-Injurious Behavior No self-injurious ideation or behavior indicators observed or expressed   Agreement Not to Harm Self Yes  Description of Agreement Verbal contract  Danger to Others  Danger to Others None reported or observed

## 2020-12-19 NOTE — ED Notes (Signed)
Call received from pt boyfriend Loyola Mast [phone] requesting rtn call for pt status/updates. [employer]

## 2020-12-19 NOTE — [facility] Group Notes (Signed)
[place] [facility] Notes:  (Nursing/MHT/Case Management/Adjunct)  Date:  12/19/2020  Time:  8:28 PM  Type of Therapy:  Psychoeducational Skills  Participation Level:  Minimal  Participation Quality:  Attentive  Affect:  Flat  Cognitive:  Appropriate  Insight:  Improving  Engagement in Group:  Improving  Modes of Intervention:  Education  Summary of Progress/Problems: The patient's positive event for the day is that she is proud that she didn't die today and that she made some new friends. Her goal for tomorrow is that she wants her med's to be straightened out.   Archie Balboa S 12/19/2020, 8:28 PM

## 2020-12-19 NOTE — ED Notes (Signed)
Patient up to the bathroom.

## 2020-12-19 NOTE — ED Notes (Signed)
Poison control case is closed. 2nd EKG reported.

## 2020-12-19 NOTE — ED Notes (Addendum)
Gave report to [place] from [employer]. Recommended a 2nd EKG.
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
## 2020-12-20 ENCOUNTER — Encounter (HOSPITAL_COMMUNITY): Payer: Self-pay

## 2020-12-20 DIAGNOSIS — F3181 Bipolar II disorder: Secondary | ICD-10-CM | POA: Diagnosis present

## 2020-12-20 LAB — TSH: TSH: 4.865 u[IU]/mL — ABNORMAL HIGH (ref 0.350–4.500)

## 2020-12-20 LAB — HEMOGLOBIN A1C
Hgb A1c MFr Bld: 5.2 % (ref 4.8–5.6)
Mean Plasma Glucose: 102.54 mg/dL

## 2020-12-20 LAB — LIPID PANEL
Cholesterol: 202 mg/dL — ABNORMAL HIGH (ref 0–200)
HDL: 54 mg/dL (ref >40)
LDL Cholesterol: 127 mg/dL — ABNORMAL HIGH (ref 0–99)
Total CHOL/HDL Ratio: 3.7 RATIO
Triglycerides: 103 mg/dL (ref <150)
VLDL: 21 mg/dL (ref 0–40)

## 2020-12-20 LAB — T4, FREE: Free T4: 0.97 ng/dL (ref 0.61–1.12)

## 2020-12-20 MED ORDER — ONDANSETRON 4 MG PO TBDP: 4.0000 mg | ORAL_TABLET | Freq: Four times a day (QID) | ORAL | Status: AC | PRN

## 2020-12-20 MED ORDER — FOLIC ACID 1 MG PO TABS
1.0000 mg | ORAL_TABLET | Freq: Every day | ORAL | Status: DC
  Administered 2020-12-20 – 2020-12-24 (×5): 1 mg via ORAL
  Filled 2020-12-20 (×7): qty 1

## 2020-12-20 MED ORDER — HYDROXYZINE HCL 25 MG PO TABS: 25.0000 mg | ORAL_TABLET | Freq: Four times a day (QID) | ORAL | Status: DC | PRN

## 2020-12-20 MED ORDER — TRAZODONE HCL 50 MG PO TABS: 50.0000 mg | ORAL_TABLET | Freq: Every evening | ORAL | Status: DC | PRN

## 2020-12-20 MED ORDER — LOPERAMIDE HCL 2 MG PO CAPS: 2.0000 mg | ORAL_CAPSULE | ORAL | Status: AC | PRN

## 2020-12-20 MED ORDER — THIAMINE HCL 100 MG PO TABS
100.0000 mg | ORAL_TABLET | Freq: Every day | ORAL | Status: DC
  Administered 2020-12-21 – 2020-12-24 (×4): 100 mg via ORAL
  Filled 2020-12-20 (×5): qty 1

## 2020-12-20 MED ORDER — BISMUTH SUBSALICYLATE 262 MG PO CHEW: 524.0000 mg | CHEWABLE_TABLET | Freq: Four times a day (QID) | ORAL | Status: DC | PRN

## 2020-12-20 MED ORDER — THIAMINE HCL 100 MG/ML IJ SOLN: 100.0000 mg | Freq: Once | INTRAMUSCULAR | Status: DC

## 2020-12-20 MED ORDER — TOPIRAMATE 25 MG PO TABS
25.0000 mg | ORAL_TABLET | Freq: Two times a day (BID) | ORAL | Status: DC
  Administered 2020-12-20 – 2020-12-24 (×8): 25 mg via ORAL
  Filled 2020-12-20 (×11): qty 1

## 2020-12-20 MED ORDER — LORAZEPAM 1 MG PO TABS
1.0000 mg | ORAL_TABLET | Freq: Four times a day (QID) | ORAL | Status: AC | PRN
  Administered 2020-12-20 – 2020-12-22 (×3): 1 mg via ORAL
  Filled 2020-12-20 (×3): qty 1

## 2020-12-20 MED ORDER — LURASIDONE HCL 40 MG PO TABS
40.0000 mg | ORAL_TABLET | Freq: Every day | ORAL | Status: DC
  Administered 2020-12-20 – 2020-12-22 (×3): 40 mg via ORAL
  Filled 2020-12-20 (×6): qty 1

## 2020-12-20 MED ORDER — MELATONIN 5 MG PO TABS
5.0000 mg | ORAL_TABLET | Freq: Every day | ORAL | Status: DC
  Administered 2020-12-20 – 2020-12-23 (×4): 5 mg via ORAL
  Filled 2020-12-20 (×6): qty 1

## 2020-12-20 MED ORDER — ADULT MULTIVITAMIN W/MINERALS CH
1.0000 | ORAL_TABLET | Freq: Every day | ORAL | Status: DC
  Administered 2020-12-20 – 2020-12-24 (×5): 1 via ORAL
  Filled 2020-12-20 (×7): qty 1

## 2020-12-20 NOTE — Progress Notes (Signed)
Progress note  Pt found in bed; compliant with medication administration. Pt had complaints that their literature was taken on admission. Chaplain was consulted and felt the text was appropriate for the unit. Pt was anxious and agitated with pressured speech. Pt had multiple complaints throughout the day with the processes of how the unit schedule was laid out. Pt showed little impulse control and patience with the schedule of when the treatment team would be meeting with them. Pt had passive si but contracts for safety. Pt denies hi/ah/vh and verbally agrees to approach staff if these become apparent or before harming themselves/others while at [place].  A: Pt provided support and encouragement. Pt given medication per protocol and standing orders. Q50m safety checks implemented and continued.  R: Pt safe on the unit. Will continue to monitor.

## 2020-12-20 NOTE — Plan of Care (Signed)
  Problem: Education: Goal: Utilization of techniques to improve thought processes will improve Outcome: Progressing Goal: Knowledge of the prescribed therapeutic regimen will improve Outcome: Progressing   Problem: Activity: Goal: Interest or engagement in leisure activities will improve Outcome: Progressing   
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
## 2020-12-20 NOTE — BHH Counselor (Signed)
Events affecting the discharge plan:  -Pt was admitted 12/19/20  -CSW to complete Safety plan with room mate -appointment to be scheduled with a new medication management provider Interventions by CSW:  -CSW completed PSA  -CSW provided pt with shelter and food resources -CSW provided pt with information regarding the process for disability -CSW began [facility] referral Emotional response of the patient/family to the plan of care: -Pt completed PSA -Pt gave CSW consent for her therapist, room mate and boyfriend to complete safety planning -Pt request resources for settlers and disability and received these resources.   Toney Reil, [place] Worker [employer]

## 2020-12-20 NOTE — Progress Notes (Signed)
Adult Psychoeducational Group Note  Date:  12/20/2020 Time:  9:28 PM  Group Topic/Focus:  Wrap-Up Group:   The focus of this group is to help patients review their daily goal of treatment and discuss progress on daily workbooks.  Participation Level:  Active  Participation Quality:  Appropriate  Affect:  Appropriate  Cognitive:  Appropriate  Insight: Appropriate  Engagement in Group:  Engaged  Modes of Intervention:  Discussion  Additional Comments:  Patient said her day was 8. She is most hopeful . Her goal take her medications and feel better. Yes she achieved her goal. Her coping skills talking about her feelings. She want to know how much her medication when she gets out?  Lenice Llamas Long 12/20/2020, 9:28 PM

## 2020-12-20 NOTE — BHH Suicide Risk Assessment (Signed)
[facility] Admission Suicide Risk Assessment   Nursing information obtained from:  Patient, Review of record Demographic factors:  Caucasian Current Mental Status:  Suicidal ideation indicated by patient Loss Factors:  Decline in physical health, Financial problems / change in socioeconomic status Historical Factors:  Prior suicide attempts Risk Reduction Factors:  Sense of responsibility to family, Positive social support, Positive therapeutic relationship, Positive coping skills or problem solving skills  Total Time spent with patient: 30 minutes Principal Problem: Bipolar 2 disorder, major depressive episode ([place]) Diagnosis:  Principal Problem:   Bipolar 2 disorder, major depressive episode ([place]) Active Problems:   Borderline personality disorder ([place])   PTSD (post-traumatic stress disorder)   MDD (major depressive disorder), recurrent episode, severe ([place])  Subjective Data: [name] is a 35 year old female with past psychiatric history significant for PTSD, MDD, bipolar 2, borderline personality disorder, self-harm behaviors, multiple psychiatric admissions who is in the hospital because "I took an intentional overdose last Wednesday". She feels that she has been struggling with mental illness "as long as I can remember". She has had more than 10 hospitalizations starting in the 9th grade, and this is the 3rd one this year. She has unstable relationships and her moods are up and down. She likes the feeling of being up and elevated, but cannot tolerate the downs and depression. She has short and unstable relationships and poor self esteem. She has been tried on multiple medications and they sometimes work, but not for long.she endorses helplessness, hopelessness, loneliness, grief, worthlessness and tearfulness. She denies hallucinations. No current suicidal ideations, but has a history of intermittent suicidal ideations. History of self harm behaviors.   Continued Clinical Symptoms:   Alcohol Use Disorder Identification Test Final Score (AUDIT): 1 The "Alcohol Use Disorders Identification Test", Guidelines for Use in Primary Care, Second Edition.  World Pharmacologist [facility]). Score between 0-7:  no or low risk or alcohol related problems. Score between 8-15:  moderate risk of alcohol related problems. Score between 16-19:  high risk of alcohol related problems. Score 20 or above:  warrants further diagnostic evaluation for alcohol dependence and treatment.   CLINICAL FACTORS:   Bipolar Disorder:   Bipolar II Personality Disorders:   Cluster B More than one psychiatric diagnosis Previous Psychiatric Diagnoses and Treatments   Musculoskeletal: Strength & Muscle Tone: within normal limits Gait & Station: normal Patient leans: N/A  Psychiatric Specialty Exam:  Presentation  General Appearance: Appropriate for Environment; Casual (Multiple piercing in lips.)  Eye Contact:Fair  Speech:Normal Rate  Speech Volume:Normal  Handedness:Right   Mood and Affect  Mood:Anxious; Depressed; Hopeless  Affect:Labile; Depressed; Tearful; Congruent   Thought Process  Thought Processes:Coherent  Descriptions of Associations:Tangential  Orientation:Full (Time, Place and Person)  Thought Content:Tangential; Rumination; Paranoid Ideation  History of Schizophrenia/Schizoaffective disorder:No  Duration of Psychotic Symptoms:N/A  Hallucinations:Hallucinations: None (Had auditory hallucinations in the past.  Has seen shadows at the corner of her eyes in the past)  Ideas of Reference:None  Suicidal Thoughts:Suicidal Thoughts: Yes, Passive SI Passive Intent and/or Plan: Without Intent; Without Plan  Homicidal Thoughts:Homicidal Thoughts: No   Sensorium  Memory:Immediate Good; Recent Fair; Remote Poor  Judgment:Poor  Insight:Fair   Executive Functions  Concentration:Fair  Attention Span:Fair  [place]  Language:Good   Psychomotor Activity  Psychomotor Activity:Psychomotor Activity: Normal   Assets  Assets:Communication Skills; Desire for Improvement; Housing; Catering manager; Resilience   Sleep  Sleep:Sleep: Fair    Physical Exam: Physical Exam Vitals and nursing note  reviewed.  Constitutional:      Appearance: Normal appearance.  HENT:     Head: Normocephalic.  Eyes:     Extraocular Movements: Extraocular movements intact.  Pulmonary:     Effort: Pulmonary effort is normal.  Musculoskeletal:        General: Normal range of motion.     Cervical back: Normal range of motion.  Neurological:     General: No focal deficit present.     Mental Status: She is alert and oriented to person, place, and time.  Psychiatric:        Attention and Perception: Attention normal.        Mood and Affect: Mood is anxious and depressed.        Speech: Speech normal.        Behavior: Behavior is cooperative.        Thought Content: Thought content is not paranoid or delusional. Thought content does not include homicidal or suicidal ideation.        Cognition and Memory: Cognition and memory normal.        Judgment: Judgment is impulsive.   Review of Systems  Constitutional:  Negative for fever.  Respiratory:  Negative for cough.   Cardiovascular:  Negative for chest pain.  Gastrointestinal:  Negative for nausea and vomiting.  Musculoskeletal:  Positive for myalgias.       Left wrist pain, brace present  Neurological:  Negative for headaches.  Psychiatric/Behavioral:  Positive for depression and suicidal ideas.   Blood pressure 111/81, pulse 99, temperature (!) 97.5 F (36.4 C), temperature source Oral, resp. rate 17, height 5\' 2"  (1.575 m), weight 100.8 kg, last menstrual period 11/20/2020, SpO2 99 %. Body mass index is 40.64 kg/m.   COGNITIVE FEATURES THAT CONTRIBUTE TO RISK:  Polarized thinking    SUICIDE RISK:   Mild:  Suicidal ideation of  limited frequency, intensity, duration, and specificity.  There are no identifiable plans, no associated intent, mild dysphoria and related symptoms, good self-control (both objective and subjective assessment), few other risk factors, and identifiable protective factors, including available and accessible social support.  PLAN OF CARE: Safety and Monitoring --  Admission to inpatient psychiatric unit for safety, stabilization and treatment -- Daily contact with patient to assess and evaluate symptoms and progress in treatment -- Patient's case to be discussed in multi-disciplinary team meeting. -- Patient will be encouraged to participate in the therapeutic group milieu. -- Observation Level : q15 minute checks -- Vital signs:  q12 hours -- Precautions: suicide.  Plan  -Monitor Vitals. -Monitor for thoughts of harm to self or others -Monitor for psychosis, disorganization or changes to cognition -Monitor for withdrawal symptoms. -Monitor for medication side effects.  Labs/Studies: Nutrition labs, A1c, lipids  Medications: Start cross titrating neurontin for topamax for chronic pain, addiction (and hoepfully help with weight reduction) Minipress for nightmares and PTSD CIWA and thiamine for alcohol use  Latuda for mood stabilization    I certify that inpatient services furnished can reasonably be expected to improve the patient's condition.   Maida Sale, MD 12/20/2020, 3:49 PM

## 2020-12-20 NOTE — [facility] Group Notes (Signed)
Adult Psychoeducational Group Note  Date:  12/20/2020 Time:  3:01 PM  Group Topic/Focus:  Goals Group:   The focus of this group is to help patients establish daily goals to achieve during treatment and discuss how the patient can incorporate goal setting into their daily lives to aide in recovery.  Participation Level:  Active  Participation Quality:  Appropriate  Affect:  Appropriate  Cognitive:  Appropriate  Insight: Appropriate  Engagement in Group:  Engaged  Modes of Intervention:  Discussion  Additional Comments:  Patient attended morning orientation/goal group and participated.   Ashaz Robling W Lilyanah Celestin [id number]/00/9417, 3:01 PM

## 2020-12-20 NOTE — Group Note (Signed)
Recreation Therapy Group Note   Group Topic:Stress Management  Group Date: 12/20/2020 Start Time: 0930 End Time: 0945 Facilitators: Victorino Sparrow, [place] Location: [address] Day[address]  Goal Area(s) Addresses:  Patient will identify positive stress management techniques. Patient will identify benefits of using stress management post d/c.  Group Description:  Meditation.  LRT played a meditation that focused on setting personal boundaries putting, saying to others can be a way of saying yes to yourself and putting yourself first as a form of self care.  Patients were to listen and follow along as meditation played to fully engage.     Affect/Mood: Appropriate   Participation Level: Engaged   Participation Quality: Independent   Behavior: Appropriate   Speech/Thought Process: Focused   Insight: Good   Judgement: Good   Modes of Intervention: Meditation   Patient Response to Interventions:  Engaged   Education Outcome:  Acknowledges education and In group clarification offered    Clinical Observations/Individualized Feedback: Pt attended and participated in group session.     Plan: Continue to engage patient in RT group sessions 2-3x/week.   Victorino Sparrow, LRT,CTRS 12/20/2020 10:49 AM
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
## 2020-12-20 NOTE — H&P (Signed)
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 multivitamin with minerals daily. -Continue folic acid 1 mg daily. -Continue Zofran 4 mg every 6 hours as needed for nausea or vomiting. -Continue Imodium 2 to 4 mg as needed for diarrhea or loose stools for 72 hours.   High TSH TSH 4.865 Order T3, T4  Asthma Continue Dulera, Flonase  PRN's  -Continue Tylenol 650 mg every 6 hours as needed for pain or fever -Continue Milk of Magnesia 30 ml PRN Daily for Constipation. -Continue Maalox/Mylanta 30 ml Q4H PRN for Indigestion. -Continue Hydroxyzine 50 mg TID PRN for Anxiety. -Continue Trazodone 50 mg QHS PRN for sleep.  -Pepto-Bismol for indigestion  Discharge Planning: Social work and case management to assist with discharge planning and identification of hospital follow-up  needs prior to discharge Estimated LOS: 5-7 days Discharge Concerns: Need to establish a safety plan; Medication compliance and effectiveness Discharge Goals: Return home with outpatient referrals for mental health follow-up including medication management/psychotherapy.  Observation Level/Precautions:  Elopement, suicide, assault  Laboratory: See above  Psychotherapy: Patient will be encouraged to attend groups  Medications: See treatment plan  Consultations: None  Discharge Concerns: Need stabilization of psychiatric symptoms.  Estimated LOS: 5 to 7 days  Other:     Physician Treatment Plan for Primary Diagnosis: Bipolar 2 disorder, major depressive episode ([place]) Long Term Goal(s): Improvement in symptoms so as ready for discharge  Short Term Goals: Ability to identify changes in lifestyle to reduce recurrence of condition will improve, Ability to verbalize feelings will improve, Ability to disclose and discuss suicidal ideas, Ability to demonstrate self-control will improve, Ability to identify and develop effective coping behaviors will improve, Ability to maintain clinical measurements within normal limits will improve, Compliance with prescribed medications will improve, and Ability to identify triggers associated with substance abuse/mental health issues will improve  Physician Treatment Plan for Secondary Diagnosis: Principal Problem:   Bipolar 2 disorder, major depressive episode ([place]) Active Problems:   Borderline personality disorder ([place])   PTSD (post-traumatic stress disorder)   MDD (major depressive disorder), recurrent episode, severe ([place])  Long Term Goal(s): Improvement in symptoms so as ready for discharge  Short Term Goals: Ability to identify changes in  lifestyle to reduce recurrence of condition will improve, Ability to verbalize feelings will improve, Ability to disclose and discuss suicidal ideas, Ability to demonstrate self-control will improve, Ability to identify  and develop effective coping behaviors will improve, Ability to maintain clinical measurements within normal limits will improve, Compliance with prescribed medications will improve, and Ability to identify triggers associated with substance abuse/mental health issues will improve  I certify that inpatient services furnished can reasonably be expected to improve the patient's condition.    Armando Reichert, MD PGY 2 11/18/20221:51 PM

## 2020-12-20 NOTE — [facility] Group Notes (Signed)
Patient was provided worksheets  ([facility]) instead group because of Covid

## 2020-12-20 NOTE — Group Note (Signed)
[name] Group Therapy Note   Group Date: 12/20/2020 Start Time: 1300 End Time: 1400  Type of Therapy:  Group Therapy   Topic of Therapy:  Gratitude   Participation Level:  Active   Due to the COVID-19 pandemic, this group has been supplemented with worksheets.   Summary of Progress/Problems: CSW provided worksheet to patient due to [place]. CSW offered to meet individually with patient as needed.  Mliss Fritz, LCSWA 12/20/2020  1:11 PM

## 2020-12-20 NOTE — Progress Notes (Signed)
   12/20/20 2236  Psych Admission Type (Psych Patients Only)  Admission Status Voluntary  Psychosocial Assessment  Patient Complaints Anxiety  Eye Contact Fair  Facial Expression Anxious  Affect Appropriate to circumstance  Speech Logical/coherent  Interaction Assertive  Motor Activity Fidgety  Appearance/Hygiene Bizarre  Behavior Characteristics Appropriate to situation  Mood Anxious;Pleasant  Thought Process  Coherency Concrete thinking  Content WDL  Delusions WDL  Perception WDL  Hallucination None reported or observed  Judgment Poor  Confusion None  Danger to Self  Current suicidal ideation? Denies  Self-Injurious Behavior No self-injurious ideation or behavior indicators observed or expressed   Agreement Not to Harm Self Yes  Description of Agreement Verbal contract  Danger to Others  Danger to Others None reported or observed

## 2020-12-20 NOTE — BH IP Treatment Plan (Signed)
Interdisciplinary Treatment and Diagnostic Plan Update  12/20/2020 Time of Session:  [name] MRN: [id number]  Principal Diagnosis: MDD (major depressive disorder), recurrent episode, severe ([place])  Secondary Diagnoses: Principal Problem:   MDD (major depressive disorder), recurrent episode, severe ([place])   Current Medications:  Current Facility-Administered Medications  Medication Dose Route Frequency Provider Last Rate Last Admin   acetaminophen (TYLENOL) tablet 650 mg  650 mg Oral Q6H PRN Starkes-Perry, Gayland Curry, FNP       alum & mag hydroxide-simeth (MAALOX/MYLANTA) 200-200-20 MG/5ML suspension 30 mL  30 mL Oral Q4H PRN Starkes-Perry, Gayland Curry, FNP       bismuth subsalicylate (PEPTO BISMOL) chewable tablet 524 mg  524 mg Oral Q6H PRN Nelda Marseille, Amy E, MD       fluticasone (FLONASE) 50 MCG/ACT nasal spray 2 spray  2 spray Each Nare Daily Suella Broad, FNP   2 spray at 02/63/78 5885   folic acid (FOLVITE) tablet 1 mg  1 mg Oral Daily Doda, Vandana, MD       gabapentin (NEURONTIN) capsule 600 mg  600 mg Oral TID Suella Broad, FNP   600 mg at 12/20/20 0755   hydrOXYzine (ATARAX/VISTARIL) tablet 50 mg  50 mg Oral TID PRN Suella Broad, FNP       levonorgestrel-ethinyl estradiol (NORDETTE) 0.15-30 MG-MCG per tablet 1 tablet  1 tablet Oral Daily Starkes-Perry, Gayland Curry, FNP       loperamide (IMODIUM) capsule 2-4 mg  2-4 mg Oral PRN Doda, Vandana, MD       LORazepam (ATIVAN) tablet 1 mg  1 mg Oral Q6H PRN Doda, Vandana, MD       mometasone-formoterol (DULERA) 200-5 MCG/ACT inhaler 2 puff  2 puff Inhalation BID Suella Broad, FNP   2 puff at 12/20/20 0756   montelukast (SINGULAIR) tablet 10 mg  10 mg Oral QHS Suella Broad, FNP   10 mg at 12/19/20 2139   multivitamin with minerals tablet 1 tablet  1 tablet Oral Daily Armando Reichert, MD       OLANZapine (ZYPREXA) tablet 10 mg  10 mg Oral QHS Suella Broad, FNP   10 mg at 12/19/20 2139    ondansetron (ZOFRAN-ODT) disintegrating tablet 4 mg  4 mg Oral Q6H PRN Armando Reichert, MD       pantoprazole (PROTONIX) EC tablet 40 mg  40 mg Oral Daily Suella Broad, FNP   40 mg at 12/20/20 0820   prazosin (MINIPRESS) capsule 1 mg  1 mg Oral Daily Nelda Marseille, Amy E, MD   1 mg at 12/20/20 0820   prazosin (MINIPRESS) capsule 3 mg  3 mg Oral QHS Suella Broad, FNP   3 mg at 12/19/20 2139   thiamine (B-1) injection 100 mg  100 mg Intramuscular Once Armando Reichert, MD       [START ON 12/21/2020] thiamine tablet 100 mg  100 mg Oral Daily Armando Reichert, MD       PTA Medications: Medications Prior to Admission  Medication Sig Dispense Refill Last Dose   albuterol (PROVENTIL) (2.5 MG/3ML) 0.083% nebulizer solution Take 3 mLs (2.5 mg total) by nebulization every 6 (six) hours as needed for wheezing or shortness of breath. 150 mL 1    bismuth subsalicylate (PEPTO BISMOL) 262 MG/15ML suspension Take 30 mLs by mouth every 6 (six) hours as needed for indigestion or diarrhea or loose stools.      budesonide-formoterol (SYMBICORT) 160-4.5 MCG/ACT inhaler Inhale 2 puffs into the lungs  2 (two) times daily. 10.2 g 5    fluconazole (DIFLUCAN) 150 MG tablet 1 tab by mouth every 3 days as needed (Patient not taking: Reported on 10/23/2020) 2 tablet 1    FLUoxetine (PROZAC) 40 MG capsule Take 1 capsule (40 mg total) by mouth daily. (Patient not taking: Reported on 10/23/2020) 30 capsule 0    fluticasone (FLONASE) 50 MCG/ACT nasal spray Place 2 sprays into both nostrils daily. (Patient taking differently: Place 2 sprays into both nostrils at bedtime.) 16 g 5    gabapentin (NEURONTIN) 600 MG tablet Take 600 mg by mouth 3 (three) times daily.      hydrocortisone (ANUSOL-HC) 25 MG suppository Place 1 suppository (25 mg total) rectally 2 (two) times daily. (Patient not taking: Reported on 12/19/2020) 12 suppository 1    hydrOXYzine (ATARAX/VISTARIL) 50 MG tablet Take 1 tablet (50 mg total) by mouth in the  morning and at bedtime. 1 tablet in the morning. 2 tablets at night (Patient not taking: Reported on 12/19/2020) 60 tablet 5    hydrOXYzine (VISTARIL) 25 MG capsule Take 25 mg by mouth every 4 (four) hours as needed for anxiety.      ibuprofen (ADVIL) 600 MG tablet Take 1 tablet (600 mg total) by mouth every 6 (six) hours as needed for moderate pain. (Patient not taking: Reported on 12/19/2020) 30 tablet 0    levonorgestrel-ethinyl estradiol (NORDETTE) 0.15-30 MG-MCG tablet Take 0.15 mg by mouth daily.      montelukast (SINGULAIR) 10 MG tablet Take 1 tablet (10 mg total) by mouth at bedtime. 30 tablet 5    mupirocin cream (BACTROBAN) 2 % Apply 1 application topically 2 (two) times daily. (Patient not taking: Reported on 12/19/2020) 15 g 0    OLANZapine (ZYPREXA) 10 MG tablet Take 10 mg by mouth at bedtime.      omeprazole (PRILOSEC) 20 MG capsule Take 1 capsule (20 mg total) by mouth daily. (Patient not taking: Reported on 12/19/2020) 30 capsule 2    omeprazole (PRILOSEC) 40 MG capsule Take 40 mg by mouth 2 (two) times daily.      potassium chloride SA (KLOR-CON) 20 MEQ tablet Take 20 mEq by mouth daily.      prazosin (MINIPRESS) 1 MG capsule Take 1-3 mg by mouth See admin instructions. Take 26m in the morning and 362m before bedtime.      triamcinolone cream (KENALOG) 0.1 % Apply 1 application topically 2 (two) times daily. Caution applying to breast tissue -- limit to one week total and apply only to areas with rash. Limit use to 2-3 weeks elsewhere on body. (Patient not taking: Reported on 12/19/2020) 80 g 0    vitamin B-12 (CYANOCOBALAMIN) 1000 MCG tablet Take 1 tablet (1,000 mcg total) by mouth daily. (Patient not taking: Reported on 07/13/2020) 90 tablet 1     Patient Stressors: Financial difficulties   Health problems   Occupational concerns   Substance abuse    Patient Strengths: Ability for insight  Average or above average intelligence  Capable of independent living  Communication  skills  General fund of knowledge  Religious Affiliation  Supportive family/friends  Work skills   Treatment Modalities: Medication Management, Group therapy, Case management,  1 to 1 session with clinician, Psychoeducation, Recreational therapy.   Physician Treatment Plan for Primary Diagnosis: MDD (major depressive disorder), recurrent episode, severe [place]):     Short Term Goals:    Medication Management: Evaluate patient's response, side effects, and tolerance of medication  regimen.  Therapeutic Interventions: 1 to 1 sessions, Unit Group sessions and Medication administration.  Evaluation of Outcomes: Not Met  Physician Treatment Plan for Secondary Diagnosis: Principal Problem:   MDD (major depressive disorder), recurrent episode, severe ([place])  Long Term Goal(s):     Short Term Goals:       Medication Management: Evaluate patient's response, side effects, and tolerance of medication regimen.  Therapeutic Interventions: 1 to 1 sessions, Unit Group sessions and Medication administration.  Evaluation of Outcomes: Not Met   RN Treatment Plan for Primary Diagnosis: MDD (major depressive disorder), recurrent episode, severe ([place]) Long Term Goal(s): Knowledge of disease and therapeutic regimen to maintain health will improve  Short Term Goals: Ability to participate in decision making will improve, Ability to verbalize feelings will improve, and Ability to identify and develop effective coping behaviors will improve  Medication Management: RN will administer medications as ordered by provider, will assess and evaluate patient's response and provide education to patient for prescribed medication. RN will report any adverse and/or side effects to prescribing provider.  Therapeutic Interventions: 1 on 1 counseling sessions, Psychoeducation, Medication administration, Evaluate responses to treatment, Monitor vital signs and CBGs as ordered, Perform/monitor CIWA,  COWS, AIMS and Fall Risk screenings as ordered, Perform wound care treatments as ordered.  Evaluation of Outcomes: Not Met   [name] Treatment Plan for Primary Diagnosis: MDD (major depressive disorder), recurrent episode, severe ([place]) Long Term Goal(s): Safe transition to appropriate next level of care at discharge, Engage patient in therapeutic group addressing interpersonal concerns.  Short Term Goals: Engage patient in aftercare planning with referrals and resources, Increase ability to appropriately verbalize feelings, and Facilitate acceptance of mental health diagnosis and concerns  Therapeutic Interventions: Assess for all discharge needs, 1 to 1 time with Social worker, Explore available resources and support systems, Assess for adequacy in community support network, Educate family and significant other(s) on suicide prevention, Complete Psychosocial Assessment, Interpersonal group therapy.  Evaluation of Outcomes: Not Met   Progress in Treatment: Attending groups: Yes. Participating in groups: Yes. Taking medication as prescribed: Yes. Toleration medication: Yes. Family/Significant other contact made: No, will contact:  CSW will obtain consent Patient understands diagnosis: Yes. Discussing patient identified problems/goals with staff: Yes. Medical problems stabilized or resolved: Yes. Denies suicidal/homicidal ideation: Yes. Issues/concerns per patient self-inventory: No. Other: None  New problem(s) identified: No, Describe:  None  New Short Term/Long Term Goal(s):medication stabilization, elimination of SI thoughts, development of comprehensive mental wellness plan.   Patient Goals:  Did Not Attend  Discharge Plan or Barriers: Patient recently admitted. CSW will continue to follow and assess for appropriate referrals and possible discharge planning.   Reason for Continuation of Hospitalization: Depression Medication stabilization Suicidal ideation  Estimated Length of  Stay: 3-5 days   Scribe for Treatment Team: Eliott Nine 12/20/2020 11:33 AM

## 2020-12-21 DIAGNOSIS — L739 Follicular disorder, unspecified: Secondary | ICD-10-CM | POA: Diagnosis present

## 2020-12-21 MED ORDER — BACITRACIN ZINC 500 UNIT/GM EX OINT
TOPICAL_OINTMENT | Freq: Two times a day (BID) | CUTANEOUS | Status: DC
  Administered 2020-12-21: 1 via TOPICAL
  Administered 2020-12-22: 31.5556 via TOPICAL
  Filled 2020-12-21 (×2): qty 28.35

## 2020-12-21 MED ORDER — SULFAMETHOXAZOLE-TRIMETHOPRIM 400-80 MG PO TABS
1.0000 | ORAL_TABLET | Freq: Two times a day (BID) | ORAL | Status: DC
  Administered 2020-12-21 – 2020-12-24 (×7): 1 via ORAL
  Filled 2020-12-21 (×10): qty 1

## 2020-12-21 MED ORDER — SULFAMETHOXAZOLE-TRIMETHOPRIM 400-80 MG PO TABS: 1.0000 | ORAL_TABLET | Freq: Two times a day (BID) | ORAL | Status: DC

## 2020-12-21 NOTE — [facility] Group Notes (Signed)
Adult Psychoeducational Group Note  Date:  12/21/2020 Time:  6:33 PM  Group Topic/Focus:  Goals Group:   The focus of this group is to help patients establish daily goals to achieve during treatment and discuss how the patient can incorporate goal setting into their daily lives to aide in recovery.  Participation Level:  Active  Participation Quality:  Appropriate  Affect:  Appropriate  Cognitive:  Appropriate  Insight: Appropriate  Engagement in Group:  Engaged  Modes of Intervention:  Discussion  Additional Comments:  Patient attended morning goal setting group and participated.    [name] [name] 64/84/7207, 6:33 PM

## 2020-12-21 NOTE — [facility] Group Notes (Signed)
.  Psychoeducational Group Note    Date:12/07/2020 Time: 1300-1400    Purpose of Group: . The group focus' on teaching patients on how to identify their needs and how Life Skills:  A group where two lists are made. What people need and what are things that we do that are healthy. The lists are developed by the patients and it is explained that we often do the actions that are not healthy to get our list of needs met.  to develop the coping skills needed to get their needs met  Participation Level:  Active  Participation Quality:  Appropriate  Affect:  Appropriate  Cognitive:  Oriented  Insight:  Improving  Engagement in Group:  Engaged  Additional Comments:  energy level is 4. Pt partisipated fully in the group.  [name]

## 2020-12-21 NOTE — Progress Notes (Signed)
[facility] MD Progress Note  12/21/2020 2:02 PM [name]  MRN:  [id number] Subjective:   [name] is a 35 yr old female with PPHx significant for PTSD, MDD, Bipolar 2, Borderline Personality Disorder, self-harm behaviors (cutting), multiple psychiatric admissions initially presented to [facility] after a Suicide Attempt via Overdose (18 pills of 2.5 mg Zyprexa).    Psychiatric Team made the following Recommendations yesterday: -Stop Zyprexa -Start Latuda 40 mg daily -Start Topamax 25 mg BID -Continue Gabapentin 600 mg TID -Continue Prazosin 1 mg AM and 3 mg QHS -Continue Melatonin 5 mg QHS -Check T3 and T4   Case was discussed in the multidisciplinary team. MAR was reviewed and patient was compliant with medications.    On interview today patient reports she is doing good today. She reports she slept well last night.  She reports her appetite is doing good.  She reports no SI, HI, or AVH.  Her main concern today is the cost of her medicine.  She reports that she talked with a friend of hers who had been prescribed Latuda and since he did not have insurance would have paid over $1000 a month.  She reports that though she has insurance if the cost is still several hundred a month she cannot afford it.  Discussed that I would call her pharmacy to find out the copay.  She was agreeable to this and reports using [facility].  She has no other concerns at present.   Update: After calling her pharmacy discussed with patient that copay for a 30 day supply of 40 mg Latuda would be $30 but that the pharmacist told me there was a coupon card that would further decrease the price.  She stated that she could afford this and so would like to continue with Latuda.   Principal Problem: Bipolar 2 disorder, major depressive episode (HCC) Diagnosis: Principal Problem:   Bipolar 2 disorder, major depressive episode ([place]) Active Problems:   Borderline personality disorder (HCC)   PTSD  (post-traumatic stress disorder)   MDD (major depressive disorder), recurrent episode, severe ([place])  Total Time spent with patient: 30 minutes  Past Psychiatric History: PTSD, MDD, Bipolar 2, Borderline Personality Disorder, self-harm behaviors (cutting), multiple psychiatric admissions  Past Medical History:  Past Medical History:  Diagnosis Date   Allergy    Anxiety    Asthma    Bipolar 2 disorder ([place])    Borderline personality disorder ([place])    Depression    Eczema    Former smoker    Obesity    PTSD (post-traumatic stress disorder)    Urticaria     Past Surgical History:  Procedure Laterality Date   Thumb surgery Left    Family History:  Family History  Problem Relation Age of Onset   Healthy Mother    Healthy Father    Alzheimer's disease Maternal Grandmother    Skin cancer Paternal Grandmother    Allergic rhinitis Paternal Grandmother    Asthma Paternal Grandmother    Angioedema Neg Hx    Eczema Neg Hx    Urticaria Neg Hx    Family Psychiatric  History: Unknown Social History:  Social History   Substance and Sexual Activity  Alcohol Use Yes   Alcohol/week: 8.0 standard drinks   Types: 8 Cans of beer per week   Comment: occ     Social History   Substance and Sexual Activity  Drug Use Yes   Frequency: 4.0 times per week   Types:  Marijuana   Comment: Last smoked this morning-03/09/19    Social History   Socioeconomic History   Marital status: Single    Spouse name: Not on file   Number of children: 0   Years of education: 13   Highest education level: Not on file  Occupational History   Not on file  Tobacco Use   Smoking status: Former    Packs/day: 0.50    Years: 9.00    Pack years: 4.50    Types: Cigarettes    Quit date: 12/04/2015    Years since quitting: 5.0   Smokeless tobacco: Never  Vaping Use   Vaping Use: Former  Substance and Sexual Activity   Alcohol use: Yes    Alcohol/week: 8.0 standard drinks    Types: 8 Cans of beer per  week    Comment: occ   Drug use: Yes    Frequency: 4.0 times per week    Types: Marijuana    Comment: Last smoked this morning-03/09/19   Sexual activity: Yes    Birth control/protection: Pill  Other Topics Concern   Not on file  Social History Narrative   Fun: Play video games and watch movies.   Denies abuse and feels safe at home.   Social Determinants of Health   Financial Resource Strain: Not on file  Food Insecurity: Not on file  Transportation Needs: Not on file  Physical Activity: Not on file  Stress: Not on file  Social Connections: Not on file   Additional Social History:                         Sleep: Good  Appetite:  Good  Current Medications: Current Facility-Administered Medications  Medication Dose Route Frequency Provider Last Rate Last Admin   acetaminophen (TYLENOL) tablet 650 mg  650 mg Oral Q6H PRN Starkes-Perry, Gayland Curry, FNP       alum & mag hydroxide-simeth (MAALOX/MYLANTA) 200-200-20 MG/5ML suspension 30 mL  30 mL Oral Q4H PRN Starkes-Perry, Gayland Curry, FNP       bismuth subsalicylate (PEPTO BISMOL) chewable tablet 524 mg  524 mg Oral Q6H PRN Nelda Marseille, Amy E, MD       fluticasone (FLONASE) 50 MCG/ACT nasal spray 2 spray  2 spray Each Nare Daily Suella Broad, FNP   2 spray at 12/87/86 7672   folic acid (FOLVITE) tablet 1 mg  1 mg Oral Daily Doda, Vandana, MD   1 mg at 12/21/20 0803   gabapentin (NEURONTIN) capsule 600 mg  600 mg Oral TID Suella Broad, FNP   600 mg at 12/21/20 1257   hydrOXYzine (ATARAX/VISTARIL) tablet 50 mg  50 mg Oral TID PRN Suella Broad, FNP   50 mg at 12/21/20 0910   levonorgestrel-ethinyl estradiol (NORDETTE) 0.15-30 MG-MCG per tablet 1 tablet  1 tablet Oral Daily Starkes-Perry, Gayland Curry, FNP       loperamide (IMODIUM) capsule 2-4 mg  2-4 mg Oral PRN Armando Reichert, MD       LORazepam (ATIVAN) tablet 1 mg  1 mg Oral Q6H PRN Armando Reichert, MD   1 mg at 12/20/20 2006   lurasidone (LATUDA) tablet 40  mg  40 mg Oral Q supper Armando Reichert, MD   40 mg at 12/20/20 1748   melatonin tablet 5 mg  5 mg Oral QHS Armando Reichert, MD   5 mg at 12/20/20 2107   mometasone-formoterol (DULERA) 200-5 MCG/ACT inhaler 2 puff  2 puff Inhalation  BID Suella Broad, FNP   2 puff at 12/21/20 0802   montelukast (SINGULAIR) tablet 10 mg  10 mg Oral QHS Suella Broad, FNP   10 mg at 12/20/20 2107   multivitamin with minerals tablet 1 tablet  1 tablet Oral Daily Armando Reichert, MD   1 tablet at 12/21/20 0802   ondansetron (ZOFRAN-ODT) disintegrating tablet 4 mg  4 mg Oral Q6H PRN Armando Reichert, MD       pantoprazole (PROTONIX) EC tablet 40 mg  40 mg Oral Daily Suella Broad, FNP   40 mg at 12/21/20 0803   prazosin (MINIPRESS) capsule 1 mg  1 mg Oral Daily Nelda Marseille, Amy E, MD   1 mg at 12/21/20 0802   prazosin (MINIPRESS) capsule 3 mg  3 mg Oral QHS Suella Broad, FNP   3 mg at 12/20/20 2107   sulfamethoxazole-trimethoprim (BACTRIM) 400-80 MG per tablet 1 tablet  1 tablet Oral Q12H Briant Cedar, MD       thiamine (B-1) injection 100 mg  100 mg Intramuscular Once Armando Reichert, MD       thiamine tablet 100 mg  100 mg Oral Daily Rosita Kea, Edd Arbour, MD   100 mg at 12/21/20 0803   topiramate (TOPAMAX) tablet 25 mg  25 mg Oral BID Armando Reichert, MD   25 mg at 12/21/20 0802   traZODone (DESYREL) tablet 50 mg  50 mg Oral QHS PRN Armando Reichert, MD        Lab Results:  Results for orders placed or performed during the hospital encounter of 12/19/20 (from the past 48 hour(s))  TSH     Status: Abnormal   Collection Time: 12/20/20  6:39 AM  Result Value Ref Range   TSH 4.865 (H) 0.350 - 4.500 uIU/mL    Comment: Performed by a 3rd Generation assay with a functional sensitivity of <=0.01 uIU/mL. Performed at [facility], [place] [address] [place], [place] [address]   Lipid panel     Status: Abnormal   Collection Time: 12/20/20  6:39 AM  Result Value Ref Range    Cholesterol 202 (H) 0 - 200 mg/dL   Triglycerides 103 <150 mg/dL   HDL 54 >40 mg/dL   Total CHOL/HDL Ratio 3.7 RATIO   VLDL 21 0 - 40 mg/dL   LDL Cholesterol 127 (H) 0 - 99 mg/dL    Comment:        Total Cholesterol/HDL:CHD Risk Coronary Heart Disease Risk Table                     Men   Women  1/2 Average Risk   3.4   3.3  Average Risk       5.0   4.4  2 X Average Risk   9.6   7.1  3 X Average Risk  23.4   11.0        Use the calculated Patient Ratio above and the CHD Risk Table to determine the patient's CHD Risk.        ATP III CLASSIFICATION (LDL):  <100     mg/dL   Optimal  100-129  mg/dL   Near or Above                    Optimal  130-159  mg/dL   Borderline  160-189  mg/dL   High  >190     mg/dL   Very High Performed at [facility], [address] [place], [place] [address]   Hemoglobin A1c     Status: None   Collection Time: 12/20/20  6:39 AM  Result Value Ref Range   Hgb A1c MFr Bld 5.2 4.8 - 5.6 %    Comment: (NOTE) Pre diabetes:          5.7%-6.4%  Diabetes:              >6.4%  Glycemic control for   <7.0% adults with diabetes    Mean Plasma Glucose 102.54 mg/dL    Comment: Performed at [place] [address] [place], [place] [address]  T4, free     Status: None   Collection Time: 12/20/20  6:22 PM  Result Value Ref Range   Free T4 0.97 0.61 - 1.12 ng/dL    Comment: (NOTE) Biotin ingestion may interfere with free T4 tests. If the results are inconsistent with the TSH level, previous test results, or the clinical presentation, then consider biotin interference. If needed, order repeat testing after stopping biotin. Performed at [facility], [place] [address], [place], [place] [address]     Blood Alcohol level:  Lab Results  Component Value Date   ETH 176 (H) 12/18/2020   ETH  11/20/2009    <5        LOWEST DETECTABLE LIMIT FOR SERUM ALCOHOL IS 5 mg/dL FOR MEDICAL PURPOSES ONLY    Metabolic Disorder  Labs: Lab Results  Component Value Date   HGBA1C 5.2 12/20/2020   MPG 102.54 12/20/2020   No results found for: PROLACTIN Lab Results  Component Value Date   CHOL 202 (H) 12/20/2020   TRIG 103 12/20/2020   HDL 54 12/20/2020   CHOLHDL 3.7 12/20/2020   VLDL 21 12/20/2020   LDLCALC 127 (H) 12/20/2020   LDLCALC 100 (H) 07/10/2020    Physical Findings: AIMS: Facial and Oral Movements Muscles of Facial Expression: None, normal Lips and Perioral Area: None, normal Jaw: None, normal Tongue: None, normal,Extremity Movements Upper (arms, wrists, hands, fingers): None, normal Lower (legs, knees, ankles, toes): None, normal, Trunk Movements Neck, shoulders, hips: None, normal, Overall Severity Severity of abnormal movements (highest score from questions above): None, normal Incapacitation due to abnormal movements: None, normal Patient's awareness of abnormal movements (rate only patient's report): No Awareness, Dental Status Current problems with teeth and/or dentures?: No Does patient usually wear dentures?: No  CIWA:  CIWA-Ar Total: 3 COWS:     Musculoskeletal: Strength & Muscle Tone: within normal limits Gait & Station: normal Patient leans: N/A  Psychiatric Specialty Exam:  Presentation  General Appearance: Appropriate for Environment; Casual; Fairly Groomed  Eye Contact:Good  Speech:Clear and Coherent; Normal Rate  Speech Volume:Normal  Handedness:Right   Mood and Affect  Mood:Dysphoric; Anxious; Labile  Affect:Labile   Thought Process  Thought Processes:Coherent; Goal Directed  Descriptions of Associations:Intact  Orientation:Full (Time, Place and Person)  Thought Content:Rumination  History of Schizophrenia/Schizoaffective disorder:No  Duration of Psychotic Symptoms:N/A  Hallucinations:Hallucinations: None  Ideas of Reference:None  Suicidal Thoughts:Suicidal Thoughts: No SI Passive Intent and/or Plan: Without Intent; Without Plan  Homicidal  Thoughts:Homicidal Thoughts: No   Sensorium  Memory:Immediate Fair; Recent Fair  Judgment:Fair  Insight:Fair   Executive Functions  Concentration:Fair  Attention Span:Fair  [place] of Knowledge:Good  Language:Good   Psychomotor Activity  Psychomotor Activity:Psychomotor Activity: Normal   Assets  Assets:Communication Skills; Desire for Improvement; Resilience   Sleep  Sleep:Sleep: Good Number of Hours of Sleep: 6.75  Physical Exam: Physical Exam Vitals and nursing note reviewed.  Constitutional:      Appearance: Normal appearance. She is obese.  HENT:     Head: Normocephalic and atraumatic.  Pulmonary:     Effort: Pulmonary effort is normal.  Musculoskeletal:        General: Normal range of motion.  Skin:         Comments: Erythematous, warm  Neurological:     General: No focal deficit present.     Mental Status: She is alert.   Review of Systems  Respiratory:  Negative for cough and shortness of breath.   Cardiovascular:  Negative for chest pain.  Gastrointestinal:  Negative for abdominal pain, constipation, diarrhea, nausea and vomiting.  Neurological:  Negative for weakness and headaches.  Psychiatric/Behavioral:  Positive for depression. Negative for hallucinations and suicidal ideas. The patient is not nervous/anxious.   Blood pressure 114/85, pulse 90, temperature 98.3 F (36.8 C), temperature source Oral, resp. rate 17, height 5\' 2"  (1.575 m), weight 100.8 kg, SpO2 99 %. Body mass index is 40.64 kg/m.   Treatment Plan Summary: Daily contact with patient to assess and evaluate symptoms and progress in treatment and Medication management  [name] is a 35 yr old female with PPHx significant for PTSD, MDD, Bipolar 2, Borderline Personality Disorder, self-harm behaviors (cutting), multiple psychiatric admissions initially presented to [facility] after a Suicide Attempt via Overdose (18 pills of 2.5 mg Zyprexa).    After checking  with patient's pharmacy she is able to pay approx 30 a month for her medication so we will not switch it at this time.  She has an infected follicle that has been present for about 2 weeks and has drained.  We will start TMP-SMX to treat this.  We will continue to monitor.    Bipolar 2, current episode depressed  PTSD: -Continue Latuda 40 mg daily -Continue Topamax 25 mg BID -Continue Gabapentin 600 mg TID -Continue Prazosin 1 mg AM and 3 mg QHS -Continue Melatonin 5 mg QHS   Infected Follicle:  -Start TMP-SMX 400-80 for 7 days (Day 1)   Withdrawal: -Continue CIWA, last score @CIWA @ -Continue Ativan 1 mg q6 PRN CIWA>10 -Continue Imodium 2-4 mg  -Continue Zofran-ODT 4 mg q6 PRN -Continue Pepto Bismol q6 PRN -Continue Thiamine 100 mg daily -Continue Multivitamin daily   Elevated TSH: -T3: 0.97 WNL -Recheck TSH in 3 days   Asthma: -Continue Flonase 2 sprays daily -Continue Dulera 2 puffs BID   -Continue Singular 10 mg QHS -Continue Protonix ER 40 mg daily -Continue PRN's: Tylenol, Maalox, Atarax, Milk of Magnesia, Trazodone    Briant Cedar, MD 12/21/2020, 2:02 PM

## 2020-12-21 NOTE — Progress Notes (Signed)
   12/21/20 2236  Psych Admission Type (Psych Patients Only)  Admission Status Voluntary  Psychosocial Assessment  Patient Complaints Anxiety  Eye Contact Fair  Facial Expression Anxious  Affect Anxious;Appropriate to circumstance  Speech Logical/coherent  Interaction Assertive  Motor Activity Fidgety;Restless  Appearance/Hygiene Bizarre  Behavior Characteristics Appropriate to situation  Mood Depressed;Anxious  Thought Process  Coherency Concrete thinking  Content Blaming others  Delusions None reported or observed  Perception WDL  Hallucination None reported or observed  Judgment Poor  Confusion None  Danger to Self  Current suicidal ideation? Denies  Self-Injurious Behavior No self-injurious ideation or behavior indicators observed or expressed   Agreement Not to Harm Self Yes  Description of Agreement Verbal contract  Danger to Others  Danger to Others None reported or observed

## 2020-12-21 NOTE — [facility] Group Notes (Signed)
.  Psychoeducational Group Note  Date 12/21/2020 Time: 0900-1000    Goal Setting   Purpose of Group: Group Focus: affirmation, clarity of thought, and goals/reality orientation Treatment Modality:  Psychoeducation Interventions utilized were assignment, group exercise, and support  Purpose: To be able to understand and verbalize the reason for their admission to the hospital. To understand that the medication helps with their chemical imbalance but they also need to work on their choices in life. To be challenged to develop a list of 30 positives about themselves. Also introduce the concept that "feelings" are not reality.    Participation Level:  Did not attend   Zula Hovsepian A 

## 2020-12-21 NOTE — Group Note (Signed)
[name] Group Therapy Note  12/21/2020   10:00-11:00am   Type of Therapy and Topic:  Group Therapy: Anger Cues and Responses  Participation Level:  Active   Description of Group:   In this group, patients learned how to recognize the physical, cognitive, emotional, and behavioral responses they have to anger-provoking situations.  They identified a recent time they became angry and how they reacted.  They analyzed how their reaction was possibly beneficial and how it was possibly unhelpful.  The group discussed a variety of healthier coping skills that could help with such a situation in the future.  Focus was placed on how helpful it is to recognize the underlying emotions to our anger, because working on those can lead to a more permanent solution as well as our ability to focus on the important rather than the urgent.  Therapeutic Goals: Patients will remember their last incident of anger and how they felt emotionally and physically, what their thoughts were at the time, and how they behaved. Patients will identify how their behavior at that time worked for them, as well as how it worked against them. Patients will explore possible new behaviors to use in future anger situations. Patients will learn that anger itself is normal and cannot be eliminated, and that healthier reactions can assist with resolving conflict rather than worsening situations.  Summary of Patient Progress:  The patient shared that her most recent time of anger was prior to admission and said her anger was caused when she reached out to people she loves telling them she was suicidal and boyfriend told her she was being "childish" while others told she would not succeed "with that attitude."  She said that the overdosed on purpose in a defiant gesture toward them.  She stated further that she does not normally experience a lot of anger, but admitted she does avoid confrontation as much as possible, until the point that she  "snaps."  Therapeutic Modalities:   Cognitive Behavioral Therapy  Maretta Los

## 2020-12-21 NOTE — Progress Notes (Addendum)
   12/21/20 1000  Psych Admission Type (Psych Patients Only)  Admission Status Voluntary  Psychosocial Assessment  Patient Complaints Anxiety;Depression  Eye Contact Fair  Facial Expression Anxious  Affect Appropriate to circumstance  Speech Logical/coherent  Interaction Assertive  Motor Activity Fidgety  Appearance/Hygiene Bizarre  Behavior Characteristics Cooperative;Appropriate to situation  Mood Anxious;Pleasant  Thought Process  Coherency Concrete thinking  Content WDL  Delusions WDL  Perception WDL  Hallucination None reported or observed  Judgment Poor  Confusion None  Danger to Self  Current suicidal ideation? Denies  Self-Injurious Behavior No self-injurious ideation or behavior indicators observed or expressed   Agreement Not to Harm Self Yes  Description of Agreement Verbal contract  Danger to Others  Danger to Others None reported or observed  D. Pt presented with an anxious affect/ mood- voicing concern that she may not be able to afford the medication [name]) that she is prescribed. Pt has been visible in the milieu interacting well with peers and staff, and has been observed attending groups. Pt currently denies SI/HI and AVH  A. Labs and vitals monitored. Pt compliant with medications. Pt supported emotionally and encouraged to express concerns and ask questions.   R. Pt remains safe with 15 minute checks. Will continue POC.

## 2020-12-21 NOTE — Progress Notes (Signed)
Adult Psychoeducational Group Note  Date:  12/21/2020 Time:  9:41 PM  Group Topic/Focus:  Wrap-Up Group:   The focus of this group is to help patients review their daily goal of treatment and discuss progress on daily workbooks.  Participation Level:  Active  Participation Quality:  Appropriate  Affect:  Appropriate  Cognitive:  Appropriate  Insight: Appropriate  Engagement in Group:  Engaged  Modes of Intervention:  Discussion  Additional Comments:  Pt stated her goal for today was to focus on her treatment plan. Pt stated she accomplished her goal today. Pt stated she talked with her doctor and her social worker about her care today. Pt rated her overall day a 3 out of 10. Pt stated she was able to contact her boyfriend, friend, roommate today which improved her overall day. Pt stated she felt better about herself tonight. Pt stated she was able to attend all groups held today. Pt stated she took all medications provided today. Pt stated her appetite was improved today. Pt rated her sleep last night was pretty good. Pt stated the goal tonight was to get some rest. Pt stated she had some physical pain tonight. Pt stated she had some moderate pain in her stomach tonight because she is on her cycle. Pt rated the moderate pain a 6 on the pain level scale. Pt nurse was updated on the situation. Pt deny visual hallucinations and auditory issues tonight.   Pt denies thoughts of harming others. Pt admitted to dealing with thoughts of harming herself. Pt stated she could contract for safety. Pt stated she would alert staff if anything changed.  Candy Sledge 12/21/2020, 9:41 PM

## 2020-12-22 LAB — RESP PANEL BY RT-PCR (FLU A&B, COVID) ARPGX2
Influenza A by PCR: NEGATIVE
Influenza B by PCR: NEGATIVE
SARS Coronavirus 2 by RT PCR: NEGATIVE

## 2020-12-22 LAB — T3, FREE: T3, Free: 3.3 pg/mL (ref 2.0–4.4)

## 2020-12-22 NOTE — [facility] Group Notes (Signed)
Adult Psychoeducational Group Note  Date:  12/22/2020 Time:  8:20 PM  Group Topic/Focus:  Wrap-Up Group:   The focus of this group is to help patients review their daily goal of treatment and discuss progress on daily workbooks.  Participation Level:  Active  Participation Quality:  Appropriate and Attentive  Affect:  Appropriate  Cognitive:  Alert and Appropriate  Insight: Appropriate and Good  Engagement in Group:  Engaged  Modes of Intervention:  Discussion and Education  Additional Comments:  Pt attended and participated in wrap up group this evening and rated their day a 1/10. Pt stated that they have had an emotional day due to them fighting with a loved one. Pt states that they have been calling and receiving calls from this loved one and was disappointed in not having a visitor today. Pt goal is to figure out their medication and finding out the cost of their medication. Tomorrow pt would like to have a  more "even out" day emotionally.   Cristi Loron 12/22/2020, 8:20 PM

## 2020-12-22 NOTE — Plan of Care (Signed)
  Problem: Coping: Goal: Coping ability will improve Outcome: Progressing   Problem: Safety: Goal: Ability to disclose and discuss suicidal ideas will improve Outcome: Progressing
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
## 2020-12-22 NOTE — Progress Notes (Signed)
   12/22/20 2157  Psych Admission Type (Psych Patients Only)  Admission Status Voluntary  Psychosocial Assessment  Patient Complaints Anxiety;Depression  Eye Contact Fair  Facial Expression Anxious  Affect Appropriate to circumstance  Speech Logical/coherent  Interaction Assertive  Motor Activity Fidgety  Appearance/Hygiene Bizarre  Behavior Characteristics Appropriate to situation  Mood Anxious;Depressed  Thought Process  Coherency Concrete thinking  Content Blaming others  Delusions None reported or observed  Perception WDL  Hallucination None reported or observed  Judgment Poor  Confusion None  Danger to Self  Current suicidal ideation? Denies  Self-Injurious Behavior No self-injurious ideation or behavior indicators observed or expressed   Agreement Not to Harm Self Yes  Description of Agreement Verbal contract  Danger to Others  Danger to Others None reported or observed
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
## 2020-12-22 NOTE — BHH Group Notes (Addendum)
Adult Psychoeducational Group Not Date:  12/22/2020 Time:  0900-1045 Group Topic/Focus: PROGRESSIVE RELAXATION. A group where deep breathing is taught and tensing and relaxation muscle groups is used. Imagery is used as well.  Pts are asked to imagine 3 pillars that hold them up when they are not able to hold themselves up.  Participation Level:  none  Participation Quality:  not Appropriate  Affect:  kept her eyes towards the floor the whole tiime  Cognitive:  Oriented  Insight: lacking  Engagement in Group:  not Engaged  Modes of Intervention:  Activity, Discussion, Education, and Support  Additional Comments:  Pt came to the group. Sat down. Never made eye contact with anyone. Would not state her name or interact with the exercise.  At the end of group each person gets to share a wisdom that they have learned in their lifetime. Pt states her wisdom is "the people who love you always hurt you, so you should not trust anyone".  Paulino Rily

## 2020-12-22 NOTE — Group Note (Signed)
[place] [name] Group Therapy Note  Date/Time:  12/22/2020 11:00am-11:30am  Type of Therapy and Topic:  Group Therapy:  Healthy and Unhealthy Supports  Participation Level:  Did Not Attend   Description of Group:  Patients in this group were led in a discussion about the differences between healthy and unhealthy supports.  They were introduced to the idea of adding a variety of healthy supports to address the various needs in their lives, as well as the idea that unhealthy supports need to be eliminated from their lives through the use of appropriate boundary setting.  A song entitled, "My Own Hero" was played and then discussed.  Patients acknowledged as a whole that it is important to be involved in one's own recovery journey, and that by doing so, they are acting as a hero to themselves.  Therapeutic Goals:   1)  discuss the difference between healthy and unhealthy supports  2)  describe the importance of adding healthy supports to stay well once out of the hospital  3)  discuss options for how to address unhealthy supports  5)  encourage active participation in one's own recovery plan    Summary of Patient Progress:  The patient was invited to group, did not attend.  She did arrive into the room just as the group was being dismissed.   Therapeutic Modalities:   Motivational Interviewing Brief Solution-Focused Therapy  Selmer Dominion, [name]

## 2020-12-23 MED ORDER — LURASIDONE HCL 80 MG PO TABS
80.0000 mg | ORAL_TABLET | Freq: Every day | ORAL | Status: DC
  Administered 2020-12-23: 80 mg via ORAL
  Filled 2020-12-23 (×2): qty 1

## 2020-12-23 NOTE — Plan of Care (Signed)
  Problem: Activity: Goal: Imbalance in normal sleep/wake cycle will improve Outcome: Progressing   Problem: Coping: Goal: Will verbalize feelings Outcome: Progressing   Problem: Health Behavior/Discharge Planning: Goal: Ability to make decisions will improve Outcome: Progressing Goal: Compliance with therapeutic regimen will improve Outcome: Progressing

## 2020-12-23 NOTE — Progress Notes (Signed)
Writer asked [name] 3 times to come to dayroom for vitals. [name] said she was coming. [name] came to the dayroom at 6:38 am writer explain they will get her vitals once shift change. Time to go to cafeteria for breakfast. [name] appear to be angry. RN notify

## 2020-12-23 NOTE — Group Note (Signed)
Occupational Therapy Group Note  Group Topic: Sensory Modulation  Group Date: 12/23/2020 Start Time: 1400 End Time: 1445 Facilitators: Ponciano Ort, OT/L   Group Description: Group encouraged increased engagement and participation through discussion focused on sensory modulation and self-soothing through use of the 8 senses. Discussion introduced the concept of sensory modulation and integration, focusing on how we can utilize our body and it's senses to self-soothe or cope, when we are experiencing an over or under-whelming sensation or feeling. Group members were introduced to a sensory diet checklist as a helpful tool/resource that can be utilized to identify what activities and strategies we prefer and do not prefer based upon our response to different stimulus. The concept of alerting vs calming activities was also introduced to understand how to counteract how we are feeling (Example: when we are feeling overwhelmed/stressed, engage in something calming. When we are feeling depressed/low energy, engage in something alerting). Group members engaged actively in discussion sharing their own personal sensory likes/dislikes.      Therapeutic Goal(s):  Identify self-soothing calming vs alerting activities   Identify and create a sensory diet to assist in self-soothing sensory strategies    Participation Level: Did not attend   Plan: Continue to engage patient in OT groups 2 - 3x/week.  12/23/2020  Ponciano Ort, OT/L

## 2020-12-23 NOTE — BHH Group Notes (Signed)
Pt did not attend orienation goals group

## 2020-12-23 NOTE — BHH Counselor (Signed)
Events affecting the discharge plan:  -awaiting MD approval Interventions by CSW:  -CSW faxed referral to [place] completed safety planning with pt's room mate Emotional response of the patient/family to the plan of care: -Room mate states pt can return at d/c. Safety planning completed.   Toney Reil, [place] Worker [employer]
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
## 2020-12-23 NOTE — Progress Notes (Signed)
Progress note    12/23/20 0800  Psych Admission Type (Psych Patients Only)  Admission Status Voluntary  Psychosocial Assessment  Patient Complaints Anxiety;Helplessness  Eye Contact Fair  Facial Expression Anxious  Affect Anxious;Appropriate to circumstance  Speech Logical/coherent  Interaction Assertive;Attention-seeking  Motor Activity Slow  Appearance/Hygiene Bizarre  Behavior Characteristics Cooperative;Appropriate to situation  Mood Anxious;Apathetic  Thought Process  Coherency Concrete thinking  Content Blaming others;Hypochondria  Delusions None reported or observed  Perception WDL  Hallucination None reported or observed  Judgment Poor  Confusion None  Danger to Self  Current suicidal ideation? Denies  Self-Injurious Behavior No self-injurious ideation or behavior indicators observed or expressed   Danger to Others  Danger to Others None reported or observed

## 2020-12-23 NOTE — BHH Group Notes (Signed)
Pt attended and participated in relaxation group.  
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
## 2020-12-23 NOTE — BHH Group Notes (Signed)
Adult Psychoeducational Group Note  Date:  12/23/2020 Time:  1:16 PM  Group Topic/Focus:  Making Healthy Choices:   The focus of this group is to help patients identify negative/unhealthy choices they were using prior to admission and identify positive/healthier coping strategies to replace them upon discharge.  Participation Level:  Active  Participation Quality:  Appropriate  Affect:  Appropriate  Cognitive:  Appropriate  Insight: Appropriate  Engagement in Group:  Engaged  Modes of Intervention:  Discussion  Additional Comments: Patient attended Nurse Psycho-Ed. group and participated.   [name] 18/28/8337, 1:16 PM

## 2020-12-23 NOTE — Group Note (Signed)
[name] Group Therapy Note   Group Date: 12/23/2020 Start Time: 1300 End Time: 77  CSW was unable to hold group due to RN group being held during this time.   Mliss Fritz, LCSWA 12/23/2020  2:00 PM

## 2020-12-24 ENCOUNTER — Telehealth: Payer: Self-pay | Admitting: Internal Medicine

## 2020-12-24 DIAGNOSIS — F3181 Bipolar II disorder: Secondary | ICD-10-CM

## 2020-12-24 MED ORDER — LURASIDONE HCL 80 MG PO TABS: 80.0000 mg | ORAL_TABLET | Freq: Every day | ORAL | 0 refills | Status: DC

## 2020-12-24 MED ORDER — BACITRACIN ZINC 500 UNIT/GM EX OINT: TOPICAL_OINTMENT | Freq: Two times a day (BID) | CUTANEOUS | 0 refills | Status: DC

## 2020-12-24 MED ORDER — THIAMINE HCL 100 MG PO TABS
100.0000 mg | ORAL_TABLET | Freq: Every day | ORAL | 0 refills | Status: AC
Start: 2020-12-25 — End: 2021-01-24

## 2020-12-24 MED ORDER — TOPIRAMATE 25 MG PO TABS: 25.0000 mg | ORAL_TABLET | Freq: Two times a day (BID) | ORAL | 0 refills | Status: DC

## 2020-12-24 MED ORDER — MELATONIN 5 MG PO TABS: 5.0000 mg | ORAL_TABLET | Freq: Every day | ORAL | 0 refills | Status: DC

## 2020-12-24 MED ORDER — FOLIC ACID 1 MG PO TABS: 1.0000 mg | ORAL_TABLET | Freq: Every day | ORAL | 0 refills | Status: DC

## 2020-12-24 MED ORDER — HYDROXYZINE HCL 50 MG PO TABS: 50.0000 mg | ORAL_TABLET | Freq: Three times a day (TID) | ORAL | 0 refills | Status: DC | PRN

## 2020-12-24 MED ORDER — ADULT MULTIVITAMIN W/MINERALS CH: 1.0000 | ORAL_TABLET | Freq: Every day | ORAL | 0 refills | Status: DC

## 2020-12-24 MED ORDER — SULFAMETHOXAZOLE-TRIMETHOPRIM 400-80 MG PO TABS: 1.0000 | ORAL_TABLET | Freq: Two times a day (BID) | ORAL | 0 refills | Status: AC

## 2020-12-24 NOTE — Final Progress Note (Signed)
Discharge Note:  Patient denies SI/HI AVH at this time. Discharge instructions, AVS, prescriptions and transition record gone over with patient. Patient agrees to comply with medication management, follow-up visit, and outpatient therapy. Patient belongings returned to patient. Patient questions and concerns addressed and answered.  Patient ambulatory off unit.  Patient discharged to home with friend.   

## 2020-12-24 NOTE — Group Note (Signed)
Date:  12/24/2020 Time:  10:15 AM  Group Topic/Focus:  Orientation:   The focus of this group is to educate the patient on the purpose and policies of crisis stabilization and provide a format to answer questions about their admission.  The group details unit policies and expectations of patients while admitted.    Participation Level:  Minimal  Participation Quality:  Appropriate  Affect:  Appropriate  Cognitive:  Appropriate  Insight: Appropriate  Engagement in Group:  Engaged  Modes of Intervention:  Discussion  Additional Comments:  Pt plans to meet with doctor or SW for plans to go home.  Garvin Fila 12/24/2020, 10:15 AM

## 2020-12-24 NOTE — Progress Notes (Signed)
  [facility] Adult Case Management Discharge Plan :  Will you be returning to the same living situation after discharge:  Yes,  friend's home At discharge, do you have transportation home?: Yes,  friend Do you have the ability to pay for your medications: Yes,  private insurance  Release of information consent forms completed and in the chart;  Patient's signature needed at discharge.  Patient to Follow up at:  Follow-up Information     BEHAVIORAL HEALTH OUTPATIENT THERAPY [place] Follow up on 12/31/2020.   Specialty: Behavioral Health Why: You have an appointment for therapy services on 12/31/20 at 2:00 pm.  This will be a Virtual telehealth appointment. Contact information: [place] [phone] mc [place] [address] [facility] Follow up on 01/08/2021.   Why: You have an appointment for medication management services on 01/08/21 at 1:10 pm.  This will be a Virtual appointment. Contact information: [place] [address] [phone]                 Next level of care provider has access to [place] and Suicide Prevention discussed: Yes,  room mate     Has patient been referred to the Quitline?: Patient refused referral  Patient has been referred for addiction treatment: Pt. refused referral  Eliott Nine 12/24/2020, 9:46 AM

## 2020-12-24 NOTE — BHH Suicide Risk Assessment (Addendum)
[facility] Discharge Suicide Risk Assessment   Principal Problem: Bipolar 2 disorder, major depressive episode ([place]) Discharge Diagnoses: Principal Problem:   Bipolar 2 disorder, major depressive episode ([place]) Active Problems:   Borderline personality disorder ([place])   PTSD (post-traumatic stress disorder)   Folliculitis  Total Time Spent in Direct Patient Care:  I personally spent 35 minutes on the unit in direct patient care. The direct patient care time included face-to-face time with the patient, reviewing the patient's chart, communicating with other professionals, and coordinating care. Greater than 50% of this time was spent in counseling or coordinating care with the patient regarding goals of hospitalization, psycho-education, and discharge planning needs.  Subjective: Patient seen on rounds. She denies SI, HI, AVH, paranoia, ideas of reference, delusions, or first rank symptoms. She reports stable sleep and appetite and voices no physical complaints. She can articulate a safety and discharge plan of care. She was reminded she will need ongoing monitoring of her metabolic labs, EKG, CBC, AIMS, and weight while on Latuda. She was advised to have her TSH rechecked by her PCP in the next 4 weeks without fail and to have her elevated LDL cholesterol monitored by her PCP as well. She was encouraged to work on [employer] and increased exercise. She was advised to abstain from alcohol and illicit substance use after discharge. She was encouraged to comply with medications and to keep outpatient follow up appointments.   Musculoskeletal: Strength & Muscle Tone: within normal limits Gait & Station: normal Patient leans: N/A Psychiatric Specialty Exam: Physical Exam Vitals reviewed.  HENT:     Head: Normocephalic.  Pulmonary:     Effort: Pulmonary effort is normal.  Neurological:     General: No focal deficit present.     Mental Status: She is alert.    Review of Systems  Respiratory:   Negative for shortness of breath.   Cardiovascular:  Negative for chest pain.  Gastrointestinal:  Negative for diarrhea, nausea and vomiting.   BP 113/85 HR 114, T97.3, 100% sats  General Appearance:  casually dressed, adequate hygiene  Eye Contact:  Good  Speech:  Clear and Coherent and Normal Rate  Volume:  Normal  Mood:  Euthymic  Affect:  Congruent  Thought Process:  Goal Directed and Linear  Orientation:  Full (Time, Place, and Person)  Thought Content:  Logical and denies AVH, paranoia, delusions  Suicidal Thoughts:  No  Homicidal Thoughts:  No  Memory:  Recent;   Good  Judgement:  Fair  Insight:  Fair  Psychomotor Activity:  Normal, no cogwheeling, no stiffness, no tremor  Concentration:  Concentration: Good and Attention Span: Good  Recall:  Good  Fund of Knowledge:  Good  Language:  Good  Akathisia:  Negative  AIMS (if indicated):   0  Assets:  Communication Skills Desire for Improvement Resilience Social Support  ADL's:  Intact  Cognition:  WNL  Sleep:  Number of Hours: 6.5   Mental Status Per Nursing Assessment::   On Admission:  Suicidal ideation indicated by patient - resolved  Demographic Factors:  Caucasian  Loss Factors: Financial problems/change in socioeconomic status  Historical Factors: Prior self-harm behaviors,h/o prior abuse  Risk Reduction Factors:   Positive social support, Positive coping skills or problem solving skills, employed  Continued Clinical Symptoms:  More than one psychiatric diagnosis Previous Psychiatric Diagnoses and Treatments Bipolar diagnosis  Cognitive Features That Contribute To Risk:  None    Suicide Risk:  Mild:  There are no identifiable plans,  no associated intent, mild dysphoria and related symptoms, few other risk factors, and identifiable protective factors, including available and accessible social support.   Follow-up Information     BEHAVIORAL HEALTH OUTPATIENT THERAPY [place] Follow up on  12/31/2020.   Specialty: Behavioral Health Why: You have an appointment for therapy services on 12/31/20 at 2:00 pm.  This will be a Virtual telehealth appointment. Contact information: [place] [id number] mc [place] [address] [facility] Follow up on 01/08/2021.   Why: You have an appointment for medication management services on 01/08/21 at 1:10 pm.  This will be a Virtual appointment. Contact information: [place] [address] [phone]                 Plan Of Care/Follow-up recommendations:  Activity:  as tolerated Diet:  heart healthy Other:  Patient advised to keep scheduled outpatient mental health and therapy follow up appointments and to comply with medications. She was reminded she will need ongoing monitoring of her weight, EKG, CBC, lipids, glucose, and AIMS while on Latuda. She was advised that her TSH was mildly elevated and should be rechecked by her primary care doctor in the next 4 weeks without fail. She was advised that her LDL cholesterol is mildly elevated and to eat healthier, to exercise, and to have this rechecked by her primary care provider in the next 4 weeks without fail. She was encouraged to abstain from alcohol and illicit drug use after discharge. She was encouraged to complete her antibiotic for her folliculitis and to have her primary care doctor recheck this after discharge. She will need to monitor her blood pressure closely while on Prazosin.   Harlow Asa, MD, FAPA 12/24/2020, 10:09 AM

## 2020-12-24 NOTE — Discharge Summary (Signed)
Physician Discharge Summary Note  Patient:  [name] is an 35 y.o., female MRN:  [id number] DOB:  Apr 18, 1985 Patient phone:  [phone] (home)  Patient address:   [address] [place] [address],  Total Time spent with patient: 1 hour  Date of Admission:  12/19/2020 Date of Discharge: 12/24/20  Reason for Admission:  [name] is a 35 year old female with past psychiatric history significant for PTSD, MDD, bipolar 2, borderline personality disorder, self-harm behaviors, multiple psychiatric admissions initially presented to [facility] ED on 12/18/20 for intentionally overdosing on 18 pills of Zyprexa (2.5 mg each) in a suicidal attempt.  In ED she stated that she was under a lot of stress and does not want to live anymore.  Patient was assessed by psychiatry and recommended for inpatient psychiatric admission.  Patient was transferred to [facility] on 12/19/2020.  Principal Problem: Bipolar 2 disorder, major depressive episode [facility]) Discharge Diagnoses: Principal Problem:   Bipolar 2 disorder, major depressive episode ([place]) Active Problems:   Borderline personality disorder ([place])   PTSD (post-traumatic stress disorder)   Folliculitis   Past Psychiatric History: MDD, PTSD, borderline personality, recently diagnosed bipolar 2 , H/o self-harm behaviors. Multiple psychiatric admissions most recent at [facility] 04/25/2020 also at [facility] per patient. (Records not available) Feeling depressed since third grade.  In and out of the hospital since ninth grade. Patient tried multiple medication in the past-Prozac(stopped due to bipolar 2 diagnosis), Invega, Seroquel (sedation), Depakote(rash), and Lamictal.  Per patient nothing worked Patient has been getting therapy with same therapist on and off since sixth grade.  She was getting therapy once every 2 weeks.  Stopped due to being not able to afford. Follows Dr. Darleene Cleaver for about 5 years. Most recent medications Zyprexa 10 mg daily  started 1 month ago.  Has been taking gabapentin 600 3 times daily for last 6 to 7 years.  Vistaril for anxiety, prazosin 1 mg in the morning and 3 mg before bedtime  Past Medical History:  Past Medical History:  Diagnosis Date   Allergy    Anxiety    Asthma    Bipolar 2 disorder ([place])    Borderline personality disorder ([place])    Depression    Eczema    Former smoker    Obesity    PTSD (post-traumatic stress disorder)    Urticaria     Past Surgical History:  Procedure Laterality Date   Thumb surgery Left    Family History:  Family History  Problem Relation Age of Onset   Healthy Mother    Healthy Father    Alzheimer's disease Maternal Grandmother    Skin cancer Paternal Grandmother    Allergic rhinitis Paternal Grandmother    Asthma Paternal Grandmother    Angioedema Neg Hx    Eczema Neg Hx    Urticaria Neg Hx    Family Psychiatric  History: Unknown Social History:  Social History   Substance and Sexual Activity  Alcohol Use Yes   Alcohol/week: 8.0 standard drinks   Types: 8 Cans of beer per week   Comment: occ     Social History   Substance and Sexual Activity  Drug Use Yes   Frequency: 4.0 times per week   Types: Marijuana   Comment: Last smoked this morning-03/09/19    Social History   Socioeconomic History   Marital status: Single    Spouse name: Not on file   Number of children: 0   Years of education: 37  Highest education level: Not on file  Occupational History   Not on file  Tobacco Use   Smoking status: Former    Packs/day: 0.50    Years: 9.00    Pack years: 4.50    Types: Cigarettes    Quit date: 12/04/2015    Years since quitting: 5.0   Smokeless tobacco: Never  Vaping Use   Vaping Use: Former  Substance and Sexual Activity   Alcohol use: Yes    Alcohol/week: 8.0 standard drinks    Types: 8 Cans of beer per week    Comment: occ   Drug use: Yes    Frequency: 4.0 times per week    Types: Marijuana    Comment: Last smoked this  morning-03/09/19   Sexual activity: Yes    Birth control/protection: Pill  Other Topics Concern   Not on file  Social History Narrative   Fun: Play video games and watch movies.   Denies abuse and feels safe at home.   Social Determinants of Health   Financial Resource Strain: Not on file  Food Insecurity: Not on file  Transportation Needs: Not on file  Physical Activity: Not on file  Stress: Not on file  Social Connections: Not on file    Hospital Course:  On admission patient was integrated into therapeutic milieu and placed on appropriate precautions.  Zyprexa was stopped and pt was started on Latuda titrated to 80 mg daily with supper and Topamax 25 mg twice daily for impulsivity, melatonin 5 mg daily at bedtime for sleep.  Her home Neurontin 600 mg 3 times daily and prazosin 1 mg AM and 3 mg nightly were continued.  She was put on CIWA protocol with as needed Ativan for alcohol withdrawal with multivitamin, folic acid, thiamine and as needed medication for withdrawal symptoms control.  Patient reported infected ingrown hair on knee.  Medicine was consulted and recommended TMP-SMX 400-80 for 7 days (to be finished on 11/25).  Her TSH was elevated to 4.8 with normal T3 3.3 and T4 0.97.  Patient was recommended to follow-up with PCP to repeat thyroid labs in 4 weeks. Pt's mood, sleep, affect and anxiety improved while her time at the inpatient unit. Pt was seen daily on rounds, and case discussed at multidisciplinary team meeting to ensure biopsychosocial approach to treatment. Pt was educated on need for compliance with his medications. Pt was also educated on need to abstain from use of any drugs. During her stay Patient did not display any dangerous, violent or suicidal behavior on the unit.  Pt interacted with patients & staff appropriately, participated appropriately in the group sessions/therapies. Pt's medications were addressed & adjusted to meet her needs. Pt was recommended for  outpatient follow-up care & medication management upon discharge to assure her continuity of care.   At the time of discharge, patient is not reporting any acute suicidal/homicidal ideations. Pt reports improved mood, sleep and anxiety. Pt currently denies any new issues or concerns. Education and supportive counseling provided throughout her hospital stay & upon discharge. Patient was recommended to abstain from illicit drug and alcohol use.  Patient was encouraged to keep scheduled outpatient mental health follow up appointments and to comply with medications.  Patient  is advised that she will need ongoing monitoring of her lipids, glucose, CBC, AIMS, weight, and EKG while on antipsychotics . She was advised to see a primary care provider or the health department without fail to repeat thyroid labs in 4 weeks.  Physical Findings:  AIMS: Facial and Oral Movements Muscles of Facial Expression: None, normal Lips and Perioral Area: None, normal Jaw: None, normal Tongue: None, normal,Extremity Movements Upper (arms, wrists, hands, fingers): None, normal Lower (legs, knees, ankles, toes): None, normal, Trunk Movements Neck, shoulders, hips: None, normal, Overall Severity Severity of abnormal movements (highest score from questions above): None, normal Incapacitation due to abnormal movements: None, normal Patient's awareness of abnormal movements (rate only patient's report): No Awareness, Dental Status Current problems with teeth and/or dentures?: No Does patient usually wear dentures?: No  CIWA:  CIWA-Ar Total: 0 COWS:     Musculoskeletal: Strength & Muscle Tone: within normal limits Gait & Station: normal Patient leans: N/A   Psychiatric Specialty Exam:  Presentation  General Appearance: Appropriate for Environment; Casual  Eye Contact:Good  Speech:Clear and Coherent  Speech Volume:Normal  Handedness:Right   Mood and Affect  Mood:Euthymic  Affect:Congruent   Thought  Process  Thought Processes:Coherent; Goal Directed  Descriptions of Associations:Intact  Orientation:Full (Time, Place and Person)  Thought Content:Logical; WDL  History of Schizophrenia/Schizoaffective disorder:No  Duration of Psychotic Symptoms:N/A  Hallucinations:Hallucinations: None  Ideas of Reference:None  Suicidal Thoughts:Suicidal Thoughts: No SI Passive Intent and/or Plan: Without Intent  Homicidal Thoughts:Homicidal Thoughts: No   Sensorium  Memory:Immediate Fair; Recent Fair  Judgment:Poor  Insight:Fair   Executive Functions  Concentration:Fair  Attention Span:Fair  [place] of Knowledge:Good  Language:Good   Psychomotor Activity  Psychomotor Activity:Psychomotor Activity: Normal   Assets  Assets:Communication Skills; Desire for Improvement; Resilience   Sleep  Sleep:Sleep: Good Number of Hours of Sleep: 6.5    Physical Exam: Physical Exam Vitals and nursing note reviewed.  Constitutional:      General: She is not in acute distress.    Appearance: Normal appearance. She is not ill-appearing, toxic-appearing or diaphoretic.  HENT:     Head: Normocephalic and atraumatic.  Pulmonary:     Effort: Pulmonary effort is normal.  Neurological:     General: No focal deficit present.     Mental Status: She is alert and oriented to person, place, and time.   Review of Systems  Constitutional:  Negative for chills and fever.  HENT:  Negative for hearing loss.   Eyes:  Negative for blurred vision.  Respiratory:  Negative for cough and shortness of breath.   Cardiovascular:  Negative for chest pain.  Gastrointestinal:  Negative for abdominal pain, diarrhea, nausea and vomiting.  Neurological:  Negative for dizziness and headaches.  Psychiatric/Behavioral:  Negative for depression, hallucinations and suicidal ideas. The patient is nervous/anxious.   Blood pressure 113/82, pulse (!) 132, temperature (!) 97.3 F (36.3 C), temperature  source Oral, resp. rate 20, height 5\' 2"  (1.575 m), weight 100.8 kg, SpO2 100 %. Body mass index is 40.64 kg/m.   Social History   Tobacco Use  Smoking Status Former   Packs/day: 0.50   Years: 9.00   Pack years: 4.50   Types: Cigarettes   Quit date: 12/04/2015   Years since quitting: 5.0  Smokeless Tobacco Never   Tobacco Cessation:  N/A, patient does not currently use tobacco products   Blood Alcohol level:  Lab Results  Component Value Date   ETH 176 (H) 12/18/2020   ETH  11/20/2009    <5        LOWEST DETECTABLE LIMIT FOR SERUM ALCOHOL IS 5 mg/dL FOR MEDICAL PURPOSES ONLY    Metabolic Disorder Labs:  Lab Results  Component Value Date   HGBA1C 5.2 12/20/2020   MPG 102.54  12/20/2020   No results found for: PROLACTIN Lab Results  Component Value Date   CHOL 202 (H) 12/20/2020   TRIG 103 12/20/2020   HDL 54 12/20/2020   CHOLHDL 3.7 12/20/2020   VLDL 21 12/20/2020   LDLCALC 127 (H) 12/20/2020   LDLCALC 100 (H) 07/10/2020    See Psychiatric Specialty Exam and Suicide Risk Assessment completed by Attending Physician prior to discharge.  Discharge destination:  Home  Is patient on multiple antipsychotic therapies at discharge:  No   Has Patient had three or more failed trials of antipsychotic monotherapy by history:  No  Recommended Plan for Multiple Antipsychotic Therapies: NA  Discharge Instructions     Diet - low sodium heart healthy   Complete by: As directed    Increase activity slowly   Complete by: As directed       Allergies as of 12/24/2020       Reactions   Benztropine Other (See Comments)   Report hypersensitivity Report hypersensitivity   Other    Opiates- history of dependency    Lamictal [lamotrigine] Rash        Medication List     STOP taking these medications    fluconazole 150 MG tablet Commonly known as: DIFLUCAN   FLUoxetine 40 MG capsule Commonly known as: PROZAC   hydrocortisone 25 MG suppository Commonly  known as: Anusol-HC   hydrOXYzine 25 MG capsule Commonly known as: VISTARIL   ibuprofen 600 MG tablet Commonly known as: ADVIL   mupirocin cream 2 % Commonly known as: Bactroban   OLANZapine 10 MG tablet Commonly known as: ZYPREXA   potassium chloride SA 20 MEQ tablet Commonly known as: KLOR-CON   triamcinolone cream 0.1 % Commonly known as: KENALOG   vitamin B-12 1000 MCG tablet Commonly known as: CYANOCOBALAMIN       TAKE these medications      Indication  albuterol (2.5 MG/3ML) 0.083% nebulizer solution Commonly known as: PROVENTIL Take 3 mLs (2.5 mg total) by nebulization every 6 (six) hours as needed for wheezing or shortness of breath.  Indication: asthma   bacitracin ointment Apply topically 2 (two) times daily.  Indication: skin infection   bismuth subsalicylate 400 QQ/76PP suspension Commonly known as: PEPTO BISMOL Take 30 mLs by mouth every 6 (six) hours as needed for indigestion or diarrhea or loose stools.  Indication: Indigestion   budesonide-formoterol 160-4.5 MCG/ACT inhaler Commonly known as: Symbicort Inhale 2 puffs into the lungs 2 (two) times daily.  Indication: Asthma   fluticasone 50 MCG/ACT nasal spray Commonly known as: FLONASE Place 2 sprays into both nostrils daily. What changed: when to take this  Indication: Allergic Rhinitis   folic acid 1 MG tablet Commonly known as: FOLVITE Take 1 tablet (1 mg total) by mouth daily. Start taking on: December 25, 2020  Indication: alcohol withdrawl   gabapentin 600 MG tablet Commonly known as: NEURONTIN Take 600 mg by mouth 3 (three) times daily.  Indication: anxiety   hydrOXYzine 50 MG tablet Commonly known as: ATARAX/VISTARIL Take 1 tablet (50 mg total) by mouth 3 (three) times daily as needed for anxiety. What changed:  when to take this reasons to take this additional instructions  Indication: Feeling Anxious   levonorgestrel-ethinyl estradiol 0.15-30 MG-MCG tablet Commonly  known as: NORDETTE Take 0.15 mg by mouth daily.  Indication: Birth Control Treatment   lurasidone 80 MG Tabs tablet Commonly known as: LATUDA Take 1 tablet (80 mg total) by mouth daily with supper.  Indication: Depressive Phase  of Manic-Depression   melatonin 5 MG Tabs Take 1 tablet (5 mg total) by mouth at bedtime.  Indication: Trouble Sleeping   montelukast 10 MG tablet Commonly known as: SINGULAIR Take 1 tablet (10 mg total) by mouth at bedtime.  Indication: Asthma   multivitamin with minerals Tabs tablet Take 1 tablet by mouth daily. Start taking on: December 25, 2020  Indication: Tiredness   omeprazole 40 MG capsule Commonly known as: PRILOSEC Take 40 mg by mouth 2 (two) times daily. What changed: Another medication with the same name was removed. Continue taking this medication, and follow the directions you see here.  Indication: Heartburn   prazosin 1 MG capsule Commonly known as: MINIPRESS Take 1-3 mg by mouth See admin instructions. Take 1mg  in the morning and 3mg   before bedtime.  Indication: Frightening Dreams   sulfamethoxazole-trimethoprim 400-80 MG tablet Commonly known as: BACTRIM Take 1 tablet by mouth every 12 (twelve) hours for 7 doses.  Indication: ingrown hair   thiamine 100 MG tablet Take 1 tablet (100 mg total) by mouth daily. Start taking on: December 25, 2020  Indication: alcohol withdrawl   topiramate 25 MG tablet Commonly known as: TOPAMAX Take 1 tablet (25 mg total) by mouth 2 (two) times daily.  Indication: Mood symptoms        Follow-up Information     BEHAVIORAL HEALTH OUTPATIENT THERAPY [place] Follow up on 12/31/2020.   Specialty: Behavioral Health Why: You have an appointment for therapy services on 12/31/20 at 2:00 pm.  This will be a Virtual telehealth appointment. Contact information: [place] [id number] mc [place] [address] [facility] Follow up on  01/08/2021.   Why: You have an appointment for medication management services on 01/08/21 at 1:10 pm.  This will be a Virtual appointment. Contact information: [place] [address] [phone]                 Follow-up recommendations:  Activity:  As Tolerated Diet:  Low sodium Heart Healthy Patient  is advised that she will need ongoing monitoring of her lipids, glucose, CBC, AIMS, weight, and EKG while on antipsychotics .  Advised to see a primary care provider or the health department without fail to repeat thyroid labs in 4 weeks.  Comments:  Prescriptions given at discharge. Patient agreeable to plan.  Given opportunity to ask questions.  Appears to feel comfortable with discharge denies any current suicidal or homicidal thought. Patient is also instructed prior to discharge to: Take all medications as prescribed by his/her mental healthcare provider. Report any adverse effects and or reactions from the medicines to her outpatient provider promptly. Patient has been instructed & cautioned: To not engage in alcohol and or illegal drug use while on prescription medicines. In the event of worsening symptoms, patient is instructed to call the crisis hotline, 911 and or go to the nearest ED for appropriate evaluation and treatment of symptoms. To follow-up with her primary care provider for your other medical issues, concerns and or health care needs.   Signed: Armando Reichert, MD PGY2 12/24/2020, 3:23 PM

## 2020-12-24 NOTE — Telephone Encounter (Signed)
Patient calling to request appt for today 12-24-2020 or tomorrow 12-25-2020  Patient states she sprang her wrist at work  Advised patient there was no ov at any [facility] locations today and only same day appts for tomorrow  Patient states she will call tomorrow to schedule a same day appt

## 2020-12-24 NOTE — Progress Notes (Signed)
   12/24/20 0758  Psych Admission Type (Psych Patients Only)  Admission Status Voluntary  Psychosocial Assessment  Patient Complaints Anxiety;Depression  Eye Contact Fair  Facial Expression Anxious  Affect Anxious;Appropriate to circumstance  Speech Logical/coherent  Interaction Assertive;Attention-seeking;Needy  Motor Activity Slow  Appearance/Hygiene Excess makeup  Behavior Characteristics Cooperative;Anxious  Mood Depressed;Anxious  Thought Process  Coherency Concrete thinking  Content WDL  Delusions None reported or observed  Perception WDL  Hallucination None reported or observed  Judgment Poor  Confusion None  Danger to Self  Current suicidal ideation? Denies  Self-Injurious Behavior No self-injurious ideation or behavior indicators observed or expressed   Danger to Others  Danger to Others None reported or observed   D: Patient denies SI/HI/AVH. Patient rated  both anxiety depression 3/10. Pt. Reported that she was more excited to go home. Pt. Was out in open areas and was social with peers and staff.  A:  Patient took scheduled medicine.  Support and encouragement provided Routine safety checks conducted every 15 minutes. Patient  Informed to notify staff with any concerns.   R:Safety maintained.

## 2020-12-24 NOTE — Telephone Encounter (Signed)
Patient should be seen at the ED or Urgent Care for sprain wrist

## 2020-12-31 ENCOUNTER — Other Ambulatory Visit: Payer: Self-pay

## 2020-12-31 ENCOUNTER — Ambulatory Visit (INDEPENDENT_AMBULATORY_CARE_PROVIDER_SITE_OTHER): Payer: 59 | Admitting: Clinical

## 2020-12-31 DIAGNOSIS — F419 Anxiety disorder, unspecified: Secondary | ICD-10-CM

## 2020-12-31 DIAGNOSIS — F4310 Post-traumatic stress disorder, unspecified: Secondary | ICD-10-CM

## 2020-12-31 DIAGNOSIS — F3181 Bipolar II disorder: Secondary | ICD-10-CM | POA: Diagnosis not present
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
## 2020-12-31 NOTE — Plan of Care (Signed)
Pt will develop healthy coping methods to manage emotions as evidenced by engaging in physical activity/exercise 2 out of 7 days per week; journaling 1 out of 7 days per week. Pt became anxious during completion of tx plan and unable to identify amount of time she will devote goals. Pt participated in completion of tx plan.

## 2021-01-01 ENCOUNTER — Telehealth (HOSPITAL_COMMUNITY): Payer: Self-pay | Admitting: Professional

## 2021-01-31 ENCOUNTER — Telehealth (HOSPITAL_COMMUNITY): Payer: Self-pay | Admitting: Clinical

## 2021-01-31 NOTE — Telephone Encounter (Signed)
Rosario Adie, [name] with [place] left voicemail at the [facility] requesting to have [name] contact her regarding [name] (a high risk pt) to discuss her treatment. Writer received message and sending encounter to provider to respond to at earliest convenience. Dawn requested a callback from [name] with [name]'s demographic information and her credentials. She can be reached at [phone].

## 2021-02-04 ENCOUNTER — Telehealth (HOSPITAL_COMMUNITY): Payer: Self-pay | Admitting: Professional

## 2021-02-12 ENCOUNTER — Encounter: Payer: Self-pay | Admitting: Internal Medicine

## 2021-02-12 ENCOUNTER — Telehealth (INDEPENDENT_AMBULATORY_CARE_PROVIDER_SITE_OTHER): Payer: 59 | Admitting: Internal Medicine

## 2021-02-12 ENCOUNTER — Other Ambulatory Visit: Payer: Self-pay

## 2021-02-12 ENCOUNTER — Other Ambulatory Visit: Payer: Self-pay | Admitting: Allergy & Immunology

## 2021-02-12 ENCOUNTER — Telehealth: Payer: Self-pay | Admitting: Allergy & Immunology

## 2021-02-12 DIAGNOSIS — E559 Vitamin D deficiency, unspecified: Secondary | ICD-10-CM | POA: Diagnosis not present

## 2021-02-12 DIAGNOSIS — J4540 Moderate persistent asthma, uncomplicated: Secondary | ICD-10-CM

## 2021-02-12 DIAGNOSIS — L0201 Cutaneous abscess of face: Secondary | ICD-10-CM

## 2021-02-12 MED ORDER — OMEPRAZOLE 40 MG PO CPDR: 40.0000 mg | DELAYED_RELEASE_CAPSULE | Freq: Two times a day (BID) | ORAL | 0 refills | Status: DC

## 2021-02-12 MED ORDER — DOXYCYCLINE HYCLATE 100 MG PO TABS: 100.0000 mg | ORAL_TABLET | Freq: Two times a day (BID) | ORAL | 0 refills | Status: DC

## 2021-02-12 MED ORDER — MONTELUKAST SODIUM 10 MG PO TABS: 10.0000 mg | ORAL_TABLET | Freq: Every day | ORAL | 0 refills | Status: DC

## 2021-02-12 NOTE — Telephone Encounter (Signed)
Called however could not leave a message as patient is unavailable at this time.

## 2021-02-12 NOTE — Assessment & Plan Note (Signed)
Small without fever and only slight drainage, non toxic, ok for doxy course, consider I&D if worsens

## 2021-02-12 NOTE — Progress Notes (Signed)
Patient ID: GENEVRA ORNE, female   DOB: August 03, 1985, 36 y.o.   MRN: [id number]  Virtual Visit via Video Note  I connected with [name] on 02/12/21 at 10:40 AM EST by a video enabled telemedicine application and verified that I am speaking with the correct person using two identifiers.  Location of all participants today Patient:  at home Provider: at office   I discussed the limitations of evaluation and management by telemedicine and the availability of in person appointments. The patient expressed understanding and agreed to proceed.  History of Present Illness: Here with acute onset 3 days worsneing right mid cheek area red, tender, sweling and slight d/c after picking at the skin with the fingers related to her psychiatric d/o.  No fever, but when started more pain and drainage, decided to call.  Similar episode with lesions to legs treated with doxy course in June 2022 and responded well.  Pt denies chest pain, increased sob or doe, wheezing, orthopnea, PND, increased LE swelling, palpitations, dizziness or syncope.   Pt denies polydipsia, polyuria, or new focal neuro s/s.  Pt denies fever, wt loss, night sweats, loss of appetite, or other constitutional symptoms   Denies worsening depressive symptoms, suicidal ideation, or panic; has ongoing anxiety.   Past Medical History:  Diagnosis Date   Allergy    Anxiety    Asthma    Bipolar 2 disorder ([place])    Borderline personality disorder ([place])    Depression    Eczema    Former smoker    Obesity    PTSD (post-traumatic stress disorder)    Urticaria    Past Surgical History:  Procedure Laterality Date   Thumb surgery Left     reports that she quit smoking about 5 years ago. Her smoking use included cigarettes. She has a 4.50 pack-year smoking history. She has never used smokeless tobacco. She reports current alcohol use of about 8.0 standard drinks per week. She reports current drug use. Frequency: 4.00 times per week. Drug:  Marijuana. family history includes Allergic rhinitis in her paternal grandmother; Alzheimer's disease in her maternal grandmother; Asthma in her paternal grandmother; Healthy in her father and mother; Skin cancer in her paternal grandmother. Allergies  Allergen Reactions   Benztropine Other (See Comments)    Report hypersensitivity Report hypersensitivity    Other     Opiates- history of dependency    Lamictal [Lamotrigine] Rash   Current Outpatient Medications on File Prior to Visit  Medication Sig Dispense Refill   albuterol (PROVENTIL) (2.5 MG/3ML) 0.083% nebulizer solution Take 3 mLs (2.5 mg total) by nebulization every 6 (six) hours as needed for wheezing or shortness of breath. 150 mL 1   bacitracin ointment Apply topically 2 (two) times daily. 120 g 0   bismuth subsalicylate (PEPTO BISMOL) 262 MG/15ML suspension Take 30 mLs by mouth every 6 (six) hours as needed for indigestion or diarrhea or loose stools.     budesonide-formoterol (SYMBICORT) 160-4.5 MCG/ACT inhaler Inhale 2 puffs into the lungs 2 (two) times daily. 10.2 g 5   fluticasone (FLONASE) 50 MCG/ACT nasal spray Place 2 sprays into both nostrils daily. (Patient taking differently: Place 2 sprays into both nostrils at bedtime.) 16 g 5   folic acid (FOLVITE) 1 MG tablet Take 1 tablet (1 mg total) by mouth daily. 30 tablet 0   gabapentin (NEURONTIN) 600 MG tablet Take 600 mg by mouth 3 (three) times daily.     hydrOXYzine (ATARAX/VISTARIL) 50 MG tablet Take  1 tablet (50 mg total) by mouth 3 (three) times daily as needed for anxiety. 30 tablet 0   levonorgestrel-ethinyl estradiol (NORDETTE) 0.15-30 MG-MCG tablet Take 0.15 mg by mouth daily.     lurasidone (LATUDA) 80 MG TABS tablet Take 1 tablet (80 mg total) by mouth daily with supper. 30 tablet 0   melatonin 5 MG TABS Take 1 tablet (5 mg total) by mouth at bedtime. 30 tablet 0   Multiple Vitamin (MULTIVITAMIN WITH MINERALS) TABS tablet Take 1 tablet by mouth daily. 30 tablet 0    prazosin (MINIPRESS) 1 MG capsule Take 1-3 mg by mouth See admin instructions. Take 1mg  in the morning and 3mg   before bedtime.     topiramate (TOPAMAX) 25 MG tablet Take 1 tablet (25 mg total) by mouth 2 (two) times daily. 60 tablet 0   No current facility-administered medications on file prior to visit.    Observations/Objective: Alert, NAD, appropriate mood and affect, resps normal, cn 2-12 intact, moves all 4s, no visible rash or swelling except for right mid cheek just over 1 cm area red, tender swelling with slight d/c Lab Results  Component Value Date   WBC 8.8 12/18/2020   HGB 12.0 12/18/2020   HCT 38.1 12/18/2020   PLT 299 12/18/2020   GLUCOSE 106 (H) 12/18/2020   CHOL 202 (H) 12/20/2020   TRIG 103 12/20/2020   HDL 54 12/20/2020   LDLCALC 127 (H) 12/20/2020   ALT 12 12/18/2020   AST 18 12/18/2020   NA 141 12/18/2020   K 3.6 12/18/2020   CL 114 (H) 12/18/2020   CREATININE 0.71 12/18/2020   BUN 10 12/18/2020   CO2 20 (L) 12/18/2020   TSH 4.865 (H) 12/20/2020   HGBA1C 5.2 12/20/2020   Assessment and Plan: See notes  Follow Up Instructions: See notes   I discussed the assessment and treatment plan with the patient. The patient was provided an opportunity to ask questions and all were answered. The patient agreed with the plan and demonstrated an understanding of the instructions.   The patient was advised to call back or seek an in-person evaluation if the symptoms worsen or if the condition fails to improve as anticipated.  Cathlean Cower, MD

## 2021-02-12 NOTE — Assessment & Plan Note (Signed)
Stable overall, cont same tx inhaler

## 2021-02-12 NOTE — Telephone Encounter (Signed)
[name] called in and made an appointment for 03/13/20 and would like refills of Montelukast and Omeprazole sent in to [facility].

## 2021-02-12 NOTE — Patient Instructions (Signed)
Please take all new medication as prescribed  Please take OTC Vitamin D3 at 2000 units per day, indefinitely

## 2021-02-12 NOTE — Assessment & Plan Note (Signed)
Last vitamin D Lab Results  Component Value Date   VD25OH 31.02 07/10/2020   Low, reminded to start oral replacement

## 2021-02-13 ENCOUNTER — Encounter: Payer: Self-pay | Admitting: *Deleted

## 2021-02-13 NOTE — Telephone Encounter (Signed)
Attempted to call patient but call could not be completed as dialed. MyChart message sent to patient.

## 2021-02-16 ENCOUNTER — Encounter: Payer: Self-pay | Admitting: Internal Medicine

## 2021-02-17 ENCOUNTER — Telehealth (HOSPITAL_COMMUNITY): Payer: Self-pay | Admitting: Psychiatry

## 2021-02-17 ENCOUNTER — Telehealth (HOSPITAL_COMMUNITY): Payer: Self-pay | Admitting: Licensed Clinical Social Worker

## 2021-02-17 MED ORDER — FLUCONAZOLE 150 MG PO TABS: ORAL_TABLET | ORAL | 1 refills | Status: DC

## 2021-02-17 NOTE — Telephone Encounter (Signed)
D:  Lorin Glass, [name] referred pt to MH-IOP.  According to Sonia Baller, pt states she can only attend group three days a week, therefore she declined PHP.  A:  Placed call and oriented pt.  Encouraged pt to verify her benefits.  Pt requesting to start on 02-25-21 @ 9 a.m. R:  Patient receptive.

## 2021-02-25 ENCOUNTER — Other Ambulatory Visit: Payer: Self-pay

## 2021-02-25 ENCOUNTER — Other Ambulatory Visit (HOSPITAL_COMMUNITY): Payer: 59 | Attending: Psychiatry | Admitting: Licensed Clinical Social Worker

## 2021-02-25 ENCOUNTER — Encounter (HOSPITAL_COMMUNITY): Payer: Self-pay | Admitting: Psychiatry

## 2021-02-25 DIAGNOSIS — F16983 Hallucinogen use, unspecified with hallucinogen persisting perception disorder (flashbacks): Secondary | ICD-10-CM | POA: Diagnosis not present

## 2021-02-25 DIAGNOSIS — G479 Sleep disorder, unspecified: Secondary | ICD-10-CM | POA: Insufficient documentation

## 2021-02-25 DIAGNOSIS — F3181 Bipolar II disorder: Secondary | ICD-10-CM | POA: Insufficient documentation

## 2021-02-25 DIAGNOSIS — F515 Nightmare disorder: Secondary | ICD-10-CM | POA: Insufficient documentation

## 2021-02-25 DIAGNOSIS — Z79899 Other long term (current) drug therapy: Secondary | ICD-10-CM | POA: Diagnosis not present

## 2021-02-25 DIAGNOSIS — F4310 Post-traumatic stress disorder, unspecified: Secondary | ICD-10-CM | POA: Diagnosis not present

## 2021-02-25 DIAGNOSIS — Z62810 Personal history of physical and sexual abuse in childhood: Secondary | ICD-10-CM | POA: Diagnosis not present

## 2021-02-25 DIAGNOSIS — F429 Obsessive-compulsive disorder, unspecified: Secondary | ICD-10-CM | POA: Insufficient documentation

## 2021-02-25 DIAGNOSIS — Z9151 Personal history of suicidal behavior: Secondary | ICD-10-CM | POA: Insufficient documentation

## 2021-02-25 DIAGNOSIS — Z87891 Personal history of nicotine dependence: Secondary | ICD-10-CM | POA: Insufficient documentation

## 2021-02-25 DIAGNOSIS — Z5900 Homelessness unspecified: Secondary | ICD-10-CM | POA: Insufficient documentation
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
## 2021-02-26 ENCOUNTER — Other Ambulatory Visit: Payer: Self-pay

## 2021-02-26 ENCOUNTER — Other Ambulatory Visit (HOSPITAL_COMMUNITY): Payer: 59 | Admitting: Family

## 2021-02-26 DIAGNOSIS — F4310 Post-traumatic stress disorder, unspecified: Secondary | ICD-10-CM

## 2021-02-26 DIAGNOSIS — F3181 Bipolar II disorder: Secondary | ICD-10-CM | POA: Diagnosis not present
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
## 2021-02-27 ENCOUNTER — Other Ambulatory Visit (HOSPITAL_COMMUNITY): Payer: 59 | Admitting: Psychiatry

## 2021-02-27 ENCOUNTER — Telehealth (INDEPENDENT_AMBULATORY_CARE_PROVIDER_SITE_OTHER): Payer: Self-pay | Admitting: Internal Medicine

## 2021-02-27 ENCOUNTER — Encounter: Payer: Self-pay | Admitting: Internal Medicine

## 2021-02-27 DIAGNOSIS — F3181 Bipolar II disorder: Secondary | ICD-10-CM

## 2021-02-27 DIAGNOSIS — F4310 Post-traumatic stress disorder, unspecified: Secondary | ICD-10-CM

## 2021-02-27 DIAGNOSIS — K602 Anal fissure, unspecified: Secondary | ICD-10-CM | POA: Insufficient documentation

## 2021-02-27 DIAGNOSIS — K625 Hemorrhage of anus and rectum: Secondary | ICD-10-CM

## 2021-02-27 DIAGNOSIS — L0201 Cutaneous abscess of face: Secondary | ICD-10-CM

## 2021-02-27 IMAGING — CT CT ABD-PELV W/ CM
2 of 4 series · 16 of 46 positions shown, 18 images · IV contrast (Omnipaque)
Comparison: 10/13/2016

CLINICAL DATA: Blood in stool for 1 day

EXAM:
CT ABDOMEN AND PELVIS WITH CONTRAST
TECHNIQUE: Multidetector CT imaging of the abdomen and pelvis was performed
using the standard protocol following bolus administration of
intravenous contrast.
CONTRAST:  100mL OMNIPAQUE IOHEXOL 300 MG/ML  SOLN

[Series 3: axial st · axial · 0.91mm/px · z∈[-704,-244]mm · 13 of 100 slices shown, 15 images]
[im 4/100  soft-tissue]
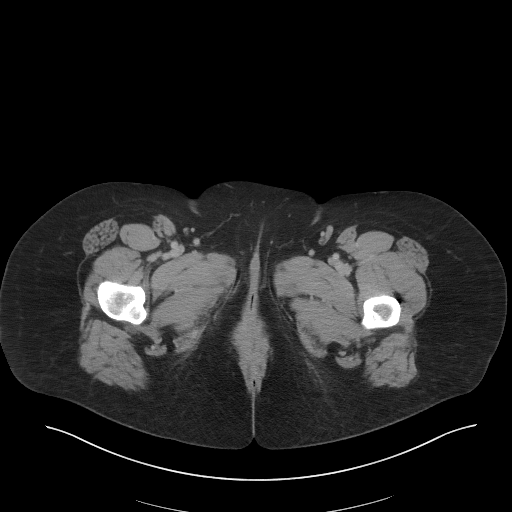
[im 4/100  bone]
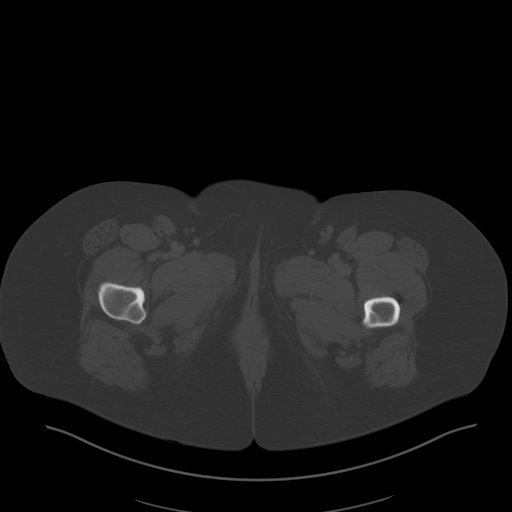
[im 12/100  soft-tissue]
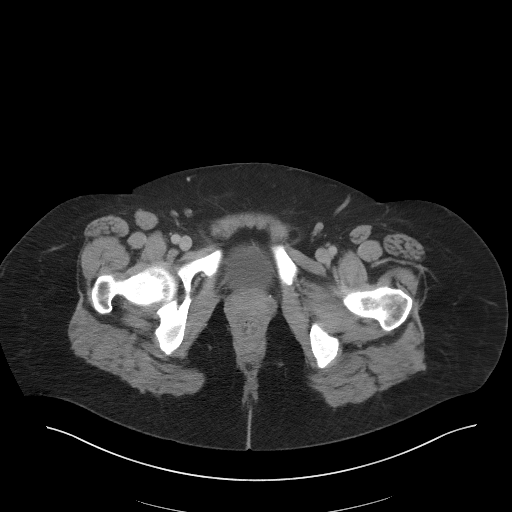
[im 20/100  soft-tissue]
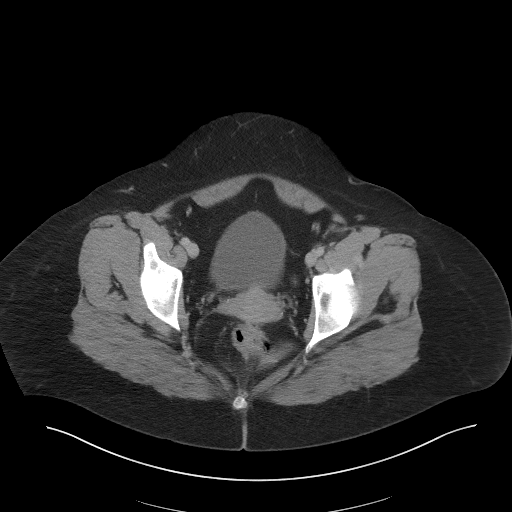
[im 28/100  soft-tissue]
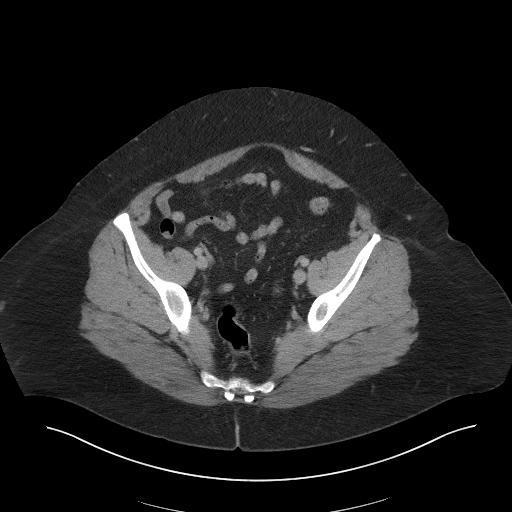
[im 36/100  soft-tissue]
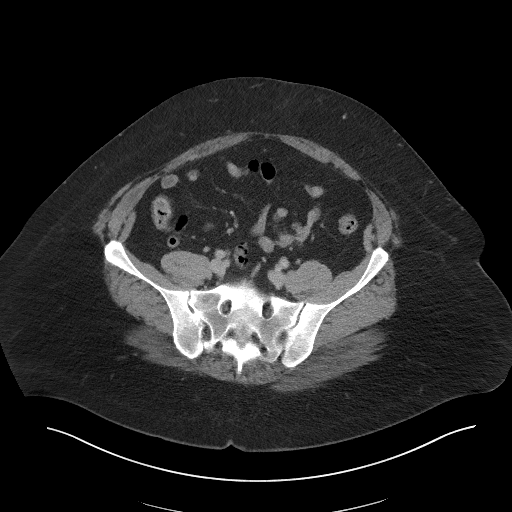
[im 44/100  soft-tissue]
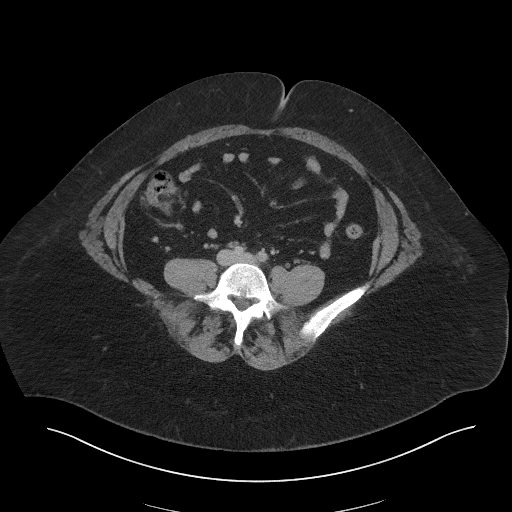
[im 52/100  soft-tissue]
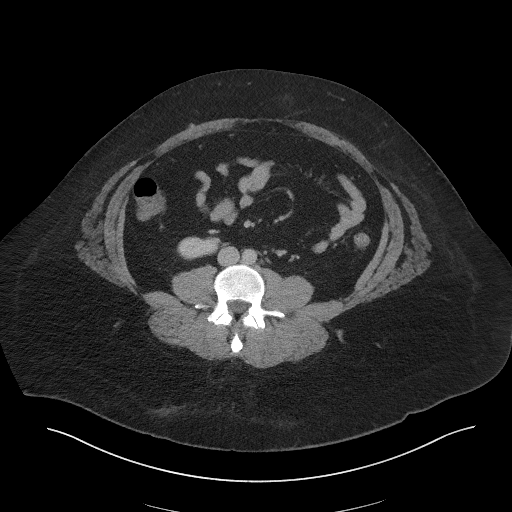
[im 56/100  soft-tissue]
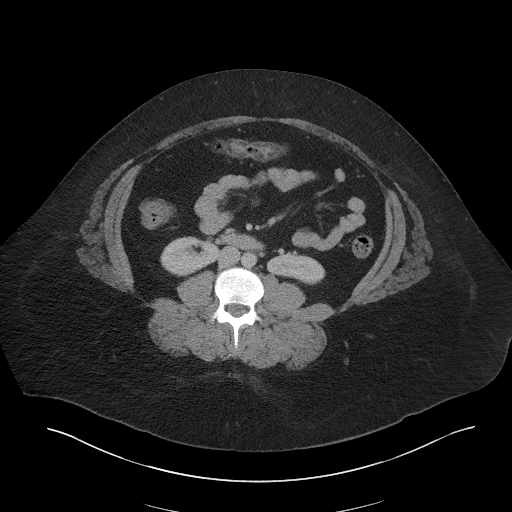
[im 64/100  soft-tissue]
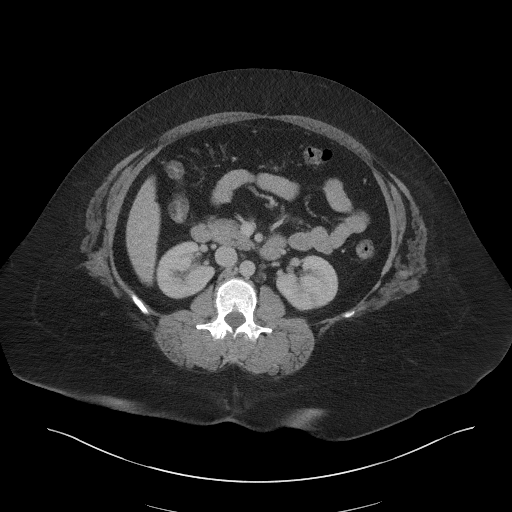
[im 64/100  bone]
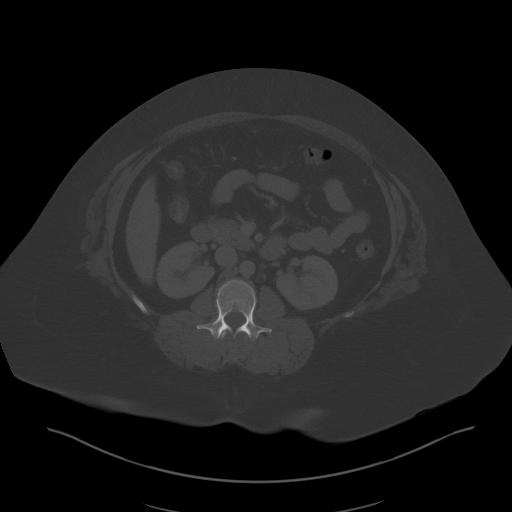
[im 72/100  soft-tissue]
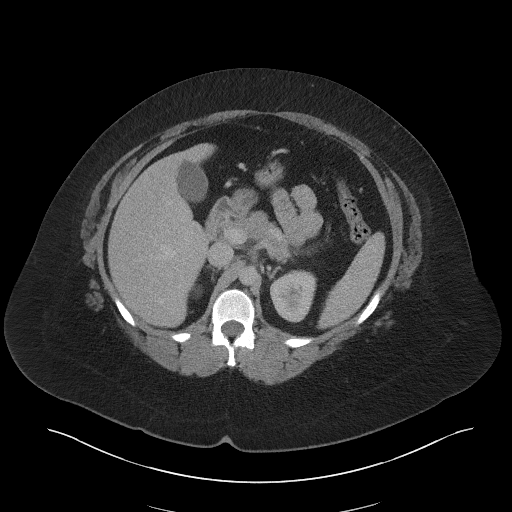
[im 80/100  soft-tissue]
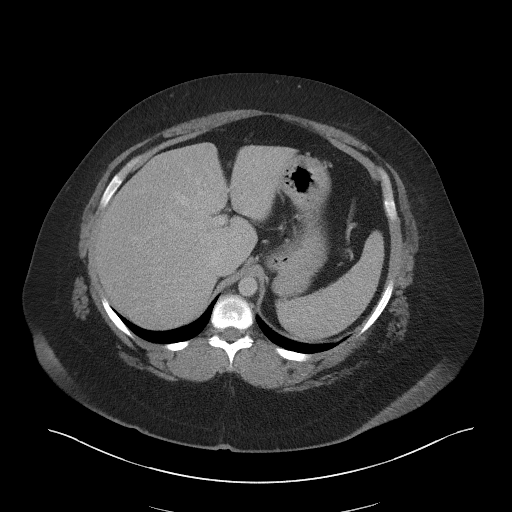
[im 88/100  soft-tissue]
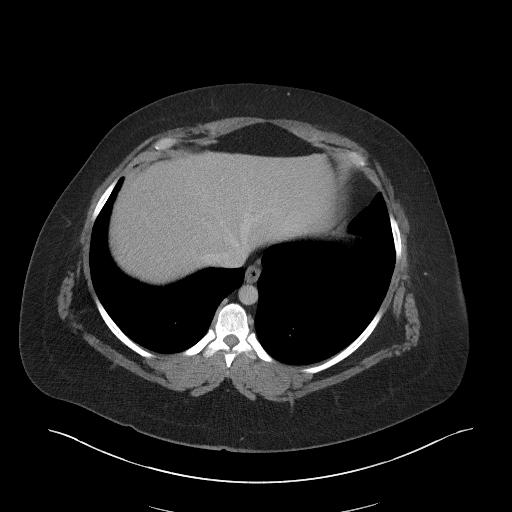
[im 96/100  soft-tissue]
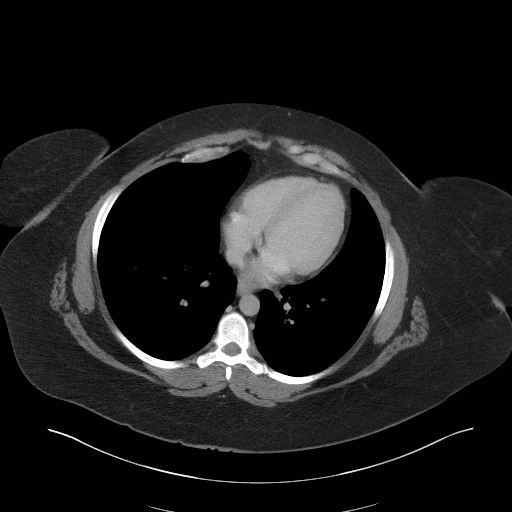

[Series 6: coronal st · coronal · 0.87mm/px · 3 of 112 slices shown]
[im 38/112  soft-tissue]
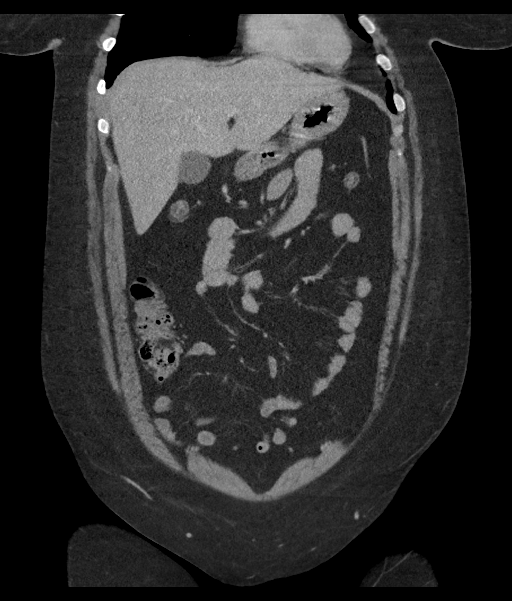
[im 50/112  soft-tissue]
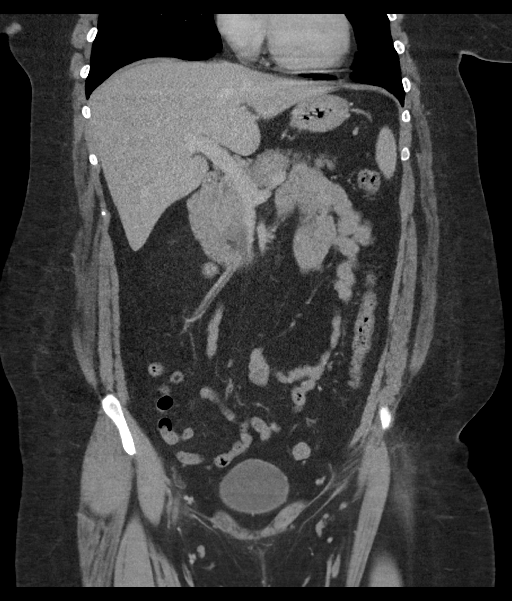
[im 62/112  soft-tissue]
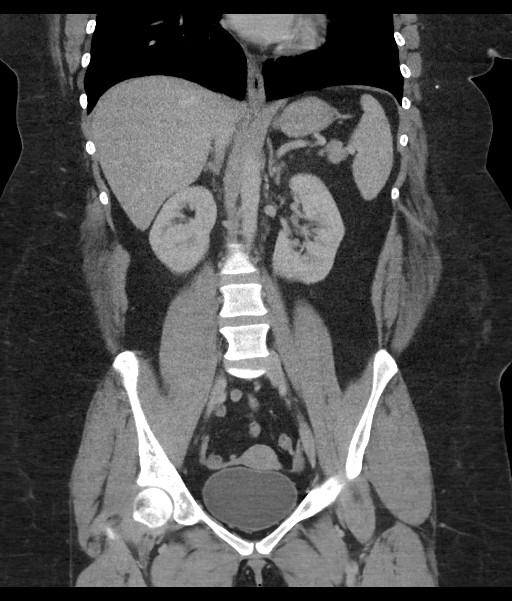

[16 of 46 positions shown; findings below may reference images not displayed]

FINDINGS: Lower chest: Minimal hypoventilatory changes within the dependent
lower lobes. No acute pleural or parenchymal lung disease.

Hepatobiliary: No focal liver abnormality is seen. No gallstones,
gallbladder wall thickening, or biliary dilatation.

Pancreas: Unremarkable. No pancreatic ductal dilatation or
surrounding inflammatory changes.

Spleen: Normal in size without focal abnormality.

Adrenals/Urinary Tract: Slight malrotation of the lower poles of the
bilateral kidneys. Otherwise the kidneys, adrenals, ureters, and
bladder are unremarkable.

Stomach/Bowel: No bowel obstruction or ileus. Normal appendix right
lower quadrant. No inflammatory changes.

Vascular/Lymphatic: No significant vascular findings are present. No
enlarged abdominal or pelvic lymph nodes.

Reproductive: Uterus and bilateral adnexa are unremarkable.

Other: No abdominal wall hernia or abnormality. No abdominopelvic
ascites.

Musculoskeletal: No acute or destructive bony lesions. Reconstructed
images demonstrate no additional findings.
IMPRESSION: 1. No acute intra-abdominal or intrapelvic process.

## 2021-02-27 NOTE — Assessment & Plan Note (Signed)
Resolved with recent doxy antbx course,  to f/u any worsening symptoms or concerns
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
## 2021-02-27 NOTE — Patient Instructions (Signed)
You will be contacted regarding the referral for: general surgury  Please go to the LAB at the blood drawing area for the tests to be done at the [facility] lab

## 2021-02-27 NOTE — Assessment & Plan Note (Signed)
Pt for general surgury eva,  to f/u any worsening symptoms or concerns

## 2021-02-27 NOTE — Progress Notes (Signed)
[name], female   DOB: Oct 30, 1985, 36 y.o.   MRN: [id number]  Virtual Visit via Video Note  I connected with [name] on 02/27/21 at  1:40 PM EST by a video enabled telemedicine application and verified that I am speaking with the correct person using two identifiers.  Location of all participants today Patient: at home Provider: at home   I discussed the limitations of evaluation and management by telemedicine and the availability of in person appointments. The patient expressed understanding and agreed to proceed.  History of Present Illness: Here with c/o 3 days onset about 3 times per day small volume BRBPR with mild discomfort and believes she has a fissure recurred with bleeding.  Denies worsening reflux, abd pain, dysphagia, n/v, bowel change.  Pt denies chest pain, increased sob or doe, wheezing, orthopnea, PND, increased LE swelling, palpitations, dizziness or syncope.   Pt denies polydipsia, polyuria, or new focal neuro s/s.   Pt denies fever, wt loss, night sweats, loss of appetite, or other constitutional symptoms  Her primary source of income is plasma donation twice per wk, and hemocue there has not shown low hgb this wk and was allowed to donate.  Past Medical History:  Diagnosis Date   Allergy    Anxiety    Asthma    Bipolar 2 disorder ([place])    Borderline personality disorder ([place])    Depression    Eczema    Former smoker    Obesity    PTSD (post-traumatic stress disorder)    Urticaria    Past Surgical History:  Procedure Laterality Date   Thumb surgery Left     reports that she quit smoking about 5 years ago. Her smoking use included cigarettes. She has a 4.50 pack-year smoking history. She has never used smokeless tobacco. She reports current alcohol use of about 8.0 standard drinks per week. She reports current drug use. Frequency: 4.00 times per week. Drug: Marijuana. family history includes Allergic rhinitis in her paternal grandmother;  Alzheimer's disease in her maternal grandmother; Asthma in her paternal grandmother; Healthy in her father and mother; Skin cancer in her paternal grandmother. Allergies  Allergen Reactions   Benztropine Other (See Comments)    Report hypersensitivity Report hypersensitivity    Other     Opiates- history of dependency    Lamictal [Lamotrigine] Rash   Current Outpatient Medications on File Prior to Visit  Medication Sig Dispense Refill   albuterol (PROVENTIL) (2.5 MG/3ML) 0.083% nebulizer solution Take 3 mLs (2.5 mg total) by nebulization every 6 (six) hours as needed for wheezing or shortness of breath. 150 mL 1   bacitracin ointment Apply topically 2 (two) times daily. 120 g 0   bismuth subsalicylate (PEPTO BISMOL) 262 MG/15ML suspension Take 30 mLs by mouth every 6 (six) hours as needed for indigestion or diarrhea or loose stools.     budesonide-formoterol (SYMBICORT) 160-4.5 MCG/ACT inhaler Inhale 2 puffs into the lungs 2 (two) times daily. 10.2 g 5   doxycycline (VIBRA-TABS) 100 MG tablet Take 1 tablet (100 mg total) by mouth 2 (two) times daily. 20 tablet 0   fluconazole (DIFLUCAN) 150 MG tablet Take 1 tab by mouth every 3 days as needed 2 tablet 1   fluticasone (FLONASE) 50 MCG/ACT nasal spray Place 2 sprays into both nostrils daily. (Patient taking differently: Place 2 sprays into both nostrils at bedtime.) 16 g 5   folic acid (FOLVITE) 1 MG tablet Take 1 tablet (1 mg total)  by mouth daily. 30 tablet 0   gabapentin (NEURONTIN) 600 MG tablet Take 600 mg by mouth 3 (three) times daily.     hydrOXYzine (ATARAX/VISTARIL) 50 MG tablet Take 1 tablet (50 mg total) by mouth 3 (three) times daily as needed for anxiety. 30 tablet 0   levonorgestrel-ethinyl estradiol (NORDETTE) 0.15-30 MG-MCG tablet Take 0.15 mg by mouth daily.     lurasidone (LATUDA) 80 MG TABS tablet Take 1 tablet (80 mg total) by mouth daily with supper. 30 tablet 0   melatonin 5 MG TABS Take 1 tablet (5 mg total) by mouth at  bedtime. 30 tablet 0   montelukast (SINGULAIR) 10 MG tablet Take 1 tablet (10 mg total) by mouth at bedtime. 30 tablet 0   Multiple Vitamin (MULTIVITAMIN WITH MINERALS) TABS tablet Take 1 tablet by mouth daily. 30 tablet 0   omeprazole (PRILOSEC) 40 MG capsule Take 1 capsule (40 mg total) by mouth 2 (two) times daily. 60 capsule 0   prazosin (MINIPRESS) 1 MG capsule Take 1-3 mg by mouth See admin instructions. Take 1mg  in the morning and 3mg   before bedtime.     topiramate (TOPAMAX) 25 MG tablet Take 1 tablet (25 mg total) by mouth 2 (two) times daily. 60 tablet 0   No current facility-administered medications on file prior to visit.    Observations/Objective: Alert, NAD, appropriate mood and affect, resps normal, cn 2-12 intact, moves all 4s, no visible rash or swelling Lab Results  Component Value Date   WBC 8.8 12/18/2020   HGB 12.0 12/18/2020   HCT 38.1 12/18/2020   PLT 299 12/18/2020   GLUCOSE 106 (H) 12/18/2020   CHOL 202 (H) 12/20/2020   TRIG 103 12/20/2020   HDL 54 12/20/2020   LDLCALC 127 (H) 12/20/2020   ALT 12 12/18/2020   AST 18 12/18/2020   NA 141 12/18/2020   K 3.6 12/18/2020   CL 114 (H) 12/18/2020   CREATININE 0.71 12/18/2020   BUN 10 12/18/2020   CO2 20 (L) 12/18/2020   TSH 4.865 (H) 12/20/2020   HGBA1C 5.2 12/20/2020   Assessment and Plan: See notes  Follow Up Instructions: See notes   I discussed the assessment and treatment plan with the patient. The patient was provided an opportunity to ask questions and all were answered. The patient agreed with the plan and demonstrated an understanding of the instructions.   The patient was advised to call back or seek an in-person evaluation if the symptoms worsen or if the condition fails to improve as anticipated.  Cathlean Cower, MD

## 2021-02-27 NOTE — Assessment & Plan Note (Signed)
Pt for cbc,  to f/u any worsening symptoms or concerns

## 2021-02-28 NOTE — Plan of Care (Signed)
Pt will improve her mood as evidenced by being happy again, managing her mood and coping with daily stressors for 5 out of 7 days for 60 days. °

## 2021-03-04 ENCOUNTER — Encounter (HOSPITAL_COMMUNITY): Payer: Self-pay | Admitting: Family

## 2021-03-04 ENCOUNTER — Other Ambulatory Visit (HOSPITAL_COMMUNITY): Payer: 59 | Admitting: Licensed Clinical Social Worker

## 2021-03-04 ENCOUNTER — Other Ambulatory Visit: Payer: Self-pay

## 2021-03-04 DIAGNOSIS — F4310 Post-traumatic stress disorder, unspecified: Secondary | ICD-10-CM

## 2021-03-04 DIAGNOSIS — F3181 Bipolar II disorder: Secondary | ICD-10-CM
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
## 2021-03-05 ENCOUNTER — Other Ambulatory Visit: Payer: Self-pay

## 2021-03-05 ENCOUNTER — Telehealth (HOSPITAL_COMMUNITY): Payer: Self-pay | Admitting: Psychiatry

## 2021-03-05 ENCOUNTER — Ambulatory Visit (INDEPENDENT_AMBULATORY_CARE_PROVIDER_SITE_OTHER)
Admission: EM | Admit: 2021-03-05 | Discharge: 2021-03-06 | Disposition: A | Discharge status: Other Acute Inpatient Hospital | Payer: 59 | Source: Home / Self Care

## 2021-03-05 ENCOUNTER — Encounter (HOSPITAL_COMMUNITY): Payer: Self-pay | Admitting: Registered Nurse

## 2021-03-05 ENCOUNTER — Other Ambulatory Visit (HOSPITAL_COMMUNITY): Payer: 59 | Attending: Psychiatry | Admitting: Psychiatry

## 2021-03-05 DIAGNOSIS — F4310 Post-traumatic stress disorder, unspecified: Secondary | ICD-10-CM | POA: Insufficient documentation

## 2021-03-05 DIAGNOSIS — Z20822 Contact with and (suspected) exposure to covid-19: Secondary | ICD-10-CM | POA: Insufficient documentation

## 2021-03-05 DIAGNOSIS — Z79899 Other long term (current) drug therapy: Secondary | ICD-10-CM | POA: Insufficient documentation

## 2021-03-05 DIAGNOSIS — R4587 Impulsiveness: Secondary | ICD-10-CM | POA: Insufficient documentation

## 2021-03-05 DIAGNOSIS — F319 Bipolar disorder, unspecified: Secondary | ICD-10-CM | POA: Diagnosis not present

## 2021-03-05 DIAGNOSIS — Z9151 Personal history of suicidal behavior: Secondary | ICD-10-CM | POA: Insufficient documentation

## 2021-03-05 DIAGNOSIS — Z630 Problems in relationship with spouse or partner: Secondary | ICD-10-CM | POA: Insufficient documentation

## 2021-03-05 DIAGNOSIS — F17210 Nicotine dependence, cigarettes, uncomplicated: Secondary | ICD-10-CM | POA: Insufficient documentation

## 2021-03-05 DIAGNOSIS — F439 Reaction to severe stress, unspecified: Secondary | ICD-10-CM | POA: Insufficient documentation

## 2021-03-05 DIAGNOSIS — F3181 Bipolar II disorder: Secondary | ICD-10-CM

## 2021-03-05 DIAGNOSIS — Z9152 Personal history of nonsuicidal self-harm: Secondary | ICD-10-CM | POA: Insufficient documentation

## 2021-03-05 DIAGNOSIS — F332 Major depressive disorder, recurrent severe without psychotic features: Secondary | ICD-10-CM | POA: Diagnosis not present

## 2021-03-05 DIAGNOSIS — R45851 Suicidal ideations: Secondary | ICD-10-CM | POA: Insufficient documentation

## 2021-03-05 LAB — ETHANOL: Alcohol, Ethyl (B): <10 mg/dL (ref <10)

## 2021-03-05 LAB — TSH: TSH: 1.542 u[IU]/mL (ref 0.350–4.500)

## 2021-03-05 LAB — POCT URINE DRUG SCREEN - MANUAL ENTRY (I-SCREEN)
POC Amphetamine UR: NOT DETECTED
POC Buprenorphine (BUP): NOT DETECTED
POC Cocaine UR: NOT DETECTED
POC Marijuana UR: POSITIVE — AB
POC Methadone UR: NOT DETECTED
POC Methamphetamine UR: NOT DETECTED
POC Morphine: NOT DETECTED
POC Oxazepam (BZO): NOT DETECTED
POC Oxycodone UR: NOT DETECTED
POC Secobarbital (BAR): NOT DETECTED

## 2021-03-05 LAB — PREGNANCY, URINE: Preg Test, Ur: NEGATIVE

## 2021-03-05 LAB — URINALYSIS, ROUTINE W REFLEX MICROSCOPIC
Bilirubin Urine: NEGATIVE
Glucose, UA: NEGATIVE mg/dL
Hgb urine dipstick: NEGATIVE
Ketones, ur: NEGATIVE mg/dL
Leukocytes,Ua: NEGATIVE
Nitrite: NEGATIVE
Protein, ur: NEGATIVE mg/dL
Specific Gravity, Urine: 1.025 (ref 1.005–1.030)
pH: 7 (ref 5.0–8.0)

## 2021-03-05 LAB — RESP PANEL BY RT-PCR (FLU A&B, COVID) ARPGX2
Influenza A by PCR: NEGATIVE
Influenza B by PCR: NEGATIVE
SARS Coronavirus 2 by RT PCR: NEGATIVE

## 2021-03-05 LAB — CBC WITH DIFFERENTIAL/PLATELET
Abs Immature Granulocytes: 0.02 10*3/uL (ref 0.00–0.07)
Basophils Absolute: 0.1 10*3/uL (ref 0.0–0.1)
Basophils Relative: 1 %
Eosinophils Absolute: 0.2 10*3/uL (ref 0.0–0.5)
Eosinophils Relative: 3 %
HCT: 41.2 % (ref 36.0–46.0)
Hemoglobin: 13 g/dL (ref 12.0–15.0)
Immature Granulocytes: 0 %
Lymphocytes Relative: 25 %
Lymphs Abs: 2 10*3/uL (ref 0.7–4.0)
MCH: 27.7 pg (ref 26.0–34.0)
MCHC: 31.6 g/dL (ref 30.0–36.0)
MCV: 87.7 fL (ref 80.0–100.0)
Monocytes Absolute: 0.4 10*3/uL (ref 0.1–1.0)
Monocytes Relative: 4 %
Neutro Abs: 5.4 10*3/uL (ref 1.7–7.7)
Neutrophils Relative %: 67 %
Platelets: 381 10*3/uL (ref 150–400)
RBC: 4.7 MIL/uL (ref 3.87–5.11)
RDW: 15.9 % — ABNORMAL HIGH (ref 11.5–15.5)
WBC: 8.1 10*3/uL (ref 4.0–10.5)
nRBC: 0 % (ref 0.0–0.2)

## 2021-03-05 LAB — COMPREHENSIVE METABOLIC PANEL
ALT: 13 U/L (ref 0–44)
AST: 16 U/L (ref 15–41)
Albumin: 2.9 g/dL — ABNORMAL LOW (ref 3.5–5.0)
Alkaline Phosphatase: 61 U/L (ref 38–126)
Anion gap: 6 (ref 5–15)
BUN: 6 mg/dL (ref 6–20)
CO2: 22 mmol/L (ref 22–32)
Calcium: 8.6 mg/dL — ABNORMAL LOW (ref 8.9–10.3)
Chloride: 111 mmol/L (ref 98–111)
Creatinine, Ser: 0.95 mg/dL (ref 0.44–1.00)
GFR, Estimated: >60 mL/min (ref >60)
Glucose, Bld: 99 mg/dL (ref 70–99)
Potassium: 4.4 mmol/L (ref 3.5–5.1)
Sodium: 139 mmol/L (ref 135–145)
Total Bilirubin: 0.3 mg/dL (ref 0.3–1.2)
Total Protein: 5.3 g/dL — ABNORMAL LOW (ref 6.5–8.1)

## 2021-03-05 LAB — MAGNESIUM: Magnesium: 2.1 mg/dL (ref 1.7–2.4)

## 2021-03-05 LAB — POC SARS CORONAVIRUS 2 AG: SARSCOV2ONAVIRUS 2 AG: NEGATIVE

## 2021-03-05 LAB — POCT PREGNANCY, URINE: Preg Test, Ur: NEGATIVE

## 2021-03-05 LAB — POC SARS CORONAVIRUS 2 AG -  ED: SARS Coronavirus 2 Ag: NEGATIVE

## 2021-03-05 MED ORDER — LEVONORGESTREL-ETHINYL ESTRAD 0.15-30 MG-MCG PO TABS: 1.0000 | ORAL_TABLET | Freq: Every day | ORAL | Status: DC

## 2021-03-05 MED ORDER — TOPIRAMATE 25 MG PO TABS
25.0000 mg | ORAL_TABLET | Freq: Two times a day (BID) | ORAL | Status: DC
  Administered 2021-03-05 – 2021-03-06 (×3): 25 mg via ORAL
  Filled 2021-03-05 (×3): qty 1

## 2021-03-05 MED ORDER — ALUM & MAG HYDROXIDE-SIMETH 200-200-20 MG/5ML PO SUSP: 30.0000 mL | ORAL | Status: DC | PRN

## 2021-03-05 MED ORDER — MONTELUKAST SODIUM 10 MG PO TABS
10.0000 mg | ORAL_TABLET | Freq: Every day | ORAL | Status: DC
  Administered 2021-03-05: 10 mg via ORAL
  Filled 2021-03-05: qty 1

## 2021-03-05 MED ORDER — HYDROXYZINE HCL 25 MG PO TABS
50.0000 mg | ORAL_TABLET | Freq: Three times a day (TID) | ORAL | Status: DC
  Administered 2021-03-05 – 2021-03-06 (×3): 50 mg via ORAL
  Filled 2021-03-05 (×3): qty 2

## 2021-03-05 MED ORDER — HYDROXYZINE HCL 25 MG PO TABS: 25.0000 mg | ORAL_TABLET | Freq: Three times a day (TID) | ORAL | Status: DC | PRN

## 2021-03-05 MED ORDER — BISMUTH SUBSALICYLATE 262 MG PO CHEW
262.0000 mg | CHEWABLE_TABLET | Freq: Four times a day (QID) | ORAL | Status: DC | PRN
  Administered 2021-03-06: 262 mg via ORAL
  Filled 2021-03-05: qty 1

## 2021-03-05 MED ORDER — LURASIDONE HCL 40 MG PO TABS
120.0000 mg | ORAL_TABLET | Freq: Every day | ORAL | Status: DC
  Administered 2021-03-05 – 2021-03-06 (×2): 120 mg via ORAL
  Filled 2021-03-05 (×2): qty 3

## 2021-03-05 MED ORDER — ALBUTEROL SULFATE (2.5 MG/3ML) 0.083% IN NEBU: 2.5000 mg | INHALATION_SOLUTION | Freq: Four times a day (QID) | RESPIRATORY_TRACT | Status: DC | PRN

## 2021-03-05 MED ORDER — ACETAMINOPHEN 325 MG PO TABS: 650.0000 mg | ORAL_TABLET | Freq: Four times a day (QID) | ORAL | Status: DC | PRN

## 2021-03-05 MED ORDER — GABAPENTIN 600 MG PO TABS
600.0000 mg | ORAL_TABLET | Freq: Three times a day (TID) | ORAL | Status: DC
  Administered 2021-03-05 – 2021-03-06 (×3): 600 mg via ORAL
  Filled 2021-03-05 (×3): qty 1

## 2021-03-05 MED ORDER — BISMUTH SUBSALICYLATE 262 MG/15ML PO SUSP
30.0000 mL | Freq: Four times a day (QID) | ORAL | Status: DC | PRN
  Filled 2021-03-05: qty 118

## 2021-03-05 MED ORDER — TRAZODONE HCL 50 MG PO TABS
50.0000 mg | ORAL_TABLET | Freq: Every evening | ORAL | Status: DC | PRN
  Administered 2021-03-05: 50 mg via ORAL
  Filled 2021-03-05: qty 1

## 2021-03-05 MED ORDER — LORAZEPAM 1 MG PO TABS
1.0000 mg | ORAL_TABLET | Freq: Once | ORAL | Status: AC
  Administered 2021-03-05: 1 mg via ORAL
  Filled 2021-03-05: qty 1

## 2021-03-05 MED ORDER — MAGNESIUM HYDROXIDE 400 MG/5ML PO SUSP: 30.0000 mL | Freq: Every day | ORAL | Status: DC | PRN

## 2021-03-05 MED ORDER — PRAZOSIN HCL 1 MG PO CAPS
1.0000 mg | ORAL_CAPSULE | Freq: Every day | ORAL | Status: DC
  Administered 2021-03-06: 1 mg via ORAL
  Filled 2021-03-05: qty 1

## 2021-03-05 MED ORDER — ADULT MULTIVITAMIN W/MINERALS CH
1.0000 | ORAL_TABLET | Freq: Every day | ORAL | Status: DC
  Administered 2021-03-05 – 2021-03-06 (×2): 1 via ORAL
  Filled 2021-03-05 (×2): qty 1

## 2021-03-05 MED ORDER — PANTOPRAZOLE SODIUM 40 MG PO TBEC
40.0000 mg | DELAYED_RELEASE_TABLET | Freq: Every day | ORAL | Status: DC
  Administered 2021-03-05 – 2021-03-06 (×2): 40 mg via ORAL
  Filled 2021-03-05 (×2): qty 1

## 2021-03-05 MED ORDER — PRAZOSIN HCL 1 MG PO CAPS
3.0000 mg | ORAL_CAPSULE | Freq: Every day | ORAL | Status: DC
  Administered 2021-03-05: 3 mg via ORAL
  Filled 2021-03-05: qty 3
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
## 2021-03-05 NOTE — ED Notes (Signed)
Pt is now on the telephone. Pt calm and composed. No signs of distress.

## 2021-03-05 NOTE — ED Notes (Signed)
Pt reading a book and content.
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
## 2021-03-05 NOTE — Progress Notes (Signed)
Pt was accepted to [facility] 03/06/21 at 1130-noon  Pt meets inpatient criteria per Earleen Newport, NP  Attending Physician will be MD Janine Limbo  Report can be called to: -Adult unit: [phone]  Care Team notified via secure chat: [facility] Day shift AC Leonia Reader, RN, Earleen Newport, NP, Jeanie Sewer, RN, Larry Sierras, Marinda Elk, RN, Roxy Manns, RN, Ernie Hew, MD, Lynnda Shields, RN  Nadara Mode, [place] 03/05/2021 @ 7:49 PM
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
## 2021-03-05 NOTE — ED Notes (Signed)
Pt refused a hot dinner. She wanted a PBJ sandwich instead, with cranberry juice.

## 2021-03-05 NOTE — ED Notes (Addendum)
Received call from [place], [place] @ [facility] with update on pt being transferred to [facility] as addendum to previous note. Pt excepted to [facility] tomorrow morning around noon. Bed assignment 306-2. Informed pt of updated status. Verbalized understanding. Safety maintained.

## 2021-03-05 NOTE — ED Notes (Signed)
Provider notified of patient request. Order received for Ativan.

## 2021-03-05 NOTE — Telephone Encounter (Signed)
D:  Group leader for MH-IOP Shade Flood, [place]) requested that case manager reach out to patient because she was having a difficult time in group this morning. Placed call to patient but didn't get an answer.  A:  Logged into the group and Tommi Rumps put case mgr and pt in a room. Patient was very tearful.  "I feel like sh--."  Patient states she hasn't been well for a couple of days.  C/O of SI, intrusive thoughts.  "My ex won't talk to me.  I have no one here in town.  I'm so lonely."  Pt admits to SI with a plan.  States roommate has a lot of box cutters lying around in the home.  "I look at these box cutters and think I should use one." Reports roommate will be leaving for work later today and she'll be at home alone. Strongly encouraged pt to ask roommate to drive her to [facility] to be assessed for inpatient admit [facility]) for her safety.  Informed Ricky Ala, NP.  R:  Patient receptive.

## 2021-03-05 NOTE — ED Notes (Signed)
Received patient this PM. Patient is sitting in chair crying. Expressed she needs medication to help her calm down. Advised patient that she had medication at 6pm. She says its not working. Will make provider aware of request.  Patient respirations are even and unlabored. Will continue to monitor for safety.

## 2021-03-05 NOTE — Progress Notes (Signed)
Virtual Visit via Video Note   I connected with [name] on 03/05/21 at  9:00 AM EDT by a video enabled telemedicine application and verified that I am speaking with the correct person using two identifiers.   At orientation to the IOP program, Case Manager discussed the limitations of evaluation and management by telemedicine and the availability of in person appointments. The patient expressed understanding and agreed to proceed with virtual visits throughout the duration of the program.   Location:  Patient: Patient Home Provider: OPT [place] Office   History of Present Illness: Bipolar D/O and PTSD    Observations/Objective: Check In: Case Manager checked in with all participants to review discharge dates, insurance authorizations, work-related documents and needs from the treatment team regarding medications. Micaella stated needs and engaged in discussion.    Initial Therapeutic Activity: Counselor facilitated a check-in with Carlota to assess for safety, sobriety and medication compliance.  Counselor also inquired about Teighan's current emotional ratings, as well as any significant changes in thoughts, feelings or behavior since previous check in.  Media presented for session on time and was alert, oriented x5, with no evidence or self-report of active HI or A/V H.  Delories reported compliance with medication and denied use of alcohol or illicit substances.  Kodee reported scores of 10/10 for depression, 10/10 for anxiety, and 10/10 for anger/irritability.  Othelia denied any recent outbursts or panic attacks.  Sheera denied any recent successes and disclosed that she has been experiencing SI this morning, along with development of a plan to self-harm with box cutters that her roommate brings home from work and will leave around the home.  Elyse stated I'm thinking of ways to see my ex and I know I shouldn't because I'll just end up wanting to hurt myself.  Counselor informed case manager about Brynnly's SI, and  placed them in a virtual room to discuss safety planning measures. Shyanne reported that she would have her roommate transport her to the [facility] [place] immediately in order to be assessed for safety.       Second Therapeutic Activity: Counselor introduced topic of stress management today.  Counselor provided definition of stress as feeling tense, overwhelmed, worn out, and/or exhausted, and noted that in small amounts, stress can be motivating until things become too overwhelming to manage.  Counselor also explained how stress can be acute (brief but intense) or chronic (long-lasting) and this can impact the severity of symptoms one can experience in the physical, emotional, and behavioral categories.  Counselor inquired about the types of anxiety/stress that members tend to experience, as well as the impact it has had upon their thoughts, feelings, and behavior.  Counselor introduced several helpful strategies for members to explore to aid in providing stress reduction, such as mindful breathing to stay grounded and clear mind, progressive muscle relaxation to alleviate tension in the body, and guided imagery to visualize a safe space one can temporarily escape to when needed.  Counselor also discussed various cognitive distortions which could lead to feelings of increased stress, such as jumping to conclusions, overgeneralizing, mental filtering, and labeling.  Counselor asked which ones members could relate to, and recommended keeping a thought journal to document these unhelpful thinking styles, and begin to challenge them to interrupt cycle.  Counselor encouraged members to consider discussing stressor 'red flags' with their close supports that can be monitored and strategies for assisting them in times of crisis.  Intervention effectiveness could not  be measured, as Grettel left session at 10:15am after discussing safety plan with treatment team.    Assessment and  Plan: Counselor recommends that Taziyah remain in IOP treatment to better manage mental health symptoms, ensure stability and pursue completion of treatment plan goals. Counselor recommends adherence to crisis/safety plan, taking medications as prescribed, and following up with medical professionals if any issues arise.   Follow Up Instructions: Counselor will send Webex link for next session. Erlene was advised to call back or seek an in-person evaluation if the symptoms worsen or if the condition fails to improve as anticipated.   I provided 75 minutes of non-face-to-face time during this encounter.   Shade Flood, [name], LCAS 03/05/21

## 2021-03-05 NOTE — BH Assessment (Signed)
[name] is emergent. SI with plan to slit wrist or throat with box cutter . Sent to [facility] from IOP.

## 2021-03-05 NOTE — ED Notes (Signed)
Pt sitting up on bed reading a book in no acute distress. Informed pt that she would be transported to [facility] tonight around 2100 for inpatient stay. Pt verbalized understanding and agreement. Pt signed voluntary consent for admission and treatment to [facility]. Form faxed via staff. Safety maintained.

## 2021-03-06 ENCOUNTER — Other Ambulatory Visit: Payer: Self-pay

## 2021-03-06 ENCOUNTER — Encounter (HOSPITAL_COMMUNITY): Payer: Self-pay | Admitting: Family

## 2021-03-06 ENCOUNTER — Ambulatory Visit (HOSPITAL_COMMUNITY): Payer: 59

## 2021-03-06 ENCOUNTER — Inpatient Hospital Stay (HOSPITAL_COMMUNITY)
Admission: AD | Admit: 2021-03-06 | Discharge: 2021-03-10 | DRG: 885 | Disposition: A | Discharge status: Home / Self Care | Payer: 59 | Source: Intra-hospital | Attending: Psychiatry | Admitting: Psychiatry

## 2021-03-06 DIAGNOSIS — F429 Obsessive-compulsive disorder, unspecified: Secondary | ICD-10-CM | POA: Diagnosis present

## 2021-03-06 DIAGNOSIS — F515 Nightmare disorder: Secondary | ICD-10-CM | POA: Diagnosis present

## 2021-03-06 DIAGNOSIS — Z825 Family history of asthma and other chronic lower respiratory diseases: Secondary | ICD-10-CM | POA: Diagnosis not present

## 2021-03-06 DIAGNOSIS — F332 Major depressive disorder, recurrent severe without psychotic features: Secondary | ICD-10-CM | POA: Diagnosis present

## 2021-03-06 DIAGNOSIS — F909 Attention-deficit hyperactivity disorder, unspecified type: Secondary | ICD-10-CM | POA: Diagnosis present

## 2021-03-06 DIAGNOSIS — K602 Anal fissure, unspecified: Secondary | ICD-10-CM | POA: Diagnosis present

## 2021-03-06 DIAGNOSIS — Z7951 Long term (current) use of inhaled steroids: Secondary | ICD-10-CM | POA: Diagnosis not present

## 2021-03-06 DIAGNOSIS — Z87891 Personal history of nicotine dependence: Secondary | ICD-10-CM | POA: Diagnosis not present

## 2021-03-06 DIAGNOSIS — F411 Generalized anxiety disorder: Secondary | ICD-10-CM | POA: Diagnosis present

## 2021-03-06 DIAGNOSIS — Z62810 Personal history of physical and sexual abuse in childhood: Secondary | ICD-10-CM | POA: Diagnosis present

## 2021-03-06 DIAGNOSIS — Z9151 Personal history of suicidal behavior: Secondary | ICD-10-CM | POA: Diagnosis not present

## 2021-03-06 DIAGNOSIS — F410 Panic disorder [episodic paroxysmal anxiety] without agoraphobia: Secondary | ICD-10-CM | POA: Diagnosis present

## 2021-03-06 DIAGNOSIS — Z888 Allergy status to other drugs, medicaments and biological substances status: Secondary | ICD-10-CM

## 2021-03-06 DIAGNOSIS — F1111 Opioid abuse, in remission: Secondary | ICD-10-CM | POA: Diagnosis present

## 2021-03-06 DIAGNOSIS — F603 Borderline personality disorder: Secondary | ICD-10-CM | POA: Diagnosis present

## 2021-03-06 DIAGNOSIS — Z20822 Contact with and (suspected) exposure to covid-19: Secondary | ICD-10-CM | POA: Diagnosis present

## 2021-03-06 DIAGNOSIS — Z79899 Other long term (current) drug therapy: Secondary | ICD-10-CM | POA: Diagnosis not present

## 2021-03-06 DIAGNOSIS — Z62811 Personal history of psychological abuse in childhood: Secondary | ICD-10-CM | POA: Diagnosis present

## 2021-03-06 DIAGNOSIS — F3181 Bipolar II disorder: Secondary | ICD-10-CM | POA: Diagnosis present

## 2021-03-06 DIAGNOSIS — R45851 Suicidal ideations: Secondary | ICD-10-CM | POA: Diagnosis present

## 2021-03-06 DIAGNOSIS — J45909 Unspecified asthma, uncomplicated: Secondary | ICD-10-CM | POA: Diagnosis present

## 2021-03-06 DIAGNOSIS — F4310 Post-traumatic stress disorder, unspecified: Secondary | ICD-10-CM | POA: Diagnosis present

## 2021-03-06 LAB — HEMOGLOBIN A1C
Hgb A1c MFr Bld: 5.5 % (ref 4.8–5.6)
Mean Plasma Glucose: 111 mg/dL

## 2021-03-06 LAB — GC/CHLAMYDIA PROBE AMP (~~LOC~~) NOT AT ARMC
Chlamydia: NEGATIVE
Comment: NEGATIVE
Comment: NORMAL
Neisseria Gonorrhea: NEGATIVE

## 2021-03-06 MED ORDER — ADULT MULTIVITAMIN W/MINERALS CH
1.0000 | ORAL_TABLET | Freq: Every day | ORAL | Status: DC
  Administered 2021-03-07 – 2021-03-10 (×4): 1 via ORAL
  Filled 2021-03-06 (×6): qty 1

## 2021-03-06 MED ORDER — MONTELUKAST SODIUM 10 MG PO TABS
10.0000 mg | ORAL_TABLET | Freq: Every day | ORAL | Status: DC
  Administered 2021-03-06 – 2021-03-09 (×4): 10 mg via ORAL
  Filled 2021-03-06 (×5): qty 1

## 2021-03-06 MED ORDER — ALBUTEROL SULFATE HFA 108 (90 BASE) MCG/ACT IN AERS
2.0000 | INHALATION_SPRAY | Freq: Four times a day (QID) | RESPIRATORY_TRACT | Status: DC | PRN
  Administered 2021-03-08: 4 via RESPIRATORY_TRACT
  Filled 2021-03-06: qty 6.7

## 2021-03-06 MED ORDER — ACETAMINOPHEN 325 MG PO TABS: 650.0000 mg | ORAL_TABLET | Freq: Four times a day (QID) | ORAL | Status: DC | PRN

## 2021-03-06 MED ORDER — LURASIDONE HCL 60 MG PO TABS
120.0000 mg | ORAL_TABLET | Freq: Every day | ORAL | Status: DC
  Administered 2021-03-06: 120 mg via ORAL
  Filled 2021-03-06 (×3): qty 2

## 2021-03-06 MED ORDER — ALUM & MAG HYDROXIDE-SIMETH 200-200-20 MG/5ML PO SUSP: 30.0000 mL | ORAL | Status: DC | PRN

## 2021-03-06 MED ORDER — GABAPENTIN 600 MG PO TABS
600.0000 mg | ORAL_TABLET | Freq: Three times a day (TID) | ORAL | Status: DC
  Administered 2021-03-06 – 2021-03-10 (×12): 600 mg via ORAL
  Filled 2021-03-06 (×18): qty 1

## 2021-03-06 MED ORDER — PRAZOSIN HCL 1 MG PO CAPS
3.0000 mg | ORAL_CAPSULE | Freq: Every day | ORAL | Status: DC
  Administered 2021-03-06 – 2021-03-09 (×4): 3 mg via ORAL
  Filled 2021-03-06 (×5): qty 3

## 2021-03-06 MED ORDER — BISMUTH SUBSALICYLATE 262 MG/15ML PO SUSP
30.0000 mL | Freq: Four times a day (QID) | ORAL | Status: DC | PRN
  Filled 2021-03-06: qty 118

## 2021-03-06 MED ORDER — PANTOPRAZOLE SODIUM 40 MG PO TBEC
40.0000 mg | DELAYED_RELEASE_TABLET | Freq: Every day | ORAL | Status: DC
  Administered 2021-03-07 – 2021-03-08 (×2): 40 mg via ORAL
  Filled 2021-03-06 (×4): qty 1

## 2021-03-06 MED ORDER — BISMUTH SUBSALICYLATE 262 MG PO CHEW
262.0000 mg | CHEWABLE_TABLET | Freq: Four times a day (QID) | ORAL | Status: DC | PRN
  Administered 2021-03-06: 262 mg via ORAL
  Filled 2021-03-06 (×2): qty 1

## 2021-03-06 MED ORDER — MAGNESIUM HYDROXIDE 400 MG/5ML PO SUSP: 30.0000 mL | Freq: Every day | ORAL | Status: DC | PRN

## 2021-03-06 MED ORDER — HYDROXYZINE HCL 50 MG PO TABS
50.0000 mg | ORAL_TABLET | Freq: Three times a day (TID) | ORAL | Status: DC
  Administered 2021-03-06 – 2021-03-10 (×12): 50 mg via ORAL
  Filled 2021-03-06 (×17): qty 1

## 2021-03-06 NOTE — ED Notes (Signed)
Pt is very anxious waiting to leave for another facility.
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
## 2021-03-06 NOTE — ED Notes (Signed)
Patient is sleeping in her bed. Patient respirations are even and unlabored. Will continue to monitor for safety.

## 2021-03-06 NOTE — ED Notes (Signed)
Pt was given dry cereal and cranberry juice per pt request for breakfast.

## 2021-03-06 NOTE — ED Notes (Signed)
Pt continues to sleep.

## 2021-03-06 NOTE — Tx Team (Signed)
Initial Treatment Plan 03/06/2021 2:44 PM [name] [id number]    PATIENT STRESSORS: Financial difficulties   Loss of relationships (recent breakup with boyfriend & friend)   Occupational concerns     PATIENT STRENGTHS: Ability for insight  Capable of independent living  Communication skills  Physical Health  Supportive family/friends  Work skills    PATIENT IDENTIFIED PROBLEMS: Alterations in mood "I've been feeling really depressed and anxious lately since I my breakup from my boyfriend".    Financial constraints    Suicidal ideations "I've been having intrusive suicidal thoughts to hurt myself".    Substance Abuse "I use the legal stuff, delta 8 & [place] 0"         DISCHARGE CRITERIA:  Improved stabilization in mood, thinking, and/or behavior Verbal commitment to aftercare and medication compliance  PRELIMINARY DISCHARGE PLAN: Outpatient therapy Return to previous living arrangement  PATIENT/FAMILY INVOLVEMENT: This treatment plan has been presented to and reviewed with the patient, [name]. The patient have been given the opportunity to ask questions and make suggestions.  Keane Police, RN 03/06/2021, 2:44 PM

## 2021-03-06 NOTE — Progress Notes (Signed)
°   03/06/21 2150  Psych Admission Type (Psych Patients Only)  Admission Status Voluntary  Psychosocial Assessment  Patient Complaints Anxiety;Depression  Eye Contact Fair  Facial Expression Anxious  Affect Appropriate to circumstance  Speech Logical/coherent  Interaction Demanding;Defensive;Needy  Motor Activity Restless  Appearance/Hygiene Unremarkable  Behavior Characteristics Appropriate to situation  Mood Anxious  Thought Process  Coherency Concrete thinking  Content Blaming others  Delusions None reported or observed  Perception WDL  Hallucination None reported or observed  Judgment Impaired  Confusion None  Danger to Self  Current suicidal ideation? Denies  Self-Injurious Behavior No self-injurious ideation or behavior indicators observed or expressed   Agreement Not to Harm Self Yes  Description of Agreement verbal contract  Danger to Others  Danger to Others None reported or observed

## 2021-03-06 NOTE — ED Notes (Signed)
Report called to Benjamine Mola, RN, @[facility] . Safe transport called.

## 2021-03-06 NOTE — ED Notes (Signed)
Pt transferred to [facility] via safe transport with belongings secured by transport driver. Pt continue to endorse SI w/plan to use box cutter. Pt verbalized understanding and agreement to tx plan. Contracted for safety prior to transferring to [facility]. Support and encouragement provided by staff. Safety maintained.

## 2021-03-06 NOTE — ED Notes (Signed)
Pt A&O x4. Continue to endorse SI this am. Denies HI/AVH. Pt states, "I feel even worse today because y'all won't let me have my bra. Pt's bra was secured in locker yesterday due to it having underwire and pt didn't want it removed. Staff offered to give pt hospital mesh underwear to as substitute to secure breast but pt refuse. Pt states, "Everywhere else let me keep my bra with wire because my breast are so big. I need the support". Staff attempted to explain reason for securing bra was for safety measures but pt refused staff explanation. Pt states, "I can't get better if I don't feel better about my appearance. Y'all just don't get it". Pt asked for something for diarrhea. Pt returned to chair and currently on telephone. Safety maintained.

## 2021-03-06 NOTE — ED Notes (Signed)
Patient denies [place]. But patient states her head is still not right. Night medication administered without difficulty to patient. Patient spent a couple of hours on the phone talking to a friend. Patient was no longer tearful and when the call was ended she went to bed. Patient stated no complaints at present. Patient is cooperative and interacts well with staff. Respiratory is even and unlabored. No distress noted. Patient sleeping in bed at present. will continue to monitor for safety.

## 2021-03-06 NOTE — ED Notes (Signed)
Pt continues to sleep quitely.

## 2021-03-06 NOTE — ED Notes (Signed)
Pt sleeping in no acute distress. RR even and unlabored. Safety maintained. 

## 2021-03-07 ENCOUNTER — Encounter (HOSPITAL_COMMUNITY): Payer: Self-pay

## 2021-03-07 MED ORDER — OXCARBAZEPINE 300 MG PO TABS
300.0000 mg | ORAL_TABLET | Freq: Two times a day (BID) | ORAL | Status: DC
  Filled 2021-03-07 (×2): qty 1

## 2021-03-07 MED ORDER — LURASIDONE HCL 60 MG PO TABS
120.0000 mg | ORAL_TABLET | Freq: Every day | ORAL | Status: DC
  Administered 2021-03-07 – 2021-03-09 (×3): 120 mg via ORAL
  Filled 2021-03-07 (×4): qty 2

## 2021-03-07 MED ORDER — PRAZOSIN HCL 1 MG PO CAPS
1.0000 mg | ORAL_CAPSULE | Freq: Every day | ORAL | Status: DC
  Administered 2021-03-08: 1 mg via ORAL
  Filled 2021-03-07 (×4): qty 1

## 2021-03-07 MED ORDER — LORAZEPAM 1 MG PO TABS: 1.0000 mg | ORAL_TABLET | Freq: Four times a day (QID) | ORAL | Status: DC | PRN

## 2021-03-07 MED ORDER — ONDANSETRON 4 MG PO TBDP: 4.0000 mg | ORAL_TABLET | Freq: Four times a day (QID) | ORAL | Status: DC | PRN

## 2021-03-07 MED ORDER — OXCARBAZEPINE 150 MG PO TABS
150.0000 mg | ORAL_TABLET | Freq: Two times a day (BID) | ORAL | Status: DC
  Administered 2021-03-07 – 2021-03-10 (×6): 150 mg via ORAL
  Filled 2021-03-07 (×9): qty 1

## 2021-03-07 MED ORDER — DOCUSATE SODIUM 100 MG PO CAPS: 100.0000 mg | ORAL_CAPSULE | Freq: Every day | ORAL | Status: DC | PRN

## 2021-03-07 MED ORDER — THIAMINE HCL 100 MG PO TABS
100.0000 mg | ORAL_TABLET | Freq: Every day | ORAL | Status: DC
  Administered 2021-03-08 – 2021-03-10 (×3): 100 mg via ORAL
  Filled 2021-03-07 (×4): qty 1

## 2021-03-07 MED ORDER — LOPERAMIDE HCL 2 MG PO CAPS
2.0000 mg | ORAL_CAPSULE | ORAL | Status: DC | PRN
  Administered 2021-03-07 (×2): 2 mg via ORAL
  Filled 2021-03-07 (×2): qty 1

## 2021-03-07 NOTE — Progress Notes (Signed)
Patient did attend the last half of the evening speaker AA meeting.  

## 2021-03-07 NOTE — [facility] Group Notes (Signed)
Pt  attended and contributed to group discussion.

## 2021-03-07 NOTE — Group Note (Signed)
[name] Group Therapy Note   Group Date: 03/07/2021 Start Time: 1300 End Time: 1400   Type of Therapy and Topic:  Group Therapy: Boundaries  Participation Level:  Active  Description of Group: This group will address the use of boundaries in their personal lives. Patients will explore why boundaries are important, the difference between healthy and unhealthy boundaries, and negative and postive outcomes of different boundaries and will look at how boundaries can be crossed.  Patients will be encouraged to identify current boundaries in their own lives and identify what kind of boundary is being set. Facilitators will guide patients in utilizing problem-solving interventions to address and correct types boundaries being used and to address when no boundary is being used. Understanding and applying boundaries will be explored and addressed for obtaining and maintaining a balanced life. Patients will be encouraged to explore ways to assertively make their boundaries and needs known to significant others in their lives, using other group members and facilitator for role play, support, and feedback.  Therapeutic Goals:  1.  Patient will identify areas in their life where setting clear boundaries could be  used to improve their life.  2.  Patient will identify signs/triggers that a boundary is not being respected. 3.  Patient will identify two ways to set boundaries in order to achieve balance in  their lives: 4.  Patient will demonstrate ability to communicate their needs and set boundaries  through discussion and/or role plays  Summary of Patient Progress:  Pt was present/active throughout the session and proved open to feedback from [place] and peers. Patient demonstrated insight into the subject matter, was respectful of peers, and was present throughout the entire session.  Therapeutic Modalities:   Cognitive Behavioral Therapy Solution-Focused Therapy  [name] 03/07/2021  1:18 PM
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
## 2021-03-07 NOTE — BHH Group Notes (Signed)
Pt attended and contributed to goals group.

## 2021-03-07 NOTE — Progress Notes (Signed)
Pt irritable at times, but mostly able to keep composure during the shift.  Pt took medications as prescribed and no adverse reactions were noted.    03/07/21 1100  Psych Admission Type (Psych Patients Only)  Admission Status Voluntary  Psychosocial Assessment  Patient Complaints Anxiety;Depression;Sadness;Shakiness  Eye Contact Fair  Facial Expression Anxious  Affect Appropriate to circumstance  Speech Logical/coherent  Interaction Demanding  Motor Activity Restless  Appearance/Hygiene Unremarkable  Behavior Characteristics Anxious  Mood Anxious;Depressed  Thought Process  Coherency Concrete thinking  Content Blaming others  Delusions None reported or observed  Perception WDL  Hallucination None reported or observed  Judgment Impaired  Confusion None  Danger to Self  Current suicidal ideation? Denies  Self-Injurious Behavior No self-injurious ideation or behavior indicators observed or expressed   Agreement Not to Harm Self Yes  Description of Agreement verbal contract  Danger to Others  Danger to Others None reported or observed

## 2021-03-07 NOTE — BH IP Treatment Plan (Signed)
Interdisciplinary Treatment and Diagnostic Plan Update  03/07/2021 [name] MRN: [id number]  Principal Diagnosis: <principal problem not specified>  Secondary Diagnoses: Active Problems:   MDD (major depressive disorder), recurrent episode, severe ([facility])   Current Medications:  Current Facility-Administered Medications  Medication Dose Route Frequency Provider Last Rate Last Admin   acetaminophen (TYLENOL) tablet 650 mg  650 mg Oral Q6H PRN Lucky Rathke, FNP       albuterol (VENTOLIN HFA) 108 (90 Base) MCG/ACT inhaler 2-4 puff  2-4 puff Inhalation Q6H PRN Lucky Rathke, FNP       alum & mag hydroxide-simeth (MAALOX/MYLANTA) 200-200-20 MG/5ML suspension 30 mL  30 mL Oral Q4H PRN Lucky Rathke, FNP       bismuth subsalicylate (PEPTO BISMOL) chewable tablet 262 mg  262 mg Oral Q6H PRN Janine Limbo, MD   262 mg at 03/06/21 2004   gabapentin (NEURONTIN) tablet 600 mg  600 mg Oral TID Lucky Rathke, FNP   600 mg at 03/07/21 0758   hydrOXYzine (ATARAX) tablet 50 mg  50 mg Oral TID Lucky Rathke, FNP   50 mg at 03/07/21 6226   Lurasidone HCl TABS 120 mg  120 mg Oral QHS Lucky Rathke, FNP   120 mg at 03/06/21 2150   magnesium hydroxide (MILK OF MAGNESIA) suspension 30 mL  30 mL Oral Daily PRN Lucky Rathke, FNP       montelukast (SINGULAIR) tablet 10 mg  10 mg Oral QHS Lucky Rathke, FNP   10 mg at 03/06/21 2150   multivitamin with minerals tablet 1 tablet  1 tablet Oral Daily Lucky Rathke, FNP   1 tablet at 03/07/21 0758   pantoprazole (PROTONIX) EC tablet 40 mg  40 mg Oral Daily Lucky Rathke, FNP   40 mg at 03/07/21 0758   prazosin (MINIPRESS) capsule 3 mg  3 mg Oral QHS Lucky Rathke, FNP   3 mg at 03/06/21 2150   PTA Medications: Medications Prior to Admission  Medication Sig Dispense Refill Last Dose   albuterol (PROVENTIL) (2.5 MG/3ML) 0.083% nebulizer solution Take 3 mLs (2.5 mg total) by nebulization every 6 (six) hours as needed for wheezing or shortness of breath. 150 mL  1    albuterol (VENTOLIN HFA) 108 (90 Base) MCG/ACT inhaler Inhale 2-4 puffs into the lungs every 6 (six) hours as needed for wheezing or shortness of breath.      bismuth subsalicylate (PEPTO BISMOL) 262 MG/15ML suspension Take 30 mLs by mouth every 6 (six) hours as needed for indigestion or diarrhea or loose stools.      budesonide-formoterol (SYMBICORT) 160-4.5 MCG/ACT inhaler Inhale 2 puffs into the lungs 2 (two) times daily. 10.2 g 5    Ferrous Sulfate (IRON PO) Take 1 tablet by mouth daily.      fluticasone (FLONASE) 50 MCG/ACT nasal spray Place 2 sprays into both nostrils daily. (Patient taking differently: Place 2 sprays into both nostrils at bedtime.) 16 g 5    gabapentin (NEURONTIN) 600 MG tablet Take 600 mg by mouth 3 (three) times daily.      hydrOXYzine (VISTARIL) 50 MG capsule Take 50 mg by mouth 3 (three) times daily.      LATUDA 120 MG TABS Take 120 mg by mouth at bedtime.      levonorgestrel-ethinyl estradiol (NORDETTE) 0.15-30 MG-MCG tablet Take 1 tablet by mouth at bedtime.      montelukast (SINGULAIR) 10 MG tablet Take 1 tablet (10 mg total)  by mouth at bedtime. 30 tablet 0    Multiple Vitamin (MULTIVITAMIN WITH MINERALS) TABS tablet Take 1 tablet by mouth daily. 30 tablet 0    NYSTATIN-TRIAMCINOLONE EX Apply 1 application topically as needed (For yeast infections).      omeprazole (PRILOSEC) 40 MG capsule Take 1 capsule (40 mg total) by mouth 2 (two) times daily. 60 capsule 0    POTASSIUM PO Take 1 tablet by mouth daily.      prazosin (MINIPRESS) 1 MG capsule Take 1-3 mg by mouth See admin instructions. Take one capsule in the morning and three capsules at bedtime.      Thiamine HCl (THIAMINE PO) Take 1 tablet by mouth daily.      topiramate (TOPAMAX) 25 MG tablet Take 1 tablet (25 mg total) by mouth 2 (two) times daily. 60 tablet 0     Patient Stressors: Financial difficulties   Loss of relationships (recent breakup with boyfriend & friend)   Occupational concerns     Patient Strengths: Ability for insight  Capable of independent living  Communication skills  Physical Health  Supportive family/friends  Work skills   Treatment Modalities: Medication Management, Group therapy, Case management,  1 to 1 session with clinician, Psychoeducation, Recreational therapy.   Physician Treatment Plan for Primary Diagnosis: <principal problem not specified> Long Term Goal(s):     Short Term Goals:    Medication Management: Evaluate patient's response, side effects, and tolerance of medication regimen.  Therapeutic Interventions: 1 to 1 sessions, Unit Group sessions and Medication administration.  Evaluation of Outcomes: Not Met  Physician Treatment Plan for Secondary Diagnosis: Active Problems:   MDD (major depressive disorder), recurrent episode, severe ([place])  Long Term Goal(s):     Short Term Goals:       Medication Management: Evaluate patient's response, side effects, and tolerance of medication regimen.  Therapeutic Interventions: 1 to 1 sessions, Unit Group sessions and Medication administration.  Evaluation of Outcomes: Not Met   RN Treatment Plan for Primary Diagnosis: <principal problem not specified> Long Term Goal(s): Knowledge of disease and therapeutic regimen to maintain health will improve  Short Term Goals: Ability to verbalize feelings will improve, Ability to disclose and discuss suicidal ideas, and Ability to identify and develop effective coping behaviors will improve  Medication Management: RN will administer medications as ordered by provider, will assess and evaluate patient's response and provide education to patient for prescribed medication. RN will report any adverse and/or side effects to prescribing provider.  Therapeutic Interventions: 1 on 1 counseling sessions, Psychoeducation, Medication administration, Evaluate responses to treatment, Monitor vital signs and CBGs as ordered, Perform/monitor CIWA, COWS, AIMS and  Fall Risk screenings as ordered, Perform wound care treatments as ordered.  Evaluation of Outcomes: Not Met   [name] Treatment Plan for Primary Diagnosis: <principal problem not specified> Long Term Goal(s): Safe transition to appropriate next level of care at discharge, Engage patient in therapeutic group addressing interpersonal concerns.  Short Term Goals: Engage patient in aftercare planning with referrals and resources, Increase ability to appropriately verbalize feelings, and Increase emotional regulation  Therapeutic Interventions: Assess for all discharge needs, 1 to 1 time with Social worker, Explore available resources and support systems, Assess for adequacy in community support network, Educate family and significant other(s) on suicide prevention, Complete Psychosocial Assessment, Interpersonal group therapy.  Evaluation of Outcomes: Not Met   Progress in Treatment: Attending groups: Yes. Participating in groups: Yes. Taking medication as prescribed: Yes. Toleration medication: Yes. Family/Significant other contact made:  No, will contact:  CSW will obtain consent  Patient understands diagnosis: No. Discussing patient identified problems/goals with staff: Yes. Medical problems stabilized or resolved: Yes. Denies suicidal/homicidal ideation: Yes. Issues/concerns per patient self-inventory: No. Other: None  New problem(s) identified: No, Describe:  None  New Short Term/Long Term Goal(s):medication stabilization, elimination of SI thoughts, development of comprehensive mental wellness plan.  Patient Goals: "Feel better and apply for disability."    Discharge Plan or Barriers: Patient recently admitted. CSW will continue to follow and assess for appropriate referrals and possible discharge planning.   Reason for Continuation of Hospitalization: Depression Medication stabilization Suicidal ideation  Estimated Length of Stay: 3-5 days   Scribe for Treatment Team: Eliott Nine 03/07/2021 11:51 AM

## 2021-03-07 NOTE — Progress Notes (Signed)
°   03/07/21 2327  Psych Admission Type (Psych Patients Only)  Admission Status Voluntary  Psychosocial Assessment  Patient Complaints Anxiety;Depression  Eye Contact Fair  Facial Expression Anxious  Affect Anxious  Speech Logical/coherent  Interaction Other (Comment) (WNL)  Motor Activity Fidgety  Appearance/Hygiene Excess accessories  Behavior Characteristics Anxious;Cooperative  Mood Anxious  Thought Process  Coherency WDL  Content WDL  Delusions None reported or observed  Perception WDL  Hallucination None reported or observed  Judgment Impaired  Confusion None  Danger to Self  Current suicidal ideation? Denies  Self-Injurious Behavior No self-injurious ideation or behavior indicators observed or expressed   Agreement Not to Harm Self Yes  Description of Agreement verbal contract  Danger to Others  Danger to Others None reported or observed   D: Patient in dayroom interacting with peers. Pt reports feeling anxious during the day but is a lot better. Pt stated she normally picks at her skin when anxious. Chest area where pt has been picking was red but intact.  A: Medications administered as prescribed. Support and encouragement provided as needed.  R: Patient remains safe on the unit. Will continue to monitor for safety and stability.

## 2021-03-08 LAB — LIPID PANEL
Cholesterol: 200 mg/dL (ref 0–200)
HDL: 55 mg/dL (ref >40)
LDL Cholesterol: 117 mg/dL — ABNORMAL HIGH (ref 0–99)
Total CHOL/HDL Ratio: 3.6 RATIO
Triglycerides: 138 mg/dL (ref <150)
VLDL: 28 mg/dL (ref 0–40)

## 2021-03-08 MED ORDER — PANTOPRAZOLE SODIUM 40 MG PO TBEC
40.0000 mg | DELAYED_RELEASE_TABLET | Freq: Two times a day (BID) | ORAL | Status: DC
  Administered 2021-03-08 – 2021-03-10 (×4): 40 mg via ORAL
  Filled 2021-03-08 (×6): qty 1

## 2021-03-08 NOTE — Progress Notes (Signed)
Pt denies SI/HI/AVH.  Said she continues to feel sad throughout the day.  Pt had one episode after lunch where pt started to get loud with staff. Pt is call at this time.
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
## 2021-03-08 NOTE — Group Note (Signed)
[name] Group Therapy Note  No therapy group could be held today due to other needs on the unit that were prioritized.  The patient did not go without group, as another licensed group was held by an Therapist, sports.  Selmer Dominion, [name] 11/23/2020 10:07 AM

## 2021-03-08 NOTE — [facility] Group Notes (Signed)
Psychoeducational Group Note    Date:03/08/21 Time: 1300-1400    Purpose of Group: . The group focus' on teaching patients on how to identify their needs and their Life Skills:  A group where two lists are made. What people need and what are things that we do that are unhealthy. The lists are developed by the patients and it is explained that we often do the actions that are not healthy to get our list of needs met.  Goal:: to develop the coping skills needed to get their needs met  Participation Level:  Active  Participation Quality:  Appropriate  Affect:  Appropriate  Cognitive:  Oriented  Insight:  Improving  Engagement in Group:  Engaged  Additional Comments:  Participated fully in the group.  Paulino Rily

## 2021-03-08 NOTE — [facility] Group Notes (Signed)
Adult Psychoeducational Group Note  Date:  03/08/2021 Time:  9:07 PM  Group Topic/Focus:  Goals Group:   The focus of this group is to help patients establish daily goals to achieve during treatment and discuss how the patient can incorporate goal setting into their daily lives to aide in recovery.  Participation Level:  Minimal  Participation Quality:  Appropriate  Affect:  Appropriate  Cognitive:  Appropriate  Insight: Good  Engagement in Group:  Limited  Modes of Intervention:  Discussion  Additional Comments  Dalene Carrow 03/08/2021, 9:07 PM
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
## 2021-03-08 NOTE — [facility] Group Notes (Signed)
Goals Group 03/08/2021   Group Focus: affirmation, clarity of thought, and goals/reality orientation Treatment Modality:  Psychoeducation Interventions utilized were assignment, group exercise, and support Purpose: To be able to understand and verbalize the reason for their admission to the hospital. To understand that the medication helps with their chemical imbalance but they also need to work on their choices in life. To be challenged to develop a list of 30 positives about themselves. Also introduce the concept that "feelings" are not reality.  Participation Level:  Active  Participation Quality:  Appropriate  Affect:  Appropriate  Cognitive:  Appropriate  Insight:  Improving  Engagement in Group:  Engaged  Additional Comments:  Participated fully in the group.  Paulino Rily

## 2021-03-08 NOTE — Progress Notes (Addendum)
[facility] MD Progress Note  03/08/2021 4:17 PM [name]  MRN: [id number]  CC: suicidal ideation   Subjective: [name] is a 36 y.o. female with PPHx of borderline personality, bipolar 2 disorder, PTSD, multiple past psychiatric hospitalizations, who presented to [facility] 2/1  after expressing at her IOP program that she had suicidal ideation with a plan to slit her wrists with a box cutter. She was then admitted voluntarily to [facility] for management of bipolar 2 disorder, depression.  [place] Day 2   Overnight Events: No acute events overnight or behavioral issues noted in chart. Patient was compliant with scheduled meds, no agitation PRN's required, and attended group therapies appropriately.  Interim History: Patient was evaluated today and patient room on 300 hall. Patient reported feeling " better and less irritable".  Reported today that currently she feels stable, although she has some residual depression, she rates it as 6/10.  She reports stable sleep but requests to move to a medical bed for comfort.  She states that she is frustrated because she cannot get a grilled cheese sandwich and does not feel that the meals here are to her dietary needs or her liking.  Patient then became tearful, stating that she wished she is more deserving of care and friendship.  She continued to ruminate on guilt, loneliness, and her frustration for needing external validation.  She brought up concerns about skin picking, to which she attributes to being anxious. She denied medication side effects and is tolerating it well. Patient denied SI/HI/AVH, delusions, paranoia, first rank symptoms, and contracted for safety on the unit. Patient was not grossly responding to internal/external stimuli nor made any delusional statements during encounter. Otherwise, patient had no other concerns or questions and was amenable to the plan.   Diagnosis: Principal Problem:   Bipolar 2 disorder, major  depressive episode (HCC) Active Problems:   Borderline personality disorder (HCC)   PTSD (post-traumatic stress disorder)  Total Time spent with patient: I personally spent 30 minutes on the unit in direct patient care. The direct patient care time included face-to-face time with the patient, reviewing the patient's chart, communicating with other professionals, and coordinating care. Greater than 50% of this time was spent in counseling or coordinating care with the patient regarding goals of hospitalization, psycho-education, and discharge planning needs.   Past Psychiatric History: See H&P  Past Medical History:  Past Medical History:  Diagnosis Date   Allergy    Anxiety    Asthma    Bipolar 2 disorder ([place])    Borderline personality disorder ([place])    Depression    Eczema    Former smoker    Obesity    PTSD (post-traumatic stress disorder)    Urticaria     Past Surgical History:  Procedure Laterality Date   Thumb surgery Left    Family History:  Family History  Problem Relation Age of Onset   Healthy Mother    Healthy Father    Alzheimer's disease Maternal Grandmother    Skin cancer Paternal Grandmother    Allergic rhinitis Paternal Grandmother    Asthma Paternal Grandmother    Angioedema Neg Hx    Eczema Neg Hx    Urticaria Neg Hx    Family Psychiatric History: See H&P Social History:  Social History   Substance and Sexual Activity  Alcohol Use Yes   Alcohol/week: 8.0 standard drinks   Types: 8 Cans of beer per week   Comment: occ  Social History   Substance and Sexual Activity  Drug Use Yes   Frequency: 4.0 times per week   Types: Marijuana   Comment: Last smoked this morning-03/09/19    Social History   Socioeconomic History   Marital status: Single    Spouse name: Not on file   Number of children: 0   Years of education: 13   Highest education level: Not on file  Occupational History   Not on file  Tobacco Use   Smoking status: Former     Packs/day: 0.50    Years: 9.00    Pack years: 4.50    Types: Cigarettes    Quit date: 12/04/2015    Years since quitting: 5.2   Smokeless tobacco: Never  Vaping Use   Vaping Use: Former  Substance and Sexual Activity   Alcohol use: Yes    Alcohol/week: 8.0 standard drinks    Types: 8 Cans of beer per week    Comment: occ   Drug use: Yes    Frequency: 4.0 times per week    Types: Marijuana    Comment: Last smoked this morning-03/09/19   Sexual activity: Yes    Birth control/protection: Pill  Other Topics Concern   Not on file  Social History Narrative   Fun: Play video games and watch movies.   Denies abuse and feels safe at home.   Social Determinants of Health   Financial Resource Strain: Not on file  Food Insecurity: Not on file  Transportation Needs: Not on file  Physical Activity: Not on file  Stress: Not on file  Social Connections: Not on file   Appetite: Fair  Current Medications: Current Facility-Administered Medications  Medication Dose Route Frequency Provider Last Rate Last Admin   acetaminophen (TYLENOL) tablet 650 mg  650 mg Oral Q6H PRN Lucky Rathke, FNP       albuterol (VENTOLIN HFA) 108 (90 Base) MCG/ACT inhaler 2-4 puff  2-4 puff Inhalation Q6H PRN Lucky Rathke, FNP   4 puff at 03/08/21 1606   alum & mag hydroxide-simeth (MAALOX/MYLANTA) 200-200-20 MG/5ML suspension 30 mL  30 mL Oral Q4H PRN Lucky Rathke, FNP       docusate sodium (COLACE) capsule 100 mg  100 mg Oral Daily PRN Nelda Marseille, Laquesha Holcomb E, MD       gabapentin (NEURONTIN) tablet 600 mg  600 mg Oral TID Lucky Rathke, FNP   600 mg at 03/08/21 1305   hydrOXYzine (ATARAX) tablet 50 mg  50 mg Oral TID Lucky Rathke, FNP   50 mg at 03/08/21 1305   loperamide (IMODIUM) capsule 2 mg  2 mg Oral PRN Corky Sox, MD   2 mg at 03/07/21 1852   LORazepam (ATIVAN) tablet 1 mg  1 mg Oral Q6H PRN Harlow Asa, MD       Lurasidone HCl TABS 120 mg  120 mg Oral Q supper Shylee Durrett E, MD   120 mg at  03/07/21 1812   magnesium hydroxide (MILK OF MAGNESIA) suspension 30 mL  30 mL Oral Daily PRN Lucky Rathke, FNP       montelukast (SINGULAIR) tablet 10 mg  10 mg Oral QHS Lucky Rathke, FNP   10 mg at 03/07/21 2153   multivitamin with minerals tablet 1 tablet  1 tablet Oral Daily Lucky Rathke, FNP   1 tablet at 03/08/21 0939   ondansetron (ZOFRAN-ODT) disintegrating tablet 4 mg  4 mg Oral Q6H PRN Harlow Asa, MD  OXcarbazepine (TRILEPTAL) tablet 150 mg  150 mg Oral BID Nelda Marseille, Deionte Spivack E, MD   150 mg at 03/08/21 0940   pantoprazole (PROTONIX) EC tablet 40 mg  40 mg Oral BID Merrily Brittle, DO       prazosin (MINIPRESS) capsule 1 mg  1 mg Oral Daily Corky Sox, MD   1 mg at 03/08/21 0941   prazosin (MINIPRESS) capsule 3 mg  3 mg Oral QHS Lucky Rathke, FNP   3 mg at 03/07/21 2153   thiamine tablet 100 mg  100 mg Oral Daily Harlow Asa, MD   100 mg at 03/08/21 6378    Lab Results:  No results found for this or any previous visit (from the past 65 hour(s)).  Blood Alcohol level:  Lab Results  Component Value Date   ETH <10 03/05/2021   ETH 176 (H) 58/85/0277    Metabolic Disorder Labs: Lab Results  Component Value Date   HGBA1C 5.5 03/05/2021   MPG 111 03/05/2021   MPG 102.54 12/20/2020   No results found for: PROLACTIN Lab Results  Component Value Date   CHOL 202 (H) 12/20/2020   TRIG 103 12/20/2020   HDL 54 12/20/2020   CHOLHDL 3.7 12/20/2020   VLDL 21 12/20/2020   LDLCALC 127 (H) 12/20/2020   LDLCALC 100 (H) 07/10/2020    Physical Findings:  Musculoskeletal: Strength & Muscle Tone: within normal limits Gait & Station: normal Patient leans: N/A  Psychiatric Specialty Exam: Physical Exam Vitals and nursing note reviewed.  Constitutional:      General: She is awake. She is not in acute distress.    Appearance: She is not ill-appearing, toxic-appearing or diaphoretic.  HENT:     Head: Normocephalic.  Pulmonary:     Effort: Pulmonary effort is  normal. No respiratory distress.  Neurological:     General: No focal deficit present.     Mental Status: She is alert.  Psychiatric:        Behavior: Behavior is cooperative.    Review of Systems  Constitutional:  Negative for diaphoresis.  Respiratory:  Negative for shortness of breath.   Cardiovascular:  Negative for chest pain.  Gastrointestinal:  Negative for abdominal pain.   Body mass index is 40.79 kg/m. Temp:  [97.6 F (36.4 C)-97.9 F (36.6 C)] 97.6 F (36.4 C) (02/04 1613) Pulse Rate:  [77-141] 115 (02/04 1613) Resp:  [18] 18 (02/04 1258) BP: (101-128)/(45-99) 102/79 (02/04 1613) SpO2:  [98 %-100 %] 98 % (02/04 1613)  General Appearance:  Appropriate for Environment; Casual (Unwashed hair)    Eye Contact:   Good    Speech:   Clear and coherent, normal rate    Volume:   Normal    Mood:   Depressed; Hopeless; Anxious (She reported irritability)    Affect:   Congruent; Tearful; Depressed; Constricted    Thought Process:   Linear for most of interview but ruminative at times on psychosocial stressors    Descriptions of Associations: intact  Duration of Psychotic Symptoms:  N/A  Past Diagnosis of Schizophrenia or Psychoactive disorder:  No   Orientation:   Full (Time, Place and Person)    Thought Content:  Ruminations (Negative self talk about worth).  Denied SI/HI/AVH, delusions, paranoia, first rank symptoms, ideas of reference)  Hallucinations: None  Ideas of reference: None    Suicidal Thoughts:   Denied    Homicidal Thoughts:   Denied    Memory:   Immediate Good; Recent Good; Remote Good  Judgement:   Fair    Insight:   Fair    Psychomotor Activity:   Normal (No tremors or restlessness)    Concentration:   Good    Attention Span:   Good   Recall:   Good    Fund of Knowledge:   Good    Language:   Good    Handed:   Right    Assets:   Communication Skills; Desire for Improvement; Leisure Time    ADL's:   Independnet  Cognition:  WNL  Sleep:   Sleep: Fair        CIWA:  CIWA-Ar Total: 3     Treatment Assessment & Plan Summary: Principal Problem:   Bipolar 2 disorder, major depressive episode ([place]) Active Problems:   Borderline personality disorder ([place])   PTSD (post-traumatic stress disorder)   [name] is a 36 y.o. female with PPHx of borderline personality, bipolar 2 disorder, PTSD, multiple previous psychiatric hospitalizations, who presented to [facility] 2/1  after expressing at her IOP program that she had suicidal ideation with a plan to slit her wrists with a box cutter. She was then admitted Voluntary to [facility] for management of bipolar 2 disorder, depression.  [place] Day 2   PLAN: Bipolar II MRE depressed severe without psychotic features Generalized anxiety disorder Borderline personality disorder PTSD -Continue home Latuda 120 mg for mood stability and bipolar depression (give with supper for absorption)             -AIMS and EPS exam to be conducted             -Labs and EKG as below -Discontinue home Topamax given lack of efficacy -Continue Trileptal 150 mg BID for mood stability and impulse control - titrating up as indicated             -r/b/se discussed with patient and she consents to med trial             -Will repeat BMP for Na in a few days             -Will discuss contraception closer to discharge -Continue home prazosin 1 mg AM and 3 mg QHS for nightmares             -Will monitor BP - Continue Neurontin 600mg  tid for anxiety and mood stabilization - Continue Vistaril 50mg  tid - adding Colace PRN for constipation  - Would benefit from DBT after discharge   Alcohol use (r/o alcohol use d/o) Cannabis use (r/o cannabis use d/o) Opiate use d/o in sustained remission - Abstinence encouraged - CIWA protocol ordered for monitoring with thiamine and MVI oral replacement (recent scores 0,3) - Patient reports previous testing for hepatitis and  HIV. [place] to verify in EMR. Patient declines testing.    Medical Management Covid negative CMP: unremarkable except for albumin of 2.9 CBC: unremarkable EtOH: <10 UDS: THC Upreg/B-HCG: negative TSH: normal A1C: normal, BMI of 40 Lipids: pending lab draw  EKG: NSR, Qtc of 439 (2/2)  Dispo: PCP: Biagio Borg, MD Will benefit from referral to alternate psychiatric and therapy providers Attempt to reschedule appt on Monday  Safety, monitoring and disposition planning: The patient was seen and evaluated on the unit.  The patient's chart was reviewed and nursing notes were reviewed.  The patient's case was discussed in multidisciplinary team meeting.  Plan and drug side effects were discussed with patient who was amendable. Social work and case management to assist  with discharge planning and identification of hospital follow-up needs prior to discharge. Discharge Concerns: Need to establish a safety plan; Medication compliance and effectiveness Discharge Goals: Return home with outpatient referrals for mental health follow-up including medication management/psychotherapy Safety and Monitoring: Voluntary status at inpatient psychiatric unit for safety, stabilization and treatment Daily contact with patient to assess and evaluate symptoms and progress in treatment and medical management Patient's case to be discussed in multi-disciplinary team meeting Observation Level: Per above Vital signs:  q12 hours Precautions: Per chart  Signed: Merrily Brittle, DO Psychiatry Resident, PGY-1 [facility] 03/08/2021, 4:17 PM

## 2021-03-08 NOTE — Plan of Care (Signed)
°  Problem: Coping: Goal: Ability to demonstrate self-control will improve Outcome: Progressing   Problem: Education: Goal: Emotional status will improve Outcome: Not Progressing Goal: Mental status will improve Outcome: Not Progressing

## 2021-03-09 LAB — BASIC METABOLIC PANEL
Anion gap: 4 — ABNORMAL LOW (ref 5–15)
BUN: 11 mg/dL (ref 6–20)
CO2: 25 mmol/L (ref 22–32)
Calcium: 8.5 mg/dL — ABNORMAL LOW (ref 8.9–10.3)
Chloride: 108 mmol/L (ref 98–111)
Creatinine, Ser: 0.71 mg/dL (ref 0.44–1.00)
GFR, Estimated: >60 mL/min (ref >60)
Glucose, Bld: 95 mg/dL (ref 70–99)
Potassium: 4.5 mmol/L (ref 3.5–5.1)
Sodium: 137 mmol/L (ref 135–145)

## 2021-03-09 NOTE — BHH Suicide Risk Assessment (Signed)
[place] INPATIENT:  Family/Significant Other Suicide Prevention Education  Suicide Prevention Education:  Education Completed; with friend, [name], [phone], has been identified by the patient as the family member/significant other with whom the patient will be residing, and identified as the person(s) who will aid the patient in the event of a mental health crisis (suicidal ideations/suicide attempt).  With written consent from the patient, the family member/significant other has been provided the following suicide prevention education, prior to the and/or following the discharge of the patient.  The suicide prevention education provided includes the following: Suicide risk factors Suicide prevention and interventions National Suicide Hotline telephone number [facility] assessment telephone number [facility] Emergency Assistance [place] and/or Residential Mobile Crisis Unit telephone number  Request made of family/significant other to: Remove weapons (e.g., guns, rifles, knives), all items previously/currently identified as safety concern.   Remove drugs/medications (over-the-counter, prescriptions, illicit drugs), all items previously/currently identified as a safety concern.  The family member/significant other verbalizes understanding of the suicide prevention education information provided.  The family member/significant other agrees to remove the items of safety concern listed above.  CSW completed SPE with [name]. He states, there are no firearms or weapons in the Pt's home or possession.    [name], MSW, [name]-A 03/09/2021, 2:23 PM

## 2021-03-09 NOTE — BHH Group Notes (Signed)
Adult Psychoeducational Group Not Date:  03/09/2021 Time:  7903-8333 Group Topic/Focus: PROGRESSIVE RELAXATION. A group where deep breathing is taught and tensing and relaxation muscle groups is used. Imagery is used as well.  Pts are asked to imagine 3 pillars that hold them up when they are not able to hold themselves up.  Participation Level:  Did not attend Paulino Rily

## 2021-03-09 NOTE — Progress Notes (Signed)
° ° °   03/09/21 2109  Psych Admission Type (Psych Patients Only)  Admission Status Voluntary  Psychosocial Assessment  Patient Complaints Anxiety  Eye Contact Fair  Facial Expression Anxious  Affect Anxious  Speech Logical/coherent  Interaction Other (Comment) (WDL)  Motor Activity Fidgety;Restless  Appearance/Hygiene Unremarkable  Behavior Characteristics Anxious;Fidgety  Mood Anxious  Thought Process  Coherency WDL  Content WDL  Delusions None reported or observed  Perception WDL  Hallucination None reported or observed  Judgment Impaired  Confusion None  Danger to Self  Current suicidal ideation? Denies  Self-Injurious Behavior No self-injurious ideation or behavior indicators observed or expressed   Agreement Not to Harm Self Yes  Description of Agreement verbal  Danger to Others  Danger to Others None reported or observed

## 2021-03-09 NOTE — Group Note (Signed)
[place] [name] Group Therapy Note  03/09/2021    Type of Therapy and Topic:  Group Therapy:   Processing Significant Event and Introduction to Supporting Self  Participation Level:  Active   Description of Group:  Patients in this group were first given the opportunity to process an event that took place in the prior group during which a fellow group member had an epileptic seizure and other patients had to immediately leave the room, do not know what happened, and will not get news of the person's recovery.  CSW compared that event to having a psychiatric crisis and asked how patients and their loved ones would react the same or differently.  There was a healthy discussion about this.  We then talked about needs for support after discharge, including the fact that we have to (1) know our own diagnosis, (2) be able to describe the symptoms of their illness, (3) be willing to figure out a way to share this with important people in our lives.  A song entitled "My Own Hero" was played which patients said inspired them to realize they must help themselves first and foremost.  WWe also talked about how to put up necessary boundaries to limit unhealthy supports from harming [place].  Therapeutic Goals: 1)  provide support about a difficult event that patients witnessed when another patient had a seizure during group just prior to this group 2)  discuss the differences and similarities between physical breakdowns and mental breakdowns  3)  identify the patient's current level of self-support  4)  elicit thoughtfulness about how to garner additional support from loves ones by providing them education about mental health diagnoses   Summary of Patient Progress:  The patient expressed that one good quality about herself is she can write poetry, and sometimes she likes the poetry she writes.  Her reaction to the significant event that took place was that she wanted to jump in and help and felt helpless because she just had  to leave.  Her thoughts about self-support were very positive and appropriate, and she made a number of meaningful comments throughout group.  The quality of patient's participation was excellent.  Therapeutic Modalities:   Motivational Interviewing Processing  Maretta Los

## 2021-03-09 NOTE — Progress Notes (Addendum)
D: Patient has been attending groups today. Patient has a tendency to get involved with her peer's problems and has been asking for items for her peers. She was told that the patients have to ask for items individually. She states she is sleeping well; her appetite is good. She rates her depression as a 5; hopelessness as a 4; anxiety as a 3. She reports some agitation on her inventory; however, she has not had any behavioral issues today. Her goal today is to "go to groups, meet Dr, and keep getting better." She would like staff to know, "be sensitive with me."   A: Continue to monitor medication management and MD orders.  Safety checks completed every 15 minutes per protocol.  Offer support and encouragement as needed.  R: Patient is receptive to staff; no outbursts noted today.  03/09/21 0900  Psych Admission Type (Psych Patients Only)  Admission Status Voluntary  Psychosocial Assessment  Patient Complaints Anxiety;Depression  Eye Contact Fair  Facial Expression Anxious  Affect Anxious  Speech Logical/coherent  Interaction Other (Comment) (wnl)  Motor Activity Fidgety;Restless  Appearance/Hygiene Unremarkable  Behavior Characteristics Anxious;Fidgety  Mood Anxious  Thought Process  Coherency WDL  Content WDL  Delusions None reported or observed  Perception WDL  Hallucination None reported or observed  Judgment Impaired  Confusion None  Danger to Self  Current suicidal ideation? Denies  Self-Injurious Behavior No self-injurious ideation or behavior indicators observed or expressed   Agreement Not to Harm Self Yes  Description of Agreement verbal  Danger to Others  Danger to Others None reported or observed

## 2021-03-09 NOTE — Plan of Care (Signed)
°  Problem: Education: Goal: Emotional status will improve Outcome: Progressing Goal: Mental status will improve Outcome: Progressing   Problem: Activity: Goal: Interest or engagement in activities will improve Outcome: Progressing   Problem: Coping: Goal: Ability to verbalize frustrations and anger appropriately will improve Outcome: Progressing

## 2021-03-09 NOTE — Progress Notes (Addendum)
[facility] MD Progress Note  03/09/2021 3:16 PM [name]  MRN: [id number]  CC: suicidal ideation   Subjective: [name] is a 36 y.o. female with PPHx of borderline personality, bipolar 2 disorder, PTSD, multiple past psychiatric hospitalizations, who presented to [facility] 2/1  after expressing at her IOP program that she had suicidal ideation with a plan to slit her wrists with a box cutter. She was then admitted voluntarily to [facility] for management of bipolar 2 disorder, depression.  [place] Day 3   Overnight Events: No acute events overnight or behavioral issues noted in chart. Patient was compliant with scheduled meds, no agitation PRN's required, and attended group therapies appropriately.  Interim History: Patient was evaluated today and patient room on 300 hall. The patient appears somewhat anxious, but exhibits a linear and logical thought process.  She is eager to endorse that the medication is helping her.  She says in the past 2 days have been much better than the days leading up to admission.  On discussion with the attending physician, a reduction in dose of the patient's prazosin was discussed given her low blood pressure.  The patient reports feeling ready for discharge tomorrow and says that she was told by the attending physician that she could be discharged tomorrow.  This is incorrect. The patient denies auditory/visual hallucinations and first rank symptoms.  She reports good mood, appetite, and sleep. She denies suicidal and homicidal thoughts. She denies side effects from her medications.  Review of systems as below.   Diagnosis: Principal Problem:   Bipolar 2 disorder, major depressive episode (HCC) Active Problems:   Borderline personality disorder (HCC)   PTSD (post-traumatic stress disorder)  Total Time spent with patient: I personally spent 30 minutes on the unit in direct patient care. The direct patient care time included face-to-face time with the  patient, reviewing the patient's chart, communicating with other professionals, and coordinating care. Greater than 50% of this time was spent in counseling or coordinating care with the patient regarding goals of hospitalization, psycho-education, and discharge planning needs.   Past Psychiatric History: See H&P  Past Medical History:  Past Medical History:  Diagnosis Date   Allergy    Anxiety    Asthma    Bipolar 2 disorder ([place])    Borderline personality disorder ([place])    Depression    Eczema    Former smoker    Obesity    PTSD (post-traumatic stress disorder)    Urticaria     Past Surgical History:  Procedure Laterality Date   Thumb surgery Left    Family History:  Family History  Problem Relation Age of Onset   Healthy Mother    Healthy Father    Alzheimer's disease Maternal Grandmother    Skin cancer Paternal Grandmother    Allergic rhinitis Paternal Grandmother    Asthma Paternal Grandmother    Angioedema Neg Hx    Eczema Neg Hx    Urticaria Neg Hx    Family Psychiatric History: See H&P Social History:  Social History   Substance and Sexual Activity  Alcohol Use Yes   Alcohol/week: 8.0 standard drinks   Types: 8 Cans of beer per week   Comment: occ     Social History   Substance and Sexual Activity  Drug Use Yes   Frequency: 4.0 times per week   Types: Marijuana   Comment: Last smoked this morning-03/09/19    Social History   Socioeconomic History  Marital status: Single    Spouse name: Not on file   Number of children: 0   Years of education: 67   Highest education level: Not on file  Occupational History   Not on file  Tobacco Use   Smoking status: Former    Packs/day: 0.50    Years: 9.00    Pack years: 4.50    Types: Cigarettes    Quit date: 12/04/2015    Years since quitting: 5.2   Smokeless tobacco: Never  Vaping Use   Vaping Use: Former  Substance and Sexual Activity   Alcohol use: Yes    Alcohol/week: 8.0 standard drinks     Types: 8 Cans of beer per week    Comment: occ   Drug use: Yes    Frequency: 4.0 times per week    Types: Marijuana    Comment: Last smoked this morning-03/09/19   Sexual activity: Yes    Birth control/protection: Pill  Other Topics Concern   Not on file  Social History Narrative   Fun: Play video games and watch movies.   Denies abuse and feels safe at home.   Social Determinants of Health   Financial Resource Strain: Not on file  Food Insecurity: Not on file  Transportation Needs: Not on file  Physical Activity: Not on file  Stress: Not on file  Social Connections: Not on file   Appetite: Fair  Current Medications: Current Facility-Administered Medications  Medication Dose Route Frequency Provider Last Rate Last Admin   acetaminophen (TYLENOL) tablet 650 mg  650 mg Oral Q6H PRN Lucky Rathke, FNP       albuterol (VENTOLIN HFA) 108 (90 Base) MCG/ACT inhaler 2-4 puff  2-4 puff Inhalation Q6H PRN Lucky Rathke, FNP   4 puff at 03/08/21 1606   alum & mag hydroxide-simeth (MAALOX/MYLANTA) 200-200-20 MG/5ML suspension 30 mL  30 mL Oral Q4H PRN Lucky Rathke, FNP       docusate sodium (COLACE) capsule 100 mg  100 mg Oral Daily PRN [name], Beverly Harold E, MD       gabapentin (NEURONTIN) tablet 600 mg  600 mg Oral TID Lucky Rathke, FNP   600 mg at 03/09/21 1147   hydrOXYzine (ATARAX) tablet 50 mg  50 mg Oral TID Lucky Rathke, FNP   50 mg at 03/09/21 1147   loperamide (IMODIUM) capsule 2 mg  2 mg Oral PRN Corky Sox, MD   2 mg at 03/07/21 1852   LORazepam (ATIVAN) tablet 1 mg  1 mg Oral Q6H PRN Harlow Asa, MD       Lurasidone HCl TABS 120 mg  120 mg Oral Q supper [name] E, MD   120 mg at 03/08/21 1830   magnesium hydroxide (MILK OF MAGNESIA) suspension 30 mL  30 mL Oral Daily PRN Lucky Rathke, FNP       montelukast (SINGULAIR) tablet 10 mg  10 mg Oral QHS Lucky Rathke, FNP   10 mg at 03/08/21 2150   multivitamin with minerals tablet 1 tablet  1 tablet Oral Daily Lucky Rathke, FNP   1 tablet at 03/09/21 0938   ondansetron (ZOFRAN-ODT) disintegrating tablet 4 mg  4 mg Oral Q6H PRN Harlow Asa, MD       OXcarbazepine (TRILEPTAL) tablet 150 mg  150 mg Oral BID [name], Beverly Halt E, MD   150 mg at 03/09/21 0938   pantoprazole (PROTONIX) EC tablet 40 mg  40 mg Oral BID Alfonse Spruce,  Almyra Free, DO   40 mg at 03/09/21 4287   prazosin (MINIPRESS) capsule 1 mg  1 mg Oral Daily Corky Sox, MD   1 mg at 03/08/21 0941   prazosin (MINIPRESS) capsule 3 mg  3 mg Oral QHS Lucky Rathke, FNP   3 mg at 03/08/21 2150   thiamine tablet 100 mg  100 mg Oral Daily Harlow Asa, MD   100 mg at 03/09/21 6811    Lab Results:  Results for orders placed or performed during the hospital encounter of 03/06/21 (from the past 48 hour(s))  Lipid panel     Status: Abnormal   Collection Time: 03/08/21  6:52 PM  Result Value Ref Range   Cholesterol 200 0 - 200 mg/dL   Triglycerides 138 <150 mg/dL   HDL 55 >40 mg/dL   Total CHOL/HDL Ratio 3.6 RATIO   VLDL 28 0 - 40 mg/dL   LDL Cholesterol 117 (H) 0 - 99 mg/dL    Comment:        Total Cholesterol/HDL:CHD Risk Coronary Heart Disease Risk Table                     Men   Women  1/2 Average Risk   3.4   3.3  Average Risk       5.0   4.4  2 X Average Risk   9.6   7.1  3 X Average Risk  23.4   11.0        Use the calculated Patient Ratio above and the CHD Risk Table to determine the patient's CHD Risk.        ATP III CLASSIFICATION (LDL):  <100     mg/dL   Optimal  100-129  mg/dL   Near or Above                    Optimal  130-159  mg/dL   Borderline  160-189  mg/dL   High  >190     mg/dL   Very High Performed at [facility] [address], [place], [place] [address]     Blood Alcohol level:  Lab Results  Component Value Date   ETH <10 03/05/2021   ETH 176 (H) 03/55/9741    Metabolic Disorder Labs: Lab Results  Component Value Date   HGBA1C 5.5 03/05/2021   MPG 111 03/05/2021   MPG 102.54  12/20/2020   No results found for: PROLACTIN Lab Results  Component Value Date   CHOL 200 03/08/2021   TRIG 138 03/08/2021   HDL 55 03/08/2021   CHOLHDL 3.6 03/08/2021   VLDL 28 03/08/2021   LDLCALC 117 (H) 03/08/2021   LDLCALC 127 (H) 12/20/2020    Physical Findings:  Musculoskeletal: Strength & Muscle Tone: within normal limits Gait & Station: normal Patient leans: N/A  Psychiatric Specialty Exam: Physical Exam Vitals and nursing note reviewed.  Constitutional:      General: She is awake. She is not in acute distress.    Appearance: She is not ill-appearing, toxic-appearing or diaphoretic.  HENT:     Head: Normocephalic.  Pulmonary:     Effort: Pulmonary effort is normal. No respiratory distress.  Neurological:     General: No focal deficit present.     Mental Status: She is alert.  Psychiatric:        Behavior: Behavior is cooperative.    Review of Systems  Constitutional:  Negative for diaphoresis.  Respiratory:  Negative for  shortness of breath.   Cardiovascular:  Negative for chest pain.  Gastrointestinal:  Negative for abdominal pain.   Body mass index is 40.79 kg/m. Temp:  [97.5 F (36.4 C)-97.6 F (36.4 C)] 97.5 F (36.4 C) (02/05 0636) Pulse Rate:  [77-123] 91 (02/05 1100) Resp:  [18] 18 (02/05 1100) BP: (96-111)/(51-79) 96/68 (02/05 1100) SpO2:  [98 %-100 %] 100 % (02/05 1100)  General Appearance:  Appropriate for Environment; Casual (Unwashed hair)    Eye Contact:   Good    Speech:   Clear and coherent, normal rate    Volume:   Normal    Mood:   "Great"   Affect:   Anxious, overbearing and assertive at times  Thought Process:   Linear for most of interview but perseverative at times on discharge and medications   Descriptions of Associations: intact  Duration of Psychotic Symptoms:  N/A  Past Diagnosis of Schizophrenia or Psychoactive disorder:  No   Orientation:   Full (Time, Place and Person)    Thought Content:  Patient  denied SI/HI/AVH, delusions, paranoia, first rank symptoms. Patient is not grossly responding to internal/external stimuli on exam and did not make delusional statements.   Hallucinations: None  Ideas of reference: None    Suicidal Thoughts:   Denied (last reported on 2/3)    Homicidal Thoughts:   Denied    Memory:   Immediate Good; Recent Good; Remote Good    Judgement:   Fair    Insight:   Fair    Psychomotor Activity:   Normal (No tremors or restlessness)    Concentration:   Good    Attention Span:   Good   Recall:   Good    Fund of Knowledge:   Good    Language:   Good    Handed:   Right    Assets:   Communication Skills; Desire for Improvement; Leisure Time    ADL's:  Independnet  Cognition:  WNL  Sleep:   Sleep: Fair        CIWA:  CIWA-Ar Total: 3     Treatment Assessment & Plan Summary: Principal Problem:   Bipolar 2 disorder, major depressive episode ([place]) Active Problems:   Borderline personality disorder ([place])   PTSD (post-traumatic stress disorder)   [name] is a 36 y.o. female with PPHx of borderline personality, bipolar 2 disorder, PTSD, multiple previous psychiatric hospitalizations, who presented to [facility] 2/1  after expressing at her IOP program that she had suicidal ideation with a plan to slit her wrists with a box cutter. She was then admitted Voluntary to [facility] for management of bipolar 2 disorder, depression.  [place] Day 3   PLAN: Bipolar II MRE depressed severe without psychotic features Generalized anxiety disorder Borderline personality disorder PTSD -Continue home Latuda 120 mg for mood stability and bipolar depression (give with supper for absorption)             -AIMS and EPS exam negative 2/4             -Labs and EKG as below -Discontinue home Topamax given lack of efficacy -Continue Trileptal 150 mg BID for mood stability and impulse control             -r/b/se discussed with patient and  she consents to med trial             -Will repeat BMP tomorrow for Na+ monitoring on med             -  Will discuss contraception closer to discharge -Continue reduce prazosin to  3 mg QHS for nightmares and stop daytime dose due to BP drops             -Will monitor BP - Continue Neurontin 600mg  tid for anxiety and mood stabilization - Continue Vistaril 50mg  tid - adding Colace PRN for constipation  - Would benefit from DBT after discharge   Alcohol use (r/o alcohol use d/o) Cannabis use (r/o cannabis use d/o) Opiate use d/o in sustained remission - Abstinence encouraged - CIWA protocol ordered for monitoring with thiamine and MVI oral replacement (recent score of 3) - Patient reports previous testing for hepatitis and HIV. Unable to verify in EMR. Patient declines testing.    Medical Management Covid negative CMP: unremarkable except for albumin of 2.9 CBC: unremarkable EtOH: <10 UDS: THC Upreg/B-HCG: negative TSH: normal A1C: normal, BMI of 40 Lipids: LDL of 117, otherwise WNL EKG: NSR, Qtc of 439 (2/2)  Dispo: PCP: Biagio Borg, MD Will benefit from referral to alternate psychiatric and therapy providers Attempt to reschedule appt on Monday  Safety, monitoring and disposition planning: The patient was seen and evaluated on the unit.  The patient's chart was reviewed and nursing notes were reviewed.  The patient's case was discussed in multidisciplinary team meeting.  Plan and drug side effects were discussed with patient who was amendable. Social work and case management to assist with discharge planning and identification of hospital follow-up needs prior to discharge. Discharge Concerns: Need to establish a safety plan; Medication compliance and effectiveness Discharge Goals: Return home with outpatient referrals for mental health follow-up including medication management/psychotherapy Safety and Monitoring: Voluntary status at inpatient psychiatric unit for safety,  stabilization and treatment Daily contact with patient to assess and evaluate symptoms and progress in treatment and medical management Patient's case to be discussed in multi-disciplinary team meeting Observation Level: Per above Vital signs:  q12 hours Precautions: Per chart  Signed: Corky Sox, MD PGY-1

## 2021-03-09 NOTE — BHH Group Notes (Signed)
Psychoeducational Group Note  Date:  03/09/2021 Time:  1300-1400   Group Topic/Focus: This is a continuation of the group from Saturday. Pt's have been asked to formulate a list of 30 positives about themselves. This list is to be read 2 times a day for 30 days, looking in a mirror. Changing patterns of negative self talk. Also discussed is the fact that there have been some people who hurt [place] in the past. We keep that memory alive within [place]. Ways to cope with this are discused   Participation Level:  Active  Participation Quality:  Appropriate  Affect:  Appropriate  Cognitive:  Oriented  Insight: Improving  Engagement in Group:  Engaged  Modes of Intervention:  Activity, Discussion, Education, and Support  Additional Comments:  Rates her energy at an 8/10. Participated fully in the group.  [name]

## 2021-03-10 DIAGNOSIS — F3181 Bipolar II disorder: Principal | ICD-10-CM

## 2021-03-10 MED ORDER — PRAZOSIN HCL 1 MG PO CAPS: 3.0000 mg | ORAL_CAPSULE | Freq: Every day | ORAL | Status: DC

## 2021-03-10 MED ORDER — OXCARBAZEPINE 150 MG PO TABS: 150.0000 mg | ORAL_TABLET | Freq: Two times a day (BID) | ORAL | 0 refills | Status: DC

## 2021-03-10 NOTE — Progress Notes (Signed)
°  [facility] Adult Case Management Discharge Plan :  Will you be returning to the same living situation after discharge:  Yes,  home with room mate At discharge, do you have transportation home?: Yes,  via room mate Do you have the ability to pay for your medications: Yes,  private insurance  Release of information consent forms completed and sent to medical records;  Patient's signature obtained.   Patient to Follow up at:  Follow-up [facility] Follow up on 03/26/2021.   Specialty: Behavioral Health Why: You have an appointment for medication management services 03/26/21 at 11:00 am with Dr. De Nurse.  This will be a Virtual appointment via your MyChart account. Contact information: [place] [phone]        Margaretmary Lombard, PhD. Go on 03/18/2021.   Specialty: Psychology Why: You have an appointment on 03/18/21 at 11:00 am for therapy services.  Please call to confirm this appointment. Contact information: [place] [address] [place]. Go on 03/12/2021.   Specialty: Behavioral Health Why: You have an appointment with this provider on 03/12/21 at 9:30 am.  This appointment will be held in person. Contact information: [place] [id number] [place] [phone]                Next level of care provider has access to [place] and Suicide Prevention discussed: Yes,  friend/room mate     Has patient been referred to the Quitline?: N/A patient is not a smoker  Patient has been referred for addiction treatment: [place], [place] 03/10/2021, 10:11 AM

## 2021-03-10 NOTE — BHH Group Notes (Signed)
Adult Psychoeducational Group Note  Date:  03/10/2021 Time:  10:18 AM  Group Topic/Focus:  Goals Group:   The focus of this group is to help patients establish daily goals to achieve during treatment and discuss how the patient can incorporate goal setting into their daily lives to aide in recovery.  Participation Level:  Active  Participation Quality:  Appropriate  Affect:  Appropriate  Cognitive:  Appropriate  Insight: Appropriate  Engagement in Group:  Engaged  Modes of Intervention:  Discussion  Additional Comments:  Patient attended morning orientation/goal group and participated.   Lark Runk W Natayla Cadenhead 03/13/5745, 10:18 AM
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
## 2021-03-10 NOTE — Group Note (Signed)
Recreation Therapy Group Note   Group Topic:Stress Management  Group Date: 03/10/2021 Start Time: 0930 End Time: 0950 Facilitators: Vikki Ports, NT Location: [address] Dayroom   Goal Area(s) Addresses:  Patient will actively participate in stress management techniques presented during session.  Patient will successfully identify benefit of practicing stress management post d/c.   Group Description: Guided Imagery. LRT provided education, instruction, and demonstration on practice of visualization via guided imagery. Patient was asked to participate in the technique introduced during session. LRT debriefed including topics of mindfulness, stress management and specific scenarios each patient could use these techniques. Patients were given suggestions of ways to access scripts post d/c and encouraged to explore Youtube and other apps available on smartphones, tablets, and computers.   Affect/Mood: Appropriate   Participation Level: Active   Participation Quality: Independent   Behavior: Appropriate   Speech/Thought Process: Focused   Insight: Good   Judgement: Good   Modes of Intervention: Script, Ambient Sounds   Patient Response to Interventions:  Attentive   Education Outcome:  Acknowledges education and In group clarification offered    Clinical Observations/Individualized Feedback: Pt attended and participated in group activity.    Plan: Continue to engage patient in RT group sessions 2-3x/week.   Victorino Sparrow, LRT,CTRS  03/10/2021 11:01 AM
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
## 2021-03-10 NOTE — Progress Notes (Signed)
Pt completed survey. RN met with pt and reviewed pt's discharge instructions. Pt verbalized understanding of discharge instructions and pt did not have any questions. RN reviewed and provided pt with a copy of SRA, AVS and Transition Record. RN returned pt's belongings to pt. Pt denied SI/HI/AVH and voiced no concerns. Pt was appreciative of the care pt received at [facility]. Patient discharged to the lobby without incident.   03/10/21 0801  Psych Admission Type (Psych Patients Only)  Admission Status Voluntary  Psychosocial Assessment  Patient Complaints None  Eye Contact Fair  Facial Expression Animated  Affect Appropriate to circumstance  Speech Logical/coherent  Interaction Assertive  Motor Activity Other (Comment) (WDL)  Appearance/Hygiene Unremarkable  Behavior Characteristics Cooperative;Appropriate to situation;Calm  Mood Pleasant  Thought Process  Coherency WDL  Content WDL  Delusions None reported or observed  Perception WDL  Hallucination None reported or observed  Judgment Impaired  Confusion None  Danger to Self  Current suicidal ideation? Denies  Self-Injurious Behavior No self-injurious ideation or behavior indicators observed or expressed   Agreement Not to Harm Self Yes  Description of Agreement verbal  Danger to Others  Danger to Others None reported or observed

## 2021-03-11 ENCOUNTER — Other Ambulatory Visit: Payer: Self-pay

## 2021-03-11 ENCOUNTER — Ambulatory Visit (INDEPENDENT_AMBULATORY_CARE_PROVIDER_SITE_OTHER): Payer: 59 | Admitting: Allergy & Immunology

## 2021-03-11 ENCOUNTER — Encounter: Payer: Self-pay | Admitting: Allergy & Immunology

## 2021-03-11 ENCOUNTER — Telehealth (HOSPITAL_COMMUNITY): Payer: Self-pay | Admitting: Licensed Clinical Social Worker

## 2021-03-11 VITALS — BP 108/70 | HR 122 | Temp 98.0°F | Resp 20 | Ht 63.5 in | Wt 229.0 lb

## 2021-03-11 DIAGNOSIS — J3089 Other allergic rhinitis: Secondary | ICD-10-CM

## 2021-03-11 DIAGNOSIS — J4540 Moderate persistent asthma, uncomplicated: Secondary | ICD-10-CM | POA: Diagnosis not present

## 2021-03-11 DIAGNOSIS — J302 Other seasonal allergic rhinitis: Secondary | ICD-10-CM

## 2021-03-11 MED ORDER — ALBUTEROL SULFATE HFA 108 (90 BASE) MCG/ACT IN AERS
2.0000 | INHALATION_SPRAY | Freq: Four times a day (QID) | RESPIRATORY_TRACT | 2 refills | Status: DC | PRN
Start: 2021-03-11 — End: 2022-02-05

## 2021-03-11 MED ORDER — MONTELUKAST SODIUM 10 MG PO TABS: 10.0000 mg | ORAL_TABLET | Freq: Every day | ORAL | 5 refills | Status: DC

## 2021-03-11 MED ORDER — BUDESONIDE-FORMOTEROL FUMARATE 160-4.5 MCG/ACT IN AERO: 2.0000 | INHALATION_SPRAY | Freq: Two times a day (BID) | RESPIRATORY_TRACT | 5 refills | Status: DC

## 2021-03-11 MED ORDER — HYDROXYZINE PAMOATE 50 MG PO CAPS: 50.0000 mg | ORAL_CAPSULE | Freq: Two times a day (BID) | ORAL | 5 refills | Status: AC | PRN

## 2021-03-11 MED ORDER — OMEPRAZOLE 40 MG PO CPDR: 40.0000 mg | DELAYED_RELEASE_CAPSULE | Freq: Two times a day (BID) | ORAL | 5 refills | Status: DC

## 2021-03-11 MED ORDER — FLUTICASONE PROPIONATE 50 MCG/ACT NA SUSP: 2.0000 | Freq: Every day | NASAL | 5 refills | Status: DC
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
## 2021-03-11 NOTE — Patient Instructions (Addendum)
1. Moderate persistent asthma, uncomplicated - Continue with omeprazole to 40mg  twice daily to help with GERD.  - We are going to get you back on the Symbicort and use this consistently. - We may start [place] at the next visit if there is not enough improvement in your symptoms.  - Daily controller medication(s): Singulair 10mg  daily and Symbicort 160/4.72mcg two puffs twice daily with spacer - Prior to physical activity: ProAir 2 puffs 10-15 minutes before physical activity. - Rescue medications: ProAir 4 puffs every 4-6 hours as needed - Asthma control goals:  * Full participation in all desired activities (may need albuterol before activity) * Albuterol use two time or less a week on average (not counting use with activity) * Cough interfering with sleep two time or less a month * Oral steroids no more than once a year * No hospitalizations  2. Seasonal and perennial allergic rhinitis (dust mites, cats, dogs, cockroach, trees, weeds, grass, mouse, and horse) - We do not do home shots, unfortunately, but Dupixent might help with the rhinitis symptoms. - Continue with: Hydroxyzine one tablet twice daily, Singulair (montelukast) 10mg  daily, Flonase (fluticasone) two sprays per nostril daily - You can use an extra dose of the antihistamine, if needed, for breakthrough symptoms.  - Continue with nasal saline rinses as needed.    3. Eczema - Continue with the hydrocortisone as needed.   4. Return in about 3 months (around 06/08/2021).    Please inform us of any Emergency Department visits, hospitalizations, or changes in symptoms. Call us before going to the ED for breathing or allergy symptoms since we might be able to fit you in for a sick visit. Feel free to contact us anytime with any questions, problems, or concerns.  It was a pleasure to see you again today!  Websites that have reliable patient information: 1. American Academy of Asthma, Allergy, and Immunology: www.aaaai.org 2.  Food Allergy Research and Education (FARE): foodallergy.org 3. Mothers of Asthmatics: http://www.asthmacommunitynetwork.org 4. American College of Allergy, Asthma, and Immunology: www.acaai.org   COVID-19 Vaccine Information can be found at: ShippingScam.co.uk For questions related to vaccine distribution or appointments, please email vaccine@[place] .com or call [phone].     Like [place] on [place] and Instagram for our latest updates!       Make sure you are registered to vote! If you have moved or changed any of your contact information, you will need to get this updated before voting!  In some cases, you MAY be able to register to vote online: [email or web address]

## 2021-03-12 ENCOUNTER — Ambulatory Visit (HOSPITAL_COMMUNITY): Payer: 59

## 2021-03-12 ENCOUNTER — Ambulatory Visit (HOSPITAL_COMMUNITY): Payer: 59 | Admitting: Licensed Clinical Social Worker

## 2021-03-13 ENCOUNTER — Ambulatory Visit (HOSPITAL_COMMUNITY): Payer: 59

## 2021-03-13 ENCOUNTER — Ambulatory Visit: Payer: 59 | Admitting: Allergy & Immunology

## 2021-03-18 ENCOUNTER — Ambulatory Visit (HOSPITAL_COMMUNITY): Payer: 59

## 2021-03-19 ENCOUNTER — Ambulatory Visit (HOSPITAL_COMMUNITY): Payer: 59

## 2021-03-20 ENCOUNTER — Ambulatory Visit (HOSPITAL_COMMUNITY): Payer: 59

## 2021-03-24 ENCOUNTER — Telehealth (HOSPITAL_COMMUNITY): Payer: Self-pay | Admitting: Internal Medicine

## 2021-03-24 NOTE — BH Assessment (Signed)
Care Management - Follow Up [facility] Discharges   Patient has been placed in an inpatient psychiatric hospital ([facility]) on 03-06-2021

## 2021-03-26 ENCOUNTER — Encounter (HOSPITAL_COMMUNITY): Payer: Self-pay | Admitting: Psychiatry

## 2021-03-26 ENCOUNTER — Telehealth (INDEPENDENT_AMBULATORY_CARE_PROVIDER_SITE_OTHER): Payer: 59 | Admitting: Psychiatry

## 2021-03-26 ENCOUNTER — Telehealth (HOSPITAL_COMMUNITY): Payer: Self-pay | Admitting: Psychiatry

## 2021-03-26 DIAGNOSIS — F603 Borderline personality disorder: Secondary | ICD-10-CM

## 2021-03-26 DIAGNOSIS — F3181 Bipolar II disorder: Secondary | ICD-10-CM | POA: Diagnosis not present

## 2021-03-26 DIAGNOSIS — F419 Anxiety disorder, unspecified: Secondary | ICD-10-CM | POA: Diagnosis not present

## 2021-03-26 DIAGNOSIS — F4310 Post-traumatic stress disorder, unspecified: Secondary | ICD-10-CM

## 2021-03-26 MED ORDER — LATUDA 120 MG PO TABS: 120.0000 mg | ORAL_TABLET | Freq: Every day | ORAL | 0 refills | Status: DC

## 2021-03-26 MED ORDER — PRAZOSIN HCL 1 MG PO CAPS: 3.0000 mg | ORAL_CAPSULE | Freq: Every day | ORAL | Status: DC

## 2021-03-26 MED ORDER — OXCARBAZEPINE 150 MG PO TABS
150.0000 mg | ORAL_TABLET | Freq: Two times a day (BID) | ORAL | 0 refills | Status: DC
Start: 2021-03-26 — End: 2021-04-21

## 2021-03-26 MED ORDER — GABAPENTIN 600 MG PO TABS: 600.0000 mg | ORAL_TABLET | Freq: Three times a day (TID) | ORAL | 0 refills | Status: DC

## 2021-03-26 NOTE — Telephone Encounter (Signed)
D:  Dr. De Nurse referred pt to IOP.  A:  Placed call to discuss with patient.  Apparently, discharge summary states pt was referred to [facility]-IOP; pt states she doesn't really know why.  Admits to drinking two beers about three times per week.  Most recent two beers were 4-5 days ago.  Pt inquired which program has three day attendance?  Informed pt that CD-IOP meets in person three days a week; but [facility]-IOP meets five days per week.  "It may be hard for me to attend five days; that will be a lot."  Informed pt the case manager will discuss case with the treatment team Ricky Ala, NP and Shade Flood, [name]) and call her tomorrow.  Will also, discuss with Beather Arbour, RN (clinical mgr) the appropriate level of care considering pt was released from [facility] recently.
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
## 2021-03-27 ENCOUNTER — Telehealth (HOSPITAL_COMMUNITY): Payer: Self-pay | Admitting: Psychiatry

## 2021-03-27 ENCOUNTER — Telehealth (HOSPITAL_COMMUNITY): Payer: Self-pay | Admitting: Licensed Clinical Social Worker

## 2021-03-27 NOTE — Telephone Encounter (Signed)
The therapist staffed this case this morning with Dellia Nims who is the Case Manager with the [facility] IOP. She indicated that the client was IP recently and that the recommended LOC was PHP; however, the client does not want to attend 5 days per week. She saw a new Psychiatrist today who apparently attempted to refer the client for [place] IOP; however, Ms. Carlis Abbott notes that the client was scheduled previously with Ms. Micheline Chapman for an assessment for [facility] IOP; however, no showed. This client has reportedly had the same therapist since the age of 13 and at one point was seeing a therapist at this office while failing to disclose that she was already being seen by another therapist. Ms. Carlis Abbott says that she is not sure if the psychiatrist knows that this client has had the same therapist since the age of 40. This therapist suggests that it would seem appropriate to ask this client to sign a ROI for her current therapist and to see if the therapist is aware that the client is seeking a HLOC and to get her impressions regarding what the appropriate LOC might be and to determine if this therapist is aware of the client's co-occurring substance use. Ms. Carlis Abbott notes that this client has a history of engaging in some apparent splitting behavior and has been diagnosed with Borderline Personality Disorder.   This therapist receives a voicemail from the client this afternoon saying that she wants to schedule an assessment and that she spoke to Ms. Clark about this today. The therapist is unable to locate a note regarding a telephone contact with Ms. Carlis Abbott for today seeing one only from yesterday so emails Ms. Clark to inquire as to whether or not she spoke to this client today; and if so, what the disposition of this call was.   [name], MA, [name], [place], LCAS

## 2021-03-27 NOTE — Telephone Encounter (Signed)
Late entry:  03-27-21 @ 1330   D:  Placed call to inform pt that MH-IOP treatment team Ricky Ala, NP, Shade Flood, [name] and the case mgr) met and decided that pt needed to f/u with the discharge plan as mentioned by [facility] unit upon pt's discharge.  The discharging provider had referred pt to CD-IOP and pt didn't follow through.  Furthermore, the correct level of care wouldn't be MH-IOP after one has been discharged recently form an inpatient unit.  Pt states she is fine with the disposition and requested the group leader's name/# [employer], [place]).  Pt mentioned she would reach out to him today.  A:  Provided pt with Bill's phone #.  Informed Ricky Ala, NP.  Sent email to Dr. De Nurse informing him of MH-IOP team's decision.  Earlier had mentioned referral to [employer], [name], LCAS.  R:  Pt receptive.

## 2021-03-28 ENCOUNTER — Telehealth (HOSPITAL_COMMUNITY): Payer: Self-pay | Admitting: Licensed Clinical Social Worker

## 2021-03-28 NOTE — Telephone Encounter (Signed)
Beverly therapist receives a return call from [name] and verifies her identify for privacy having her verify her full name, DOB, and address on file.   Beverly therapist explains to her Beverly components of [name] and notes that her discharge note indicated that she was to return to her previous [name] at this office; however, for reasons that are unclear, that she was setup to see Beverly therapist over [name].  [name] says that this was confusing to her as well as she does not view substance use as being her "main issues." She says that she spoke with [name] from Beverly [name] and was under Beverly impression that [name] was Beverly only available option to her with this therapist explaining that this is not Beverly case.   This therapist explains that Beverly recommendation back in November of last year after her 3rd, IP admission was [name] and not [name]. She says that [name] told her that if she were to be admitted again to Beverly [name] that it would have to be five days a week vs. three days so if she is going to attend five days a week that she would be agreeable to attending Beverly [name]; however, wants to know if she can attend virtually as she did with Beverly [name].   This therapist informs her that he will reach out to [name] over [name] and call [name] back either this afternoon or Monday, 03/31/21, after noon. Beverly therapist sends Beverly following email to [name]:  "[name], [name], [name], Beverly again Abita [name],     I tried reaching you at your extension; however, received no answer and it did not go to voicemail. I know that you are likely facilitating Beverly [name] per Beverly schedule for [name] noted on Beverly flyer that I have.     I spoke today with [name] MRN [id number] who you tried to reach back in November I believe, and she is interested in attending Beverly [name] having had another IP treatment episode 2 weeks ago. She was in Beverly [name] and due to thoughts of killing herself with a box cutter was sent  to Beverly IP facility.     Beverly discharge note indicated that her aftercare plan was to return to her previous [name]; however, later in Beverly note; they set her up with [name] for [name] which [name] was confused about. Not understanding [name] appointment, she did not show. At present, [name] and [name] indicated that she is not appropriate to return to [name] perhaps due to her acuity level as she has had four hospitalizations in a Beverly over a year and overdosed on Zyprexa at Beverly end of last year.      Based on her history, I did a LOCUS and ASAM which would seem to suggest that Beverly [name] would be Beverly more appropriate LOC at this time. She says that she would like to attend [name]; however, asked if she would be able to do so virtually noting that bus fare to and from [name] may be cost-prohibitive.      She says that she drinks a couple of beers per week; however, has apparently drunk more in Beverly past; and she uses Delta-8 which is legal but does give a mild, THC high. She views her psychiatric issues i.e. Borderline Personality, PTSD, and Bipolar II Disorder as primary and has no desire to attend an abstinence-based [name] that  is unable to address her extreme, Beverly co-morbidities such as a Seeking Safety Group, DBT, etc.      I told her that I would touch base with you and call her back either today or Monday afternoon. Her contact number is  [phone] in case you would want it.      She has an OP Psychiatrist, Dr. Larena Glassman, and an OP therapist, Dr. Margaretmary Lombard. She noted that Dr. Joaquin Music was supposed to reach out to [name]; however, apparently did not do so. When I asked [name] if a Release of Information was in place for Dr. Joaquin Music, she said that she did not believe that we had one on file. Thus, I explained to [name] that if she were to attend [name] that we would need to obtain Releases of Information for her OP [name] providers so as to be able to obtain history and coordination of care.       Thanks,      [name], MA, [name], [name], [name] [phone]  Fax- [phone]"   [name], MA, [name], [facility], [place]

## 2021-03-31 ENCOUNTER — Telehealth (HOSPITAL_COMMUNITY): Payer: Self-pay | Admitting: Psychiatry

## 2021-03-31 ENCOUNTER — Telehealth (HOSPITAL_COMMUNITY): Payer: Self-pay | Admitting: Licensed Clinical Social Worker

## 2021-03-31 NOTE — Telephone Encounter (Addendum)
D:  Pt's therapist (Dr. Margaretmary Lombard) called and left vm that pt is wanting to return to group.  "Hena told me that she really liked the groups and wants to return." A:  Due to case manager not having a ROI on file for Dr. Joaquin Music, she couldn't call and provide her with any information.  Will inform treatment team and Beather Arbour, RN (clinical mgr).

## 2021-04-01 ENCOUNTER — Other Ambulatory Visit: Payer: Self-pay

## 2021-04-01 ENCOUNTER — Other Ambulatory Visit (HOSPITAL_COMMUNITY): Payer: 59

## 2021-04-01 ENCOUNTER — Telehealth (HOSPITAL_COMMUNITY): Payer: Self-pay | Admitting: Licensed Clinical Social Worker

## 2021-04-21 ENCOUNTER — Other Ambulatory Visit (HOSPITAL_COMMUNITY): Payer: Self-pay

## 2021-04-21 MED ORDER — OXCARBAZEPINE 150 MG PO TABS: 150.0000 mg | ORAL_TABLET | Freq: Two times a day (BID) | ORAL | 0 refills | Status: DC

## 2021-05-05 ENCOUNTER — Telehealth (HOSPITAL_COMMUNITY): Payer: 59 | Admitting: Psychiatry

## 2021-05-21 ENCOUNTER — Encounter (HOSPITAL_COMMUNITY): Payer: Self-pay | Admitting: Psychiatry

## 2021-05-21 ENCOUNTER — Telehealth (INDEPENDENT_AMBULATORY_CARE_PROVIDER_SITE_OTHER): Payer: 59 | Admitting: Psychiatry

## 2021-05-21 DIAGNOSIS — F419 Anxiety disorder, unspecified: Secondary | ICD-10-CM | POA: Diagnosis not present

## 2021-05-21 DIAGNOSIS — F603 Borderline personality disorder: Secondary | ICD-10-CM

## 2021-05-21 DIAGNOSIS — F3181 Bipolar II disorder: Secondary | ICD-10-CM | POA: Diagnosis not present

## 2021-05-21 DIAGNOSIS — F4310 Post-traumatic stress disorder, unspecified: Secondary | ICD-10-CM | POA: Diagnosis not present

## 2021-05-21 MED ORDER — GABAPENTIN 600 MG PO TABS
600.0000 mg | ORAL_TABLET | Freq: Three times a day (TID) | ORAL | 0 refills | Status: DC
Start: 2021-05-21 — End: 2021-07-30

## 2021-05-21 MED ORDER — PRAZOSIN HCL 1 MG PO CAPS: 3.0000 mg | ORAL_CAPSULE | Freq: Every day | ORAL | Status: DC

## 2021-05-21 MED ORDER — LATUDA 120 MG PO TABS: 120.0000 mg | ORAL_TABLET | Freq: Every day | ORAL | 0 refills | Status: DC

## 2021-05-21 MED ORDER — HYDROXYZINE PAMOATE 50 MG PO CAPS: 50.0000 mg | ORAL_CAPSULE | Freq: Two times a day (BID) | ORAL | 1 refills | Status: DC | PRN

## 2021-05-21 MED ORDER — OXCARBAZEPINE 150 MG PO TABS: 150.0000 mg | ORAL_TABLET | Freq: Two times a day (BID) | ORAL | 0 refills | Status: DC
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
## 2021-06-16 ENCOUNTER — Encounter: Payer: Self-pay | Admitting: Surgery

## 2021-06-17 ENCOUNTER — Telehealth (HOSPITAL_COMMUNITY): Payer: Self-pay

## 2021-06-17 MED ORDER — LATUDA 120 MG PO TABS: 120.0000 mg | ORAL_TABLET | Freq: Every day | ORAL | 0 refills | Status: DC

## 2021-06-17 NOTE — Telephone Encounter (Signed)
Medication management - Maudie Mercury, pharmacist from [facility] a called requesting Dr. De Nurse send them a new order for generic Latuda as patient is okay with taking the generic if provider agrees and that is what they have in and is more cost effective for patient.  Pharmacist stated they would need a new order that calls for the generic to fill if provider agrees patient can take the generic form.  ?

## 2021-07-16 ENCOUNTER — Other Ambulatory Visit (HOSPITAL_COMMUNITY): Payer: Self-pay

## 2021-07-16 MED ORDER — LATUDA 120 MG PO TABS: 120.0000 mg | ORAL_TABLET | Freq: Every day | ORAL | 0 refills | Status: DC

## 2021-07-30 ENCOUNTER — Encounter (HOSPITAL_COMMUNITY): Payer: Self-pay | Admitting: Psychiatry

## 2021-07-30 ENCOUNTER — Telehealth (INDEPENDENT_AMBULATORY_CARE_PROVIDER_SITE_OTHER): Payer: 59 | Admitting: Psychiatry

## 2021-07-30 DIAGNOSIS — F4310 Post-traumatic stress disorder, unspecified: Secondary | ICD-10-CM

## 2021-07-30 DIAGNOSIS — F419 Anxiety disorder, unspecified: Secondary | ICD-10-CM | POA: Diagnosis not present

## 2021-07-30 DIAGNOSIS — F603 Borderline personality disorder: Secondary | ICD-10-CM | POA: Diagnosis not present

## 2021-07-30 DIAGNOSIS — F3181 Bipolar II disorder: Secondary | ICD-10-CM

## 2021-07-30 MED ORDER — LATUDA 120 MG PO TABS: 120.0000 mg | ORAL_TABLET | Freq: Every day | ORAL | 0 refills | Status: DC

## 2021-07-30 MED ORDER — GABAPENTIN 600 MG PO TABS
600.0000 mg | ORAL_TABLET | Freq: Three times a day (TID) | ORAL | 1 refills | Status: DC
Start: 2021-07-30 — End: 2021-09-22

## 2021-07-30 MED ORDER — OXCARBAZEPINE 300 MG PO TABS: 300.0000 mg | ORAL_TABLET | Freq: Two times a day (BID) | ORAL | 0 refills | Status: DC

## 2021-07-30 MED ORDER — HYDROXYZINE PAMOATE 50 MG PO CAPS: 50.0000 mg | ORAL_CAPSULE | Freq: Two times a day (BID) | ORAL | 1 refills | Status: DC | PRN

## 2021-07-30 MED ORDER — PRAZOSIN HCL 1 MG PO CAPS: 3.0000 mg | ORAL_CAPSULE | Freq: Every day | ORAL | Status: DC
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
## 2021-08-26 ENCOUNTER — Ambulatory Visit (INDEPENDENT_AMBULATORY_CARE_PROVIDER_SITE_OTHER): Payer: 59 | Admitting: Internal Medicine

## 2021-08-26 ENCOUNTER — Encounter: Payer: Self-pay | Admitting: Internal Medicine

## 2021-08-26 ENCOUNTER — Other Ambulatory Visit (HOSPITAL_COMMUNITY): Payer: Self-pay

## 2021-08-26 VITALS — BP 122/74 | HR 102 | Resp 18 | Ht 63.5 in | Wt 223.4 lb

## 2021-08-26 DIAGNOSIS — J4541 Moderate persistent asthma with (acute) exacerbation: Secondary | ICD-10-CM

## 2021-08-26 DIAGNOSIS — N898 Other specified noninflammatory disorders of vagina: Secondary | ICD-10-CM | POA: Diagnosis not present

## 2021-08-26 DIAGNOSIS — F3181 Bipolar II disorder: Secondary | ICD-10-CM | POA: Diagnosis not present

## 2021-08-26 MED ORDER — METHYLPREDNISOLONE ACETATE 40 MG/ML IJ SUSP
40.0000 mg | Freq: Once | INTRAMUSCULAR | Status: AC
  Administered 2021-08-26: 40 mg via INTRAMUSCULAR

## 2021-08-26 MED ORDER — PRAZOSIN HCL 1 MG PO CAPS: 3.0000 mg | ORAL_CAPSULE | Freq: Every day | ORAL | Status: DC

## 2021-08-26 MED ORDER — FLUCONAZOLE 150 MG PO TABS: 150.0000 mg | ORAL_TABLET | ORAL | 0 refills | Status: DC

## 2021-08-26 NOTE — Assessment & Plan Note (Signed)
Given her bipolar disorder we need to be cautious with prednisone as this can be triggering of mania or hypomania. She states the steroid IM injection is less triggering of her mental health so we elected to do that today. For any worsening mental health she needs to contact us or her mental health provider.

## 2021-08-26 NOTE — Assessment & Plan Note (Signed)
Feels like this is yeast infection and desires full STD screening. Checking HIV, RPR, GC/chlamydia for screening. Rx diflucan 150 mg q 72 hrs times 2 given that we are giving her a steroid shot this could exacerbate vaginal yeast infection.

## 2021-08-26 NOTE — Patient Instructions (Signed)
We have given you the steroid shot today and will check for STDs. We have sent in yeast infection medicine diflucan to take 1 pill today and then 1 pill on Friday.

## 2021-08-26 NOTE — Progress Notes (Signed)
   Subjective:   [name], female    DOB: 05-28-1985, 36 y.o.   MRN: [id number]  HPI The patient is a 36 YO female coming in for multiple problems.   Review of Systems  Constitutional: Negative.   HENT: Negative.    Eyes: Negative.   Respiratory:  Positive for cough, shortness of breath and wheezing. Negative for chest tightness.   Cardiovascular:  Negative for chest pain, palpitations and leg swelling.  Gastrointestinal:  Negative for abdominal distention, abdominal pain, constipation, diarrhea, nausea and vomiting.  Genitourinary:  Positive for vaginal discharge.  Musculoskeletal: Negative.   Skin: Negative.   Neurological: Negative.   Psychiatric/Behavioral: Negative.      Objective:  Physical Exam Constitutional:      Appearance: She is well-developed.  HENT:     Head: Normocephalic and atraumatic.  Cardiovascular:     Rate and Rhythm: Normal rate and regular rhythm.  Pulmonary:     Effort: Pulmonary effort is normal. No respiratory distress.     Breath sounds: Wheezing present. No rales.  Abdominal:     General: Bowel sounds are normal. There is no distension.     Palpations: Abdomen is soft.     Tenderness: There is no abdominal tenderness. There is no rebound.  Musculoskeletal:     Cervical back: Normal range of motion.  Skin:    General: Skin is warm and dry.  Neurological:     Mental Status: She is alert and oriented to person, place, and time.     Coordination: Coordination normal.     Vitals:   08/26/21 0918  BP: 122/74  Pulse: (!) 102  Resp: 18  SpO2: 93%  Weight: 223 lb 6.4 oz (101.3 kg)  Height: 5' 3.5" (1.613 m)    Assessment & Plan:  Depo-medrol 40 mg IM given at visit

## 2021-08-26 NOTE — Assessment & Plan Note (Signed)
With flare today and albuterol inhaler is not effective to relieve symptoms. She has prominent wheezing both lungs today. Given depo-medrol 40 mg IM today and she declines steroid pills as they worsen her mental health. If no improvement we may need to do this in the future. No clear infection so no antibiotics prescribed today. If no improvement can add.

## 2021-08-27 LAB — HIV ANTIBODY (ROUTINE TESTING W REFLEX): HIV 1&2 Ab, 4th Generation: NONREACTIVE

## 2021-08-27 LAB — RPR: RPR Ser Ql: NONREACTIVE

## 2021-08-28 ENCOUNTER — Other Ambulatory Visit (HOSPITAL_COMMUNITY): Payer: Self-pay

## 2021-08-28 MED ORDER — PRAZOSIN HCL 1 MG PO CAPS: 3.0000 mg | ORAL_CAPSULE | Freq: Every day | ORAL | 1 refills | Status: DC

## 2021-08-29 LAB — GC/CHLAMYDIA PROBE AMP
Chlamydia trachomatis, NAA: NEGATIVE
Neisseria Gonorrhoeae by PCR: NEGATIVE

## 2021-09-11 ENCOUNTER — Other Ambulatory Visit (HOSPITAL_COMMUNITY): Payer: Self-pay

## 2021-09-11 MED ORDER — OXCARBAZEPINE 300 MG PO TABS: 300.0000 mg | ORAL_TABLET | Freq: Two times a day (BID) | ORAL | 0 refills | Status: DC

## 2021-09-15 ENCOUNTER — Other Ambulatory Visit: Payer: Self-pay | Admitting: Allergy & Immunology

## 2021-09-17 ENCOUNTER — Telehealth (HOSPITAL_COMMUNITY): Payer: 59 | Admitting: Psychiatry

## 2021-09-17 ENCOUNTER — Encounter (HOSPITAL_COMMUNITY): Payer: Self-pay

## 2021-09-22 ENCOUNTER — Encounter (HOSPITAL_COMMUNITY): Payer: Self-pay | Admitting: Psychiatry

## 2021-09-22 ENCOUNTER — Telehealth (INDEPENDENT_AMBULATORY_CARE_PROVIDER_SITE_OTHER): Payer: 59 | Admitting: Psychiatry

## 2021-09-22 DIAGNOSIS — F4310 Post-traumatic stress disorder, unspecified: Secondary | ICD-10-CM | POA: Diagnosis not present

## 2021-09-22 DIAGNOSIS — F3181 Bipolar II disorder: Secondary | ICD-10-CM | POA: Diagnosis not present

## 2021-09-22 DIAGNOSIS — F603 Borderline personality disorder: Secondary | ICD-10-CM | POA: Diagnosis not present

## 2021-09-22 DIAGNOSIS — F419 Anxiety disorder, unspecified: Secondary | ICD-10-CM | POA: Diagnosis not present

## 2021-09-22 MED ORDER — GABAPENTIN 600 MG PO TABS
600.0000 mg | ORAL_TABLET | Freq: Three times a day (TID) | ORAL | 1 refills | Status: DC
Start: 2021-09-22 — End: 2021-11-17

## 2021-09-22 MED ORDER — OXCARBAZEPINE 300 MG PO TABS: 300.0000 mg | ORAL_TABLET | Freq: Two times a day (BID) | ORAL | 1 refills | Status: DC

## 2021-09-22 MED ORDER — LATUDA 120 MG PO TABS
120.0000 mg | ORAL_TABLET | Freq: Every day | ORAL | 1 refills | Status: DC
Start: 2021-09-22 — End: 2021-11-12

## 2021-09-22 MED ORDER — HYDROXYZINE PAMOATE 50 MG PO CAPS
50.0000 mg | ORAL_CAPSULE | Freq: Every evening | ORAL | 1 refills | Status: DC | PRN
Start: 2021-09-22 — End: 2021-11-12
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
## 2021-10-09 ENCOUNTER — Telehealth: Payer: Self-pay | Admitting: Internal Medicine

## 2021-10-09 ENCOUNTER — Ambulatory Visit (INDEPENDENT_AMBULATORY_CARE_PROVIDER_SITE_OTHER): Payer: 59 | Admitting: Emergency Medicine

## 2021-10-09 ENCOUNTER — Encounter: Payer: Self-pay | Admitting: Emergency Medicine

## 2021-10-09 VITALS — BP 132/62 | HR 88 | Temp 98.0°F | Ht 63.5 in | Wt 210.2 lb

## 2021-10-09 DIAGNOSIS — J45909 Unspecified asthma, uncomplicated: Secondary | ICD-10-CM | POA: Diagnosis not present

## 2021-10-09 MED ORDER — FLUCONAZOLE 150 MG PO TABS
150.0000 mg | ORAL_TABLET | ORAL | 0 refills | Status: DC
Start: 2021-10-09 — End: 2021-11-12

## 2021-10-09 MED ORDER — PREDNISONE 20 MG PO TABS: 40.0000 mg | ORAL_TABLET | Freq: Every day | ORAL | 0 refills | Status: AC

## 2021-10-09 MED ORDER — METHYLPREDNISOLONE ACETATE 80 MG/ML IJ SUSP
80.0000 mg | Freq: Once | INTRAMUSCULAR | Status: AC
  Administered 2021-10-09: 80 mg via INTRAMUSCULAR

## 2021-10-09 MED ORDER — AMOXICILLIN-POT CLAVULANATE 875-125 MG PO TABS: 1.0000 | ORAL_TABLET | Freq: Two times a day (BID) | ORAL | 0 refills | Status: AC

## 2021-10-09 NOTE — Progress Notes (Signed)
[name] 36 y.o.   Chief Complaint  Patient presents with   Asthma    Pt states she has allergic asthma, difficulty breathing    infected piercing     Pt thinks she has a infection in one of her new piercing .     HISTORY OF PRESENT ILLNESS: This is a 36 y.o. female complaining of productive cough and wheezing with occasional difficulty breathing for several days Also concerned about possible infection at piercing site in the mouth No other complaints or medical concerns today.  HPI   Prior to Admission medications   Medication Sig Start Date End Date Taking? Authorizing Provider  albuterol (PROVENTIL) (2.5 MG/3ML) 0.083% nebulizer solution Take 3 mLs (2.5 mg total) by nebulization every 6 (six) hours as needed for wheezing or shortness of breath. 05/10/20   Valentina Shaggy, MD  albuterol (VENTOLIN HFA) 108 (90 Base) MCG/ACT inhaler Inhale 2 puffs into the lungs every 6 (six) hours as needed for wheezing or shortness of breath. 03/11/21   Valentina Shaggy, MD  bismuth subsalicylate (PEPTO BISMOL) 262 MG/15ML suspension Take 30 mLs by mouth every 6 (six) hours as needed for indigestion or diarrhea or loose stools.    [provider]  budesonide-formoterol (SYMBICORT) 160-4.5 MCG/ACT inhaler Inhale 2 puffs into the lungs in the morning and at bedtime. 03/11/21 04/10/21  Valentina Shaggy, MD  Ferrous Sulfate (IRON PO) Take 1 tablet by mouth daily.    [provider]  fluconazole (DIFLUCAN) 150 MG tablet Take 1 tablet (150 mg total) by mouth every 3 (three) days. 08/26/21   Hoyt Koch, MD  fluticasone Asencion Islam) 50 MCG/ACT nasal spray Place 2 sprays into both nostrils daily. 03/11/21   Valentina Shaggy, MD  gabapentin (NEURONTIN) 600 MG tablet Take 1 tablet (600 mg total) by mouth 3 (three) times daily. 09/22/21   Merian Capron, MD  hydrOXYzine (VISTARIL) 50 MG capsule Take 1 capsule (50 mg total) by mouth at bedtime as needed. 09/22/21   Merian Capron, MD  LATUDA 120 MG TABS Take 1 tablet (120 mg total) by mouth at bedtime. 09/22/21   Merian Capron, MD  levonorgestrel-ethinyl estradiol (NORDETTE) 0.15-30 MG-MCG tablet Take 1 tablet by mouth at bedtime.    [provider]  montelukast (SINGULAIR) 10 MG tablet Take 1 tablet (10 mg total) by mouth at bedtime. 09/15/21   Valentina Shaggy, MD  Multiple Vitamin (MULTIVITAMIN WITH MINERALS) TABS tablet Take 1 tablet by mouth daily. 12/25/20   Armando Reichert, MD  NYSTATIN-TRIAMCINOLONE EX Apply 1 application topically as needed (For yeast infections).    [provider]  omeprazole (PRILOSEC) 40 MG capsule Take 1 capsule (40 mg total) by mouth 2 (two) times daily. 03/11/21   Valentina Shaggy, MD  Oxcarbazepine (TRILEPTAL) 300 MG tablet Take 1 tablet (300 mg total) by mouth 2 (two) times daily. 09/22/21   Merian Capron, MD  prazosin (MINIPRESS) 1 MG capsule Take 3 capsules (3 mg total) by mouth at bedtime. 08/28/21   Merian Capron, MD  Thiamine HCl (THIAMINE PO) Take 1 tablet by mouth daily.    [provider]    Allergies  Allergen Reactions   Benztropine Other (See Comments)    Hypersensitivity     Other Other (See Comments)    Opiates- history of dependency    Lamictal [Lamotrigine] Rash    Patient Active Problem List   Diagnosis Date Noted   Vaginal discharge 08/26/2021   BRBPR (bright  red blood per rectum) 02/27/2021   Anal fissure 02/27/2021   Facial abscess 79/89/2119   Folliculitis 41/74/0814   Bipolar 2 disorder, major depressive episode ([place]) 12/20/2020   Vitamin D deficiency 07/15/2020   B12 deficiency 07/15/2020   MDD (major depressive disorder), recurrent severe, without psychosis ([place]) 04/26/2020   PTSD (post-traumatic stress disorder) 01/03/2020   Allergic asthma 01/13/2018   Allergic rhinitis 03/12/2017   Class 2 obesity due to excess calories without serious comorbidity with body mass index (BMI) of 37.0 to 37.9 in adult  04/17/2016   Asthma 03/13/2016   Borderline personality disorder ([place]) 03/13/2016   Mood disorder ([place]) 03/04/2013    Past Medical History:  Diagnosis Date   Allergy    Anxiety    Asthma    Bipolar 2 disorder ([place])    Borderline personality disorder ([place])    Depression    Eczema    Former smoker    Obesity    PTSD (post-traumatic stress disorder)    Urticaria     Past Surgical History:  Procedure Laterality Date   Thumb surgery Left     Social History   Socioeconomic History   Marital status: Single    Spouse name: Not on file   Number of children: 0   Years of education: 13   Highest education level: Not on file  Occupational History   Not on file  Tobacco Use   Smoking status: Some Days    Packs/day: 0.50    Years: 9.00    Total pack years: 4.50    Types: Cigarettes    Last attempt to quit: 12/04/2015    Years since quitting: 5.8    Passive exposure: Current   Smokeless tobacco: Never  Vaping Use   Vaping Use: Former  Substance and Sexual Activity   Alcohol use: Yes    Alcohol/week: 8.0 standard drinks of alcohol    Types: 8 Cans of beer per week    Comment: occ   Drug use: Yes    Frequency: 4.0 times per week    Types: Marijuana    Comment: Last smoked this morning-03/09/19   Sexual activity: Yes    Birth control/protection: Pill  Other Topics Concern   Not on file  Social History Narrative   Fun: Play video games and watch movies.   Denies abuse and feels safe at home.   Social Determinants of Health   Financial Resource Strain: Not on file  Food Insecurity: Not on file  Transportation Needs: Not on file  Physical Activity: Not on file  Stress: Not on file  Social Connections: Not on file  Intimate Partner Violence: Not on file    Family History  Problem Relation Age of Onset   Healthy Mother    Healthy Father    Alzheimer's disease Maternal Grandmother    Skin cancer Paternal Grandmother    Allergic rhinitis Paternal Grandmother     Asthma Paternal Grandmother    Angioedema Neg Hx    Eczema Neg Hx    Urticaria Neg Hx      Review of Systems  Constitutional: Negative.  Negative for chills and fever.  HENT: Negative.  Negative for congestion and sore throat.   Respiratory:  Positive for cough, sputum production, shortness of breath and wheezing. Negative for hemoptysis.   Cardiovascular: Negative.  Negative for chest pain and palpitations.  Gastrointestinal:  Negative for abdominal pain, nausea and vomiting.  Genitourinary: Negative.   Skin: Negative.  Negative for rash.  Neurological: Negative.  Negative for dizziness and headaches.  All other systems reviewed and are negative.  Today's Vitals   10/09/21 1531  BP: 132/62  Pulse: 88  Temp: 98 F (36.7 C)  TempSrc: Oral  Weight: 210 lb 4 oz (95.4 kg)  Height: 5' 3.5" (1.613 m)   Body mass index is 36.66 kg/m.   Physical Exam Vitals reviewed.  Constitutional:      Appearance: Normal appearance.  HENT:     Head: Normocephalic.     Mouth/Throat:     Mouth: Mucous membranes are moist.     Pharynx: Oropharynx is clear.  Eyes:     Extraocular Movements: Extraocular movements intact.     Pupils: Pupils are equal, round, and reactive to light.  Cardiovascular:     Rate and Rhythm: Normal rate and regular rhythm.     Pulses: Normal pulses.     Heart sounds: Normal heart sounds.  Pulmonary:     Effort: Pulmonary effort is normal. No respiratory distress.     Breath sounds: Wheezing present. No rales.  Musculoskeletal:     Cervical back: No tenderness.  Lymphadenopathy:     Cervical: No cervical adenopathy.  Skin:    General: Skin is warm and dry.     Comments: Several piercings in the face including both cheeks and nose No evidence of infection.  Neurological:     General: No focal deficit present.     Mental Status: She is alert and oriented to person, place, and time.  Psychiatric:        Mood and Affect: Mood normal.        Behavior: Behavior  normal.      ASSESSMENT & PLAN: A total of 42 minutes was spent with the patient and counseling/coordination of care regarding preparing for this visit, review of most recent office visit notes, review of multiple chronic medical problems and their management, review of all medications, diagnosis of acute asthmatic bronchitis and need for treatment with antibiotics, prognosis, documentation and need for follow-up if no better or worse during the next several days.  Problem List Items Addressed This Visit       Respiratory   Acute asthmatic bronchitis - Primary    With history of asthma and presently wheezing  Clinically stable in no respiratory distress.  Oxygenating well. Depo-Medrol 80 mg IM given We will start prednisone 40 mg daily for 5 days Continue using albuterol inhaler Continue Symbicort 2 puffs twice a day Start Augmentin 875 mg twice a day for 7 days ED precautions given Advised to contact the office if no better or worse during the next several days.      Relevant Medications   amoxicillin-clavulanate (AUGMENTIN) 875-125 MG tablet   predniSONE (DELTASONE) 20 MG tablet   Patient Instructions  Acute Bronchitis, Adult  Acute bronchitis is when air tubes in the lungs (bronchi) suddenly get swollen. The condition can make it hard for you to breathe. In adults, acute bronchitis usually goes away within 2 weeks. A cough caused by bronchitis may last up to 3 weeks. Smoking, allergies, and asthma can make the condition worse. What are the causes? Germs that cause cold and flu (viruses). The most common cause of this condition is the virus that causes the common cold. Bacteria. Substances that bother (irritate) the lungs, including: Smoke from cigarettes and other types of tobacco. Dust and pollen. Fumes from chemicals, gases, or burned fuel. Indoor or outdoor air pollution. What increases the risk? A  weak body's defense system. This is also called the immune system. Any  condition that affects your lungs and breathing, such as asthma. What are the signs or symptoms? A cough. Coughing up clear, yellow, or green mucus. Making high-pitched whistling sounds when you breathe, most often when you breathe out (wheezing). Runny or stuffy nose. Having too much mucus in your lungs (chest congestion). Shortness of breath. Body aches. A sore throat. How is this treated? Acute bronchitis may go away over time without treatment. Your doctor may tell you to: Drink more fluids. This will help thin your mucus so it is easier to cough up. Use a device that gets medicine into your lungs (inhaler). Use a vaporizer or a humidifier. These are machines that add water to the air. This helps with coughing and poor breathing. Take a medicine that thins mucus and helps clear it from your lungs. Take a medicine that prevents or stops coughing. It is not common to take an antibiotic medicine for this condition. Follow these instructions at home:  Take over-the-counter and prescription medicines only as told by your doctor. Use an inhaler, vaporizer, or humidifier as told by your doctor. Take two teaspoons (10 mL) of honey at bedtime. This helps lessen your coughing at night. Drink enough fluid to keep your pee (urine) pale yellow. Do not smoke or use any products that contain nicotine or tobacco. If you need help quitting, ask your doctor. Get a lot of rest. Return to your normal activities when your doctor says that it is safe. Keep all follow-up visits. How is this prevented?  Wash your hands often with soap and water for at least 20 seconds. If you cannot use soap and water, use hand sanitizer. Avoid contact with people who have cold symptoms. Try not to touch your mouth, nose, or eyes with your hands. Avoid breathing in smoke or chemical fumes. Make sure to get the flu shot every year. Contact a doctor if: Your symptoms do not get better in 2 weeks. You have trouble  coughing up the mucus. Your cough keeps you awake at night. You have a fever. Get help right away if: You cough up blood. You have chest pain. You have very bad shortness of breath. You faint or keep feeling like you are going to faint. You have a very bad headache. Your fever or chills get worse. These symptoms may be an emergency. Get help right away. Call your local emergency services (911 in the [place] Do not wait to see if the symptoms will go away. Do not drive yourself to the hospital. Summary Acute bronchitis is when air tubes in the lungs (bronchi) suddenly get swollen. In adults, acute bronchitis usually goes away within 2 weeks. Drink more fluids. This will help thin your mucus so it is easier to cough up. Take over-the-counter and prescription medicines only as told by your doctor. Contact a doctor if your symptoms do not improve after 2 weeks of treatment. This information is not intended to replace advice given to you by your health care provider. Make sure you discuss any questions you have with your health care provider. Document Revised: 05/22/2020 Document Reviewed: 05/22/2020 Elsevier Patient Education  [place], MD [facility]

## 2021-10-09 NOTE — Patient Instructions (Signed)
  Acute Bronchitis, Adult  Acute bronchitis is when air tubes in the lungs (bronchi) suddenly get swollen. The condition can make it hard for you to breathe. In adults, acute bronchitis usually goes away within 2 weeks. A cough caused by bronchitis may last up to 3 weeks. Smoking, allergies, and asthma can make the condition worse. What are the causes? Germs that cause cold and flu (viruses). The most common cause of this condition is the virus that causes the common cold. Bacteria. Substances that bother (irritate) the lungs, including: Smoke from cigarettes and other types of tobacco. Dust and pollen. Fumes from chemicals, gases, or burned fuel. Indoor or outdoor air pollution. What increases the risk? A weak body's defense system. This is also called the immune system. Any condition that affects your lungs and breathing, such as asthma. What are the signs or symptoms? A cough. Coughing up clear, yellow, or green mucus. Making high-pitched whistling sounds when you breathe, most often when you breathe out (wheezing). Runny or stuffy nose. Having too much mucus in your lungs (chest congestion). Shortness of breath. Body aches. A sore throat. How is this treated? Acute bronchitis may go away over time without treatment. Your doctor may tell you to: Drink more fluids. This will help thin your mucus so it is easier to cough up. Use a device that gets medicine into your lungs (inhaler). Use a vaporizer or a humidifier. These are machines that add water to the air. This helps with coughing and poor breathing. Take a medicine that thins mucus and helps clear it from your lungs. Take a medicine that prevents or stops coughing. It is not common to take an antibiotic medicine for this condition. Follow these instructions at home:  Take over-the-counter and prescription medicines only as told by your doctor. Use an inhaler, vaporizer, or humidifier as told by your doctor. Take two  teaspoons (10 mL) of honey at bedtime. This helps lessen your coughing at night. Drink enough fluid to keep your pee (urine) pale yellow. Do not smoke or use any products that contain nicotine or tobacco. If you need help quitting, ask your doctor. Get a lot of rest. Return to your normal activities when your doctor says that it is safe. Keep all follow-up visits. How is this prevented?  Wash your hands often with soap and water for at least 20 seconds. If you cannot use soap and water, use hand sanitizer. Avoid contact with people who have cold symptoms. Try not to touch your mouth, nose, or eyes with your hands. Avoid breathing in smoke or chemical fumes. Make sure to get the flu shot every year. Contact a doctor if: Your symptoms do not get better in 2 weeks. You have trouble coughing up the mucus. Your cough keeps you awake at night. You have a fever. Get help right away if: You cough up blood. You have chest pain. You have very bad shortness of breath. You faint or keep feeling like you are going to faint. You have a very bad headache. Your fever or chills get worse. These symptoms may be an emergency. Get help right away. Call your local emergency services (911 in the [place] Do not wait to see if the symptoms will go away. Do not drive yourself to the hospital. Summary Acute bronchitis is when air tubes in the lungs (bronchi) suddenly get swollen. In adults, acute bronchitis usually goes away within 2 weeks. Drink more fluids. This will help thin your mucus so it   is easier to cough up. Take over-the-counter and prescription medicines only as told by your doctor. Contact a doctor if your symptoms do not improve after 2 weeks of treatment. This information is not intended to replace advice given to you by your health care provider. Make sure you discuss any questions you have with your health care provider. Document Revised: 05/22/2020 Document Reviewed: 05/22/2020 Elsevier  Patient Education  2023 Elsevier Inc.  

## 2021-10-10 NOTE — Assessment & Plan Note (Signed)
With history of asthma and presently wheezing  Clinically stable in no respiratory distress.  Oxygenating well. Depo-Medrol 80 mg IM given We will start prednisone 40 mg daily for 5 days Continue using albuterol inhaler Continue Symbicort 2 puffs twice a day Start Augmentin 875 mg twice a day for 7 days ED precautions given Advised to contact the office if no better or worse during the next several days.

## 2021-11-12 ENCOUNTER — Other Ambulatory Visit (HOSPITAL_COMMUNITY): Payer: Self-pay

## 2021-11-12 ENCOUNTER — Ambulatory Visit (INDEPENDENT_AMBULATORY_CARE_PROVIDER_SITE_OTHER): Payer: 59 | Admitting: Internal Medicine

## 2021-11-12 ENCOUNTER — Other Ambulatory Visit: Payer: Self-pay | Admitting: Internal Medicine

## 2021-11-12 ENCOUNTER — Ambulatory Visit (INDEPENDENT_AMBULATORY_CARE_PROVIDER_SITE_OTHER): Payer: 59

## 2021-11-12 ENCOUNTER — Encounter: Payer: Self-pay | Admitting: Internal Medicine

## 2021-11-12 ENCOUNTER — Telehealth: Payer: Self-pay | Admitting: Internal Medicine

## 2021-11-12 VITALS — BP 122/68 | HR 81 | Temp 98.8°F | Ht 63.0 in | Wt 212.0 lb

## 2021-11-12 DIAGNOSIS — J45909 Unspecified asthma, uncomplicated: Secondary | ICD-10-CM | POA: Diagnosis not present

## 2021-11-12 DIAGNOSIS — Z0001 Encounter for general adult medical examination with abnormal findings: Secondary | ICD-10-CM | POA: Diagnosis not present

## 2021-11-12 DIAGNOSIS — R051 Acute cough: Secondary | ICD-10-CM | POA: Diagnosis not present

## 2021-11-12 DIAGNOSIS — K921 Melena: Secondary | ICD-10-CM | POA: Diagnosis not present

## 2021-11-12 DIAGNOSIS — K625 Hemorrhage of anus and rectum: Secondary | ICD-10-CM

## 2021-11-12 DIAGNOSIS — Z23 Encounter for immunization: Secondary | ICD-10-CM

## 2021-11-12 DIAGNOSIS — R739 Hyperglycemia, unspecified: Secondary | ICD-10-CM

## 2021-11-12 DIAGNOSIS — L0390 Cellulitis, unspecified: Secondary | ICD-10-CM | POA: Insufficient documentation

## 2021-11-12 DIAGNOSIS — E538 Deficiency of other specified B group vitamins: Secondary | ICD-10-CM | POA: Diagnosis not present

## 2021-11-12 DIAGNOSIS — E559 Vitamin D deficiency, unspecified: Secondary | ICD-10-CM

## 2021-11-12 LAB — URINALYSIS, ROUTINE W REFLEX MICROSCOPIC
Bilirubin Urine: NEGATIVE
Hgb urine dipstick: NEGATIVE
Ketones, ur: NEGATIVE
Leukocytes,Ua: NEGATIVE
Nitrite: NEGATIVE
RBC / HPF: NONE SEEN (ref 0–?)
Specific Gravity, Urine: 1.015 (ref 1.000–1.030)
Total Protein, Urine: NEGATIVE
Urine Glucose: NEGATIVE
Urobilinogen, UA: 0.2 (ref 0.0–1.0)
pH: 8 (ref 5.0–8.0)

## 2021-11-12 LAB — TSH: TSH: 2.41 u[IU]/mL (ref 0.35–5.50)

## 2021-11-12 LAB — CBC WITH DIFFERENTIAL/PLATELET
Basophils Absolute: 0.1 10*3/uL (ref 0.0–0.1)
Basophils Relative: 0.8 % (ref 0.0–3.0)
Eosinophils Absolute: 0.2 10*3/uL (ref 0.0–0.7)
Eosinophils Relative: 2.4 % (ref 0.0–5.0)
HCT: 43.8 % (ref 36.0–46.0)
Hemoglobin: 14.7 g/dL (ref 12.0–15.0)
Lymphocytes Relative: 23.7 % (ref 12.0–46.0)
Lymphs Abs: 2 10*3/uL (ref 0.7–4.0)
MCHC: 33.6 g/dL (ref 30.0–36.0)
MCV: 90.7 fl (ref 78.0–100.0)
Monocytes Absolute: 0.5 10*3/uL (ref 0.1–1.0)
Monocytes Relative: 5.7 % (ref 3.0–12.0)
Neutro Abs: 5.6 10*3/uL (ref 1.4–7.7)
Neutrophils Relative %: 67.4 % (ref 43.0–77.0)
Platelets: 277 10*3/uL (ref 150.0–400.0)
RBC: 4.82 Mil/uL (ref 3.87–5.11)
RDW: 13.3 % (ref 11.5–15.5)
WBC: 8.2 10*3/uL (ref 4.0–10.5)

## 2021-11-12 LAB — LIPID PANEL
Cholesterol: 177 mg/dL (ref 0–200)
HDL: 60.9 mg/dL (ref >39.00)
LDL Cholesterol: 99 mg/dL (ref 0–99)
NonHDL: 116.54
Total CHOL/HDL Ratio: 3
Triglycerides: 90 mg/dL (ref 0.0–149.0)
VLDL: 18 mg/dL (ref 0.0–40.0)

## 2021-11-12 LAB — BASIC METABOLIC PANEL
BUN: 9 mg/dL (ref 6–23)
CO2: 25 mEq/L (ref 19–32)
Calcium: 9.1 mg/dL (ref 8.4–10.5)
Chloride: 103 mEq/L (ref 96–112)
Creatinine, Ser: 0.83 mg/dL (ref 0.40–1.20)
GFR: 92.25 mL/min (ref >60.00)
Glucose, Bld: 103 mg/dL — ABNORMAL HIGH (ref 70–99)
Potassium: 4 mEq/L (ref 3.5–5.1)
Sodium: 138 mEq/L (ref 135–145)

## 2021-11-12 LAB — VITAMIN B12: Vitamin B-12: 216 pg/mL (ref 211–911)

## 2021-11-12 LAB — VITAMIN D 25 HYDROXY (VIT D DEFICIENCY, FRACTURES): VITD: 38.2 ng/mL (ref 30.00–100.00)

## 2021-11-12 LAB — HEPATIC FUNCTION PANEL
ALT: 17 U/L (ref 0–35)
AST: 19 U/L (ref 0–37)
Albumin: 4 g/dL (ref 3.5–5.2)
Alkaline Phosphatase: 74 U/L (ref 39–117)
Bilirubin, Direct: 0.2 mg/dL (ref 0.0–0.3)
Total Bilirubin: 0.6 mg/dL (ref 0.2–1.2)
Total Protein: 7 g/dL (ref 6.0–8.3)

## 2021-11-12 LAB — HEMOGLOBIN A1C: Hgb A1c MFr Bld: 5.6 % (ref 4.6–6.5)

## 2021-11-12 MED ORDER — PREDNISONE 10 MG PO TABS: ORAL_TABLET | ORAL | 0 refills | Status: DC

## 2021-11-12 MED ORDER — FLUCONAZOLE 150 MG PO TABS: 150.0000 mg | ORAL_TABLET | ORAL | 0 refills | Status: DC

## 2021-11-12 MED ORDER — PRAZOSIN HCL 1 MG PO CAPS: 3.0000 mg | ORAL_CAPSULE | Freq: Every day | ORAL | 1 refills | Status: DC

## 2021-11-12 MED ORDER — LATUDA 120 MG PO TABS: 120.0000 mg | ORAL_TABLET | Freq: Every day | ORAL | 1 refills | Status: DC

## 2021-11-12 MED ORDER — METHYLPREDNISOLONE ACETATE 80 MG/ML IJ SUSP
80.0000 mg | Freq: Once | INTRAMUSCULAR | Status: AC
  Administered 2021-11-12: 80 mg via INTRAMUSCULAR

## 2021-11-12 MED ORDER — HYDROXYZINE PAMOATE 50 MG PO CAPS: 50.0000 mg | ORAL_CAPSULE | Freq: Every evening | ORAL | 1 refills | Status: DC | PRN

## 2021-11-12 MED ORDER — DOXYCYCLINE HYCLATE 100 MG PO TABS: 100.0000 mg | ORAL_TABLET | Freq: Two times a day (BID) | ORAL | 0 refills | Status: DC

## 2021-11-12 NOTE — Assessment & Plan Note (Addendum)
Mild to mod, for doxy course, cough med prn, prednisone asd,  depomedrol iim 80 mg, to f/u any worsening symptoms or concerns, also cxr - r/o pna

## 2021-11-12 NOTE — Telephone Encounter (Signed)
Patient was seen today and the RSV shot was recommended.  Patient insurance company will not approve this.  She needs you to write a letter to her insurance company telling them why this shot is needed for her.   Patient has [place] [employer] - Patient would like this letter sent directly to her insurance company.  Patient would like for you to let her know when the letter is sent - so she can know to check back with [place].

## 2021-11-12 NOTE — Patient Instructions (Addendum)
You had the flu shot today; the RSV shot can be done at the pharmacy  You had the steroid shot today  Please take all new medication as prescribed - the antibiotic  Please continue all other medications as before, and refills have been done if requested.  Please have the pharmacy call with any other refills you may need.  Please continue your efforts at being more active, low cholesterol diet, and weight control.  You are otherwise up to date with prevention measures today.  Please keep your appointments with your specialists as you may have planned  You will be contacted regarding the referral for: Gastroenterology  Please go to the XRAY Department in the first floor for the x-ray testing  Please go to the LAB at the blood drawing area for the tests to be done  You will be contacted by phone if any changes need to be made immediately.  Otherwise, you will receive a letter about your results with an explanation, but please check with MyChart first.  Please remember to sign up for MyChart if you have not done so, as this will be important to you in the future with finding out test results, communicating by private email, and scheduling acute appointments online when needed.  Please make an Appointment to return for your 1 year visit, or sooner if needed

## 2021-11-12 NOTE — Progress Notes (Signed)
[name], female   DOB: [birth year], 36 y.o.   MRN: [id number]         Chief Complaint:: wellness exam and Wound Infection (May have infected percings , cuts and respiratory infection)  , cough, wheeze       HPI:  [name] is a 36 y.o. female here for wellness exam; for flu shot, declines covid booster, for RSV at the pharmacy, o/w up to date                        Also Here with acute onset mild to mod 2-3 days ST, HA, general weakness and malaise, with prod cough greenish sputum, but Pt denies chest pain, increased sob or doe, wheezing, orthopnea, PND, increased LE swelling, palpitations, dizziness or syncope, except for mild wheezing onset last pm.  Denies worsening reflux, abd pain, dysphagia, n/v, bowel change, but does have recurrent small volume BRBPR, has been putting off G evalatuion, but now wants referral.  Also mild erythema, tender swelling to one of the areas of her cutting her upper legs.  Not pregnant, currently on menses   Wt Readings from Last 3 Encounters:  11/12/21 212 lb (96.2 kg)  10/09/21 210 lb 4 oz (95.4 kg)  08/26/21 223 lb 6.4 oz (101.3 kg)   BP Readings from Last 3 Encounters:  11/12/21 122/68  10/09/21 132/62  08/26/21 122/74   Immunization History  Administered Date(s) Administered   Influenza,inj,Quad PF,6+ Mos 10/15/2016, 10/26/2017, 10/23/2020, 11/12/2021   Influenza-Unspecified 10/04/2015   PFIZER(Purple Top)SARS-COV-2 Vaccination 04/27/2019, 05/22/2019   Pneumococcal Polysaccharide-23 10/15/2016, 07/10/2020   Tdap 04/17/2016   There are no preventive care reminders to display for this patient.     Past Medical History:  Diagnosis Date   Allergy    Anxiety    Asthma    Bipolar 2 disorder ([place])    Borderline personality disorder ([place])    Depression    Eczema    Former smoker    Obesity    PTSD (post-traumatic stress disorder)    Urticaria    Past Surgical History:  Procedure Laterality Date   Thumb surgery Left      reports that she has been smoking cigarettes. She has a 4.50 pack-year smoking history. She has been exposed to tobacco smoke. She has never used smokeless tobacco. She reports current alcohol use of about 8.0 standard drinks of alcohol per week. She reports current drug use. Frequency: 4.00 times per week. Drug: Marijuana. family history includes Allergic rhinitis in her paternal grandmother; Alzheimer's disease in her maternal grandmother; Asthma in her paternal grandmother; Healthy in her father and mother; Skin cancer in her paternal grandmother. Allergies  Allergen Reactions   Benztropine Other (See Comments)    Hypersensitivity     Other Other (See Comments)    Opiates- history of dependency    Lamictal [Lamotrigine] Rash   Current Outpatient Medications on File Prior to Visit  Medication Sig Dispense Refill   albuterol (PROVENTIL) (2.5 MG/3ML) 0.083% nebulizer solution Take 3 mLs (2.5 mg total) by nebulization every 6 (six) hours as needed for wheezing or shortness of breath. 150 mL 1   albuterol (VENTOLIN HFA) 108 (90 Base) MCG/ACT inhaler Inhale 2 puffs into the lungs every 6 (six) hours as needed for wheezing or shortness of breath. 8 g 2   Ferrous Sulfate (IRON PO) Take 1 tablet by mouth daily.     fluticasone (FLONASE) 50 MCG/ACT nasal spray  Place 2 sprays into both nostrils daily. 1 g 5   gabapentin (NEURONTIN) 600 MG tablet Take 1 tablet (600 mg total) by mouth 3 (three) times daily. 90 tablet 1   levonorgestrel-ethinyl estradiol (NORDETTE) 0.15-30 MG-MCG tablet Take 1 tablet by mouth at bedtime.     montelukast (SINGULAIR) 10 MG tablet Take 1 tablet (10 mg total) by mouth at bedtime. 30 tablet 1   Multiple Vitamin (MULTIVITAMIN WITH MINERALS) TABS tablet Take 1 tablet by mouth daily. 30 tablet 0   NYSTATIN-TRIAMCINOLONE EX Apply 1 application topically as needed (For yeast infections).     omeprazole (PRILOSEC) 40 MG capsule Take 1 capsule (40 mg total) by mouth 2 (two)  times daily. 60 capsule 5   Oxcarbazepine (TRILEPTAL) 300 MG tablet Take 1 tablet (300 mg total) by mouth 2 (two) times daily. 60 tablet 1   Thiamine HCl (THIAMINE PO) Take 1 tablet by mouth daily.     budesonide-formoterol (SYMBICORT) 160-4.5 MCG/ACT inhaler Inhale 2 puffs into the lungs in the morning and at bedtime. 1 each 5   No current facility-administered medications on file prior to visit.        ROS:  All others reviewed and negative.  Objective        PE:  BP 122/68 (BP Location: Right Arm, Patient Position: Sitting, Cuff Size: Normal)   Pulse 81   Temp 98.8 F (37.1 C) (Oral)   Ht '5\' 3"'$  (1.6 m)   Wt 212 lb (96.2 kg)   SpO2 99%   BMI 37.55 kg/m                 Constitutional: Pt appears in NAD               HENT: Head: NCAT.                Right Ear: External ear normal.                 Left Ear: External ear normal.                Eyes: . Pupils are equal, round, and reactive to light. Conjunctivae and EOM are normal               Nose: without d/c or deformity               Neck: Neck supple. Gross normal ROM               Cardiovascular: Normal rate and regular rhythm.                 Pulmonary/Chest: Effort normal and breath sounds without rales or wheezing.                Abd:  Soft, NT, ND, + BS, no organomegaly               Neurological: Pt is alert. At baseline orientation, motor grossly intact               Skin: LE edema - none, left proximal thigh with 3 cm area red, tender, swelling               Psychiatric: Pt behavior is normal without agitation   Micro: none  Cardiac tracings I have personally interpreted today:  none  Pertinent Radiological findings (summarize): none   Lab Results  Component Value Date   WBC 8.2 11/12/2021   HGB 14.7 11/12/2021   HCT 43.8  11/12/2021   PLT 277.0 11/12/2021   GLUCOSE 103 (H) 11/12/2021   CHOL 177 11/12/2021   TRIG 90.0 11/12/2021   HDL 60.90 11/12/2021   LDLCALC 99 11/12/2021   ALT 17 11/12/2021   AST 19  11/12/2021   NA 138 11/12/2021   K 4.0 11/12/2021   CL 103 11/12/2021   CREATININE 0.83 11/12/2021   BUN 9 11/12/2021   CO2 25 11/12/2021   TSH 2.41 11/12/2021   HGBA1C 5.6 11/12/2021   Assessment/Plan:  [name] is a 36 y.o. White or Caucasian [1] female with  has a past medical history of Allergy, Anxiety, Asthma, Bipolar 2 disorder ([place]), Borderline personality disorder ([place]), Depression, Eczema, Former smoker, Obesity, PTSD (post-traumatic stress disorder), and Urticaria.  Encounter for well adult exam with abnormal findings Age and sex appropriate education and counseling updated with regular exercise and diet Referrals for preventative services - none needed Immunizations addressed - for RSV at pharmacy if ok with insurance, declines covid booster, for flu shot today Smoking counseling  - none needed Evidence for depression or other mood disorder - chronic anxiety depression stable, no SI or HI Most recent labs reviewed. I have personally reviewed and have noted: 1) the patient's medical and social history 2) The patient's current medications and supplements 3) The patient's height, weight, and BMI have been recorded in the chart   Cellulitis Left prox thigh at site of self injury with cutting - for doxycycline 100 bid  X 10 days  B12 deficiency Lab Results  Component Value Date   VITAMINB12 216 11/12/2021   Slow, reminded to start oral replacement - b12 1000 mcg qd   BRBPR (bright red blood per rectum) Also for GI referral, f/u cbc today  Acute asthmatic bronchitis Mild to mod, for doxy course, cough med prn, prednisone asd,  depomedrol iim 80 mg, to f/u any worsening symptoms or concerns, also cxr - r/o pna  Followup: Return in about 1 year (around 11/13/2022).  Cathlean Cower, MD 11/12/2021 8:37 PM [facility]

## 2021-11-12 NOTE — Telephone Encounter (Signed)
Very sorry, I would not write a letter if the insurance will not approve, thanks

## 2021-11-12 NOTE — Telephone Encounter (Signed)
RSV not covered by insurance, patient wants a letter sent to them, I don't think that is possible please advise

## 2021-11-12 NOTE — Assessment & Plan Note (Signed)
Lab Results  Component Value Date   VITAMINB12 216 11/12/2021   Slow, reminded to start oral replacement - b12 1000 mcg qd

## 2021-11-12 NOTE — Telephone Encounter (Signed)
Patient informed we will not send a letter to her insurance company and she would like an explanation.

## 2021-11-12 NOTE — Assessment & Plan Note (Signed)
Age and sex appropriate education and counseling updated with regular exercise and diet Referrals for preventative services - none needed Immunizations addressed - for RSV at pharmacy if ok with insurance, declines covid booster, for flu shot today Smoking counseling  - none needed Evidence for depression or other mood disorder - chronic anxiety depression stable, no SI or HI Most recent labs reviewed. I have personally reviewed and have noted: 1) the patient's medical and social history 2) The patient's current medications and supplements 3) The patient's height, weight, and BMI have been recorded in the chart

## 2021-11-12 NOTE — Assessment & Plan Note (Signed)
Left prox thigh at site of self injury with cutting - for doxycycline 100 bid  X 10 days

## 2021-11-12 NOTE — Assessment & Plan Note (Signed)
Also for GI referral, f/u cbc today

## 2021-11-13 ENCOUNTER — Encounter (HOSPITAL_COMMUNITY): Payer: Self-pay | Admitting: Emergency Medicine

## 2021-11-13 ENCOUNTER — Emergency Department (EMERGENCY_DEPARTMENT_HOSPITAL)
Admission: EM | Admit: 2021-11-13 | Discharge: 2021-11-13 | Disposition: A | Discharge status: Other Acute Inpatient Hospital | Payer: 59 | Source: Home / Self Care | Attending: Emergency Medicine | Admitting: Emergency Medicine

## 2021-11-13 ENCOUNTER — Encounter (HOSPITAL_COMMUNITY): Payer: Self-pay | Admitting: Nurse Practitioner

## 2021-11-13 ENCOUNTER — Other Ambulatory Visit: Payer: Self-pay

## 2021-11-13 ENCOUNTER — Inpatient Hospital Stay (HOSPITAL_COMMUNITY)
Admission: AD | Admit: 2021-11-13 | Discharge: 2021-11-17 | DRG: 885 | Disposition: A | Discharge status: Home / Self Care | Payer: 59 | Source: Intra-hospital | Attending: Psychiatry | Admitting: Psychiatry

## 2021-11-13 DIAGNOSIS — F603 Borderline personality disorder: Secondary | ICD-10-CM | POA: Diagnosis present

## 2021-11-13 DIAGNOSIS — T426X2A Poisoning by other antiepileptic and sedative-hypnotic drugs, intentional self-harm, initial encounter: Secondary | ICD-10-CM | POA: Diagnosis present

## 2021-11-13 DIAGNOSIS — J45909 Unspecified asthma, uncomplicated: Secondary | ICD-10-CM | POA: Diagnosis present

## 2021-11-13 DIAGNOSIS — F429 Obsessive-compulsive disorder, unspecified: Secondary | ICD-10-CM | POA: Diagnosis present

## 2021-11-13 DIAGNOSIS — E611 Iron deficiency: Secondary | ICD-10-CM | POA: Diagnosis present

## 2021-11-13 DIAGNOSIS — F4310 Post-traumatic stress disorder, unspecified: Secondary | ICD-10-CM | POA: Insufficient documentation

## 2021-11-13 DIAGNOSIS — F1290 Cannabis use, unspecified, uncomplicated: Secondary | ICD-10-CM | POA: Diagnosis present

## 2021-11-13 DIAGNOSIS — Z20822 Contact with and (suspected) exposure to covid-19: Secondary | ICD-10-CM | POA: Diagnosis present

## 2021-11-13 DIAGNOSIS — F1190 Opioid use, unspecified, uncomplicated: Secondary | ICD-10-CM | POA: Diagnosis present

## 2021-11-13 DIAGNOSIS — F909 Attention-deficit hyperactivity disorder, unspecified type: Secondary | ICD-10-CM | POA: Diagnosis present

## 2021-11-13 DIAGNOSIS — L0390 Cellulitis, unspecified: Secondary | ICD-10-CM | POA: Diagnosis present

## 2021-11-13 DIAGNOSIS — F410 Panic disorder [episodic paroxysmal anxiety] without agoraphobia: Secondary | ICD-10-CM | POA: Diagnosis present

## 2021-11-13 DIAGNOSIS — F3181 Bipolar II disorder: Principal | ICD-10-CM | POA: Diagnosis present

## 2021-11-13 DIAGNOSIS — Z560 Unemployment, unspecified: Secondary | ICD-10-CM

## 2021-11-13 DIAGNOSIS — Z793 Long term (current) use of hormonal contraceptives: Secondary | ICD-10-CM

## 2021-11-13 DIAGNOSIS — Z1152 Encounter for screening for COVID-19: Secondary | ICD-10-CM | POA: Insufficient documentation

## 2021-11-13 DIAGNOSIS — G4700 Insomnia, unspecified: Secondary | ICD-10-CM | POA: Diagnosis present

## 2021-11-13 DIAGNOSIS — F17210 Nicotine dependence, cigarettes, uncomplicated: Secondary | ICD-10-CM | POA: Diagnosis present

## 2021-11-13 DIAGNOSIS — F319 Bipolar disorder, unspecified: Principal | ICD-10-CM | POA: Diagnosis present

## 2021-11-13 DIAGNOSIS — R4589 Other symptoms and signs involving emotional state: Secondary | ICD-10-CM | POA: Diagnosis present

## 2021-11-13 DIAGNOSIS — F1690 Hallucinogen use, unspecified, uncomplicated: Secondary | ICD-10-CM | POA: Diagnosis present

## 2021-11-13 DIAGNOSIS — Z79899 Other long term (current) drug therapy: Secondary | ICD-10-CM | POA: Diagnosis not present

## 2021-11-13 DIAGNOSIS — F22 Delusional disorders: Secondary | ICD-10-CM | POA: Diagnosis present

## 2021-11-13 DIAGNOSIS — Z825 Family history of asthma and other chronic lower respiratory diseases: Secondary | ICD-10-CM

## 2021-11-13 DIAGNOSIS — R45851 Suicidal ideations: Secondary | ICD-10-CM | POA: Insufficient documentation

## 2021-11-13 DIAGNOSIS — Z7951 Long term (current) use of inhaled steroids: Secondary | ICD-10-CM | POA: Diagnosis not present

## 2021-11-13 DIAGNOSIS — F1490 Cocaine use, unspecified, uncomplicated: Secondary | ICD-10-CM | POA: Diagnosis present

## 2021-11-13 DIAGNOSIS — T1491XA Suicide attempt, initial encounter: Secondary | ICD-10-CM

## 2021-11-13 DIAGNOSIS — T50902A Poisoning by unspecified drugs, medicaments and biological substances, intentional self-harm, initial encounter: Secondary | ICD-10-CM | POA: Insufficient documentation

## 2021-11-13 DIAGNOSIS — F3130 Bipolar disorder, current episode depressed, mild or moderate severity, unspecified: Secondary | ICD-10-CM | POA: Diagnosis not present

## 2021-11-13 LAB — COMPREHENSIVE METABOLIC PANEL
ALT: 19 U/L (ref 0–44)
AST: 20 U/L (ref 15–41)
Albumin: 4 g/dL (ref 3.5–5.0)
Alkaline Phosphatase: 67 U/L (ref 38–126)
Anion gap: 9 (ref 5–15)
BUN: 6 mg/dL (ref 6–20)
CO2: 20 mmol/L — ABNORMAL LOW (ref 22–32)
Calcium: 8.7 mg/dL — ABNORMAL LOW (ref 8.9–10.3)
Chloride: 111 mmol/L (ref 98–111)
Creatinine, Ser: 0.66 mg/dL (ref 0.44–1.00)
GFR, Estimated: >60 mL/min (ref >60)
Glucose, Bld: 109 mg/dL — ABNORMAL HIGH (ref 70–99)
Potassium: 4 mmol/L (ref 3.5–5.1)
Sodium: 140 mmol/L (ref 135–145)
Total Bilirubin: 0.5 mg/dL (ref 0.3–1.2)
Total Protein: 7.5 g/dL (ref 6.5–8.1)

## 2021-11-13 LAB — RAPID URINE DRUG SCREEN, HOSP PERFORMED
Amphetamines: NOT DETECTED
Barbiturates: NOT DETECTED
Benzodiazepines: NOT DETECTED
Cocaine: POSITIVE — AB
Opiates: NOT DETECTED
Tetrahydrocannabinol: POSITIVE — AB

## 2021-11-13 LAB — SALICYLATE LEVEL: Salicylate Lvl: <7 mg/dL — ABNORMAL LOW (ref 7.0–30.0)

## 2021-11-13 LAB — CBC
HCT: 47.3 % — ABNORMAL HIGH (ref 36.0–46.0)
Hemoglobin: 15.9 g/dL — ABNORMAL HIGH (ref 12.0–15.0)
MCH: 30.5 pg (ref 26.0–34.0)
MCHC: 33.6 g/dL (ref 30.0–36.0)
MCV: 90.8 fL (ref 80.0–100.0)
Platelets: 298 10*3/uL (ref 150–400)
RBC: 5.21 MIL/uL — ABNORMAL HIGH (ref 3.87–5.11)
RDW: 12.9 % (ref 11.5–15.5)
WBC: 11.7 10*3/uL — ABNORMAL HIGH (ref 4.0–10.5)
nRBC: 0 % (ref 0.0–0.2)

## 2021-11-13 LAB — SARS CORONAVIRUS 2 BY RT PCR: SARS Coronavirus 2 by RT PCR: NEGATIVE

## 2021-11-13 LAB — ETHANOL: Alcohol, Ethyl (B): 132 mg/dL — ABNORMAL HIGH (ref <10)

## 2021-11-13 LAB — I-STAT BETA HCG BLOOD, ED (MC, WL, AP ONLY): I-stat hCG, quantitative: <5 m[IU]/mL (ref <5)

## 2021-11-13 LAB — ACETAMINOPHEN LEVEL: Acetaminophen (Tylenol), Serum: <10 ug/mL — ABNORMAL LOW (ref 10–30)

## 2021-11-13 MED ORDER — LURASIDONE HCL 40 MG PO TABS
120.0000 mg | ORAL_TABLET | Freq: Every day | ORAL | Status: DC
  Administered 2021-11-13: 120 mg via ORAL
  Filled 2021-11-13 (×4): qty 3

## 2021-11-13 MED ORDER — PRAZOSIN HCL 2 MG PO CAPS
3.0000 mg | ORAL_CAPSULE | Freq: Every day | ORAL | Status: DC
  Administered 2021-11-13 – 2021-11-16 (×4): 3 mg via ORAL
  Filled 2021-11-13 (×8): qty 1

## 2021-11-13 MED ORDER — LORAZEPAM 1 MG PO TABS: 1.0000 mg | ORAL_TABLET | ORAL | Status: DC | PRN

## 2021-11-13 MED ORDER — HYDROXYZINE HCL 25 MG PO TABS: 50.0000 mg | ORAL_TABLET | Freq: Every evening | ORAL | Status: DC | PRN

## 2021-11-13 MED ORDER — OXCARBAZEPINE 300 MG PO TABS: 300.0000 mg | ORAL_TABLET | Freq: Two times a day (BID) | ORAL | Status: DC

## 2021-11-13 MED ORDER — LORAZEPAM 1 MG PO TABS
1.0000 mg | ORAL_TABLET | Freq: Once | ORAL | Status: AC
  Administered 2021-11-13: 1 mg via ORAL
  Filled 2021-11-13: qty 1

## 2021-11-13 MED ORDER — PRAZOSIN HCL 1 MG PO CAPS: 3.0000 mg | ORAL_CAPSULE | Freq: Every day | ORAL | Status: DC

## 2021-11-13 MED ORDER — LORAZEPAM 2 MG/ML IJ SOLN
2.0000 mg | Freq: Once | INTRAMUSCULAR | Status: AC
  Administered 2021-11-13: 2 mg via INTRAMUSCULAR
  Filled 2021-11-13: qty 1

## 2021-11-13 MED ORDER — ALUM & MAG HYDROXIDE-SIMETH 200-200-20 MG/5ML PO SUSP
30.0000 mL | ORAL | Status: DC | PRN
  Administered 2021-11-16: 30 mL via ORAL
  Filled 2021-11-13 (×2): qty 30

## 2021-11-13 MED ORDER — OXCARBAZEPINE 300 MG PO TABS
300.0000 mg | ORAL_TABLET | Freq: Two times a day (BID) | ORAL | Status: DC
  Administered 2021-11-13 – 2021-11-17 (×8): 300 mg via ORAL
  Filled 2021-11-13 (×13): qty 1

## 2021-11-13 MED ORDER — LURASIDONE HCL 120 MG PO TABS: 120.0000 mg | ORAL_TABLET | Freq: Every day | ORAL | Status: DC

## 2021-11-13 MED ORDER — HYDROXYZINE HCL 50 MG PO TABS
50.0000 mg | ORAL_TABLET | Freq: Every evening | ORAL | Status: DC | PRN
  Administered 2021-11-13: 50 mg via ORAL
  Filled 2021-11-13 (×4): qty 1

## 2021-11-13 MED ORDER — MAGNESIUM HYDROXIDE 400 MG/5ML PO SUSP: 30.0000 mL | Freq: Every day | ORAL | Status: DC | PRN

## 2021-11-13 MED ORDER — ACETAMINOPHEN 325 MG PO TABS
650.0000 mg | ORAL_TABLET | Freq: Four times a day (QID) | ORAL | Status: DC | PRN
  Administered 2021-11-14: 650 mg via ORAL
  Filled 2021-11-13: qty 2

## 2021-11-13 NOTE — ED Notes (Signed)
Report given to rebecca at [facility]

## 2021-11-13 NOTE — ED Notes (Signed)
Pt expressed that "I'm such a coward, I wish I had more medication to kill myself, I did the calculation, and that if I had more gabapentin, it would reach toxicity. If I had an opioid, they have narcan to fix it, but Neurontin, there isn't much you can do. Ill just say I'm not suicidal to be released and then I will take more and figure out a way to kill myself, I don't want to live anymore, I'm tired of being alone. I'm such a coward for not taking more medicine."

## 2021-11-13 NOTE — ED Triage Notes (Signed)
Pt states that she took 25-'600mg'$  gabapentin and drank 3 beers over the last few hours. Pt states that this was intentional.

## 2021-11-13 NOTE — ED Notes (Signed)
Pt ambulatory to restroom

## 2021-11-13 NOTE — ED Notes (Signed)
Called poison control, spoke with Patty.  Per Patty, most common symptoms is dizziness, unsteadiness, and mild hypotension. Watch for 4 hours from arrival.
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
## 2021-11-13 NOTE — Progress Notes (Signed)
Pt was accepted to [facility] 11/13/21; BED ASSIGNMENT 306-1 PENDING: IVC paperwork, VS, BP, and COVID-19  Please fax IVC paperwork to [phone].  Pt meets inpatient criteria per Lindon Romp, NP  Attending Physician will be Dr. Janine Limbo  Report can be called to: Adult unit: [phone]  Pt can arrive after: PENDING ITEMS  Care Team notified: [facility] Lynnda Shields, RN, Lindon Romp, NP. Vladimir Creeks, RN, [place], LCSWA 11/13/2021 @ 1:55 PM

## 2021-11-13 NOTE — ED Notes (Signed)
Pt in bed with eyes closed, resps even and unlabored, sitter observing pt.

## 2021-11-13 NOTE — ED Notes (Signed)
Gina from poison control available at [phone], called, states that pt appears back to baseline and they are closing the case

## 2021-11-13 NOTE — ED Notes (Signed)
1 large grey bag, 1 black handbag, 2 white belonging bags put in lockers 39/40 in [place]

## 2021-11-13 NOTE — ED Notes (Signed)
Pt has about 5 inch by 6 inch areas of cutting to her bilateral thighs, no bleeding noted, dressings placed, pt tearful, pt states that she doesn't want to live past the end of October.  States that she wanted to see her birthday, but yesterday she was chain smoking on her porch and felt like there was no point, pt states that she doesn't think that there is anything anyone can do to make her not want to kill herself.  Pt states that she has one friend and feels very lonely.  Attempted to provide support to pt.  Meal tray given.  Pt contracted for safety.
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
## 2021-11-13 NOTE — ED Provider Notes (Signed)
Emergency Medicine Observation Re-evaluation Note  [name] is a 36 y.o. female, seen on rounds today.  Pt initially presented to the ED for complaints of Drug Overdose Currently, the patient is hemodynamically stable in no acute distress.  Patient ambulatory tolerating p.o. intake.[name]  Physical Exam  BP (!) 123/97 (BP Location: Left Arm)   Pulse 98   Temp 98.6 F (37 C) (Oral)   Resp 15   Ht '5\' 3"'$  (1.6 m)   Wt 96.2 kg   LMP 10/13/2021   SpO2 99%   BMI 37.57 kg/m  Physical Exam General: Appears to be resting comfortably in bed, no acute distress. Cardiac: Regular rate, normal heart rate, non-emergent blood pressure for this morning's vitals. Lungs: No increased work of breathing.  Equal chest rise appreciated Psych: Calm, asleep in bed.   ED Course / MDM  EKG:EKG Interpretation  Date/Time:  Thursday November 13 2021 02:21:01 EDT Ventricular Rate:  105 PR Interval:  133 QRS Duration: 79 QT Interval:  327 QTC Calculation: 433 R Axis:   69 Text Interpretation: Sinus tachycardia Baseline wander in lead(s) V4 Otherwise within normal limits When compared with ECG of 03/06/2021, HEART RATE has increased                          Confirmed by Delora Fuel ([id number]) on 11/13/2021 6:33:16 AM  I have reviewed the labs performed to date as well as medications administered while in observation.    Plan  Current plan is for psychiatric evaluation and reassessment.    Tretha Sciara, MD 11/13/21 [phone]

## 2021-11-13 NOTE — ED Provider Notes (Addendum)
[facility] Emergency Department Provider Note MRN:  [id number]  Arrival date & time: 11/13/21     Chief Complaint   Drug Overdose   History of Present Illness   [name] is a 36 y.o. year-old female presents to the ED with chief complaint of intentional overdose.  She states that she took (25) 600 mg gabapentin.  She states that she took this with intent to kill herself, but states that she is "a wimp for not taking enough to kill her."  She states that she is always alone and wishes to die "so badly."  She also reports drinking alcohol.  She states that she cuts her legs on a daily basis.  History provided by patient.   Review of Systems  Pertinent positive and negative review of systems noted in HPI.    Physical Exam   Vitals:   11/13/21 0510 11/13/21 0544  BP: (!) 109/90   Pulse: 86   Resp: 16   Temp: 98.6 F (37 C)   SpO2: 100% 100%    CONSTITUTIONAL:  anxious-appearing, NAD NEURO:  Alert and oriented x 3, CN 3-12 grossly intact EYES:  eyes equal and reactive ENT/NECK:  Supple, no stridor  CARDIO: Tachycardic, appears well-perfused  PULM:  No respiratory distress,  GI/GU:  non-distended,  MSK/SPINE:  No gross deformities, no edema, moves all extremities  SKIN:  no rash, atraumatic PSYCH: Sobbing during exam   *Additional and/or pertinent findings included in MDM below  Diagnostic and Interventional Summary    EKG Interpretation  Date/Time:    Ventricular Rate:    PR Interval:    QRS Duration:   QT Interval:    QTC Calculation:   R Axis:     Text Interpretation:         Labs Reviewed  COMPREHENSIVE METABOLIC PANEL - Abnormal; Notable for the following components:      Result Value   CO2 20 (*)    Glucose, Bld 109 (*)    Calcium 8.7 (*)    All other components within normal limits  ETHANOL - Abnormal; Notable for the following components:   Alcohol, Ethyl (B) 132 (*)    All other components within normal limits   SALICYLATE LEVEL - Abnormal; Notable for the following components:   Salicylate Lvl <8.5 (*)    All other components within normal limits  ACETAMINOPHEN LEVEL - Abnormal; Notable for the following components:   Acetaminophen (Tylenol), Serum <10 (*)    All other components within normal limits  CBC - Abnormal; Notable for the following components:   WBC 11.7 (*)    RBC 5.21 (*)    Hemoglobin 15.9 (*)    HCT 47.3 (*)    All other components within normal limits  RAPID URINE DRUG SCREEN, HOSP PERFORMED - Abnormal; Notable for the following components:   Cocaine POSITIVE (*)    Tetrahydrocannabinol POSITIVE (*)    All other components within normal limits  I-STAT BETA HCG BLOOD, ED ([facility], [facility], [facility] ONLY)    No orders to display    Medications  LORazepam (ATIVAN) tablet 1 mg (1 mg Oral Given 11/13/21 0520)     Procedures  /  Critical Care Procedures  ED Course and Medical Decision Making  I have reviewed the triage vital signs, the nursing notes, and pertinent available records from the EMR.  Social Determinants Affecting Complexity of Care: Patient has problems living alone.   ED Course: Clinical Course as of 11/13/21 [phone]  Thu Nov 13, 2021  0238 RN spoke with poison control:  Called poison control, spoke with Patty.   "Per Patty, most common symptoms is dizziness, unsteadiness, and mild hypotension. Watch for 4 hours from arrival" [RB]    Clinical Course User Index [RB] Montine Circle, PA-C    Medical Decision Making Patient here after overdosing on gabapentin.  She states that she has no desire to live.  She took the gabapentin with intent to kill herself, but states that she did not take enough.  Poison control was contacted as discussed in ED course.  Patient has been observed for longer than 4 hours.  Labs are reassuring.  Feel that patient is stable for TTS evaluation.  Problems Addressed: Intentional drug overdose, initial encounter [facility]): acute illness or  injury that poses a threat to life or bodily functions Suicide attempt [facility]): acute illness or injury that poses a threat to life or bodily functions  Amount and/or Complexity of Data Reviewed Labs: ordered.  Risk Prescription drug management.     Consultants: TTS consulted.   Treatment and Plan: Dispo per TTS.  Anticipate behavioral health admission.    Final Clinical Impressions(s) / ED Diagnoses     ICD-10-CM   1. Intentional drug overdose, initial encounter ([place])  T50.902A     2. Suicide attempt [facility])  P23.30QT       ED Discharge Orders     None         Discharge Instructions Discussed with and Provided to Patient:   Discharge Instructions   None      Montine Circle, PA-C 11/13/21 0558    Montine Circle, PA-C 11/13/21 0559    Merryl Hacker, MD 11/13/21 [phone]

## 2021-11-13 NOTE — ED Notes (Signed)
Pt in bed, pt yelling and c/o that she would like to use the phone, two nurses at bedside helping to de escalate the pt.  Psyc at bedside.  Pt tearful and states that she would like a phone because she has multiple people she needs to update and talk with, pt states that she doesn't feel it is fair that she can only make three phone calls because she has multiple people to talk with.

## 2021-11-13 NOTE — ED Notes (Signed)
Multiple bags of belongings given to patient and PD. Patient is being transferred to [facility].

## 2021-11-14 ENCOUNTER — Encounter (HOSPITAL_COMMUNITY): Payer: Self-pay | Admitting: Nurse Practitioner

## 2021-11-14 ENCOUNTER — Encounter (HOSPITAL_COMMUNITY): Payer: Self-pay

## 2021-11-14 MED ORDER — ALBUTEROL SULFATE HFA 108 (90 BASE) MCG/ACT IN AERS: 2.0000 | INHALATION_SPRAY | Freq: Four times a day (QID) | RESPIRATORY_TRACT | Status: DC | PRN

## 2021-11-14 MED ORDER — THIAMINE HCL 100 MG/ML IJ SOLN
100.0000 mg | Freq: Once | INTRAMUSCULAR | Status: AC
  Administered 2021-11-14: 100 mg via INTRAMUSCULAR
  Filled 2021-11-14: qty 2

## 2021-11-14 MED ORDER — HYDROXYZINE HCL 25 MG PO TABS
25.0000 mg | ORAL_TABLET | Freq: Four times a day (QID) | ORAL | Status: AC | PRN
  Administered 2021-11-15 – 2021-11-16 (×4): 25 mg via ORAL
  Filled 2021-11-14 (×5): qty 1

## 2021-11-14 MED ORDER — LORAZEPAM 1 MG PO TABS
1.0000 mg | ORAL_TABLET | Freq: Four times a day (QID) | ORAL | Status: DC | PRN
  Filled 2021-11-14: qty 1

## 2021-11-14 MED ORDER — ZIPRASIDONE MESYLATE 20 MG IM SOLR: 20.0000 mg | Freq: Four times a day (QID) | INTRAMUSCULAR | Status: DC | PRN

## 2021-11-14 MED ORDER — LOPERAMIDE HCL 2 MG PO CAPS
2.0000 mg | ORAL_CAPSULE | ORAL | Status: AC | PRN
  Administered 2021-11-16: 2 mg via ORAL
  Filled 2021-11-14: qty 1

## 2021-11-14 MED ORDER — LURASIDONE HCL 20 MG PO TABS
20.0000 mg | ORAL_TABLET | Freq: Every day | ORAL | Status: AC
  Administered 2021-11-16: 20 mg via ORAL
  Filled 2021-11-14: qty 1

## 2021-11-14 MED ORDER — LURASIDONE HCL 40 MG PO TABS
40.0000 mg | ORAL_TABLET | Freq: Every day | ORAL | Status: AC
  Administered 2021-11-15: 40 mg via ORAL
  Filled 2021-11-14 (×2): qty 1

## 2021-11-14 MED ORDER — PALIPERIDONE ER 3 MG PO TB24
3.0000 mg | ORAL_TABLET | Freq: Every day | ORAL | Status: AC
  Administered 2021-11-14 – 2021-11-15 (×2): 3 mg via ORAL
  Filled 2021-11-14 (×3): qty 1

## 2021-11-14 MED ORDER — OLANZAPINE 5 MG PO TABS
5.0000 mg | ORAL_TABLET | Freq: Every day | ORAL | Status: AC
  Administered 2021-11-14: 5 mg via ORAL
  Filled 2021-11-14 (×2): qty 1

## 2021-11-14 MED ORDER — LURASIDONE HCL 60 MG PO TABS
60.0000 mg | ORAL_TABLET | Freq: Every day | ORAL | Status: AC
  Administered 2021-11-14: 60 mg via ORAL
  Filled 2021-11-14: qty 1

## 2021-11-14 MED ORDER — MOMETASONE FURO-FORMOTEROL FUM 200-5 MCG/ACT IN AERO
2.0000 | INHALATION_SPRAY | Freq: Two times a day (BID) | RESPIRATORY_TRACT | Status: DC
  Administered 2021-11-14 – 2021-11-17 (×6): 2 via RESPIRATORY_TRACT
  Filled 2021-11-14 (×2): qty 8.8

## 2021-11-14 MED ORDER — FLUCONAZOLE 150 MG PO TABS
150.0000 mg | ORAL_TABLET | ORAL | Status: DC
  Administered 2021-11-14: 150 mg via ORAL
  Filled 2021-11-14 (×2): qty 1

## 2021-11-14 MED ORDER — DOXYCYCLINE HYCLATE 100 MG PO TABS
100.0000 mg | ORAL_TABLET | Freq: Two times a day (BID) | ORAL | Status: DC
  Administered 2021-11-14 – 2021-11-17 (×6): 100 mg via ORAL
  Filled 2021-11-14 (×10): qty 1

## 2021-11-14 MED ORDER — ONDANSETRON 4 MG PO TBDP: 4.0000 mg | ORAL_TABLET | Freq: Four times a day (QID) | ORAL | Status: AC | PRN

## 2021-11-14 MED ORDER — OLANZAPINE 5 MG PO TBDP
5.0000 mg | ORAL_TABLET | Freq: Three times a day (TID) | ORAL | Status: DC | PRN
  Administered 2021-11-15 – 2021-11-16 (×2): 5 mg via ORAL
  Filled 2021-11-14 (×4): qty 1

## 2021-11-14 MED ORDER — PALIPERIDONE ER 6 MG PO TB24
6.0000 mg | ORAL_TABLET | Freq: Every day | ORAL | Status: DC
  Administered 2021-11-16: 6 mg via ORAL
  Filled 2021-11-14 (×3): qty 1

## 2021-11-14 MED ORDER — PREDNISONE 20 MG PO TABS
20.0000 mg | ORAL_TABLET | Freq: Every day | ORAL | Status: DC
  Administered 2021-11-15 – 2021-11-17 (×3): 20 mg via ORAL
  Filled 2021-11-14 (×5): qty 1

## 2021-11-14 MED ORDER — PRAZOSIN HCL 1 MG PO CAPS
1.0000 mg | ORAL_CAPSULE | Freq: Every day | ORAL | Status: DC
  Administered 2021-11-15 – 2021-11-17 (×3): 1 mg via ORAL
  Filled 2021-11-14 (×5): qty 1

## 2021-11-14 MED ORDER — NICOTINE 21 MG/24HR TD PT24
21.0000 mg | MEDICATED_PATCH | Freq: Every day | TRANSDERMAL | Status: DC
  Administered 2021-11-14 – 2021-11-17 (×4): 21 mg via TRANSDERMAL
  Filled 2021-11-14 (×7): qty 1

## 2021-11-14 MED ORDER — HYDROXYZINE HCL 25 MG PO TABS: 25.0000 mg | ORAL_TABLET | Freq: Three times a day (TID) | ORAL | Status: DC | PRN

## 2021-11-14 MED ORDER — CHLORDIAZEPOXIDE HCL 25 MG PO CAPS
25.0000 mg | ORAL_CAPSULE | Freq: Four times a day (QID) | ORAL | Status: AC | PRN
  Administered 2021-11-14 – 2021-11-16 (×6): 25 mg via ORAL
  Filled 2021-11-14 (×6): qty 1

## 2021-11-14 MED ORDER — ADULT MULTIVITAMIN W/MINERALS CH
1.0000 | ORAL_TABLET | Freq: Every day | ORAL | Status: DC
  Administered 2021-11-14 – 2021-11-17 (×4): 1 via ORAL
  Filled 2021-11-14 (×7): qty 1

## 2021-11-14 NOTE — Progress Notes (Signed)
D: Pt alert and oriented. Pt rates depression 8/10 and anxiety 9/10.  Pt denies experiencing any SI/HI, or AVH at this time.   A: Scheduled medications administered to pt, per MD orders. Support and encouragement provided. Frequent verbal contact made. Routine safety checks conducted q15 minutes.   R: No adverse drug reactions noted. Pt verbally contracts for safety at this time. Pt complaint with medications and treatment plan. Pt interacts well with others on the unit. Pt remains safe at this time. Will continue to monitor.

## 2021-11-14 NOTE — [facility] Group Notes (Signed)
Adult Psychoeducational Group Note  Date:  11/14/2021 Time:  2:42 PM  Group Topic/Focus:  Wellness Toolbox:   The focus of this group is to discuss various aspects of wellness, balancing those aspects and exploring ways to increase the ability to experience wellness.  Patients will create a wellness toolbox for use upon discharge.  Participation Level:  Active  Participation Quality:  Appropriate  Affect:  Appropriate  Cognitive:  Appropriate  Insight: Appropriate  Engagement in Group:  Engaged  Modes of Intervention:  Activity  Additional Comments:  Patient attended and participated in the therapeutic group activity.  [name] 11/14/2021, 2:42 PM

## 2021-11-14 NOTE — [facility] Suicide Risk Assessment (Signed)
Suicide Risk Assessment  Admission Assessment    [facility] Admission Suicide Risk Assessment   Nursing information obtained from:    Demographic factors:  Caucasian Current Mental Status:  Suicidal ideation indicated by patient Loss Factors:  NA Historical Factors:  Prior suicide attempts Risk Reduction Factors:  NA  Total Time spent with patient: 45 minutes Principal Problem: Bipolar affective disorder ([place]) Diagnosis:  Principal Problem:   Bipolar affective disorder ([place]) Active Problems:   Borderline personality disorder in adult [facility])   PTSD (post-traumatic stress disorder)  Subjective Data:  [name] is a 36 year old female with a past psychiatric history of borderline personality disorder, bipolar II, PTSD, and multiple past psychiatric admissions for suicidality who presented involuntarily from [place] ED after a suicide attempt with an intentional overdose of 25 600 mg gabapentin capsules and 3 beers.     On Chart Review: Patient with at least 8 prior inpatient psychiatric admissions, dating back to 2004.  Also with multiple suicide attempts.  This presentation is very similar to her presentation in February 2023 in which she reported that she was lonely and wanted to kill herself.  She is recently engaged in self-harm behaviors via cutting, per PCP visit on 10/11 in which she was prescribed doxycycline for cellulitis.  Continued Clinical Symptoms:    The "Alcohol Use Disorders Identification Test", Guidelines for Use in Primary Care, Second Edition.  World Pharmacologist [facility]). Score between 0-7:  no or low risk or alcohol related problems. Score between 8-15:  moderate risk of alcohol related problems. Score between 16-19:  high risk of alcohol related problems. Score 20 or above:  warrants further diagnostic evaluation for alcohol dependence and treatment.   CLINICAL FACTORS:   Severe Anxiety and/or Agitation Bipolar Disorder:   Bipolar II Depression:    Anhedonia Comorbid alcohol abuse/dependence Hopelessness Impulsivity Insomnia Severe Alcohol/Substance Abuse/Dependencies Personality Disorders:   Cluster B Comorbid alcohol abuse/dependence Comorbid depression More than one psychiatric diagnosis Unstable or Poor Therapeutic Relationship Previous Psychiatric Diagnoses and Treatments   Musculoskeletal: Strength & Muscle Tone: within normal limits Gait & Station: normal Patient leans: N/A  Psychiatric Specialty Exam:  Presentation  General Appearance:  Appropriate for Environment; Casual (Multiple facial piercings)  Eye Contact: Fair  Speech: Clear and Coherent; Normal Rate  Speech Volume: Normal  Handedness: Right   Mood and Affect  Mood: Anxious; Hopeless; Worthless; Depressed  Affect: Congruent; Depressed; Labile; Tearful (Multiple instances of crying with no tears produced)   Thought Process  Thought Processes: Goal Directed  Descriptions of Associations:Intact  Orientation:Full (Time, Place and Person)  Thought Content:Rumination  History of Schizophrenia/Schizoaffective disorder:No  Duration of Psychotic Symptoms:N/A  Hallucinations:Hallucinations: None Description of Visual Hallucinations: ghosts and fairies  Ideas of Reference:None  Suicidal Thoughts:Suicidal Thoughts: Yes, Active SI Active Intent and/or Plan: With Intent; Without Plan  Homicidal Thoughts:Homicidal Thoughts: No   Sensorium  Memory: Immediate Fair; Remote Poor; Recent Poor  Judgment: Impaired  Insight: Lacking   Executive Functions  Concentration: Poor  Attention Span: Poor  Recall: Poor  Fund of Knowledge: Fair  Language: Fair   Psychomotor Activity  Psychomotor Activity: Psychomotor Activity: Restlessness   Assets  Assets: Housing; Social Support   Sleep  Sleep: Sleep: Poor    Physical Exam: Constitutional:      General: She is not in acute distress.    Appearance: She is  not toxic-appearing.     Comments: Multiple facial piercings  HENT:     Head: Normocephalic and atraumatic.  Mouth/Throat:     Mouth: Mucous membranes are moist.     Pharynx: Oropharynx is clear.  Pulmonary:     Effort: Pulmonary effort is normal.  Skin:    General: Skin is warm and dry.  Neurological:     General: No focal deficit present.     Mental Status: She is alert and oriented to person, place, and time.     Motor: No weakness.      Review of Systems  Constitutional:  Negative for fever.  Respiratory:  Positive for cough.   Cardiovascular:  Negative for chest pain.  Gastrointestinal: Negative.   Genitourinary: Negative.   Neurological:  Negative for seizures and headaches.  Blood pressure 120/83, pulse 93, temperature 98 F (36.7 C), temperature source Oral, resp. rate 17, height '5\' 2"'$  (1.575 m), weight 94.8 kg, last menstrual period 10/13/2021, SpO2 100 %. Body mass index is 38.23 kg/m.   COGNITIVE FEATURES THAT CONTRIBUTE TO RISK:  Thought constriction (tunnel vision)    SUICIDE RISK:   Severe:  Frequent, intense, and enduring suicidal ideation, specific plan, no subjective intent, but some objective markers of intent (i.e., choice of lethal method), the method is accessible, some limited preparatory behavior, evidence of impaired self-control, severe dysphoria/symptomatology, multiple risk factors present, and few if any protective factors, particularly a lack of social support.  PLAN OF CARE:   Patient with one-to-one sitter due to suicidal ideation with intent while hospitalized.  See H&P for full plan of care.  I certify that inpatient services furnished can reasonably be expected to improve the patient's condition.   Rosezetta Schlatter, MD 11/14/2021, 5:39 PM

## 2021-11-14 NOTE — Progress Notes (Signed)
Adult Psychoeducational Group Note  Date:  11/14/2021 Time:  9:29 PM  Group Topic/Focus:  AA Group  Participation Level:  Active  Participation Quality:  Appropriate  Affect:  Appropriate  Cognitive:  Appropriate  Insight: Appropriate  Engagement in Group:  Engaged  Modes of Intervention:  Discussion and Support  Additional Comments:   Pt attended and was attentive during the [place] group.  [name] 11/14/2021, 9:29 PM

## 2021-11-14 NOTE — Progress Notes (Signed)
Pt sitting in dayroom in no distress.  Vitals obtained.  Diastolic still high at 174.  Heart rate 96.  Will continue to monitor.

## 2021-11-14 NOTE — Plan of Care (Signed)
  Problem: Health Behavior/Discharge Planning: Goal: Compliance with treatment plan for underlying cause of condition will improve Outcome: Progressing   Problem: Health Behavior/Discharge Planning: Goal: Identification of resources available to assist in meeting health care needs will improve Outcome: Progressing   Problem: Education: Goal: Emotional status will improve Outcome: Not Progressing Goal: Mental status will improve Outcome: Not Progressing Goal: Verbalization of understanding the information provided will improve Outcome: Not Progressing   Problem: Coping: Goal: Ability to demonstrate self-control will improve Outcome: Not Progressing   Problem: Coping: Goal: Coping ability will improve Outcome: Not Progressing

## 2021-11-14 NOTE — Progress Notes (Signed)
1:1 Note  Doctor Clotilde Dieter notified this morning that pt needs 1:1 to ensure safety. Pt in the dayroom talking to pharmacist at this time. Pt took Librium and other morning meds without incident. Will continue to closely monitor pt and intervene as needed.

## 2021-11-14 NOTE — Group Note (Signed)
Recreation Therapy Group Note   Group Topic:Team Building  Group Date: 11/14/2021 Start Time: 0930 End Time: 0955 Facilitators: Jvion Turgeon-McCall, LRT,CTRS Location: [address] Dayroom   Goal Area(s) Addresses:  Patient will effectively work with peer towards shared goal.  Patient will identify skills used to make activity successful.  Patient will identify how skills used during activity can be applied to reach post d/c goals.   Group Description: [employer]. In teams of 5-6, patients were given 12 craft pipe cleaners. Using the materials provided, patients were instructed to compete again the opposing team(s) to build the tallest free-standing structure from floor level. The activity was timed; difficulty increased by Probation officer as Pharmacist, hospital continued.  Systematically resources were removed with additional directions for example, placing one arm behind their back, working in silence, and shape stipulations. LRT facilitated post-activity discussion reviewing team processes and necessary communication skills involved in completion. Patients were encouraged to reflect how the skills utilized, or not utilized, in this activity can be incorporated to positively impact support systems post discharge.   Affect/Mood: N/A   Participation Level: Did not attend    Clinical Observations/Individualized Feedback:     Plan: Continue to engage patient in RT group sessions 2-3x/week.   Taysean Wager-McCall, LRT,CTRS 11/14/2021 10:47 AM

## 2021-11-14 NOTE — BH IP Treatment Plan (Signed)
Interdisciplinary Treatment and Diagnostic Plan Update  11/14/2021 Time of Session: [place] MRN: [id number]  Principal Diagnosis: Bipolar affective disorder [facility])  Secondary Diagnoses: Principal Problem:   Bipolar affective disorder ([place])   Current Medications:  Current Facility-Administered Medications  Medication Dose Route Frequency Provider Last Rate Last Admin   acetaminophen (TYLENOL) tablet 650 mg  650 mg Oral Q6H PRN Rozetta Nunnery, NP       alum & mag hydroxide-simeth (MAALOX/MYLANTA) 200-200-20 MG/5ML suspension 30 mL  30 mL Oral Q4H PRN Lindon Romp A, NP       chlordiazePOXIDE (LIBRIUM) capsule 25 mg  25 mg Oral Q6H PRN Rosezetta Schlatter, MD   25 mg at 11/14/21 1012   doxycycline (VIBRA-TABS) tablet 100 mg  100 mg Oral Q12H Rosezetta Schlatter, MD       fluconazole (DIFLUCAN) tablet 150 mg  150 mg Oral Q3 days Rosezetta Schlatter, MD       hydrOXYzine (ATARAX) tablet 25 mg  25 mg Oral Q6H PRN Rosezetta Schlatter, MD       hydrOXYzine (ATARAX) tablet 50 mg  50 mg Oral QHS PRN Lindon Romp A, NP   50 mg at 11/13/21 2029   loperamide (IMODIUM) capsule 2-4 mg  2-4 mg Oral PRN Rosezetta Schlatter, MD       OLANZapine zydis (ZYPREXA) disintegrating tablet 5 mg  5 mg Oral Q8H PRN Massengill, Ovid Curd, MD       And   LORazepam (ATIVAN) tablet 1 mg  1 mg Oral Q6H PRN Massengill, Ovid Curd, MD       And   ziprasidone (GEODON) injection 20 mg  20 mg Intramuscular Q6H PRN Massengill, Ovid Curd, MD       lurasidone (LATUDA) tablet 120 mg  120 mg Oral QHS Lindon Romp A, NP   120 mg at 11/13/21 2028   magnesium hydroxide (MILK OF MAGNESIA) suspension 30 mL  30 mL Oral Daily PRN Rozetta Nunnery, NP       multivitamin with minerals tablet 1 tablet  1 tablet Oral Daily Rosezetta Schlatter, MD   1 tablet at 11/14/21 1012   nicotine (NICODERM CQ - dosed in mg/24 hours) patch 21 mg  21 mg Transdermal Daily Rosezetta Schlatter, MD   21 mg at 11/14/21 1237   ondansetron (ZOFRAN-ODT) disintegrating tablet 4 mg  4 mg  Oral Q6H PRN Rosezetta Schlatter, MD       Oxcarbazepine (TRILEPTAL) tablet 300 mg  300 mg Oral BID Lindon Romp A, NP   300 mg at 11/14/21 0917   prazosin (MINIPRESS) capsule 3 mg  3 mg Oral QHS Lindon Romp A, NP   3 mg at 11/13/21 2028   [START ON 11/15/2021] predniSONE (DELTASONE) tablet 20 mg  20 mg Oral Q breakfast Rosezetta Schlatter, MD       PTA Medications: Medications Prior to Admission  Medication Sig Dispense Refill Last Dose   albuterol (VENTOLIN HFA) 108 (90 Base) MCG/ACT inhaler Inhale 2 puffs into the lungs every 6 (six) hours as needed for wheezing or shortness of breath. 8 g 2    budesonide-formoterol (SYMBICORT) 160-4.5 MCG/ACT inhaler Inhale 2 puffs into the lungs in the morning and at bedtime. 1 each 5    calcium carbonate (OS-CAL - DOSED IN MG OF ELEMENTAL CALCIUM) 1250 (500 Ca) MG tablet Take 1 tablet by mouth daily with breakfast.      Ferrous Sulfate (IRON PO) Take 1 tablet by mouth daily.      fluticasone (FLONASE) 50  MCG/ACT nasal spray Place 2 sprays into both nostrils daily. (Patient taking differently: Place 2 sprays into both nostrils at bedtime.) 1 g 5    gabapentin (NEURONTIN) 600 MG tablet Take 1 tablet (600 mg total) by mouth 3 (three) times daily. 90 tablet 1    hydrOXYzine (VISTARIL) 50 MG capsule Take 1 capsule (50 mg total) by mouth at bedtime as needed. (Patient taking differently: Take 50 mg by mouth 3 (three) times daily as needed for anxiety (sleep).) 30 capsule 1    LATUDA 120 MG TABS Take 1 tablet (120 mg total) by mouth at bedtime. 30 tablet 1    levonorgestrel-ethinyl estradiol (NORDETTE) 0.15-30 MG-MCG tablet Take 1 tablet by mouth at bedtime.      montelukast (SINGULAIR) 10 MG tablet Take 1 tablet (10 mg total) by mouth at bedtime. 30 tablet 1    Multiple Vitamin (MULTIVITAMIN WITH MINERALS) TABS tablet Take 1 tablet by mouth daily. 30 tablet 0    omeprazole (PRILOSEC) 40 MG capsule Take 1 capsule (40 mg total) by mouth 2 (two) times daily. 60 capsule 5     Oxcarbazepine (TRILEPTAL) 300 MG tablet Take 1 tablet (300 mg total) by mouth 2 (two) times daily. 60 tablet 1    prazosin (MINIPRESS) 1 MG capsule Take 3 capsules (3 mg total) by mouth at bedtime. 90 capsule 1    prazosin (MINIPRESS) 1 MG capsule Take 1 mg by mouth daily.      albuterol (PROVENTIL) (2.5 MG/3ML) 0.083% nebulizer solution Take 3 mLs (2.5 mg total) by nebulization every 6 (six) hours as needed for wheezing or shortness of breath. 150 mL 1    doxycycline (VIBRA-TABS) 100 MG tablet Take 1 tablet (100 mg total) by mouth 2 (two) times daily. 20 tablet 0    fluconazole (DIFLUCAN) 150 MG tablet Take 1 tablet (150 mg total) by mouth every 3 (three) days. 2 tablet 0    NYSTATIN-TRIAMCINOLONE EX Apply 1 application topically as needed (For yeast infections). (Patient not taking: Reported on 11/14/2021)   Not Taking   predniSONE (DELTASONE) 10 MG tablet 2 tabs by mouth per day for 5 days 10 tablet 0     Patient Stressors:    Patient Strengths:    Treatment Modalities: Medication Management, Group therapy, Case management,  1 to 1 session with clinician, Psychoeducation, Recreational therapy.   Physician Treatment Plan for Primary Diagnosis: Bipolar affective disorder ([place]) Long Term Goal(s):     Short Term Goals:    Medication Management: Evaluate patient's response, side effects, and tolerance of medication regimen.  Therapeutic Interventions: 1 to 1 sessions, Unit Group sessions and Medication administration.  Evaluation of Outcomes: Progressing  Physician Treatment Plan for Secondary Diagnosis: Principal Problem:   Bipolar affective disorder ([place])  Long Term Goal(s):     Short Term Goals:       Medication Management: Evaluate patient's response, side effects, and tolerance of medication regimen.  Therapeutic Interventions: 1 to 1 sessions, Unit Group sessions and Medication administration.  Evaluation of Outcomes: Progressing   RN Treatment Plan for Primary  Diagnosis: Bipolar affective disorder ([place]) Long Term Goal(s): Knowledge of disease and therapeutic regimen to maintain health will improve  Short Term Goals: Ability to remain free from injury will improve, Ability to verbalize frustration and anger appropriately will improve, Ability to demonstrate self-control, Ability to participate in decision making will improve, Ability to verbalize feelings will improve, Ability to disclose and discuss suicidal ideas, Ability to identify and develop effective coping  behaviors will improve, and Compliance with prescribed medications will improve  Medication Management: RN will administer medications as ordered by provider, will assess and evaluate patient's response and provide education to patient for prescribed medication. RN will report any adverse and/or side effects to prescribing provider.  Therapeutic Interventions: 1 on 1 counseling sessions, Psychoeducation, Medication administration, Evaluate responses to treatment, Monitor vital signs and CBGs as ordered, Perform/monitor CIWA, COWS, AIMS and Fall Risk screenings as ordered, Perform wound care treatments as ordered.  Evaluation of Outcomes: Progressing   [name] Treatment Plan for Primary Diagnosis: Bipolar affective disorder ([place]) Long Term Goal(s): Safe transition to appropriate next level of care at discharge, Engage patient in therapeutic group addressing interpersonal concerns.  Short Term Goals: Engage patient in aftercare planning with referrals and resources, Increase social support, Increase ability to appropriately verbalize feelings, Increase emotional regulation, Facilitate acceptance of mental health diagnosis and concerns, Facilitate patient progression through stages of change regarding substance use diagnoses and concerns, Identify triggers associated with mental health/substance abuse issues, and Increase skills for wellness and recovery  Therapeutic Interventions: Assess for all  discharge needs, 1 to 1 time with Social worker, Explore available resources and support systems, Assess for adequacy in community support network, Educate family and significant other(s) on suicide prevention, Complete Psychosocial Assessment, Interpersonal group therapy.  Evaluation of Outcomes: Progressing   Progress in Treatment: Attending groups: Yes. Participating in groups: Yes. Taking medication as prescribed: Yes. Toleration medication: Yes. Family/Significant other contact made: No, will contact:  CSW to obtain consent to reach family/friend. Patient understands diagnosis: Yes. Discussing patient identified problems/goals with staff: Yes. Medical problems stabilized or resolved: Yes. Denies suicidal/homicidal ideation: No. Issues/concerns per patient self-inventory: Yes. Other: none  New problem(s) identified: No, Describe:  none  New Short Term/Long Term Goal(s): Patient to work towards detox, medication management for mood stabilization; elimination of SI thoughts; development of comprehensive mental wellness/sobriety plan.  Patient Goals:  Patient states their goal for treatment is to "I want to die."  Discharge Plan or Barriers: No psychosocial barriers identified at this time, patient to return to place of residence when appropriate for discharge.   Reason for Continuation of Hospitalization: Depression Medication stabilization Suicidal ideation  Estimated Length of Stay: 1-7 days   Last 3 [place] Suicide Severity Risk Score: [place] Admission (Current) from 11/13/2021 in [place] [facility] Most recent reading at 11/13/2021 11:00 PM ED from 11/13/2021 in [place] DEPT Most recent reading at 11/13/2021  1:37 AM Video Visit from 09/22/2021 in [place] Most recent reading at 09/22/2021  3:36 PM  C-SSRS RISK CATEGORY High Risk High Risk No Risk       Last PHQ  2/9 Scores:    10/09/2021    3:35 PM 08/26/2021    9:24 AM 03/26/2021   11:29 AM  Depression screen PHQ 2/9  Decreased Interest 0 3 1  Down, Depressed, Hopeless 0 3 0  PHQ - 2 Score 0 6 1  Altered sleeping  3 1  Tired, decreased energy  3 2  Change in appetite  3 1  Feeling bad or failure about yourself   3 1  Trouble concentrating  3 1  Moving slowly or fidgety/restless  3 1  Suicidal thoughts  2 0  PHQ-9 Score  26 8  Difficult doing work/chores  Extremely dIfficult Somewhat difficult    Scribe for Treatment Team: Larose Kells 11/14/2021 2:57 PM

## 2021-11-14 NOTE — Progress Notes (Signed)
1:1 Note.  Pt is in no acute distress.  Pt's 1:1 sitter monitoring pt.  No issues at this time.

## 2021-11-14 NOTE — Group Note (Signed)
[facility] [name] Group Therapy Note   Group Date: 11/14/2021 Start Time: 1300 End Time: 1400   Type of Therapy/Topic:  Group Therapy:  Emotion Regulation  Participation Level:  Active   Mood: Labile   Description of Group:    The purpose of this group is to assist patients in learning to regulate negative emotions and experience positive emotions. Patients will be guided to discuss ways in which they have been vulnerable to their negative emotions. These vulnerabilities will be juxtaposed with experiences of positive emotions or situations, and patients challenged to use positive emotions to combat negative ones. Special emphasis will be placed on coping with negative emotions in conflict situations, and patients will process healthy conflict resolution skills.  Therapeutic Goals: Patient will identify two positive emotions or experiences to reflect on in order to balance out negative emotions:  Patient will label two or more emotions that they find the most difficult to experience:  Patient will be able to demonstrate positive conflict resolution skills through discussion or role plays:   Summary of Patient Progress: Patient was present for the entirety of the group session. Patient was an active listener and participated in the topic of discussion, provided helpful advice to others, and added nuance to topic of conversation. Patient shared that she has difficulty implementing a schedule in her life due to lack of funds and social supports.     Therapeutic Modalities:   Cognitive Behavioral Therapy Feelings Identification Dialectical Behavioral Therapy   Durenda Hurt, [place]

## 2021-11-14 NOTE — Progress Notes (Signed)
Nurse reported pt had bowel incontinence episode. Pt stated it has been ongoing having aprox 5 bowel movements today. Pt complains her bowel movements have been watery.

## 2021-11-14 NOTE — Progress Notes (Signed)
Nursing 1:1 Note   D- Patient alert and oriented. Patient promotes passive SI with no plan, but verbally contracts for safety, stating "I feel better having the one on one care." Denies HI, AVH, and pain. This RN assess the scratch marks on the patient's legs. Wounds appear dry and clean, while slightly pink. Marks extend from top of thigh to just just above the knee and from the outer thigh to the inner thigh. Patient requested a bandage to be placed on them because "They open up when I sleep."   A- Scheduled medications administered to patient, per MD orders. Support and encouragement provided. 1:1 observation remains.  Patient informed to notify staff with problems or concerns. ABD bandages placed onto each thigh. This RN educated patient on both scheduled and PRN medications, answering questions that the patient had. Patient encouraged to shower and clean the scratch marks on leg.  R- No adverse drug reactions noted. Patient contracts for safety at this time. Patient compliant with medications and treatment plan. Patient expressed gratitude for education and bandaging. Patient receptive, calm, and cooperative. Patient interacts well with others on the unit.  Patient remains safe at this time.

## 2021-11-14 NOTE — H&P (Cosign Needed Addendum)
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 assault  2. Psychiatric Diagnoses and Treatment # Bipolar affective disorder #GAD #PTSD -Begin cross taper from home Latuda 120 mg to Invega p.o.: Latuda 60 mg tonight, followed by 40 mg tomorrow night, followed by 20 mg Sunday night, then discontinue.  And Invega 3 mg x 2 doses, followed by 6 mg, which will continue. -Continue home Trileptal 300 mg twice daily - Continue home Minipress 1 mg daily and 3 mg nightly  PRN: Zofran, Milk of Magnesia, Imodium, Maalox, and Tylenol.  Agitation: Zyprexa 5 mg, Geodon 20 mg IM, and Ativan 1 mg p.o.  -- The risks/benefits/side-effects/alternatives to this medication were discussed in detail with the patient and time was given for questions. The patient consents to  medication trial.              -- Metabolic profile and EKG monitoring obtained while on an atypical antipsychotic  BMI: 38.23 Lipid Panel: WNL on 10/11 TSH: 2.41 on 10/11 HbgA1c: 5.6% on 10/11 QTc: 433             -- Encouraged patient to participate in unit milieu and in scheduled group therapies     3. Medical Issues Being Addressed: #Alcohol use disorder Patient with reported history of 2 to 320 ounce beers 3 to 4 days/week, with last drink just prior  to admission.  Ethanol 132 on admission. -CIWA protocol for monitoring of withdrawal with IM thiamine x 1 and MVI replacement and Librium 25 mg every 6 hours for scores >10, and hydroxyzine 25 mg for CIWA scores <10.    #Cellulitis from cuts to thighs -Resume previously prescribed doxycycline 100 mg twice daily x 10 days - Resume previously prescribed Diflucan 150 mg every 3 days x 3 doses  #Acute asthmatic bronchitis - Resume home rescue inhaler and Dulera 2 puffs twice daily (equivalent of home Symbicort) - Resume previously prescribed prednisone 20 mg daily x 5 doses  4. Discharge Planning:              -- Social work and case management to assist with discharge planning and identification of hospital follow-up needs prior to discharge             -- Estimated LOS: 5-7 days             -- Discharge Concerns: Need to establish a safety plan; Medication compliance and effectiveness             -- Discharge Goals: Return home with outpatient referrals for mental health follow-up including medication management/psychotherapy    Rosezetta Schlatter, MD 10/13/20235:24 PM

## 2021-11-15 DIAGNOSIS — F3130 Bipolar disorder, current episode depressed, mild or moderate severity, unspecified: Secondary | ICD-10-CM

## 2021-11-15 MED ORDER — TRAZODONE 25 MG HALF TABLET
25.0000 mg | ORAL_TABLET | Freq: Every day | ORAL | Status: DC
  Administered 2021-11-15: 25 mg via ORAL
  Filled 2021-11-15 (×3): qty 1

## 2021-11-15 MED ORDER — ENSURE ENLIVE PO LIQD
237.0000 mL | Freq: Two times a day (BID) | ORAL | Status: DC
  Administered 2021-11-16 – 2021-11-17 (×3): 237 mL via ORAL
  Filled 2021-11-15 (×7): qty 237

## 2021-11-15 MED ORDER — BACITRACIN-NEOMYCIN-POLYMYXIN OINTMENT TUBE
TOPICAL_OINTMENT | Freq: Two times a day (BID) | CUTANEOUS | Status: DC
  Filled 2021-11-15: qty 14.17

## 2021-11-15 NOTE — Progress Notes (Signed)
   11/15/21 1000  Psych Admission Type (Psych Patients Only)  Admission Status Involuntary  Psychosocial Assessment  Patient Complaints Anxiety;Depression  Eye Contact Fair  Facial Expression Anxious  Affect Anxious  Speech Logical/coherent  Interaction Needy  Motor Activity Fidgety  Appearance/Hygiene Unremarkable  Behavior Characteristics Anxious;Fidgety  Mood Anxious  Thought Process  Coherency WDL  Content WDL  Delusions None reported or observed  Perception WDL  Hallucination None reported or observed  Judgment WDL  Confusion None  Danger to Self  Current suicidal ideation? Denies  Danger to Others  Danger to Others None reported or observed

## 2021-11-15 NOTE — Progress Notes (Addendum)
[facility] MD Progress Note  11/15/2021 1:27 PM [name]  MRN:  [id number] Subjective:   [name] is a 36 year old female with a past psychiatric history of bipolar affective disorder and borderline personality disorder.  She presented to care after reported overdose on 25 tablets of 600 mg gabapentin.  Her primary stressor related to this event involved her disability benefits being cut in half.  She is admitted on a involuntary basis.  On interview and assessment this morning the patient complains about her close not being washed in a timely manner.  When asked how she is dealing with his frustration she says "I do not know".  She reports poor sleep and says that she feels tired she is tearful at many points throughout the interview.  The patient states that she does not need a one-to-one and feels that she can be safe without supervision.  Per nursing report, the patient did well over the previous 24 hours with her one-to-one.  The patient contracts for safety and says that she can inform staff if she wants to harm herself.  She reports last experiencing suicidal thoughts yesterday, feeling like she wanted to die.  The patient denies experiencing auditory or visual hallucinations.  She denies experiencing homicidal thoughts.  Principal Problem: Bipolar affective disorder ([place]) Diagnosis: Principal Problem:   Bipolar affective disorder ([place]) Active Problems:   Borderline personality disorder in adult [facility])   PTSD (post-traumatic stress disorder)  Total Time spent with patient: 20 minutes  Past Psychiatric History: as above  Past Medical History:  Past Medical History:  Diagnosis Date   Allergy    Anxiety    Asthma    Bipolar 2 disorder ([place])    Borderline personality disorder ([place])    Depression    Eczema    Former smoker    Obesity    PTSD (post-traumatic stress disorder)    Urticaria     Past Surgical History:  Procedure Laterality Date   Thumb surgery Left    Family  History:  Family History  Problem Relation Age of Onset   Healthy Mother    Healthy Father    Alzheimer's disease Maternal Grandmother    Skin cancer Paternal Grandmother    Allergic rhinitis Paternal Grandmother    Asthma Paternal Grandmother    Angioedema Neg Hx    Eczema Neg Hx    Urticaria Neg Hx    Family Psychiatric  History: per H and P Social History:  Social History   Substance and Sexual Activity  Alcohol Use Yes   Alcohol/week: 8.0 standard drinks of alcohol   Types: 8 Cans of beer per week   Comment: occ     Social History   Substance and Sexual Activity  Drug Use Yes   Frequency: 4.0 times per week   Types: Marijuana   Comment: Last smoked this morning-03/09/19    Social History   Socioeconomic History   Marital status: Single    Spouse name: Not on file   Number of children: 0   Years of education: 13   Highest education level: Not on file  Occupational History   Not on file  Tobacco Use   Smoking status: Some Days    Packs/day: 0.50    Years: 9.00    Total pack years: 4.50    Types: Cigarettes    Last attempt to quit: 12/04/2015    Years since quitting: 5.9    Passive exposure: Current   Smokeless tobacco: Never  Vaping Use   Vaping Use: Former  Substance and Sexual Activity   Alcohol use: Yes    Alcohol/week: 8.0 standard drinks of alcohol    Types: 8 Cans of beer per week    Comment: occ   Drug use: Yes    Frequency: 4.0 times per week    Types: Marijuana    Comment: Last smoked this morning-03/09/19   Sexual activity: Yes    Birth control/protection: Pill  Other Topics Concern   Not on file  Social History Narrative   Fun: Play video games and watch movies.   Denies abuse and feels safe at home.   Social Determinants of Health   Financial Resource Strain: Not on file  Food Insecurity: Food Insecurity Present (11/13/2021)   Hunger Vital Sign    Worried About Running Out of Food in the Last Year: Sometimes true    Ran Out of Food  in the Last Year: Sometimes true  Transportation Needs: Unmet Transportation Needs (11/13/2021)   PRAPARE - Hydrologist (Medical): Yes    Lack of Transportation (Non-Medical): Yes  Physical Activity: Not on file  Stress: Not on file  Social Connections: Not on file   Additional Social History:                         Sleep: Fair  Appetite:  Fair  Current Medications: Current Facility-Administered Medications  Medication Dose Route Frequency Provider Last Rate Last Admin   acetaminophen (TYLENOL) tablet 650 mg  650 mg Oral Q6H PRN Rozetta Nunnery, NP   650 mg at 11/14/21 2010   albuterol (VENTOLIN HFA) 108 (90 Base) MCG/ACT inhaler 2 puff  2 puff Inhalation Q6H PRN Rosezetta Schlatter, MD       alum & mag hydroxide-simeth (MAALOX/MYLANTA) 200-200-20 MG/5ML suspension 30 mL  30 mL Oral Q4H PRN Lindon Romp A, NP       chlordiazePOXIDE (LIBRIUM) capsule 25 mg  25 mg Oral Q6H PRN Rosezetta Schlatter, MD   25 mg at 11/14/21 2204   doxycycline (VIBRA-TABS) tablet 100 mg  100 mg Oral Q12H Rosezetta Schlatter, MD   100 mg at 11/15/21 0949   fluconazole (DIFLUCAN) tablet 150 mg  150 mg Oral Q3 days Rosezetta Schlatter, MD   150 mg at 11/14/21 2006   hydrOXYzine (ATARAX) tablet 25 mg  25 mg Oral Q6H PRN Rosezetta Schlatter, MD   25 mg at 11/15/21 0954   hydrOXYzine (ATARAX) tablet 50 mg  50 mg Oral QHS PRN Rozetta Nunnery, NP   50 mg at 11/13/21 2029   loperamide (IMODIUM) capsule 2-4 mg  2-4 mg Oral PRN Rosezetta Schlatter, MD       OLANZapine zydis (ZYPREXA) disintegrating tablet 5 mg  5 mg Oral Q8H PRN Massengill, Ovid Curd, MD   5 mg at 11/15/21 1120   And   LORazepam (ATIVAN) tablet 1 mg  1 mg Oral Q6H PRN Massengill, Ovid Curd, MD       And   ziprasidone (GEODON) injection 20 mg  20 mg Intramuscular Q6H PRN Massengill, Ovid Curd, MD       lurasidone (LATUDA) tablet 40 mg  40 mg Oral QHS Rosezetta Schlatter, MD       Followed by   Derrill Memo ON 11/16/2021] lurasidone (LATUDA) tablet 20 mg   20 mg Oral QHS Rosezetta Schlatter, MD       magnesium hydroxide (MILK OF MAGNESIA) suspension 30 mL  30 mL Oral  Daily PRN Lindon Romp A, NP       mometasone-formoterol (DULERA) 200-5 MCG/ACT inhaler 2 puff  2 puff Inhalation BID Rosezetta Schlatter, MD   2 puff at 11/15/21 0950   multivitamin with minerals tablet 1 tablet  1 tablet Oral Daily Rosezetta Schlatter, MD   1 tablet at 11/15/21 4696   neomycin-bacitracin-polymyxin (NEOSPORIN) ointment   Topical BID Corky Sox, MD       nicotine (NICODERM CQ - dosed in mg/24 hours) patch 21 mg  21 mg Transdermal Daily Rosezetta Schlatter, MD   21 mg at 11/15/21 0952   ondansetron (ZOFRAN-ODT) disintegrating tablet 4 mg  4 mg Oral Q6H PRN Rosezetta Schlatter, MD       Oxcarbazepine (TRILEPTAL) tablet 300 mg  300 mg Oral BID Lindon Romp A, NP   300 mg at 11/15/21 0950   paliperidone (INVEGA) 24 hr tablet 3 mg  3 mg Oral QHS Rosezetta Schlatter, MD   3 mg at 11/14/21 2206   Followed by   Derrill Memo ON 11/16/2021] paliperidone (INVEGA) 24 hr tablet 6 mg  6 mg Oral QHS Rosezetta Schlatter, MD       prazosin (MINIPRESS) capsule 1 mg  1 mg Oral Daily Rosezetta Schlatter, MD   1 mg at 11/15/21 0949   prazosin (MINIPRESS) capsule 3 mg  3 mg Oral QHS Lindon Romp A, NP   3 mg at 11/14/21 2205   predniSONE (DELTASONE) tablet 20 mg  20 mg Oral Q breakfast Rosezetta Schlatter, MD   20 mg at 11/15/21 2952    Lab Results: No results found for this or any previous visit (from the past 110 hour(s)).  Blood Alcohol level:  Lab Results  Component Value Date   ETH 132 (H) 11/13/2021   ETH <10 84/13/2440    Metabolic Disorder Labs: Lab Results  Component Value Date   HGBA1C 5.6 11/12/2021   MPG 111 03/05/2021   MPG 102.54 12/20/2020   No results found for: "PROLACTIN" Lab Results  Component Value Date   CHOL 177 11/12/2021   TRIG 90.0 11/12/2021   HDL 60.90 11/12/2021   CHOLHDL 3 11/12/2021   VLDL 18.0 11/12/2021   LDLCALC 99 11/12/2021   LDLCALC 117 (H) 03/08/2021    Physical  Findings: AIMS:  , ,  ,  ,    CIWA:  CIWA-Ar Total: 4 COWS:     Musculoskeletal: Strength & Muscle Tone: within normal limits Gait & Station: normal Patient leans: N/A  Psychiatric Specialty Exam:  Presentation  General Appearance:  Appropriate for Environment; Casual (Multiple facial piercings)  Eye Contact: Fair  Speech: Clear and Coherent; Normal Rate  Speech Volume: Normal  Handedness: Right   Mood and Affect  Mood: Anxious; Hopeless; Worthless; Depressed  Affect: congruent  Thought Process  Thought Processes: Goal Directed  Descriptions of Associations:Intact  Orientation:Full (Time, Place and Person)  Thought Content: logical History of Schizophrenia/Schizoaffective disorder:No  Duration of Psychotic Symptoms:N/A  Hallucinations:Hallucinations: None  Ideas of Reference:None  Suicidal Thoughts: thoughts of death with no plan yesterday Homicidal Thoughts:Homicidal Thoughts: No   Sensorium  Memory: Immediate Fair; Remote Poor; Recent Poor  Judgment: Impaired  Insight: Lacking   Executive Functions  Concentration: Poor  Attention Span: Poor  Recall: Poor  Fund of Knowledge: Fair  Language: Fair   Psychomotor Activity  Psychomotor Activity: Psychomotor Activity: Restlessness   Assets  Assets: Housing; Social Support   Sleep  Sleep: Sleep: Poor    Physical Exam: Physical Exam Constitutional:  Appearance: the patient is not toxic-appearing.  Pulmonary:     Effort: Pulmonary effort is normal.  Neurological:     General: No focal deficit present.     Mental Status: the patient is alert and oriented to person, place, and time.   Review of Systems  Respiratory:  Negative for shortness of breath.   Cardiovascular:  Negative for chest pain.  Gastrointestinal:  Negative for abdominal pain, constipation, diarrhea, nausea and vomiting.  Neurological:  Negative for headaches.   Blood pressure 120/83, pulse  93, temperature 98 F (36.7 C), temperature source Oral, resp. rate 17, height '5\' 2"'$  (1.575 m), weight 94.8 kg, last menstrual period 10/13/2021, SpO2 100 %. Body mass index is 38.23 kg/m.   Treatment Plan Summary: Daily contact with patient to assess and evaluate symptoms and progress in treatment and Medication management  Assessment:   Diagnoses / Active Problems:   Safety and Monitoring: INVOLUTARILY ( ) admission to inpatient psychiatric unit for safety, stabilization and treatment Daily contact with patient to assess and evaluate symptoms and progress in treatment Patient's case to be discussed in multi-disciplinary team meeting Observation Level : 1:1 sitter due to patient endorsing active suicidality Vital signs: q12 hours Precautions: suicide, elopement, and assault   2. Psychiatric Diagnoses and Treatment # Bipolar affective disorder #GAD #PTSD -Begin cross taper from home Latuda 120 mg to Invega p.o.: Latuda 60 mg tonight, followed by 40 mg tomorrow night, followed by 20 mg Sunday night, then discontinue.  And Invega 3 mg x 2 doses, followed by 6 mg, which will continue. -Continue home Trileptal 300 mg twice daily - Continue home Minipress 1 mg daily and 3 mg nightly -- Add Trazodone 25 mg QHS per patient's request   PRN: Zofran, Milk of Magnesia, Imodium, Maalox, and Tylenol.  Agitation: Zyprexa 5 mg, Geodon 20 mg IM, and Ativan 1 mg p.o.   -- The risks/benefits/side-effects/alternatives to this medication were discussed in detail with the patient and time was given for questions. The patient consents to medication trial.              -- Metabolic profile and EKG monitoring obtained while on an atypical antipsychotic  BMI: 38.23 Lipid Panel: WNL on 10/11 TSH: 2.41 on 10/11 HbgA1c: 5.6% on 10/11 QTc: 433             -- Encouraged patient to participate in unit milieu and in scheduled group therapies      3. Medical Issues Being Addressed: #Alcohol use  disorder Patient with reported history of 2 to 320 ounce beers 3 to 4 days/week, with last drink just prior to admission.  Ethanol 132 on admission. -CIWA protocol for monitoring of withdrawal with IM thiamine x 1 and MVI replacement and Librium 25 mg every 6 hours for scores >10, and hydroxyzine 25 mg for CIWA scores <10.     #Cellulitis from cuts to thighs -Resume previously prescribed doxycycline 100 mg twice daily x 10 days - Resume previously prescribed Diflucan 150 mg every 3 days x 3 doses   #Acute asthmatic bronchitis - Resume home rescue inhaler and Dulera 2 puffs twice daily (equivalent of home Symbicort) - Resume previously prescribed prednisone 20 mg daily x 5 doses   4. Discharge Planning:              -- Social work and case management to assist with discharge planning and identification of hospital follow-up needs prior to discharge             --  Estimated LOS: 5-7 days             -- Discharge Concerns: Need to establish a safety plan; Medication compliance and effectiveness             -- Discharge Goals: Return home with outpatient referrals for mental health follow-up including medication management/psychotherapy    Corky Sox, MD 11/15/2021, 1:27 PM

## 2021-11-15 NOTE — Progress Notes (Signed)
   11/15/21 0524  Sleep  Number of Hours 6

## 2021-11-15 NOTE — Progress Notes (Signed)
Adult Psychoeducational Group Note  Date:  11/15/2021 Time:  2:49 PM  Group Topic/Focus:  Self Care:   The focus of this group is to help patients understand the importance of self-care in order to improve or restore emotional, physical, spiritual, interpersonal, and financial health.  Participation Level:  Active  Participation Quality:  Appropriate  Affect:  Appropriate  Cognitive:  Appropriate  Insight: Appropriate  Engagement in Group:  Engaged  Modes of Intervention:  Discussion  Additional Comments:  Patient attended evening therapeutic group and participated.  Kapil Petropoulos W Neysha Criado [id number]/88/6484, 2:49 PM

## 2021-11-15 NOTE — Progress Notes (Addendum)
Nursing 1:1 note D:Pt observed sleeping in bed with eyes closed. RR even and unlabored. No distress noted. A: 1:1 observation continues for safety  R: pt remains safe  

## 2021-11-15 NOTE — [facility] Group Notes (Signed)
Adult Psychoeducational Group Note  Date:  11/15/2021 Time:  9:19 PM  Group Topic/Focus:  Wrap-Up Group:   The focus of this group is to help patients review their daily goal of treatment and discuss progress on daily workbooks.  Participation Level:  Active  Participation Quality:  Appropriate and Redirectable  Affect:  Appropriate  Cognitive:  Alert  Insight: Good  Engagement in Group:  Engaged and Off Topic  Modes of Intervention:  Discussion and Support  Additional Comments:  Pt attended and engaged in wrap up group. Pt goal was to prepare for discharge. Pt rated her day a 4/10.   [name] 11/15/2021, 9:19 PM

## 2021-11-15 NOTE — Progress Notes (Signed)
   11/15/21 2219  Psych Admission Type (Psych Patients Only)  Admission Status Involuntary  Psychosocial Assessment  Patient Complaints Anxiety  Eye Contact Fair  Facial Expression Anxious  Affect Appropriate to circumstance  Speech Logical/coherent  Interaction Assertive  Motor Activity Fidgety  Appearance/Hygiene Unremarkable  Behavior Characteristics Intrusive;Anxious  Mood Labile;Pleasant  Thought Process  Coherency WDL  Content WDL  Delusions None reported or observed  Perception WDL  Hallucination None reported or observed  Judgment Poor  Confusion None  Danger to Self  Current suicidal ideation? Denies  Self-Injurious Behavior No self-injurious ideation or behavior indicators observed or expressed   Agreement Not to Harm Self Yes  Description of Agreement verbal  Danger to Others  Danger to Others None reported or observed
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
## 2021-11-15 NOTE — Group Note (Signed)
[name] Group Therapy Note  11/15/2021      Topic:  Anger Healthy and Unhealthy Coping Skills  Participation Level:  Active  Description of Group:   In this group, patients identified their own common triggers and typical reactions then analyzed how these reactions are possibly beneficial and possibly unhelpful.  Focus was placed on examining whether typical coping skills are healthy or unhealthy.  Therapeutic Goals: Patients will share situations that commonly incite their anger and how they typically respond Patients will identify how their coping skills work for them and/or against them Patients will explore possible alternative coping skills Patients will learn that anger itself is normal and that healthier reactions can assist with resolving conflict rather than worsening situations  Summary of Patient Progress:  The patient shared that her frequent cause of anger is when bad things happen to her or people hurt her.  Choice of coping skill is often to blame herself because she feels like she deserves it, which was identified as an unhealthy choice because it is just more hurtful and does not help her to make progress in recovery.  The patient left the group and did not return, was found in the hallway immediately following group, was tearful and surrounded by other patients consoling her.  She stated that she was told she needed to take a shower, was scared about this because she has not showered in 5 weeks.  This was conveyed to her nurse who continued to work with her throughout the afternoon.  Therapeutic Modalities:   Cognitive Behavioral Therapy Processing  Maretta Los, [name]

## 2021-11-15 NOTE — Progress Notes (Signed)
Nursing 1:1 note D:Pt observed sleeping in bed with eyes closed. RR even and unlabored. No distress noted. A: 1:1 observation continues for safety  R: pt remains safe  

## 2021-11-15 NOTE — Progress Notes (Signed)
1:1 Note:  D/C 1:1 status per Dr. Alvie Heidelberg.

## 2021-11-16 MED ORDER — PANTOPRAZOLE SODIUM 20 MG PO TBEC
20.0000 mg | DELAYED_RELEASE_TABLET | Freq: Two times a day (BID) | ORAL | Status: DC
  Administered 2021-11-16 – 2021-11-17 (×2): 20 mg via ORAL
  Filled 2021-11-16 (×6): qty 1

## 2021-11-16 MED ORDER — TRAZODONE HCL 50 MG PO TABS
50.0000 mg | ORAL_TABLET | Freq: Every day | ORAL | Status: DC
  Administered 2021-11-16: 50 mg via ORAL
  Filled 2021-11-16 (×4): qty 1

## 2021-11-16 MED ORDER — MONTELUKAST SODIUM 10 MG PO TABS
10.0000 mg | ORAL_TABLET | Freq: Every day | ORAL | Status: DC
  Administered 2021-11-16: 10 mg via ORAL
  Filled 2021-11-16 (×3): qty 1

## 2021-11-16 NOTE — Progress Notes (Signed)
   11/16/21 1800  Psych Admission Type (Psych Patients Only)  Admission Status Involuntary  Psychosocial Assessment  Patient Complaints Anxiety;Depression;Insomnia  Eye Contact Fair  Facial Expression Anxious  Affect Anxious  Speech Logical/coherent  Interaction Assertive;Needy  Motor Activity Fidgety  Appearance/Hygiene Unremarkable  Behavior Characteristics Anxious  Mood Anxious;Labile  Thought Process  Coherency WDL  Content WDL  Delusions None reported or observed  Perception WDL  Hallucination None reported or observed  Judgment Poor  Confusion None  Danger to Self  Current suicidal ideation? Denies  Self-Injurious Behavior No self-injurious ideation or behavior indicators observed or expressed   Agreement Not to Harm Self Yes  Description of Agreement verbal  Danger to Others  Danger to Others None reported or observed

## 2021-11-16 NOTE — Progress Notes (Signed)
[facility] MD Progress Note  11/16/2021 12:45 PM [name]  MRN:  [id number] Subjective:   [name] is a 36 year old female with a past psychiatric history of bipolar affective disorder and borderline personality disorder.  She presented to care after reported overdose on 25 tablets of 600 mg gabapentin.  Her primary stressor related to this event involved her disability benefits being cut in half.  She is admitted on a involuntary basis.  Of note, the patient required Zyprexa 5 mg as needed at 11 AM yesterday due to agitation.  On interview and assessment this morning the patient is more emotionally stable and assessment yesterday.  She shares poetry that she read yesterday.  She reports poor sleep which is unusual for her even inside the psychiatric hospital.  She reports having a good dinner last night and eating well.  She feels she is doing better.  She reports experiencing agitation earlier this morning but feels she dealt with it in an appropriate manner, asking for medication and then later going to group.  The patient states that her Zyprexa has been helpful on an as-needed basis and asks if she can take this medication at home.  The patient denies experiencing suicidal thoughts over the past 24 hours.  She requests the resumption of her home Protonix and Singulair.  Discussed increasing her dose of trazodone to 50 mg nightly.  Patient denies experiencing auditory or visual hallucinations or homicidal thoughts.  Principal Problem: Bipolar affective disorder ([place]) Diagnosis: Principal Problem:   Bipolar affective disorder ([place]) Active Problems:   Borderline personality disorder in adult [facility])   PTSD (post-traumatic stress disorder)  Total Time spent with patient: 20 minutes  Past Psychiatric History: as above  Past Medical History:  Past Medical History:  Diagnosis Date   Allergy    Anxiety    Asthma    Bipolar 2 disorder ([place])    Borderline personality disorder ([place])     Depression    Eczema    Former smoker    Obesity    PTSD (post-traumatic stress disorder)    Urticaria     Past Surgical History:  Procedure Laterality Date   Thumb surgery Left    Family History:  Family History  Problem Relation Age of Onset   Healthy Mother    Healthy Father    Alzheimer's disease Maternal Grandmother    Skin cancer Paternal Grandmother    Allergic rhinitis Paternal Grandmother    Asthma Paternal Grandmother    Angioedema Neg Hx    Eczema Neg Hx    Urticaria Neg Hx    Family Psychiatric  History: per H and P Social History:  Social History   Substance and Sexual Activity  Alcohol Use Yes   Alcohol/week: 8.0 standard drinks of alcohol   Types: 8 Cans of beer per week   Comment: occ     Social History   Substance and Sexual Activity  Drug Use Yes   Frequency: 4.0 times per week   Types: Marijuana   Comment: Last smoked this morning-03/09/19    Social History   Socioeconomic History   Marital status: Single    Spouse name: Not on file   Number of children: 0   Years of education: 13   Highest education level: Not on file  Occupational History   Not on file  Tobacco Use   Smoking status: Some Days    Packs/day: 0.50    Years: 9.00    Total pack years: 4.50  Types: Cigarettes    Last attempt to quit: 12/04/2015    Years since quitting: 5.9    Passive exposure: Current   Smokeless tobacco: Never  Vaping Use   Vaping Use: Former  Substance and Sexual Activity   Alcohol use: Yes    Alcohol/week: 8.0 standard drinks of alcohol    Types: 8 Cans of beer per week    Comment: occ   Drug use: Yes    Frequency: 4.0 times per week    Types: Marijuana    Comment: Last smoked this morning-03/09/19   Sexual activity: Yes    Birth control/protection: Pill  Other Topics Concern   Not on file  Social History Narrative   Fun: Play video games and watch movies.   Denies abuse and feels safe at home.   Social Determinants of Health    Financial Resource Strain: Not on file  Food Insecurity: Food Insecurity Present (11/13/2021)   Hunger Vital Sign    Worried About Running Out of Food in the Last Year: Sometimes true    Ran Out of Food in the Last Year: Sometimes true  Transportation Needs: Unmet Transportation Needs (11/13/2021)   PRAPARE - Hydrologist (Medical): Yes    Lack of Transportation (Non-Medical): Yes  Physical Activity: Not on file  Stress: Not on file  Social Connections: Not on file   Additional Social History:                         Sleep: Fair  Appetite:  Fair  Current Medications: Current Facility-Administered Medications  Medication Dose Route Frequency Provider Last Rate Last Admin   acetaminophen (TYLENOL) tablet 650 mg  650 mg Oral Q6H PRN Lindon Romp A, NP   650 mg at 11/14/21 2010   albuterol (VENTOLIN HFA) 108 (90 Base) MCG/ACT inhaler 2 puff  2 puff Inhalation Q6H PRN Rosezetta Schlatter, MD       alum & mag hydroxide-simeth (MAALOX/MYLANTA) 200-200-20 MG/5ML suspension 30 mL  30 mL Oral Q4H PRN Lindon Romp A, NP   30 mL at 11/16/21 0835   chlordiazePOXIDE (LIBRIUM) capsule 25 mg  25 mg Oral Q6H PRN Rosezetta Schlatter, MD   25 mg at 11/16/21 0630   doxycycline (VIBRA-TABS) tablet 100 mg  100 mg Oral Q12H Rosezetta Schlatter, MD   100 mg at 11/16/21 0834   feeding supplement (ENSURE ENLIVE / ENSURE PLUS) liquid 237 mL  237 mL Oral BID BM Paliy, Alisa, MD   237 mL at 11/16/21 1149   fluconazole (DIFLUCAN) tablet 150 mg  150 mg Oral Q3 days Rosezetta Schlatter, MD   150 mg at 11/14/21 2006   hydrOXYzine (ATARAX) tablet 25 mg  25 mg Oral Q6H PRN Rosezetta Schlatter, MD   25 mg at 11/16/21 0631   hydrOXYzine (ATARAX) tablet 50 mg  50 mg Oral QHS PRN Lindon Romp A, NP   50 mg at 11/13/21 2029   loperamide (IMODIUM) capsule 2-4 mg  2-4 mg Oral PRN Rosezetta Schlatter, MD       OLANZapine zydis (ZYPREXA) disintegrating tablet 5 mg  5 mg Oral Q8H PRN Massengill, Ovid Curd, MD   5  mg at 11/16/21 1020   And   LORazepam (ATIVAN) tablet 1 mg  1 mg Oral Q6H PRN Massengill, Ovid Curd, MD       And   ziprasidone (GEODON) injection 20 mg  20 mg Intramuscular Q6H PRN Janine Limbo, MD  lurasidone (LATUDA) tablet 20 mg  20 mg Oral QHS Rosezetta Schlatter, MD       magnesium hydroxide (MILK OF MAGNESIA) suspension 30 mL  30 mL Oral Daily PRN Lindon Romp A, NP       mometasone-formoterol (DULERA) 200-5 MCG/ACT inhaler 2 puff  2 puff Inhalation BID Rosezetta Schlatter, MD   2 puff at 11/16/21 0833   montelukast (SINGULAIR) tablet 10 mg  10 mg Oral QHS Corky Sox, MD       multivitamin with minerals tablet 1 tablet  1 tablet Oral Daily Rosezetta Schlatter, MD   1 tablet at 11/16/21 7782   neomycin-bacitracin-polymyxin (NEOSPORIN) ointment   Topical BID Corky Sox, MD       nicotine (NICODERM CQ - dosed in mg/24 hours) patch 21 mg  21 mg Transdermal Daily Rosezetta Schlatter, MD   21 mg at 11/16/21 0830   ondansetron (ZOFRAN-ODT) disintegrating tablet 4 mg  4 mg Oral Q6H PRN Rosezetta Schlatter, MD       Oxcarbazepine (TRILEPTAL) tablet 300 mg  300 mg Oral BID Lindon Romp A, NP   300 mg at 11/16/21 0834   paliperidone (INVEGA) 24 hr tablet 6 mg  6 mg Oral QHS Rosezetta Schlatter, MD       pantoprazole (PROTONIX) EC tablet 20 mg  20 mg Oral BID Corky Sox, MD       prazosin (MINIPRESS) capsule 1 mg  1 mg Oral Daily Rosezetta Schlatter, MD   1 mg at 11/16/21 0834   prazosin (MINIPRESS) capsule 3 mg  3 mg Oral QHS Lindon Romp A, NP   3 mg at 11/15/21 2203   predniSONE (DELTASONE) tablet 20 mg  20 mg Oral Q breakfast Rosezetta Schlatter, MD   20 mg at 11/16/21 0834   traZODone (DESYREL) tablet 25 mg  25 mg Oral QHS Corky Sox, MD   25 mg at 11/15/21 2309    Lab Results: No results found for this or any previous visit (from the past 38 hour(s)).  Blood Alcohol level:  Lab Results  Component Value Date   ETH 132 (H) 11/13/2021   ETH <10 42/35/3614    Metabolic Disorder Labs: Lab  Results  Component Value Date   HGBA1C 5.6 11/12/2021   MPG 111 03/05/2021   MPG 102.54 12/20/2020   No results found for: "PROLACTIN" Lab Results  Component Value Date   CHOL 177 11/12/2021   TRIG 90.0 11/12/2021   HDL 60.90 11/12/2021   CHOLHDL 3 11/12/2021   VLDL 18.0 11/12/2021   LDLCALC 99 11/12/2021   LDLCALC 117 (H) 03/08/2021    Physical Findings:   Musculoskeletal: Strength & Muscle Tone: within normal limits Gait & Station: normal Patient leans: N/A  Psychiatric Specialty Exam:  Presentation  General Appearance:  Appropriate for Environment; Casual (Multiple facial piercings)  Eye Contact: Fair  Speech: Clear and Coherent; Normal Rate  Speech Volume: Normal  Handedness: Right   Mood and Affect  Mood: "Better", happy  Affect: congruent  Thought Process  Thought Processes: Goal Directed  Descriptions of Associations:Intact  Orientation:Full (Time, Place and Person)  Thought Content: logical History of Schizophrenia/Schizoaffective disorder:No  Duration of Psychotic Symptoms:N/A  Hallucinations: none  Ideas of Reference:None  Suicidal Thoughts: denies Homicidal Thoughts: denies   Sensorium  Memory: Immediate Fair; Remote Poor; Recent Poor  Judgment: Impaired  Insight: Lacking   Executive Functions  Concentration: Poor  Attention Span: Poor  Recall: Poor  Fund of Knowledge: Fair  Language: Fair   Psychomotor  Activity  Psychomotor Activity: normal   Assets  Assets: Housing; Social Support   Sleep  Sleep: poor    Physical Exam: Physical Exam Constitutional:      Appearance: the patient is not toxic-appearing.  Pulmonary:     Effort: Pulmonary effort is normal.  Neurological:     General: No focal deficit present.     Mental Status: the patient is alert and oriented to person, place, and time.   Review of Systems  Respiratory:  Negative for shortness of breath.   Cardiovascular:   Negative for chest pain.  Gastrointestinal:  Negative for abdominal pain, constipation, diarrhea, nausea and vomiting.  Neurological:  Negative for headaches.   Blood pressure (!) 122/94, pulse (!) 141, temperature (!) 97.5 F (36.4 C), temperature source Oral, resp. rate 17, height '5\' 2"'$  (1.575 m), weight 94.8 kg, last menstrual period 10/13/2021, SpO2 92 %. Body mass index is 38.23 kg/m.   Treatment Plan Summary: Daily contact with patient to assess and evaluate symptoms and progress in treatment and Medication management  Assessment:   Diagnoses / Active Problems:   Safety and Monitoring: INVOLUTARILY ( ) admission to inpatient psychiatric unit for safety, stabilization and treatment Daily contact with patient to assess and evaluate symptoms and progress in treatment Patient's case to be discussed in multi-disciplinary team meeting Observation Level : 1:1 sitter due to patient endorsing active suicidality Vital signs: q12 hours Precautions: suicide, elopement, and assault   2. Psychiatric Diagnoses and Treatment # Bipolar affective disorder #GAD #PTSD -Begin cross taper from home Latuda 120 mg to Invega p.o.: Latuda 60 mg tonight, followed by 40 mg tomorrow night, followed by 20 mg Sunday night, then discontinue.  And Invega 3 mg x 2 doses, followed by 6 mg, which will continue. -Continue home Trileptal 300 mg twice daily - Continue home Minipress 1 mg daily and 3 mg nightly -- Increase trazodone to 50 mg nightly for insomnia   PRN: Zofran, Milk of Magnesia, Imodium, Maalox, and Tylenol.  Agitation: Zyprexa 5 mg, Geodon 20 mg IM, and Ativan 1 mg p.o.   -- The risks/benefits/side-effects/alternatives to this medication were discussed in detail with the patient and time was given for questions. The patient consents to medication trial.              -- Metabolic profile and EKG monitoring obtained while on an atypical antipsychotic  BMI: 38.23 Lipid Panel: WNL on 10/11 TSH:  2.41 on 10/11 HbgA1c: 5.6% on 10/11 QTc: 433             -- Encouraged patient to participate in unit milieu and in scheduled group therapies      3. Medical Issues Being Addressed: #Alcohol use disorder Patient with reported history of 2 to 320 ounce beers 3 to 4 days/week, with last drink just prior to admission.  Ethanol 132 on admission. -CIWA protocol for monitoring of withdrawal with IM thiamine x 1 and MVI replacement and Librium 25 mg every 6 hours for scores >10, and hydroxyzine 25 mg for CIWA scores <10.     #Cellulitis from cuts to thighs -Resume previously prescribed doxycycline 100 mg twice daily x 10 days - Resume previously prescribed Diflucan 150 mg every 3 days x 3 doses   #Acute asthmatic bronchitis - Resume home rescue inhaler and Dulera 2 puffs twice daily (equivalent of home Symbicort) - Resume previously prescribed prednisone 20 mg daily x 5 doses   4. Discharge Planning:              --  Social work and case management to assist with discharge planning and identification of hospital follow-up needs prior to discharge             -- Estimated LOS: 5-7 days             -- Discharge Concerns: Need to establish a safety plan; Medication compliance and effectiveness             -- Discharge Goals: Return home with outpatient referrals for mental health follow-up including medication management/psychotherapy    Corky Sox, MD 11/16/2021, 12:45 PM

## 2021-11-16 NOTE — B[facility] Group Notes (Signed)
[place] Group Notes:  (Nursing/MHT/Case Management/Adjunct)  Date:  11/16/2021  Time:  9:44 AM  Type of Therapy:   Discussion  Participation Level:  Active  Participation Quality:  Appropriate  Affect:  Appropriate  Cognitive:  Appropriate  Insight:  Appropriate  Engagement in Group:  Engaged and Supportive  Modes of Intervention:  Discussion, Orientation, and Socialization  Summary of Progress/Problems:  [name] 11/16/2021, 9:44 AM

## 2021-11-16 NOTE — Group Note (Signed)
[name] Group Therapy Note  11/16/2021      Type of Therapy and Topic:  Group Therapy: Gratitude  Participation Level:  Active   Description of Group:   In this group, patients shared and discussed the importance of acknowledging the elements in their lives for which they are grateful and how this can positively impact their mood.  The group discussed how bringing the positive elements of their lives to the forefront of their minds can help with recovery from any illness, physical or mental.  An exercise was done as a group in which a list was made of gratitude items in order to encourage participants to consider other potential positives in their lives.  Therapeutic Goals: Patients will identify one or more item for which they are grateful in each of 6 categories:  people, experience, thing, place, skill, and other. Patients will discuss how it is possible to seek out gratitude in even bad situations. Patients will explore other possible items of gratitude that they could remember.   Summary of Patient Progress:  The patient shared that her immediate feelings of gratitude were for having "another chance" today.  She talked about how yesterday and today both have been really bad days, but she expressed gratitude for the various patients who have spent time with her.  She received good feedback from [place] and the group about how she reminded [place] all that "another chance today" is a very valuable thing to cherish.  She spoke up many times with insightful statements.   Therapeutic Modalities:   Solution-Focused Therapy Activity  [place] Hun Grossman-Orr, [name] .

## 2021-11-16 NOTE — [facility] Group Notes (Signed)
PsychoEducational Group- Patients were given education on recognizing signs of mental health decompensation. Patients were asked to reflect and recognize signs/symptoms they noticed prior to coming to the hospital, and healthy coping mechanisms to utilize to help mental well being.  Patient participated and was appropriate.

## 2021-11-16 NOTE — Progress Notes (Signed)
Adult Psychoeducational Group Note  Date:  11/16/2021 Time:  9:41 PM  Group Topic/Focus:  Wrap-Up Group:   The focus of this group is to help patients review their daily goal of treatment and discuss progress on daily workbooks.  Participation Level:  Active  Participation Quality:  Appropriate  Affect:  Appropriate  Cognitive:  Appropriate  Insight: Appropriate  Engagement in Group:  Engaged  Modes of Intervention:  Education and Exploration  Additional Comments:  Patient attended and participated in group tonight. She reports taht she learn how to tell the warning signs whenever she starts to decompensate. She like how deeply she is able to love and she do not give up on herself or others.  Salley Scarlet [facility] 11/16/2021, 9:41 PM

## 2021-11-16 NOTE — Progress Notes (Signed)
   11/16/21 2231  Psych Admission Type (Psych Patients Only)  Admission Status Involuntary  Psychosocial Assessment  Patient Complaints Anxiety  Eye Contact Fair  Facial Expression Animated  Affect Appropriate to circumstance  Speech Logical/coherent  Interaction Assertive;Needy  Motor Activity Fidgety  Appearance/Hygiene Unremarkable  Behavior Characteristics Appropriate to situation  Mood Anxious;Pleasant  Thought Process  Coherency WDL  Content WDL  Delusions None reported or observed  Perception WDL  Hallucination None reported or observed  Judgment Poor  Confusion None  Danger to Self  Current suicidal ideation? Denies  Self-Injurious Behavior No self-injurious ideation or behavior indicators observed or expressed   Agreement Not to Harm Self Yes  Description of Agreement verbal  Danger to Others  Danger to Others None reported or observed

## 2021-11-17 DIAGNOSIS — F319 Bipolar disorder, unspecified: Secondary | ICD-10-CM

## 2021-11-17 MED ORDER — FLUCONAZOLE 150 MG PO TABS: 150.0000 mg | ORAL_TABLET | ORAL | 0 refills | Status: DC

## 2021-11-17 MED ORDER — DOXYCYCLINE HYCLATE 100 MG PO TABS: 100.0000 mg | ORAL_TABLET | Freq: Two times a day (BID) | ORAL | 0 refills | Status: AC

## 2021-11-17 MED ORDER — TRAZODONE HCL 50 MG PO TABS: 50.0000 mg | ORAL_TABLET | Freq: Every day | ORAL | 0 refills | Status: DC

## 2021-11-17 MED ORDER — PREDNISONE 20 MG PO TABS: 20.0000 mg | ORAL_TABLET | Freq: Every day | ORAL | 0 refills | Status: AC

## 2021-11-17 MED ORDER — NICOTINE 21 MG/24HR TD PT24: 21.0000 mg | MEDICATED_PATCH | Freq: Every day | TRANSDERMAL | 0 refills | Status: AC

## 2021-11-17 MED ORDER — PALIPERIDONE ER 6 MG PO TB24: 6.0000 mg | ORAL_TABLET | Freq: Every day | ORAL | 0 refills | Status: DC

## 2021-11-17 MED ORDER — OLANZAPINE 5 MG PO TBDP: 5.0000 mg | ORAL_TABLET | Freq: Four times a day (QID) | ORAL | 0 refills | Status: DC | PRN

## 2021-11-17 MED ORDER — BACITRACIN-NEOMYCIN-POLYMYXIN OINTMENT TUBE: 1.0000 | TOPICAL_OINTMENT | Freq: Two times a day (BID) | CUTANEOUS | Status: DC

## 2021-11-17 NOTE — [facility] Suicide Risk Assessment (Signed)
[place] INPATIENT:  Family/Significant Other Suicide Prevention Education  Suicide Prevention Education:  Contact Attempt on 11-17-2021 [name] [phone] has been identified by the patient as the family member/significant other with whom the patient will be residing, and identified as the person(s) who will aid the patient in the event of a mental health crisis.  With written consent from the patient, two attempts were made to provide suicide prevention education, prior to and/or following the patient's discharge.  We were unsuccessful in providing suicide prevention education.  A suicide education pamphlet was given to the patient to share with family/significant other.  Date and time of  attempt:11/17/2021 10:54 AM    [name] MSW, [name] 11/17/2021, 10:52 AM

## 2021-11-17 NOTE — Progress Notes (Signed)
D/c Instructions-Education: [name] Patient D/C home per MD order. D/c education completed with patient including follow up instructions, medication list, d/c activities limitations if indicated, with other d/c instructions as indicated by MD - patient able to verbalize understanding, all questions fully answered. Patient denied SI/HI/AVH. Suicide prevention information given and discussed with patient and understanding of D/C information verbalized by patient. All questions asked were answered by staff, patient stated they have no questions at time of D/C. Patient received all belongings brought to the unit at discharge. Patient showed appreciation of care given to her at the [facility] unit. Patient was escorted to the lobby and left for home in private auto at 1215.    Dorris Carnes, RN

## 2021-11-17 NOTE — Discharge Summary (Cosign Needed Addendum)
Physician Discharge Summary Note  Patient:  [name] is an 36 y.o., female MRN:  [id number] DOB:  1985/12/12 Patient phone:  [phone] (home)  Patient address:   [address] [place] [address],  Total Time spent with patient: 30 minutes  Date of Admission:  11/13/2021 Date of Discharge: 11/17/2021  Reason for Admission:  Per H&P "[name] is a 36 year old female with a past psychiatric history of borderline personality disorder, bipolar II, PTSD, and multiple past psychiatric admissions for suicidality who presented involuntarily from [facility] ED after a suicide attempt with an intentional overdose of 25 600 mg gabapentin capsules and 3 beers.   On Chart Review: Patient with at least 8 prior inpatient psychiatric admissions, dating back to 2004.  Also with multiple suicide attempts.  This presentation is very similar to her presentation in February 2023 in which she reported that she was lonely and wanted to kill herself.  She is recently engaged in self-harm behaviors via cutting, per PCP visit on 10/11 in which she was prescribed doxycycline for cellulitis."  Principal Problem: Bipolar affective disorder [facility]) Discharge Diagnoses: Principal Problem:   Bipolar affective disorder ([place]) Active Problems:   Borderline personality disorder in adult [facility])   PTSD (post-traumatic stress disorder)    Past Psychiatric History:  Previous Psych Diagnoses: Depression, anxiety, borderline personality disorder, mood disorder, bipolar 2 disorder, OCD, PTSD, ADHD, panic disorder Prior inpatient treatment: Multiple inpatient admissions at [facility], [facility] March 2022, [facility]; at least 60 prior hospitalizations Prior outpatient treatment: [facility]; Per records was previously working with Dr. Darleene Cleaver.  Psychotherapy hx: DBT History of suicide: Multiple suicide attempts and suicidal ideation including plans to jump from a car, and overdosing on medications  as well as illicit drugs History of homicide: Denies Psychiatric medication history: Effexor, Xanax, Valium, Ativan, Risperdal, Ambien, trazodone, Wellbutrin, Prozac, Elavil, buspirone, Librium, Celexa, Seroquel, Invega, Latuda.  Has not tried Abilify, Rexulti or Vraylar. Mood stabilizers including Neurontin, Topamax, Depakote and Lamictal. Has not tried Tegretol and Trileptal. Psychiatric medication compliance history: Inconsistent Neuromodulation history: Denies Current Psychiatrist: Dr. Merian Capron, MD at behavioral health outpatient [facility] Current therapist: Rayetta Humphrey  Past Medical History:  Past Medical History:  Diagnosis Date   Allergy    Anxiety    Asthma    Bipolar 2 disorder ([place])    Borderline personality disorder ([place])    Depression    Eczema    Former smoker    Obesity    PTSD (post-traumatic stress disorder)    Urticaria     Past Surgical History:  Procedure Laterality Date   Thumb surgery Left    Family History:  Family History  Problem Relation Age of Onset   Healthy Mother    Healthy Father    Alzheimer's disease Maternal Grandmother    Skin cancer Paternal Grandmother    Allergic rhinitis Paternal Grandmother    Asthma Paternal Grandmother    Angioedema Neg Hx    Eczema Neg Hx    Urticaria Neg Hx    Family Psychiatric History: Psych: mom is "not stable" but is unsure of diagnosis, completed suicide in paternal uncle; alcohol abuse on both sides of family   Social History:  Social History   Substance and Sexual Activity  Alcohol Use Yes   Alcohol/week: 8.0 standard drinks of alcohol   Types: 8 Cans of beer per week   Comment: occ     Social History   Substance and Sexual Activity  Drug  Use Yes   Frequency: 4.0 times per week   Types: Marijuana   Comment: Last smoked this morning-03/09/19    Social History   Socioeconomic History   Marital status: Single    Spouse name: Not on file   Number of children: 0   Years of  education: 13   Highest education level: Not on file  Occupational History   Not on file  Tobacco Use   Smoking status: Some Days    Packs/day: 0.50    Years: 9.00    Total pack years: 4.50    Types: Cigarettes    Last attempt to quit: 12/04/2015    Years since quitting: 5.9    Passive exposure: Current   Smokeless tobacco: Never  Vaping Use   Vaping Use: Former  Substance and Sexual Activity   Alcohol use: Yes    Alcohol/week: 8.0 standard drinks of alcohol    Types: 8 Cans of beer per week    Comment: occ   Drug use: Yes    Frequency: 4.0 times per week    Types: Marijuana    Comment: Last smoked this morning-03/09/19   Sexual activity: Yes    Birth control/protection: Pill  Other Topics Concern   Not on file  Social History Narrative   Fun: Play video games and watch movies.   Denies abuse and feels safe at home.   Social Determinants of Health   Financial Resource Strain: Not on file  Food Insecurity: Food Insecurity Present (11/13/2021)   Hunger Vital Sign    Worried About Running Out of Food in the Last Year: Sometimes true    Ran Out of Food in the Last Year: Sometimes true  Transportation Needs: Unmet Transportation Needs (11/13/2021)   PRAPARE - Hydrologist (Medical): Yes    Lack of Transportation (Non-Medical): Yes  Physical Activity: Not on file  Stress: Not on file  Social Connections: Not on file    Hospital Course:    During the patient's hospitalization, patient had extensive initial psychiatric evaluation, and follow-up psychiatric evaluations every day.  Psychiatric diagnoses provided upon initial assessment:  Principal Problem:   Bipolar affective disorder ([place]) Active Problems:   Borderline personality disorder in adult [facility])   PTSD (post-traumatic stress disorder)  Patient's psychiatric medications were adjusted on admission:  --Begin cross taper from home Latuda 120 mg to Invega p.o.: Latuda 60 mg tonight,  followed by 40 mg tomorrow night, followed by 20 mg Sunday night, then discontinue.  And Invega 3 mg x 2 doses, followed by 6 mg, which will continue. -Continue home Trileptal 300 mg twice daily - Continue home Minipress 1 mg daily and 3 mg nightly  During the hospitalization, other adjustments were made to the patient's psychiatric medication regimen:  -- Latuda discontinued and Invega titrated to 6 mg qHS. -- Increase trazodone to 50 mg nightly for insomnia  -- Started Zyprexa Zydis 5 mg PRN for severe anxiety.  Patient's care was discussed during the interdisciplinary team meeting every day during the hospitalization.  The patient denied having side effects to prescribed psychiatric medication.  Gradually, patient started adjusting to milieu. The patient was evaluated each day by a clinical provider to ascertain response to treatment. Improvement was noted by the patient's report of decreasing symptoms, improved sleep and appetite, affect, medication tolerance, behavior, and participation in unit programming.  Patient was asked each day to complete a self inventory noting mood, mental status, pain, new symptoms, anxiety  and concerns.   Symptoms were reported as significantly decreased or resolved completely by discharge.  The patient reports that their mood is stable.  The patient denied having suicidal thoughts for more than 48 hours prior to discharge.  Patient denies having homicidal thoughts.  Patient denies having auditory hallucinations.  Patient denies any visual hallucinations or other symptoms of psychosis.  The patient was motivated to continue taking medication with a goal of continued improvement in mental health.   The patient reports their target psychiatric symptoms of depression and suicidality responded well to the psychiatric medications, and the patient reports overall benefit other psychiatric hospitalization. Supportive psychotherapy was provided to the patient. The patient  also participated in regular group therapy while hospitalized. Coping skills, problem solving as well as relaxation therapies were also part of the unit programming.  Labs were reviewed with the patient, and abnormal results were discussed with the patient.  The patient is able to verbalize their individual safety plan to this provider.  # It is recommended to the patient to continue psychiatric medications as prescribed, after discharge from the hospital.    # It is recommended to the patient to follow up with your outpatient psychiatric provider and PCP.  # It was discussed with the patient, the impact of alcohol, drugs, tobacco have been there overall psychiatric and medical wellbeing, and total abstinence from substance use was recommended the patient.ed.  # Prescriptions provided or sent directly to preferred pharmacy at discharge. Patient agreeable to plan. Given opportunity to ask questions. Appears to feel comfortable with discharge.    # In the event of worsening symptoms, the patient is instructed to call the crisis hotline, 911 and or go to the nearest ED for appropriate evaluation and treatment of symptoms. To follow-up with primary care provider for other medical issues, concerns and or health care needs  # Patient was discharged home with a plan to follow up as noted below.  Physical Findings: CIWA:  CIWA-Ar Total: 3  Musculoskeletal: Strength & Muscle Tone: within normal limits Gait & Station: normal Patient leans: N/A   Psychiatric Specialty Exam:  Presentation  General Appearance:  Appropriate for Environment; Casual; Well Groomed (multiple facial piercings)   Eye Contact: Good   Speech: Clear and Coherent; Normal Rate   Speech Volume: Normal   Handedness: Right    Mood and Affect  Mood: Euthymic   Affect: Appropriate; Congruent    Thought Process  Thought Processes: Coherent; Linear   Descriptions of  Associations:Intact   Orientation:Full (Time, Place and Person)   Thought Content:WDL; Logical   History of Schizophrenia/Schizoaffective disorder:No   Duration of Psychotic Symptoms:N/A  Hallucinations:Hallucinations: None   Ideas of Reference:None   Suicidal Thoughts:Suicidal Thoughts: No   Homicidal Thoughts:Homicidal Thoughts: No    Sensorium  Memory: Immediate Good; Recent Fair   Judgment: Fair   Insight: Fair; Shallow    Executive Functions  Concentration: Good   Attention Span: Good   Recall: Good   Fund of Knowledge: Good   Language: Good    Psychomotor Activity  Psychomotor Activity:Psychomotor Activity: Normal    Assets  Assets: Communication Skills; Housing    Sleep  Sleep:Sleep: Good     Physical Exam: Vital Signs Reviewed Constitutional:      Appearance: the patient is not toxic-appearing.  Pulmonary:     Effort: Pulmonary effort is normal.  Neurological:     General: No focal deficit present.     Mental Status: the patient is alert and  oriented to person, place, and time.    Review of Systems  Respiratory:  Negative for shortness of breath.   Cardiovascular:  Negative for chest pain.  Gastrointestinal:  Negative for abdominal pain, constipation, diarrhea, nausea and vomiting.  Neurological:  Negative for headaches.   Blood pressure (!) 126/92, pulse 82, temperature 97.7 F (36.5 C), temperature source Oral, resp. rate 17, height '5\' 2"'$  (1.575 m), weight 94.8 kg, last menstrual period 10/13/2021, SpO2 100 %. Body mass index is 38.23 kg/m.   Social History   Tobacco Use  Smoking Status Some Days   Packs/day: 0.50   Years: 9.00   Total pack years: 4.50   Types: Cigarettes   Last attempt to quit: 12/04/2015   Years since quitting: 5.9   Passive exposure: Current  Smokeless Tobacco Never   Tobacco Cessation:  A prescription for an FDA-approved tobacco cessation medication provided at  discharge   Blood Alcohol level:  Lab Results  Component Value Date   ETH 132 (H) 11/13/2021   ETH <10 72/10/4707    Metabolic Disorder Labs:  Lab Results  Component Value Date   HGBA1C 5.6 11/12/2021   MPG 111 03/05/2021   MPG 102.54 12/20/2020   No results found for: "PROLACTIN" Lab Results  Component Value Date   CHOL 177 11/12/2021   TRIG 90.0 11/12/2021   HDL 60.90 11/12/2021   CHOLHDL 3 11/12/2021   VLDL 18.0 11/12/2021   LDLCALC 99 11/12/2021   LDLCALC 117 (H) 03/08/2021    See Psychiatric Specialty Exam and Suicide Risk Assessment completed by Attending Physician prior to discharge.  Discharge destination:  Home  Is patient on multiple antipsychotic therapies at discharge:  No   Has Patient had three or more failed trials of antipsychotic monotherapy by history:  Yes,   Antipsychotic medications that previously failed include:   1.  Latuda., 2.  Risperdal., and 3.  Seroquel.  Recommended Plan for Multiple Antipsychotic Therapies: Additional reason(s) for multiple antispychotic treatment:  Patient with Zyprexa Zydis 5 mg PRN for severe anxiety. We believe that addition of this PRN medication will increase time to next hospitalization.  Discharge Instructions     Diet - low sodium heart healthy   Complete by: As directed    Increase activity slowly   Complete by: As directed       Allergies as of 11/17/2021       Reactions   Benztropine Other (See Comments)   agitation   Other Other (See Comments)   Opiates- history of dependency    Lamictal [lamotrigine] Rash        Medication List     STOP taking these medications    gabapentin 600 MG tablet Commonly known as: NEURONTIN   Latuda 120 MG Tabs Generic drug: Lurasidone HCl   NYSTATIN-TRIAMCINOLONE EX       TAKE these medications      Indication  albuterol (2.5 MG/3ML) 0.083% nebulizer solution Commonly known as: PROVENTIL Take 3 mLs (2.5 mg total) by nebulization every 6 (six) hours  as needed for wheezing or shortness of breath.  Indication: Spasm of Lung Air Passages   albuterol 108 (90 Base) MCG/ACT inhaler Commonly known as: VENTOLIN HFA Inhale 2 puffs into the lungs every 6 (six) hours as needed for wheezing or shortness of breath.  Indication: Asthma   budesonide-formoterol 160-4.5 MCG/ACT inhaler Commonly known as: Symbicort Inhale 2 puffs into the lungs in the morning and at bedtime.    calcium carbonate 1250 (500 Ca)  MG tablet Commonly known as: OS-CAL - dosed in mg of elemental calcium Take 1 tablet by mouth daily with breakfast.  Indication: Supplementation   doxycycline 100 MG tablet Commonly known as: VIBRA-TABS Take 1 tablet (100 mg total) by mouth every 12 (twelve) hours for 7 days. What changed: when to take this  Indication: Infection Under the Skin   fluconazole 150 MG tablet Commonly known as: DIFLUCAN Take 1 tablet (150 mg total) by mouth every 3 (three) days. 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 What changed: when to take this  Indication: Allergic Rhinitis   hydrOXYzine 50 MG capsule Commonly known as: VISTARIL Take 1 capsule (50 mg total) by mouth at bedtime as needed. What changed:  when to take this reasons to take this  Indication: Feeling Anxious   IRON PO Take 1 tablet by mouth daily.  Indication: iron deficiency   levonorgestrel-ethinyl estradiol 0.15-30 MG-MCG tablet Commonly known as: NORDETTE Take 1 tablet by mouth at bedtime.  Indication: Birth Control Treatment   montelukast 10 MG tablet Commonly known as: SINGULAIR Take 1 tablet (10 mg total) by mouth at bedtime.  Indication: Asthma   multivitamin with minerals Tabs tablet Take 1 tablet by mouth daily.  Indication: vitamin   neomycin-bacitracin-polymyxin Oint Commonly known as: NEOSPORIN Apply 1  Application topically 2 (two) times daily.  Indication: infected skin   nicotine 21 mg/24hr patch Commonly known as: NICODERM CQ - dosed in mg/24 hours Place 1 patch (21 mg total) onto the skin daily for 28 doses. Start taking on: November 18, 2021  Indication: Nicotine Addiction   OLANZapine zydis 5 MG disintegrating tablet Commonly known as: ZyPREXA Zydis Take 1 tablet (5 mg total) by mouth every 6 (six) hours as needed.  Indication: Anxiety   omeprazole 40 MG capsule Commonly known as: PRILOSEC Take 1 capsule (40 mg total) by mouth 2 (two) times daily.  Indication: Heartburn   Oxcarbazepine 300 MG tablet Commonly known as: TRILEPTAL Take 1 tablet (300 mg total) by mouth 2 (two) times daily.  Indication: bipolar disorder   paliperidone 6 MG 24 hr tablet Commonly known as: INVEGA Take 1 tablet (6 mg total) by mouth at bedtime.  Indication: Bipolar disorder   prazosin 1 MG capsule Commonly known as: MINIPRESS Take 1 mg by mouth daily.  Indication: Frightening Dreams   prazosin 1 MG capsule Commonly known as: MINIPRESS Take 3 capsules (3 mg total) by mouth at bedtime.  Indication: Frightening Dreams   predniSONE 20 MG tablet Commonly known as: DELTASONE Take 1 tablet (20 mg total) by mouth daily with breakfast for 2 days. Start taking on: November 18, 2021 What changed:  medication strength See the new instructions.  Indication: Asthma   traZODone 50 MG tablet Commonly known as: DESYREL Take 1 tablet (50 mg total) by mouth at bedtime.  Indication: Trouble Sleeping          Follow-up recommendations:   Activity: as tolerated  Diet: heart healthy  Other: -Follow-up with your outpatient psychiatric provider -instructions on appointment date, time, and address (location) are provided to you in discharge paperwork.  -Take your psychiatric medications as prescribed at discharge - instructions are provided to you in the discharge paperwork  -Follow-up with  outpatient primary care doctor and other specialists -for management of chronic medical disease, including: Asthma  -Recommend abstinence from  alcohol, tobacco, and other illicit drug use at discharge.   -If your psychiatric symptoms recur, worsen, or if you have side effects to your psychiatric medications, call your outpatient psychiatric provider, 911, [phone] or go to the nearest emergency department.  -If suicidal thoughts recur, call your outpatient psychiatric provider, 911, [phone] or go to the nearest emergency department.  Signed: Rosezetta Schlatter, MD 11/17/2021, 12:07 PM

## 2021-11-17 NOTE — Group Note (Signed)
Recreation Therapy Group Note   Group Topic:Problem Solving  Group Date: 11/17/2021 Start Time: 0930 End Time: 1012 Facilitators: Amberlyn Martinezgarcia-McCall, LRT,CTRS Location: [address] Day[address]   Goal Area(s) Addresses:  Patient will effectively work with peer towards shared goal.  Patient will identify skills used to make activity successful.  Patient will identify how skills used during activity can be used to reach post d/c goals.   Group Description: Straw Bridge. In teams of 3-5, patients were given 15 plastic drinking straws and an equal length of masking tape. Using the materials provided, patients were instructed to build a free standing bridge-like structure to suspend an everyday item (ex: puzzle box) off of the floor or table surface. All materials were required to be used by the team in their design. LRT facilitated post-activity discussion reviewing team process. Patients were encouraged to reflect how the skills used in this activity can be generalized to daily life post discharge.    Affect/Mood: Appropriate   Participation Level: Engaged   Participation Quality: Independent   Behavior: Appropriate   Speech/Thought Process: Focused   Insight: Good   Judgement: Good   Modes of Intervention: STEM Activity   Patient Response to Interventions:  Engaged   Education Outcome:  Acknowledges education and In group clarification offered    Clinical Observations/Individualized Feedback: Pt was appropriate and worked well with peers in completing activity.     Plan: Continue to engage patient in RT group sessions 2-3x/week.   Katti Pelle-McCall, LRT,CTRS  11/17/2021 1:11 PM

## 2021-11-17 NOTE — Progress Notes (Signed)
  [facility] Adult Case Management Discharge Plan :  Will you be returning to the same living situation after discharge:  Yes,  Pt will discharge with a friend At discharge, do you have transportation home?: Yes,  a friend Do you have the ability to pay for your medications: Yes,  insured  Release of information consent forms completed and in the chart;  Patient's signature needed at discharge.  Patient to Follow up at:   Next level of care provider has access to [place] and Suicide Prevention discussed: No     Has patient been referred to the Quitline?: Patient refused referral  Patient has been referred for addiction treatment: N/A Patient to continue working towards treatment goals after discharge. Patient no longer meets criteria for inpatient criteria per attending physician. Continue taking medications as prescribed, nursing to provide instructions at discharge. Follow up with all scheduled appointments.   Olusegun Gerstenberger S Romeka Scifres, [name] 11/17/2021, 10:43 AM
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
## 2021-11-26 ENCOUNTER — Encounter: Payer: Self-pay | Admitting: Internal Medicine

## 2021-11-26 ENCOUNTER — Ambulatory Visit (INDEPENDENT_AMBULATORY_CARE_PROVIDER_SITE_OTHER): Payer: 59 | Admitting: Internal Medicine

## 2021-11-26 VITALS — BP 122/68 | HR 114 | Temp 97.6°F | Ht 62.0 in | Wt 218.0 lb

## 2021-11-26 DIAGNOSIS — W57XXXA Bitten or stung by nonvenomous insect and other nonvenomous arthropods, initial encounter: Secondary | ICD-10-CM | POA: Diagnosis not present

## 2021-11-26 DIAGNOSIS — L03211 Cellulitis of face: Secondary | ICD-10-CM

## 2021-11-26 DIAGNOSIS — S1086XA Insect bite of other specified part of neck, initial encounter: Secondary | ICD-10-CM

## 2021-11-26 DIAGNOSIS — J45901 Unspecified asthma with (acute) exacerbation: Secondary | ICD-10-CM | POA: Insufficient documentation

## 2021-11-26 DIAGNOSIS — Z202 Contact with and (suspected) exposure to infections with a predominantly sexual mode of transmission: Secondary | ICD-10-CM

## 2021-11-26 DIAGNOSIS — J45909 Unspecified asthma, uncomplicated: Secondary | ICD-10-CM | POA: Insufficient documentation

## 2021-11-26 DIAGNOSIS — J4531 Mild persistent asthma with (acute) exacerbation: Secondary | ICD-10-CM | POA: Diagnosis not present

## 2021-11-26 MED ORDER — FLUCONAZOLE 150 MG PO TABS: 150.0000 mg | ORAL_TABLET | ORAL | 1 refills | Status: DC

## 2021-11-26 MED ORDER — METHYLPREDNISOLONE ACETATE 80 MG/ML IJ SUSP
80.0000 mg | Freq: Once | INTRAMUSCULAR | Status: AC
  Administered 2021-11-26: 80 mg via INTRAMUSCULAR

## 2021-11-26 MED ORDER — AMOXICILLIN-POT CLAVULANATE 875-125 MG PO TABS: 1.0000 | ORAL_TABLET | Freq: Two times a day (BID) | ORAL | 0 refills | Status: DC

## 2021-11-26 MED ORDER — PREDNISONE 10 MG PO TABS: ORAL_TABLET | ORAL | 0 refills | Status: DC

## 2021-11-26 NOTE — Assessment & Plan Note (Signed)
To right submandibular area - no s/s infetion, for topical benadryl cream otc prn

## 2021-11-26 NOTE — Assessment & Plan Note (Signed)
Left cheek advised for removal foreign body, now for augmentin course,  to f/u any worsening symptoms or concerns

## 2021-11-26 NOTE — Patient Instructions (Addendum)
You had the steroid shot today  Please take all new medication as prescribed - the prednisone, antibiotic, and fluconozole if needed  Also, ok to use the OTC Benadryl Cream as needed for the itching rash  Please continue all other medications as before, and refills have been done if requested.  Please have the pharmacy call with any other refills you may need.  Please keep your appointments with your specialists as you may have planned  Please go to the LAB at the blood drawing area for the tests to be done - the STD testing  You will be contacted by phone if any changes need to be made immediately.  Otherwise, you will receive a letter about your results with an explanation, but please check with MyChart first.  Please remember to sign up for MyChart if you have not done so, as this will be important to you in the future with finding out test results, communicating by private email, and scheduling acute appointments online when needed.

## 2021-11-26 NOTE — Progress Notes (Signed)
[name], female   DOB: 03-Apr-1985, 36 y.o.   MRN: [id number]        Chief Complaint: follow up here to f/u left cheek infection, asthma exacerbation,         HPI:  [name] is a 36 y.o. female here with c/o f/u left cheek infection, better with doxy, then worse again this time with swelling red tender nondiscrete but about 2 cm surrounding area, without observed drainage. Denies high fever chills.  Incidentally also with 2 days wrosening mild wheezing with scant prod cough and sob, despite increased inhaler use.  Also has a new insect sting site to the neck at right submandibular area with itching.  Also endorses recent unprotected intercourse, asks for STD testing.  LMP last wk.          Wt Readings from Last 3 Encounters:  11/26/21 218 lb (98.9 kg)  11/13/21 212 lb 1.3 oz (96.2 kg)  11/12/21 212 lb (96.2 kg)   BP Readings from Last 3 Encounters:  11/26/21 122/68  11/13/21 (!) 148/94  11/12/21 122/68         Past Medical History:  Diagnosis Date   Allergy    Anxiety    Asthma    Bipolar 2 disorder ([place])    Borderline personality disorder ([place])    Depression    Eczema    Former smoker    Obesity    PTSD (post-traumatic stress disorder)    Urticaria    Past Surgical History:  Procedure Laterality Date   Thumb surgery Left     reports that she has been smoking cigarettes. She has a 4.50 pack-year smoking history. She has been exposed to tobacco smoke. She has never used smokeless tobacco. She reports current alcohol use of about 8.0 standard drinks of alcohol per week. She reports current drug use. Frequency: 4.00 times per week. Drug: Marijuana. family history includes Allergic rhinitis in her paternal grandmother; Alzheimer's disease in her maternal grandmother; Asthma in her paternal grandmother; Healthy in her father and mother; Skin cancer in her paternal grandmother. Allergies  Allergen Reactions   Benztropine Other (See Comments)     agitation     Other Other (See Comments)    Opiates- history of dependency    Lamictal [Lamotrigine] Rash   Current Outpatient Medications on File Prior to Visit  Medication Sig Dispense Refill   albuterol (PROVENTIL) (2.5 MG/3ML) 0.083% nebulizer solution Take 3 mLs (2.5 mg total) by nebulization every 6 (six) hours as needed for wheezing or shortness of breath. 150 mL 1   albuterol (VENTOLIN HFA) 108 (90 Base) MCG/ACT inhaler Inhale 2 puffs into the lungs every 6 (six) hours as needed for wheezing or shortness of breath. 8 g 2   budesonide-formoterol (SYMBICORT) 160-4.5 MCG/ACT inhaler Inhale 2 puffs into the lungs in the morning and at bedtime. 1 each 5   calcium carbonate (OS-CAL - DOSED IN MG OF ELEMENTAL CALCIUM) 1250 (500 Ca) MG tablet Take 1 tablet by mouth daily with breakfast.     Ferrous Sulfate (IRON PO) Take 1 tablet by mouth daily.     fluticasone (FLONASE) 50 MCG/ACT nasal spray Place 2 sprays into both nostrils daily. (Patient taking differently: Place 2 sprays into both nostrils at bedtime.) 1 g 5   hydrOXYzine (VISTARIL) 50 MG capsule Take 1 capsule (50 mg total) by mouth at bedtime as needed. (Patient taking differently: Take 50 mg by mouth 3 (three) times daily as needed for  anxiety (sleep).) 30 capsule 1   levonorgestrel-ethinyl estradiol (NORDETTE) 0.15-30 MG-MCG tablet Take 1 tablet by mouth at bedtime.     montelukast (SINGULAIR) 10 MG tablet Take 1 tablet (10 mg total) by mouth at bedtime. 30 tablet 1   Multiple Vitamin (MULTIVITAMIN WITH MINERALS) TABS tablet Take 1 tablet by mouth daily. 30 tablet 0   neomycin-bacitracin-polymyxin (NEOSPORIN) OINT Apply 1 Application topically 2 (two) times daily.     nicotine (NICODERM CQ - DOSED IN MG/24 HOURS) 21 mg/24hr patch Place 1 patch (21 mg total) onto the skin daily for 28 doses. 28 patch 0   OLANZapine zydis (ZYPREXA ZYDIS) 5 MG disintegrating tablet Take 1 tablet (5 mg total) by mouth every 6 (six) hours as needed. 30  tablet 0   omeprazole (PRILOSEC) 40 MG capsule Take 1 capsule (40 mg total) by mouth 2 (two) times daily. 60 capsule 5   Oxcarbazepine (TRILEPTAL) 300 MG tablet Take 1 tablet (300 mg total) by mouth 2 (two) times daily. 60 tablet 1   paliperidone (INVEGA) 6 MG 24 hr tablet Take 1 tablet (6 mg total) by mouth at bedtime. 30 tablet 0   prazosin (MINIPRESS) 1 MG capsule Take 3 capsules (3 mg total) by mouth at bedtime. 90 capsule 1   prazosin (MINIPRESS) 1 MG capsule Take 1 mg by mouth daily.     traZODone (DESYREL) 50 MG tablet Take 1 tablet (50 mg total) by mouth at bedtime. 30 tablet 0   No current facility-administered medications on file prior to visit.        ROS:  All others reviewed and negative.  Objective        PE:  BP 122/68   Pulse (!) 114   Temp 97.6 F (36.4 C)   Ht '5\' 2"'$  (1.575 m)   Wt 218 lb (98.9 kg)   LMP 10/13/2021   SpO2 98%   BMI 39.87 kg/m                 Constitutional: Pt appears in NAD               HENT: Head: NCAT.                Right Ear: External ear normal.                 Left Ear: External ear normal.                Eyes: . Pupils are equal, round, and reactive to light. Conjunctivae and EOM are normal;  left cheek with 1 -2 cm area red, tender               Nose: without d/c or deformity               Neck: Neck supple. Gross normal ROM               Cardiovascular: Normal rate and regular rhythm.                 Pulmonary/Chest: Effort normal and breath sounds without rales but with few bilat wheezing.                Abd:  Soft, NT, ND, + BS, no organomegaly               Neurological: Pt is alert. At baseline orientation, motor grossly intact               Skin:  Skin is warm. LE edema - none, insect sting site right submandibular with 20 mm area red, slightly raised itchy nontender               Psychiatric: Pt behavior is normal without agitation   Micro: none  Cardiac tracings I have personally interpreted today:  none  Pertinent  Radiological findings (summarize): none   Lab Results  Component Value Date   WBC 11.7 (H) 11/13/2021   HGB 15.9 (H) 11/13/2021   HCT 47.3 (H) 11/13/2021   PLT 298 11/13/2021   GLUCOSE 109 (H) 11/13/2021   CHOL 177 11/12/2021   TRIG 90.0 11/12/2021   HDL 60.90 11/12/2021   LDLCALC 99 11/12/2021   ALT 19 11/13/2021   AST 20 11/13/2021   NA 140 11/13/2021   K 4.0 11/13/2021   CL 111 11/13/2021   CREATININE 0.66 11/13/2021   BUN 6 11/13/2021   CO2 20 (L) 11/13/2021   TSH 2.41 11/12/2021   HGBA1C 5.6 11/12/2021   Assessment/Plan:  MESHELLE HOLNESS is a 36 y.o. White or Caucasian [1] female with  has a past medical history of Allergy, Anxiety, Asthma, Bipolar 2 disorder ([place]), Borderline personality disorder ([place]), Depression, Eczema, Former smoker, Obesity, PTSD (post-traumatic stress disorder), and Urticaria.  STD exposure [name] for STD testing as ordered including hiv  Asthma exacerbation Mild, for depomedrol I'm 80 mg, prednisone taper, cont inhaler prn,  to f/u any worsening symptoms or concerns  Cellulitis Left cheek advised for removal foreign body, now for augmentin course,  to f/u any worsening symptoms or concerns   Insect bite To right submandibular area - no s/s infetion, for topical benadryl cream otc prn  Followup: Return if symptoms worsen or fail to improve.  Cathlean Cower, MD 11/26/2021 8:51 PM [facility]

## 2021-11-26 NOTE — Assessment & Plan Note (Addendum)
Mild, for depomedrol I'm 80 mg, prednisone taper, cont inhaler prn,  to f/u any worsening symptoms or concerns

## 2021-11-26 NOTE — Assessment & Plan Note (Signed)
[place] for STD testing as ordered including hiv

## 2021-11-27 LAB — HIV ANTIBODY (ROUTINE TESTING W REFLEX): HIV 1&2 Ab, 4th Generation: NONREACTIVE

## 2021-11-27 LAB — RPR: RPR Ser Ql: NONREACTIVE

## 2021-11-27 LAB — HSV 2 ANTIBODY, IGG: HSV 2 Glycoprotein G Ab, IgG: <0.9 index

## 2021-11-29 LAB — GC/CHLAMYDIA PROBE AMP
Chlamydia trachomatis, NAA: NEGATIVE
Neisseria Gonorrhoeae by PCR: NEGATIVE

## 2021-12-18 ENCOUNTER — Other Ambulatory Visit: Payer: Self-pay | Admitting: Allergy & Immunology

## 2021-12-18 NOTE — Telephone Encounter (Signed)
Rx SENT REFILL REQUEST--  paliperidone (INVEGA) 6 MG 24 hr tablet

## 2021-12-22 ENCOUNTER — Encounter (HOSPITAL_COMMUNITY): Payer: Self-pay | Admitting: Psychiatry

## 2021-12-22 ENCOUNTER — Telehealth (INDEPENDENT_AMBULATORY_CARE_PROVIDER_SITE_OTHER): Payer: 59 | Admitting: Psychiatry

## 2021-12-22 DIAGNOSIS — F4310 Post-traumatic stress disorder, unspecified: Secondary | ICD-10-CM

## 2021-12-22 DIAGNOSIS — F3132 Bipolar disorder, current episode depressed, moderate: Secondary | ICD-10-CM

## 2021-12-22 DIAGNOSIS — F603 Borderline personality disorder: Secondary | ICD-10-CM | POA: Diagnosis not present

## 2021-12-22 DIAGNOSIS — F3181 Bipolar II disorder: Secondary | ICD-10-CM

## 2021-12-22 MED ORDER — PRAZOSIN HCL 1 MG PO CAPS: 1.0000 mg | ORAL_CAPSULE | Freq: Every day | ORAL | 1 refills | Status: DC

## 2021-12-22 MED ORDER — PRAZOSIN HCL 1 MG PO CAPS: 3.0000 mg | ORAL_CAPSULE | Freq: Every day | ORAL | 1 refills | Status: DC

## 2021-12-22 MED ORDER — PALIPERIDONE ER 6 MG PO TB24: 6.0000 mg | ORAL_TABLET | Freq: Every day | ORAL | 0 refills | Status: DC

## 2021-12-22 MED ORDER — OXCARBAZEPINE 300 MG PO TABS: 300.0000 mg | ORAL_TABLET | Freq: Two times a day (BID) | ORAL | 1 refills | Status: DC

## 2021-12-22 MED ORDER — OLANZAPINE 5 MG PO TBDP: 5.0000 mg | ORAL_TABLET | Freq: Four times a day (QID) | ORAL | 0 refills | Status: DC | PRN

## 2021-12-22 MED ORDER — HYDROXYZINE PAMOATE 50 MG PO CAPS: 50.0000 mg | ORAL_CAPSULE | Freq: Every day | ORAL | 1 refills | Status: DC | PRN
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
## 2021-12-23 ENCOUNTER — Ambulatory Visit (INDEPENDENT_AMBULATORY_CARE_PROVIDER_SITE_OTHER): Payer: 59 | Admitting: Internal Medicine

## 2021-12-23 VITALS — BP 120/66 | HR 72 | Temp 98.2°F | Ht 62.0 in | Wt 221.0 lb

## 2021-12-23 DIAGNOSIS — E538 Deficiency of other specified B group vitamins: Secondary | ICD-10-CM | POA: Diagnosis not present

## 2021-12-23 DIAGNOSIS — E559 Vitamin D deficiency, unspecified: Secondary | ICD-10-CM

## 2021-12-23 DIAGNOSIS — L03211 Cellulitis of face: Secondary | ICD-10-CM | POA: Diagnosis not present

## 2021-12-23 DIAGNOSIS — R21 Rash and other nonspecific skin eruption: Secondary | ICD-10-CM | POA: Diagnosis not present

## 2021-12-23 MED ORDER — TRIAMCINOLONE ACETONIDE 0.1 % EX CREA: 1.0000 | TOPICAL_CREAM | Freq: Two times a day (BID) | CUTANEOUS | 1 refills | Status: DC

## 2021-12-23 NOTE — Patient Instructions (Signed)
Please take all new medication as prescribed - the steroid cream  Please continue all other medications as before, and refills have been done if requested.  Please have the pharmacy call with any other refills you may need.  Please keep your appointments with your specialists as you may have planned  Please make an Appointment to return in 6 months, or sooner if needed

## 2021-12-23 NOTE — Progress Notes (Unsigned)
[name], female   DOB: 02-06-1985, 36 y.o.   MRN: [id number]        Chief Complaint: follow up left face cellulitis, itchy rash areas, low vit d       HPI:  [name] is a 36 y.o. female here to f/u s/p recent antibx tx augmentin, area of concern left face piercing has resolved redness tender, swelling, denies fever, but does have a nontender area adjacent to the piercing approx 1/2 cm subq, iimmobile and non fluctuant.  Also incidentally has multiple areas scaly itchy erythema to legs.         Wt Readings from Last 3 Encounters:  12/23/21 221 lb (100.2 kg)  11/26/21 218 lb (98.9 kg)  11/13/21 212 lb 1.3 oz (96.2 kg)   BP Readings from Last 3 Encounters:  12/23/21 120/66  11/26/21 122/68  11/13/21 (!) 148/94         Past Medical History:  Diagnosis Date   Allergy    Anxiety    Asthma    Bipolar 2 disorder ([place])    Borderline personality disorder ([place])    Depression    Eczema    Former smoker    Obesity    PTSD (post-traumatic stress disorder)    Urticaria    Past Surgical History:  Procedure Laterality Date   Thumb surgery Left     reports that she has been smoking cigarettes. She has a 4.50 pack-year smoking history. She has been exposed to tobacco smoke. She has never used smokeless tobacco. She reports current alcohol use of about 8.0 standard drinks of alcohol per week. She reports current drug use. Frequency: 4.00 times per week. Drug: Marijuana. family history includes Allergic rhinitis in her paternal grandmother; Alzheimer's disease in her maternal grandmother; Asthma in her paternal grandmother; Healthy in her father and mother; Skin cancer in her paternal grandmother. Allergies  Allergen Reactions   Benztropine Other (See Comments)    agitation     Other Other (See Comments)    Opiates- history of dependency    Lamictal [Lamotrigine] Rash   Current Outpatient Medications on File Prior to Visit  Medication Sig Dispense Refill    albuterol (PROVENTIL) (2.5 MG/3ML) 0.083% nebulizer solution Take 3 mLs (2.5 mg total) by nebulization every 6 (six) hours as needed for wheezing or shortness of breath. 150 mL 1   albuterol (VENTOLIN HFA) 108 (90 Base) MCG/ACT inhaler Inhale 2 puffs into the lungs every 6 (six) hours as needed for wheezing or shortness of breath. 8 g 2   budesonide-formoterol (SYMBICORT) 160-4.5 MCG/ACT inhaler Inhale 2 puffs into the lungs in the morning and at bedtime. 1 each 5   calcium carbonate (OS-CAL - DOSED IN MG OF ELEMENTAL CALCIUM) 1250 (500 Ca) MG tablet Take 1 tablet by mouth daily with breakfast.     Ferrous Sulfate (IRON PO) Take 1 tablet by mouth daily.     fluticasone (FLONASE) 50 MCG/ACT nasal spray Place 2 sprays into both nostrils daily. (Patient taking differently: Place 2 sprays into both nostrils at bedtime.) 1 g 5   hydrOXYzine (VISTARIL) 50 MG capsule Take 1 capsule (50 mg total) by mouth daily as needed. 30 capsule 1   levonorgestrel-ethinyl estradiol (NORDETTE) 0.15-30 MG-MCG tablet Take 1 tablet by mouth at bedtime.     montelukast (SINGULAIR) 10 MG tablet Take 1 tablet (10 mg total) by mouth at bedtime. 30 tablet 0   Multiple Vitamin (MULTIVITAMIN WITH MINERALS) TABS tablet Take 1  tablet by mouth daily. 30 tablet 0   neomycin-bacitracin-polymyxin (NEOSPORIN) OINT Apply 1 Application topically 2 (two) times daily.     OLANZapine zydis (ZYPREXA ZYDIS) 5 MG disintegrating tablet Take 1 tablet (5 mg total) by mouth every 6 (six) hours as needed. 30 tablet 0   omeprazole (PRILOSEC) 40 MG capsule Take 1 capsule (40 mg total) by mouth 2 (two) times daily. 60 capsule 5   Oxcarbazepine (TRILEPTAL) 300 MG tablet Take 1 tablet (300 mg total) by mouth 2 (two) times daily. 60 tablet 1   paliperidone (INVEGA) 6 MG 24 hr tablet Take 1 tablet (6 mg total) by mouth at bedtime. 30 tablet 0   prazosin (MINIPRESS) 1 MG capsule Take 3 capsules (3 mg total) by mouth at bedtime. 90 capsule 1   prazosin  (MINIPRESS) 1 MG capsule Take 1 capsule (1 mg total) by mouth daily. 30 capsule 1   No current facility-administered medications on file prior to visit.        ROS:  All others reviewed and negative.  Objective        PE:  BP 120/66 (BP Location: Left Arm, Patient Position: Sitting, Cuff Size: Large)   Pulse 72   Temp 98.2 F (36.8 C) (Oral)   Ht '5\' 2"'$  (1.575 m)   Wt 221 lb (100.2 kg)   BMI 40.42 kg/m                 Constitutional: Pt appears in NAD               HENT: Head: NCAT.                Right Ear: External ear normal.                 Left Ear: External ear normal.                Eyes: . Pupils are equal, round, and reactive to light. Conjunctivae and EOM are normal;  left face piercing area  - nontender area adjacent to the piercing approx 1/2 cm subq, iimmobile and non fluctuant.               Nose: without d/c or deformity               Neck: Neck supple. Gross normal ROM               Cardiovascular: Normal rate and regular rhythm.                 Pulmonary/Chest: Effort normal and breath sounds without rales or wheezing.                Abd:  Soft, NT, ND, + BS, no organomegaly               Neurological: Pt is alert. At baseline orientation, motor grossly intact               Skin: Skin is warm.  LE edema - none, multiple small superficial scaly erythema to several areas of the legs               Psychiatric: Pt behavior is normal without agitation   Micro: none  Cardiac tracings I have personally interpreted today:  none  Pertinent Radiological findings (summarize): none   Lab Results  Component Value Date   WBC 11.7 (H) 11/13/2021   HGB 15.9 (H) 11/13/2021   HCT 47.3 (H) 11/13/2021  PLT 298 11/13/2021   GLUCOSE 109 (H) 11/13/2021   CHOL 177 11/12/2021   TRIG 90.0 11/12/2021   HDL 60.90 11/12/2021   LDLCALC 99 11/12/2021   ALT 19 11/13/2021   AST 20 11/13/2021   NA 140 11/13/2021   K 4.0 11/13/2021   CL 111 11/13/2021   CREATININE 0.66 11/13/2021    BUN 6 11/13/2021   CO2 20 (L) 11/13/2021   TSH 2.41 11/12/2021   HGBA1C 5.6 11/12/2021   Assessment/Plan:  [name] is a 36 y.o. White or Caucasian [1] female with  has a past medical history of Allergy, Anxiety, Asthma, Bipolar 2 disorder ([place]), Borderline personality disorder ([place]), Depression, Eczema, Former smoker, Obesity, PTSD (post-traumatic stress disorder), and Urticaria.  Cellulitis Essentially resolved with some post inflammatory induration left, hopefully to continue to resolved, pt has no indication for further antibx or ENT referral at this time  Rash C/w dermatitis, no evidence for malignancy concern, for triam cr prn  Vitamin D deficiency Last vitamin D Lab Results  Component Value Date   VD25OH 38.20 11/12/2021   Low, reminded to start oral replacement   B12 deficiency Lab Results  Component Value Date   VITAMINB12 216 11/12/2021   Low, reminded to start oral replacement - b12 1000 mcg qd  Followup: Return in about 6 months (around 06/23/2022).  Cathlean Cower, MD 12/25/2021 12:56 PM [facility]

## 2021-12-25 ENCOUNTER — Encounter: Payer: Self-pay | Admitting: Internal Medicine

## 2021-12-25 DIAGNOSIS — R21 Rash and other nonspecific skin eruption: Secondary | ICD-10-CM | POA: Insufficient documentation

## 2021-12-25 NOTE — Assessment & Plan Note (Signed)
Essentially resolved with some post inflammatory induration left, hopefully to continue to resolved, pt has no indication for further antibx or ENT referral at this time

## 2021-12-25 NOTE — Assessment & Plan Note (Signed)
Lab Results  Component Value Date   VITAMINB12 216 11/12/2021   Low, reminded to start oral replacement - b12 1000 mcg qd

## 2021-12-25 NOTE — Assessment & Plan Note (Signed)
C/w dermatitis, no evidence for malignancy concern, for triam cr prn

## 2021-12-25 NOTE — Assessment & Plan Note (Signed)
Last vitamin D Lab Results  Component Value Date   VD25OH 38.20 11/12/2021   Low, reminded to start oral replacement

## 2022-01-01 ENCOUNTER — Encounter: Payer: Self-pay | Admitting: Internal Medicine

## 2022-01-01 ENCOUNTER — Telehealth (INDEPENDENT_AMBULATORY_CARE_PROVIDER_SITE_OTHER): Payer: 59 | Admitting: Internal Medicine

## 2022-01-01 DIAGNOSIS — J4531 Mild persistent asthma with (acute) exacerbation: Secondary | ICD-10-CM | POA: Diagnosis not present

## 2022-01-01 DIAGNOSIS — L0201 Cutaneous abscess of face: Secondary | ICD-10-CM | POA: Diagnosis not present

## 2022-01-01 DIAGNOSIS — F39 Unspecified mood [affective] disorder: Secondary | ICD-10-CM | POA: Diagnosis not present

## 2022-01-01 MED ORDER — AMOXICILLIN-POT CLAVULANATE 875-125 MG PO TABS: 1.0000 | ORAL_TABLET | Freq: Two times a day (BID) | ORAL | 0 refills | Status: DC

## 2022-01-01 MED ORDER — PREDNISONE 10 MG PO TABS: ORAL_TABLET | ORAL | 0 refills | Status: DC

## 2022-01-01 NOTE — Assessment & Plan Note (Signed)
Mild to mod, for prednisone taper,  to f/u any worsening symptoms or concerns 

## 2022-01-01 NOTE — Assessment & Plan Note (Signed)
Currently stable , cont invega 6 mg qd, minipress 1 mg am, 3 mg qhs

## 2022-01-01 NOTE — Progress Notes (Signed)
[name], female   DOB: 11/21/1985, 36 y.o.   MRN: [id number]  Virtual Visit via Video Note  I connected with [name] on 01/01/22 at  3:40 PM EST by a video enabled telemedicine application and verified that I am speaking with the correct person using two identifiers.  Location of all participants today Patient: at home Provider: at office   I discussed the limitations of evaluation and management by telemedicine and the availability of in person appointments. The patient expressed understanding and agreed to proceed.  History of Present Illness: Here with increased redness, swelling and tenderness without drainage at the site of recent tx for cellulitis, this time with worsening subq firmness that is sore.  No high fever, chills, no ST, sinus symptoms or dental pain.  Pt denies chest pain, orthopnea, PND, increased LE swelling, palpitations, dizziness or syncope, but has had coincidental sob doe wheezing in the past 2-3 days, despite her asthma inhaler..   Pt denies polydipsia, polyuria, or new focal neuro s/s.   Denies worsening depressive symptoms, suicidal ideation, or panic; has ongoing anxiety, not increased recently.  Past Medical History:  Diagnosis Date   Allergy    Anxiety    Asthma    Bipolar 2 disorder ([place])    Borderline personality disorder ([place])    Depression    Eczema    Former smoker    Obesity    PTSD (post-traumatic stress disorder)    Urticaria    Past Surgical History:  Procedure Laterality Date   Thumb surgery Left     reports that she has been smoking cigarettes. She has a 4.50 pack-year smoking history. She has been exposed to tobacco smoke. She has never used smokeless tobacco. She reports current alcohol use of about 8.0 standard drinks of alcohol per week. She reports current drug use. Frequency: 4.00 times per week. Drug: Marijuana. family history includes Allergic rhinitis in her paternal grandmother; Alzheimer's disease in her  maternal grandmother; Asthma in her paternal grandmother; Healthy in her father and mother; Skin cancer in her paternal grandmother. Allergies  Allergen Reactions   Benztropine Other (See Comments)    agitation     Other Other (See Comments)    Opiates- history of dependency    Lamictal [Lamotrigine] Rash   Current Outpatient Medications on File Prior to Visit  Medication Sig Dispense Refill   albuterol (PROVENTIL) (2.5 MG/3ML) 0.083% nebulizer solution Take 3 mLs (2.5 mg total) by nebulization every 6 (six) hours as needed for wheezing or shortness of breath. 150 mL 1   albuterol (VENTOLIN HFA) 108 (90 Base) MCG/ACT inhaler Inhale 2 puffs into the lungs every 6 (six) hours as needed for wheezing or shortness of breath. 8 g 2   budesonide-formoterol (SYMBICORT) 160-4.5 MCG/ACT inhaler Inhale 2 puffs into the lungs in the morning and at bedtime. 1 each 5   calcium carbonate (OS-CAL - DOSED IN MG OF ELEMENTAL CALCIUM) 1250 (500 Ca) MG tablet Take 1 tablet by mouth daily with breakfast.     Ferrous Sulfate (IRON PO) Take 1 tablet by mouth daily.     fluticasone (FLONASE) 50 MCG/ACT nasal spray Place 2 sprays into both nostrils daily. (Patient taking differently: Place 2 sprays into both nostrils at bedtime.) 1 g 5   hydrOXYzine (VISTARIL) 50 MG capsule Take 1 capsule (50 mg total) by mouth daily as needed. 30 capsule 1   levonorgestrel-ethinyl estradiol (NORDETTE) 0.15-30 MG-MCG tablet Take 1 tablet by mouth at bedtime.  montelukast (SINGULAIR) 10 MG tablet Take 1 tablet (10 mg total) by mouth at bedtime. 30 tablet 0   Multiple Vitamin (MULTIVITAMIN WITH MINERALS) TABS tablet Take 1 tablet by mouth daily. 30 tablet 0   neomycin-bacitracin-polymyxin (NEOSPORIN) OINT Apply 1 Application topically 2 (two) times daily.     OLANZapine zydis (ZYPREXA ZYDIS) 5 MG disintegrating tablet Take 1 tablet (5 mg total) by mouth every 6 (six) hours as needed. 30 tablet 0   omeprazole (PRILOSEC) 40 MG  capsule Take 1 capsule (40 mg total) by mouth 2 (two) times daily. 60 capsule 5   Oxcarbazepine (TRILEPTAL) 300 MG tablet Take 1 tablet (300 mg total) by mouth 2 (two) times daily. 60 tablet 1   paliperidone (INVEGA) 6 MG 24 hr tablet Take 1 tablet (6 mg total) by mouth at bedtime. 30 tablet 0   prazosin (MINIPRESS) 1 MG capsule Take 3 capsules (3 mg total) by mouth at bedtime. 90 capsule 1   prazosin (MINIPRESS) 1 MG capsule Take 1 capsule (1 mg total) by mouth daily. 30 capsule 1   triamcinolone cream (KENALOG) 0.1 % Apply 1 Application topically 2 (two) times daily. 30 g 1   No current facility-administered medications on file prior to visit.    Observations/Objective: Alert, NAD, appropriate mood and affect, resps normal, cn 2-12 intact, moves all 4s, left face at piercing site is approx 1 cm area redness, tender, swelling Lab Results  Component Value Date   WBC 11.7 (H) 11/13/2021   HGB 15.9 (H) 11/13/2021   HCT 47.3 (H) 11/13/2021   PLT 298 11/13/2021   GLUCOSE 109 (H) 11/13/2021   CHOL 177 11/12/2021   TRIG 90.0 11/12/2021   HDL 60.90 11/12/2021   LDLCALC 99 11/12/2021   ALT 19 11/13/2021   AST 20 11/13/2021   NA 140 11/13/2021   K 4.0 11/13/2021   CL 111 11/13/2021   CREATININE 0.66 11/13/2021   BUN 6 11/13/2021   CO2 20 (L) 11/13/2021   TSH 2.41 11/12/2021   HGBA1C 5.6 11/12/2021   Assessment and Plan: See notes  Follow Up Instructions: See notes   I discussed the assessment and treatment plan with the patient. The patient was provided an opportunity to ask questions and all were answered. The patient agreed with the plan and demonstrated an understanding of the instructions.   The patient was advised to call back or seek an in-person evaluation if the symptoms worsen or if the condition fails to improve as anticipated   Cathlean Cower, MD

## 2022-01-01 NOTE — Assessment & Plan Note (Signed)
Mild to mod, for antibx course agumentin, refer ENT,  to f/u any worsening symptoms or concerns

## 2022-01-01 NOTE — Patient Instructions (Signed)
Please take all new medication as prescribed 

## 2022-01-09 ENCOUNTER — Other Ambulatory Visit: Payer: Self-pay | Admitting: Allergy & Immunology

## 2022-01-11 ENCOUNTER — Encounter: Payer: Self-pay | Admitting: Internal Medicine

## 2022-01-12 ENCOUNTER — Other Ambulatory Visit: Payer: Self-pay | Admitting: Internal Medicine

## 2022-01-12 DIAGNOSIS — B3731 Acute candidiasis of vulva and vagina: Secondary | ICD-10-CM

## 2022-01-12 MED ORDER — FLUCONAZOLE 150 MG PO TABS: 150.0000 mg | ORAL_TABLET | Freq: Once | ORAL | 1 refills | Status: AC

## 2022-01-12 NOTE — Telephone Encounter (Signed)
Patient is requesting fluconazole as augmentin has caused an yeast infection.

## 2022-01-12 NOTE — Telephone Encounter (Signed)
Could you assist with this? Patient is requesting Fluconazole as the Augmentin has caused a yeast infection. Preferred Pharmacy is [facility]

## 2022-01-12 NOTE — Telephone Encounter (Signed)
Patient called to follow up, She said it is making her really uncomfortable and wanted to know since Dr Jenny Reichmann is out today if someone else can send it in instead. She would like a call back at [phone] to let her know

## 2022-01-16 NOTE — Telephone Encounter (Signed)
Patient suspects that she has BV and would like meds. Appointment offered but patient declined.

## 2022-01-16 NOTE — Telephone Encounter (Signed)
She did schedule a VV for next week

## 2022-01-16 NOTE — Telephone Encounter (Signed)
Ok to let pt know, probably better if she calls her GYN for this problem, thanks

## 2022-01-17 ENCOUNTER — Other Ambulatory Visit (HOSPITAL_COMMUNITY): Payer: Self-pay | Admitting: Psychiatry

## 2022-01-20 ENCOUNTER — Telehealth (INDEPENDENT_AMBULATORY_CARE_PROVIDER_SITE_OTHER): Payer: Medicaid Other | Admitting: Psychiatry

## 2022-01-20 ENCOUNTER — Encounter (HOSPITAL_COMMUNITY): Payer: Self-pay | Admitting: Psychiatry

## 2022-01-20 ENCOUNTER — Other Ambulatory Visit (HOSPITAL_COMMUNITY)
Admission: RE | Admit: 2022-01-20 | Discharge: 2022-01-20 | Disposition: A | Discharge status: Home / Self Care | Payer: Medicaid Other | Source: Ambulatory Visit | Attending: Nurse Practitioner | Admitting: Nurse Practitioner

## 2022-01-20 ENCOUNTER — Ambulatory Visit (INDEPENDENT_AMBULATORY_CARE_PROVIDER_SITE_OTHER): Payer: Medicaid Other | Admitting: Nurse Practitioner

## 2022-01-20 ENCOUNTER — Encounter: Payer: Self-pay | Admitting: Nurse Practitioner

## 2022-01-20 VITALS — BP 112/72 | HR 140 | Ht 63.0 in | Wt 222.0 lb

## 2022-01-20 DIAGNOSIS — F5102 Adjustment insomnia: Secondary | ICD-10-CM

## 2022-01-20 DIAGNOSIS — Z124 Encounter for screening for malignant neoplasm of cervix: Secondary | ICD-10-CM

## 2022-01-20 DIAGNOSIS — Z113 Encounter for screening for infections with a predominantly sexual mode of transmission: Secondary | ICD-10-CM

## 2022-01-20 DIAGNOSIS — N898 Other specified noninflammatory disorders of vagina: Secondary | ICD-10-CM

## 2022-01-20 DIAGNOSIS — Z3009 Encounter for other general counseling and advice on contraception: Secondary | ICD-10-CM

## 2022-01-20 DIAGNOSIS — N760 Acute vaginitis: Secondary | ICD-10-CM

## 2022-01-20 DIAGNOSIS — F3181 Bipolar II disorder: Secondary | ICD-10-CM

## 2022-01-20 DIAGNOSIS — F4310 Post-traumatic stress disorder, unspecified: Secondary | ICD-10-CM | POA: Diagnosis not present

## 2022-01-20 DIAGNOSIS — B9689 Other specified bacterial agents as the cause of diseases classified elsewhere: Secondary | ICD-10-CM

## 2022-01-20 DIAGNOSIS — F419 Anxiety disorder, unspecified: Secondary | ICD-10-CM | POA: Diagnosis not present

## 2022-01-20 DIAGNOSIS — F603 Borderline personality disorder: Secondary | ICD-10-CM

## 2022-01-20 LAB — WET PREP FOR TRICH, YEAST, CLUE

## 2022-01-20 MED ORDER — PALIPERIDONE ER 6 MG PO TB24: 6.0000 mg | ORAL_TABLET | Freq: Every day | ORAL | 1 refills | Status: DC

## 2022-01-20 MED ORDER — TRAZODONE HCL 50 MG PO TABS: 50.0000 mg | ORAL_TABLET | Freq: Every evening | ORAL | 1 refills | Status: DC | PRN

## 2022-01-20 MED ORDER — METRONIDAZOLE 0.75 % VA GEL: 1.0000 | Freq: Every day | VAGINAL | 0 refills | Status: AC

## 2022-01-20 MED ORDER — HYDROXYZINE PAMOATE 50 MG PO CAPS: 50.0000 mg | ORAL_CAPSULE | Freq: Every day | ORAL | 1 refills | Status: DC | PRN

## 2022-01-20 MED ORDER — PRAZOSIN HCL 1 MG PO CAPS: 3.0000 mg | ORAL_CAPSULE | Freq: Every day | ORAL | 1 refills | Status: DC

## 2022-01-20 MED ORDER — OLANZAPINE 5 MG PO TBDP: 5.0000 mg | ORAL_TABLET | Freq: Two times a day (BID) | ORAL | 1 refills | Status: DC | PRN

## 2022-01-20 MED ORDER — OXCARBAZEPINE 300 MG PO TABS: 300.0000 mg | ORAL_TABLET | Freq: Two times a day (BID) | ORAL | 1 refills | Status: DC

## 2022-01-20 MED ORDER — PRAZOSIN HCL 1 MG PO CAPS: 1.0000 mg | ORAL_CAPSULE | Freq: Every day | ORAL | 1 refills | Status: DC
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
## 2022-01-20 NOTE — Progress Notes (Signed)
   Acute Office Visit  Subjective:    [name], female    DOB: 1985/06/12, 36 y.o.   MRN: [id number]   HPI 36 y.o. presents today as new patient. Complains of watery vaginal discharge and vulvovaginal irritation. [facility] OTC yeast treatment and Diflucan with no relief, actually felt the symptoms worsened. Would like STD screening and to discuss hormonal contraception. She is currently on COCs but considering LARC, specifically Nexplanon. Abnormal paps year ago, normal colpo, no intervention required. Last pap 01/2021 - normal. Requesting pap today. H/O bipolar disorder, PTSD, borderline personality disorder managed by psychiatry.    Review of Systems  Constitutional: Negative.   Genitourinary:  Positive for vaginal discharge and vaginal pain (Vulvovaginal irritation).       Objective:    Physical Exam Constitutional:      Appearance: Normal appearance. She is obese.  Genitourinary:    General: Normal vulva.     Vagina: Vaginal discharge present. No erythema.     Cervix: Normal.     Uterus: Normal.      BP 112/72   Pulse (!) 140   Ht '5\' 3"'$  (1.6 m)   Wt 222 lb (100.7 kg)   LMP 01/13/2022 (Approximate)   SpO2 97%   BMI 39.33 kg/m  Wt Readings from Last 3 Encounters:  01/20/22 222 lb (100.7 kg)  12/23/21 221 lb (100.2 kg)  11/26/21 218 lb (98.9 kg)        Patient informed chaperone available to be present for breast and/or pelvic exam. Patient has requested no chaperone to be present. Patient has been advised what will be completed during breast and pelvic exam.   Wet prep + clue cells (+ odor)  Assessment & Plan:   Problem List Items Addressed This Visit       Other   Vaginal discharge   Relevant Orders   WET PREP FOR [place], YEAST, [place] (Completed)   Other Visit Diagnoses     Bacterial vaginosis    -  Primary   Relevant Medications   metroNIDAZOLE (METROGEL) 0.75 % vaginal gel   Screening examination for STD (sexually transmitted disease)        Relevant Orders   RPR   HIV Antibody (routine testing w rflx)   Cytology - PAP( [place])   General counseling and advice on female contraception       Relevant Orders   Insertion of implanon rod   Encounter for Papanicolaou smear for cervical cancer screening       Relevant Orders   Cytology - PAP( [place])      Plan: Contraceptive options were reviewed, including hormonal methods, both combination (pill, patch, vaginal ring) and progesterone-only (pill, Depo Provera and Nexplanon), intrauterine devices (Mirena, [place], [place], and [place]), Phexxi, barrier methods (condoms, diaphragm) and female/female sterilization. The mechanisms, risks, benefits and side effects of all methods were discussed. Would like Nexplanon.   Wet prep positive for clue cells - Metrogel 0.75% nightly x 5 nights. STD panel, pap pending. Will return for Nexplanon insertion. Continue COCs until then.      Beverly Gammon DNP, 3:07 PM 01/20/2022

## 2022-01-21 LAB — RPR: RPR Ser Ql: NONREACTIVE

## 2022-01-21 LAB — HIV ANTIBODY (ROUTINE TESTING W REFLEX): HIV 1&2 Ab, 4th Generation: NONREACTIVE

## 2022-01-22 LAB — CYTOLOGY - PAP
Chlamydia: NEGATIVE
Comment: NEGATIVE
Comment: NEGATIVE
Comment: NORMAL
Diagnosis: NEGATIVE
Neisseria Gonorrhea: NEGATIVE
Trichomonas: NEGATIVE

## 2022-01-23 ENCOUNTER — Telehealth: Payer: 59 | Admitting: Internal Medicine

## 2022-01-29 ENCOUNTER — Other Ambulatory Visit: Payer: Self-pay | Admitting: Allergy & Immunology

## 2022-02-02 ENCOUNTER — Emergency Department (HOSPITAL_COMMUNITY)
Admission: EM | Admit: 2022-02-02 | Discharge: 2022-02-02 | Disposition: A | Discharge status: Home / Self Care | Payer: Medicaid Other | Attending: Emergency Medicine | Admitting: Emergency Medicine

## 2022-02-02 ENCOUNTER — Other Ambulatory Visit: Payer: Self-pay

## 2022-02-02 ENCOUNTER — Emergency Department (HOSPITAL_COMMUNITY): Payer: Medicaid Other

## 2022-02-02 DIAGNOSIS — J45909 Unspecified asthma, uncomplicated: Secondary | ICD-10-CM | POA: Insufficient documentation

## 2022-02-02 DIAGNOSIS — Z87891 Personal history of nicotine dependence: Secondary | ICD-10-CM | POA: Diagnosis not present

## 2022-02-02 DIAGNOSIS — R000 Tachycardia, unspecified: Secondary | ICD-10-CM | POA: Insufficient documentation

## 2022-02-02 DIAGNOSIS — J189 Pneumonia, unspecified organism: Secondary | ICD-10-CM | POA: Diagnosis not present

## 2022-02-02 DIAGNOSIS — Z1152 Encounter for screening for COVID-19: Secondary | ICD-10-CM | POA: Insufficient documentation

## 2022-02-02 DIAGNOSIS — Z7951 Long term (current) use of inhaled steroids: Secondary | ICD-10-CM | POA: Diagnosis not present

## 2022-02-02 DIAGNOSIS — R0602 Shortness of breath: Secondary | ICD-10-CM | POA: Diagnosis present

## 2022-02-02 LAB — CBC WITH DIFFERENTIAL/PLATELET
Abs Immature Granulocytes: 0.04 10*3/uL (ref 0.00–0.07)
Basophils Absolute: 0.1 10*3/uL (ref 0.0–0.1)
Basophils Relative: 1 %
Eosinophils Absolute: 0.2 10*3/uL (ref 0.0–0.5)
Eosinophils Relative: 2 %
HCT: 43.8 % (ref 36.0–46.0)
Hemoglobin: 14.3 g/dL (ref 12.0–15.0)
Immature Granulocytes: 0 %
Lymphocytes Relative: 32 %
Lymphs Abs: 3 10*3/uL (ref 0.7–4.0)
MCH: 30.6 pg (ref 26.0–34.0)
MCHC: 32.6 g/dL (ref 30.0–36.0)
MCV: 93.6 fL (ref 80.0–100.0)
Monocytes Absolute: 0.5 10*3/uL (ref 0.1–1.0)
Monocytes Relative: 5 %
Neutro Abs: 5.6 10*3/uL (ref 1.7–7.7)
Neutrophils Relative %: 60 %
Platelets: 368 10*3/uL (ref 150–400)
RBC: 4.68 MIL/uL (ref 3.87–5.11)
RDW: 12 % (ref 11.5–15.5)
WBC: 9.3 10*3/uL (ref 4.0–10.5)
nRBC: 0 % (ref 0.0–0.2)

## 2022-02-02 LAB — BASIC METABOLIC PANEL
Anion gap: 10 (ref 5–15)
BUN: 8 mg/dL (ref 6–20)
CO2: 21 mmol/L — ABNORMAL LOW (ref 22–32)
Calcium: 8.7 mg/dL — ABNORMAL LOW (ref 8.9–10.3)
Chloride: 109 mmol/L (ref 98–111)
Creatinine, Ser: 0.75 mg/dL (ref 0.44–1.00)
GFR, Estimated: >60 mL/min (ref >60)
Glucose, Bld: 101 mg/dL — ABNORMAL HIGH (ref 70–99)
Potassium: 3.7 mmol/L (ref 3.5–5.1)
Sodium: 140 mmol/L (ref 135–145)

## 2022-02-02 LAB — MAGNESIUM: Magnesium: 2 mg/dL (ref 1.7–2.4)

## 2022-02-02 LAB — RESP PANEL BY RT-PCR (RSV, FLU A&B, COVID)  RVPGX2
Influenza A by PCR: NEGATIVE
Influenza B by PCR: NEGATIVE
Resp Syncytial Virus by PCR: NEGATIVE
SARS Coronavirus 2 by RT PCR: NEGATIVE

## 2022-02-02 LAB — D-DIMER, QUANTITATIVE: D-Dimer, Quant: 0.85 ug/mL-FEU — ABNORMAL HIGH (ref 0.00–0.50)

## 2022-02-02 MED ORDER — IPRATROPIUM-ALBUTEROL 0.5-2.5 (3) MG/3ML IN SOLN
3.0000 mL | Freq: Once | RESPIRATORY_TRACT | Status: AC
  Administered 2022-02-02: 3 mL via RESPIRATORY_TRACT
  Filled 2022-02-02: qty 3

## 2022-02-02 MED ORDER — AMOXICILLIN-POT CLAVULANATE 875-125 MG PO TABS: 1.0000 | ORAL_TABLET | Freq: Two times a day (BID) | ORAL | 0 refills | Status: DC

## 2022-02-02 MED ORDER — SODIUM CHLORIDE 0.9 % IV BOLUS
1000.0000 mL | Freq: Once | INTRAVENOUS | Status: AC
  Administered 2022-02-02: 1000 mL via INTRAVENOUS

## 2022-02-02 MED ORDER — IOHEXOL 350 MG/ML SOLN
80.0000 mL | Freq: Once | INTRAVENOUS | Status: AC | PRN
  Administered 2022-02-02: 80 mL via INTRAVENOUS

## 2022-02-02 MED ORDER — SODIUM CHLORIDE (PF) 0.9 % IJ SOLN
INTRAMUSCULAR | Status: AC
  Filled 2022-02-02: qty 50

## 2022-02-02 MED ORDER — DOXYCYCLINE HYCLATE 100 MG PO CAPS: 100.0000 mg | ORAL_CAPSULE | Freq: Two times a day (BID) | ORAL | 0 refills | Status: DC

## 2022-02-02 MED ORDER — HYDROXYZINE HCL 25 MG PO TABS
25.0000 mg | ORAL_TABLET | Freq: Once | ORAL | Status: AC
  Administered 2022-02-02: 25 mg via ORAL
  Filled 2022-02-02: qty 1

## 2022-02-02 MED ORDER — METHYLPREDNISOLONE SODIUM SUCC 125 MG IJ SOLR
125.0000 mg | Freq: Once | INTRAMUSCULAR | Status: AC
  Administered 2022-02-02: 125 mg via INTRAVENOUS
  Filled 2022-02-02: qty 2

## 2022-02-02 NOTE — Discharge Instructions (Signed)
You have a pneumonia.  Take Augmentin and doxycycline twice daily for the next 7 days.  Follow-up with your main doctor this week for reevaluation.  Return to the ED for worsening symptoms, shortness of breath, severe chest pain, worsening condition.

## 2022-02-02 NOTE — ED Provider Notes (Signed)
MSE was initiated and I personally evaluated the patient and placed orders (if any) at  3:47 AM on February 02, 2022.  HPI:  [name] is a 37 y.o. female presents to the emergency department with complaints of 1 week of SOB not relieved with albuterol puffs.   ROS:  Positive: cough Negative: fever, chills  PE:  BP 116/75   Pulse (!) 117   Temp 97.9 F (36.6 C) (Oral)   Resp (!) 21   Ht '5\' 2"'$  (1.575 m)   Wt 100.7 kg   LMP 01/13/2022 (Approximate)   SpO2 98%   BMI 40.60 kg/m   General: Awake, no distress  HEENT: Atraumatic  Resp: Normal effort, lungs CTAB Cardiac: normal rate Abd: Nondistended, nontender  MSK: moves all extremities without difficulty Neuro: speech clear   MDM: MSE completed and orders placed as needed.    Discussed with the patient that exiting the department prior to completion of the work-up is AMA and there is no guarantee that there are no emergency medical conditions present. Pt states understanding.    The patient appears stable so that the remainder of the MSE may be completed by another provider.   Fatima Blank, MD 02/02/22 [phone]

## 2022-02-02 NOTE — ED Notes (Signed)
Patient c/o increasing "nervousness."  Requesting "something to help."

## 2022-02-02 NOTE — ED Triage Notes (Signed)
Patient coming to ED for evaluation of shortness of breath x 1 week.  States hx of asthma.  Has noticed palpations and increased HR intermittently with SHOB.  No reports of fevers.  States chest pain "only when I move."

## 2022-02-02 NOTE — ED Provider Notes (Signed)
[facility] DEPT Provider Note   CSN: [id number] Arrival date & time: 02/02/22  0012     History  Chief Complaint  Patient presents with   Shortness of Breath   Palpitations    [name] is a 37 y.o. female.   Shortness of Breath Palpitations Associated symptoms: shortness of breath      Patient with medical history of asthma, anxiety, allergy, bipolar disorder, borderline personality disorder, former cigarette dependence presents to the emergency department due to shortness of breath.  Started 2 days ago, associate with bodyaches, productive cough, palpitations.  Feels like her heart is racing and has been tachycardic in the 130s at home.  She does have a history of asthma, she is related wheezing requiring her inhaler more than normal.  On OCPs.   Home Medications Prior to Admission medications   Medication Sig Start Date End Date Taking? Authorizing Provider  amoxicillin-clavulanate (AUGMENTIN) 875-125 MG tablet Take 1 tablet by mouth every 12 (twelve) hours. 02/02/22  Yes Sherrill Raring, PA-C  doxycycline (VIBRAMYCIN) 100 MG capsule Take 1 capsule (100 mg total) by mouth 2 (two) times daily. 02/02/22  Yes Sherrill Raring, PA-C  albuterol (PROVENTIL) (2.5 MG/3ML) 0.083% nebulizer solution Take 3 mLs (2.5 mg total) by nebulization every 6 (six) hours as needed for wheezing or shortness of breath. 05/10/20   Valentina Shaggy, MD  albuterol (VENTOLIN HFA) 108 (90 Base) MCG/ACT inhaler Inhale 2 puffs into the lungs every 6 (six) hours as needed for wheezing or shortness of breath. 03/11/21   Valentina Shaggy, MD  budesonide-formoterol [facility]) 160-4.5 MCG/ACT inhaler Inhale 2 puffs into the lungs in the morning and at bedtime. 03/11/21 11/14/21  Valentina Shaggy, MD  calcium carbonate (OS-CAL - DOSED IN MG OF ELEMENTAL CALCIUM) 1250 (500 Ca) MG tablet Take 1 tablet by mouth daily with breakfast.    [provider]  Ferrous Sulfate (IRON  PO) Take 1 tablet by mouth daily.    [provider]  fluticasone (FLONASE) 50 MCG/ACT nasal spray Place 2 sprays into both nostrils daily. Patient taking differently: Place 2 sprays into both nostrils at bedtime. 03/11/21   Valentina Shaggy, MD  hydrOXYzine (VISTARIL) 50 MG capsule Take 1 capsule (50 mg total) by mouth daily as needed. 01/20/22   Merian Capron, MD  levonorgestrel-ethinyl estradiol (NORDETTE) 0.15-30 MG-MCG tablet Take 1 tablet by mouth at bedtime.    [provider]  montelukast (SINGULAIR) 10 MG tablet TAKE ONE TABLET BY MOUTH AT BEDTIME **NEED OFFICE VISIT** 01/29/22   Valentina Shaggy, MD  Multiple Vitamin (MULTIVITAMIN WITH MINERALS) TABS tablet Take 1 tablet by mouth daily. 12/25/20   Armando Reichert, MD  neomycin-bacitracin-polymyxin (NEOSPORIN) OINT Apply 1 Application topically 2 (two) times daily. 11/17/21   Massengill, Ovid Curd, MD  OLANZapine zydis (ZYPREXA ZYDIS) 5 MG disintegrating tablet Take 1 tablet (5 mg total) by mouth 2 (two) times daily as needed. 01/20/22   Merian Capron, MD  omeprazole (PRILOSEC) 40 MG capsule Take 1 capsule (40 mg total) by mouth 2 (two) times daily. 03/11/21   Valentina Shaggy, MD  Oxcarbazepine (TRILEPTAL) 300 MG tablet Take 1 tablet (300 mg total) by mouth 2 (two) times daily. 01/20/22   Merian Capron, MD  paliperidone (INVEGA) 6 MG 24 hr tablet Take 1 tablet (6 mg total) by mouth at bedtime. 01/20/22 03/21/22  Merian Capron, MD  Potassium (POTASSIMIN PO)  01/15/22   [provider]  prazosin (MINIPRESS) 1 MG capsule  Take 1 capsule (1 mg total) by mouth daily. 01/20/22   Merian Capron, MD  prazosin (MINIPRESS) 1 MG capsule Take 3 capsules (3 mg total) by mouth at bedtime. 01/20/22   Merian Capron, MD  THIAMINE HCL PO  01/15/22   [provider]  traZODone (DESYREL) 50 MG tablet Take 1 tablet (50 mg total) by mouth at bedtime as needed for sleep. 01/20/22   Merian Capron, MD  triamcinolone  cream (KENALOG) 0.1 % Apply 1 Application topically 2 (two) times daily. 12/23/21 12/23/22  Biagio Borg, MD      Allergies    Benztropine, Other, and Lamictal [lamotrigine]    Review of Systems   Review of Systems  Respiratory:  Positive for shortness of breath.   Cardiovascular:  Positive for palpitations.    Physical Exam Updated Vital Signs BP 105/79   Pulse 87   Temp 97.8 F (36.6 C) (Oral)   Resp 18   Ht '5\' 2"'$  (1.575 m)   Wt 100.7 kg   LMP 01/13/2022 (Approximate)   SpO2 95%   BMI 40.60 kg/m  Physical Exam Vitals and nursing note reviewed. Exam conducted with a chaperone present.  Constitutional:      Appearance: Normal appearance. She is obese.  HENT:     Head: Normocephalic and atraumatic.  Eyes:     General: No scleral icterus.       Right eye: No discharge.        Left eye: No discharge.     Extraocular Movements: Extraocular movements intact.     Pupils: Pupils are equal, round, and reactive to light.  Cardiovascular:     Rate and Rhythm: Regular rhythm. Tachycardia present.     Pulses: Normal pulses.     Heart sounds: Normal heart sounds. No murmur heard.    No friction rub. No gallop.  Pulmonary:     Effort: Pulmonary effort is normal. No respiratory distress.     Breath sounds: Wheezing present.  Abdominal:     General: Abdomen is flat. Bowel sounds are normal. There is no distension.     Palpations: Abdomen is soft.     Tenderness: There is no abdominal tenderness.  Musculoskeletal:     Right lower leg: No edema.     Left lower leg: No edema.  Skin:    General: Skin is warm and dry.     Coloration: Skin is not jaundiced.  Neurological:     Mental Status: She is alert. Mental status is at baseline.     Coordination: Coordination normal.     ED Results / Procedures / Treatments   Labs (all labs ordered are listed, but only abnormal results are displayed) Labs Reviewed  BASIC METABOLIC PANEL - Abnormal; Notable for the following  components:      Result Value   CO2 21 (*)    Glucose, Bld 101 (*)    Calcium 8.7 (*)    All other components within normal limits  D-DIMER, QUANTITATIVE - Abnormal; Notable for the following components:   D-Dimer, Quant 0.85 (*)    All other components within normal limits  RESP PANEL BY RT-PCR (RSV, FLU A&B, COVID)  RVPGX2  CBC WITH DIFFERENTIAL/PLATELET  MAGNESIUM  HCG, QUANTITATIVE, PREGNANCY    EKG EKG Interpretation  Date/Time:  Monday February 02 2022 00:31:25 EST Ventricular Rate:  126 PR Interval:  93 QRS Duration: 71 QT Interval:  403 QTC Calculation: 584 R Axis:   83 Text Interpretation: Sinus tachycardia Borderline  T abnormalities, diffuse leads Prolonged QT interval Otherwise no significant change Confirmed by Addison Lank [phone]) on 02/02/2022 1:28:03 AM  Radiology CT Angio Chest PE W and/or Wo Contrast  Result Date: 02/02/2022 CLINICAL DATA:  38 year old female with clinical concern for pulmonary embolism. EXAM: CT ANGIOGRAPHY CHEST WITH CONTRAST TECHNIQUE: Multidetector CT imaging of the chest was performed using the standard protocol during bolus administration of intravenous contrast. Multiplanar CT image reconstructions and MIPs were obtained to evaluate the vascular anatomy. RADIATION DOSE REDUCTION: This exam was performed according to the departmental dose-optimization program which includes automated exposure control, adjustment of the mA and/or kV according to patient size and/or use of iterative reconstruction technique. CONTRAST:  Eighty mL Omnipaque 350, intravenous COMPARISON:  10/13/2016 FINDINGS: Cardiovascular: Satisfactory opacification of the pulmonary arteries to the segmental level. No evidence of pulmonary embolism. Normal heart size. No pericardial effusion. Mediastinum/Nodes: No enlarged mediastinal, hilar, or axillary lymph nodes. Thyroid gland, trachea, and esophagus demonstrate no significant findings. Lungs/Pleura: Diffuse central bronchial  thickening. Wedge like ground-glass and consolidative opacity in the anterior, subpleural left upper lobe (series 6, image 38). There are few additional scattered focal ground-glass opacities, for example in the left upper lobe (series 6, image 51). No suspicious pulmonary nodules. No pleural effusion or pneumothorax. Upper Abdomen: The visualized upper abdomen is within normal limits. Musculoskeletal: No chest wall abnormality. No acute or significant osseous findings. Review of the MIP images confirms the above findings. IMPRESSION: Vascular: No evidence of pulmonary embolism. Non-Vascular: 1. Wedge like ground-glass and consolidative opacity in the anterior, subpleural left upper lobe with a few additional scattered focal ground-glass opacities. These findings are most compatible with multifocal pneumonia. 2. Diffuse central bronchial thickening in keeping with history of asthma. Ruthann Cancer, MD [facility] Radiology Electronically Signed   By: Ruthann Cancer M.D.   On: 02/02/2022 07:05   DG Chest 2 View  Result Date: 02/02/2022 CLINICAL DATA:  Shortness of breath EXAM: CHEST - 2 VIEW COMPARISON:  11/12/2021 FINDINGS: The heart size and mediastinal contours are within normal limits. Both lungs are clear. The visualized skeletal structures are unremarkable. IMPRESSION: No active cardiopulmonary disease. Electronically Signed   By: Inez Catalina M.D.   On: 02/02/2022 01:39    Procedures Procedures    Medications Ordered in ED Medications  sodium chloride (PF) 0.9 % injection (  Not Given 02/02/22 0748)  hydrOXYzine (ATARAX) tablet 25 mg (25 mg Oral Given 02/02/22 0604)  iohexol (OMNIPAQUE) 350 MG/ML injection 80 mL (80 mLs Intravenous Contrast Given 02/02/22 0641)  sodium chloride 0.9 % bolus 1,000 mL (0 mLs Intravenous Stopped 02/02/22 0920)  ipratropium-albuterol (DUONEB) 0.5-2.5 (3) MG/3ML nebulizer solution 3 mL (3 mLs Nebulization Given 02/02/22 0748)   methylPREDNISolone sodium succinate (SOLU-MEDROL) 125 mg/2 mL injection 125 mg (125 mg Intravenous Given 02/02/22 0748)    ED Course/ Medical Decision Making/ A&P Clinical Course as of 02/02/22 1247  Mon Feb 02, 2022  0556 Per charge RN, patient anxious about CT scan; asking for something for her nerves. Atarax ordered. [KH]    Clinical Course User Index [KH] Antonietta Breach, PA-C                           Medical Decision Making Amount and/or Complexity of Data Reviewed Labs: ordered. Radiology: ordered.  Risk Prescription drug management.   Patient resents with shortness of breath.  Differential includes not limited to asthma exacerbation, metabolic derangement, electrolyte  deficiency, PE, arrhythmia, pneumonia, pneumothorax.  Will check labs, EKG and chest x-ray.  I ordered, viewed and interpreted laboratory workup.  Per my interpretation: CBC without cytosis or anemia. BMP without gross electrolyte derangement or AKI.  Calcium mildly low at 8.7. COVID and flu is negative. Magnesium within normal limits. D-dimer ordered in triage is slightly elevated at 0.85.  Will proceed with CTA PE study.  I ordered, viewed and interpreted imaging studies.  Agree with radiologist. CXR unremarkable CTA PE study negative PE, notable for multifocal PNA.  EKG shows sinus tachycardia with borderline QT prolongation, diffuse T wave abnormalities any specific ischemic changes.  Patient on cardiac monitoring in sinus tachycardia from interpretation.  Patient is a multifocal pneumonia.  Her heart rate improved after fluids, Atarax and breathing treatment.  Her lung sounds also improved.  Her curb 65 score is 0, low risk for outpatient trial of antibiotics.  Considered hospitalization medication such as making feel patient is appropriate for close outpatient follow-up and oral antibiotic trial in the outpatient setting.  Will discharge with doxycycline given borderline QT prolongation do not want to  risk acute evaluation/torsades..  Return precautions discussed with the patient who verbalized understanding and agreement with the plan.  DC VS - BP 105/79   Pulse 87   Temp 97.8 F (36.6 C) (Oral)   Resp 18   Ht '5\' 2"'$  (1.575 m)   Wt 100.7 kg   LMP 01/13/2022 (Approximate)   SpO2 95%   BMI 40.60 kg/m         Final Clinical Impression(s) / ED Diagnoses Final diagnoses:  Multifocal pneumonia    Rx / DC Orders ED Discharge Orders          Ordered    amoxicillin-clavulanate (AUGMENTIN) 875-125 MG tablet  Every 12 hours        02/02/22 0801    doxycycline (VIBRAMYCIN) 100 MG capsule  2 times daily        02/02/22 0801              Sherrill Raring, PA-C 02/02/22 1247    Valarie Merino, MD 02/02/22 1331

## 2022-02-04 ENCOUNTER — Telehealth: Payer: Self-pay

## 2022-02-04 NOTE — Telephone Encounter (Signed)
Transition Care Management Unsuccessful Follow-up Telephone Call  Date of discharge and from where:  02/02/22 '@[place]'$  [facility]-EMERGENCY DEPT    Attempts:  1st Attempt  Reason for unsuccessful TCM follow-up call:  Left voice message

## 2022-02-05 ENCOUNTER — Telehealth: Payer: Self-pay

## 2022-02-05 ENCOUNTER — Encounter: Payer: Self-pay | Admitting: Allergy & Immunology

## 2022-02-05 ENCOUNTER — Ambulatory Visit (INDEPENDENT_AMBULATORY_CARE_PROVIDER_SITE_OTHER): Payer: Medicaid Other | Admitting: Allergy & Immunology

## 2022-02-05 ENCOUNTER — Other Ambulatory Visit: Payer: Self-pay

## 2022-02-05 VITALS — BP 90/62 | HR 110 | Temp 97.7°F | Resp 16 | Ht 62.0 in | Wt 229.3 lb

## 2022-02-05 DIAGNOSIS — J3089 Other allergic rhinitis: Secondary | ICD-10-CM

## 2022-02-05 DIAGNOSIS — J4540 Moderate persistent asthma, uncomplicated: Secondary | ICD-10-CM

## 2022-02-05 DIAGNOSIS — J302 Other seasonal allergic rhinitis: Secondary | ICD-10-CM

## 2022-02-05 DIAGNOSIS — R490 Dysphonia: Secondary | ICD-10-CM

## 2022-02-05 DIAGNOSIS — B999 Unspecified infectious disease: Secondary | ICD-10-CM

## 2022-02-05 MED ORDER — ALBUTEROL SULFATE (2.5 MG/3ML) 0.083% IN NEBU: 2.5000 mg | INHALATION_SOLUTION | Freq: Four times a day (QID) | RESPIRATORY_TRACT | 1 refills | Status: DC | PRN

## 2022-02-05 MED ORDER — ALBUTEROL SULFATE HFA 108 (90 BASE) MCG/ACT IN AERS: 2.0000 | INHALATION_SPRAY | Freq: Four times a day (QID) | RESPIRATORY_TRACT | 2 refills | Status: DC | PRN

## 2022-02-05 MED ORDER — PREDNISONE 10 MG PO TABS: ORAL_TABLET | ORAL | 0 refills | Status: DC

## 2022-02-05 MED ORDER — OMEPRAZOLE 40 MG PO CPDR: 40.0000 mg | DELAYED_RELEASE_CAPSULE | Freq: Two times a day (BID) | ORAL | 5 refills | Status: DC

## 2022-02-05 MED ORDER — FLUTICASONE PROPIONATE 50 MCG/ACT NA SUSP: 2.0000 | Freq: Every day | NASAL | 5 refills | Status: DC

## 2022-02-05 MED ORDER — BUDESONIDE-FORMOTEROL FUMARATE 160-4.5 MCG/ACT IN AERO: 2.0000 | INHALATION_SPRAY | Freq: Two times a day (BID) | RESPIRATORY_TRACT | 5 refills | Status: DC

## 2022-02-05 NOTE — Patient Instructions (Addendum)
1. Moderate persistent asthma, uncomplicated - Lung testing looked good today. - We are doing to get the [place] approved for your asthma. - Tammy (who works out of our [employer]) is going to call you with details. - You will get your first injection here and then you can get the others at home. - Daily controller medication(s): Singulair '10mg'$  daily and Symbicort 160/4.44mg two puffs twice daily with spacer - Prior to physical activity: albuterol 2 puffs 10-15 minutes before physical activity. - Rescue medications: albuterol 4 puffs every 4-6 hours as needed - Asthma control goals:  * Full participation in all desired activities (may need albuterol before activity) * Albuterol use two time or less a week on average (not counting use with activity) * Cough interfering with sleep two time or less a month * Oral steroids no more than once a year * No hospitalizations  2. Seasonal and perennial allergic rhinitis (dust mites, cats, dogs, cockroach, trees, weeds, grass, mouse, and horse) - The Dupixent might help with this.  - Continue with: hydroxyzine one tablet twice daily, Singulair (montelukast) '10mg'$  daily, Flonase (fluticasone) two sprays per nostril daily - You can use an extra dose of the antihistamine, if needed, for breakthrough symptoms.  - Continue with nasal saline rinses as needed.    3. Eczema - Continue with the hydrocortisone as needed.   4. Return in about 6 months (around 08/06/2022).    Please inform [place] any Emergency Department visits, hospitalizations, or changes in symptoms. Call [place] going to the ED for breathing or allergy symptoms since we might be able to fit you in for a sick visit. Feel free to contact [place] with any questions, problems, or concerns.  It was a pleasure to see you again today!  Websites that have reliable patient information: 1. American Academy of Asthma, Allergy, and Immunology: www.aaaai.org 2. Food Allergy Research and  Education (FARE): foodallergy.org 3. Mothers of Asthmatics: http://www.asthmacommunitynetwork.org 4. American College of Allergy, Asthma, and Immunology: www.acaai.org   COVID-19 Vaccine Information can be found at: [email or web address] questions related to vaccine distribution or appointments, please email [email or web address] or call [phone]     "Like" [facility] Facebook and Instagram for our latest updates!       Make sure you are registered to vote! If you have moved or changed any of your contact information, you will need to get this updated before voting!  In some cases, you MAY be able to register to vote online: [email or web address]

## 2022-02-05 NOTE — Telephone Encounter (Signed)
Transition Care Management Unsuccessful Follow-up Telephone Call  Date of discharge and from where:  02/02/22- [facility]  Attempts:  2nd Attempt  Reason for unsuccessful TCM follow-up call:  Left voice message

## 2022-02-05 NOTE — Telephone Encounter (Signed)
Transition Care Management Unsuccessful Follow-up Telephone Call  Date of discharge and from where:   02/02/22 '@[place]'$  [facility]-EMERGENCY DEPT   Attempts:  2nd Attempt  Reason for unsuccessful TCM follow-up call:  Left voice message

## 2022-02-05 NOTE — Progress Notes (Addendum)
FOLLOW UP  Date of Service/Encounter:  02/05/22   Assessment:   Moderate persistent asthma without complication - off medication for nearly one year   AEC 200 in January 2023   Seasonal and perennial allergic rhinitis (dust mites, cats, dogs, cockroach, trees, weeds, grass, mouse, and horse) - was doing better on allergy shots (but transportation is an issue right now)   Boderline personality disorder - with worsening status since breaking up with her significant other of 13 years  Recurrent infections - getting immune workup today  Plan/Recommendations:   1. Moderate persistent asthma, uncomplicated - Lung testing looked good today. - We are doing to get the [place] approved for your asthma. - Tammy (who works out of our [employer]) is going to call you with details. - You will get your first injection here and then you can get the others at home. - Daily controller medication(s): Singulair '10mg'$  daily and Symbicort 160/4.55mg two puffs twice daily with spacer - Prior to physical activity: albuterol 2 puffs 10-15 minutes before physical activity. - Rescue medications: albuterol 4 puffs every 4-6 hours as needed - Asthma control goals:  * Full participation in all desired activities (may need albuterol before activity) * Albuterol use two time or less a week on average (not counting use with activity) * Cough interfering with sleep two time or less a month * Oral steroids no more than once a year * No hospitalizations  2. Seasonal and perennial allergic rhinitis (dust mites, cats, dogs, cockroach, trees, weeds, grass, mouse, and horse) - The Dupixent might help with this.  - Continue with: hydroxyzine one tablet twice daily, Singulair (montelukast) '10mg'$  daily, Flonase (fluticasone) two sprays per nostril daily - You can use an extra dose of the antihistamine, if needed, for breakthrough symptoms.  - Continue with nasal saline rinses as needed.    3. Eczema - Continue  with the hydrocortisone as needed.   4. Recurrent infections - We will obtain some screening labs to evaluate your immune system.  - Labs to evaluate the quantitative ([facility] aspects of your immune system: IgG/IgA/IgM, CBC with differential - Labs to evaluate the qualitative ([place] aspects of your immune system: CH50, Pneumococcal titers, Tetanus titers, Diphtheria titers - We may consider immunizations with Pneumovax and Tdap to challenge your immune system, and then obtain repeat titers in 4-6 weeks.   5. Return in about 6 months (around 08/06/2022).   Subjective:   Beverly GUNNERis a 37y.o. female presenting today for follow up of  Chief Complaint  Patient presents with   Hospitalization Follow-up   Medication Refill    Beverly HOLOHANhas a history of the following: Patient Active Problem List   Diagnosis Date Noted   Abscess of external cheek, left 01/01/2022   Rash 12/25/2021   Asthma exacerbation 11/26/2021   STD exposure 11/26/2021   Insect bite 11/26/2021   Bipolar affective disorder ([facility] 11/13/2021   Encounter for well adult exam with abnormal findings 11/12/2021   Cellulitis 11/12/2021   Vaginal discharge 08/26/2021   BRBPR (bright red blood per rectum) 02/27/2021   Anal fissure 02/27/2021   Facial abscess 027/08/8673  Folliculitis 144/92/0100  Bipolar 2 disorder, major depressive episode ([facility] 12/20/2020   Vitamin D deficiency 07/15/2020   B12 deficiency 07/15/2020   MDD (major depressive disorder), recurrent severe, without psychosis ([facility] 04/26/2020   PTSD (post-traumatic stress disorder) 01/03/2020   Allergic asthma 01/13/2018   Allergic rhinitis  03/12/2017   Class 2 obesity due to excess calories without serious comorbidity with body mass index (BMI) of 37.0 to 37.9 in adult 04/17/2016   Acute asthmatic bronchitis 03/13/2016   Borderline personality disorder in adult [facility] [address]) 03/13/2016   Mood disorder ([place]) 03/04/2013    History  obtained from: chart review and patient.  [name] is a 37 y.o. female presenting for a follow up visit.  We last saw her in February 2023.  At that time, we continue with omeprazole 40 mg twice daily and put her back on Symbicort 160 mcg 2 puffs twice daily.  We continue with Singulair.  We talked about Dupixent for long-term control.  For her allergic rhinitis, we continue with hydroxyzine as well as Singulair and Flonase.  Her eczema was controlled with hydrocortisone.  She was recently seen in the ED on January 1st, 2024.  At that time, a CBC was normal.  A metabolic panel was normal as well.  COVID and flu testing was negative.  A D-dimer was slightly elevated and she had a CTA which was negative for pulmonary embolism but positive for multifocal pneumonia.  She was discharged on doxycycline and Augmentin. She was not prescribed prednisone taper at all.   In the interim, she has had a problem getting to appointments and whatnot.  She is on the verge of being homeless again as well.  She is living with a friend who has given her to the end of the month to move out.  Asthma/Respiratory Symptom History: She has not been on Symbicort in quite some time.  She has been taking her Symbicort. She has been taking it intermittently. She had a backlog of it and she has been doing it. She has been off and on steroids for months.She has had pneumonia for "over one month".  She has not been on prednisone.  She has not been to the emergency room at all.  Her absolute eosinophil count was 200 around 1 week ago.  Allergic Rhinitis Symptom History: She remains on hydroxyzine as well as Singulair and Flonase.  She does need refills of these.  She has not been on antibiotics at all.  She is interested in starting the "allergy shots".  Upon further discussion, it seems that she needs Dupixent.  She would qualify for it based on her asthma and her absolute eosinophil count.  Skin Symptom History: Her skin is controlled with  hydrocortisone as needed.  She has not had any flares.  She does moisturize.  She now has to look for a new place. She is on disability and she cannot qualify for a lease anywhere at all.  She has family in town, including her mother, but her stepfather apparently is a right Brewing technologist fasciitis.  He collects [employer] adviser.  Therefore, she does not want to live there at all.  Otherwise, there have been no changes to her past medical history, surgical history, family history, or social history.    Review of Systems  Constitutional: Negative.  Negative for chills, fever, malaise/fatigue and weight loss.  HENT:  Positive for congestion. Negative for ear discharge and ear pain.        Positive for postnasal drip.   Eyes:  Negative for pain, discharge and redness.  Respiratory:  Negative for cough, sputum production, shortness of breath and wheezing.   Cardiovascular: Negative.  Negative for chest pain and palpitations.  Gastrointestinal:  Negative for abdominal pain, constipation, diarrhea, heartburn, nausea and vomiting.  Skin: Negative.  Negative for itching and rash.  Neurological:  Negative for dizziness and headaches.  Endo/Heme/Allergies:  Negative for environmental allergies. Does not bruise/bleed easily.       Objective:   Blood pressure 90/62, pulse (!) 110, temperature 97.7 F (36.5 C), temperature source Temporal, resp. rate 16, height '5\' 2"'$  (1.575 m), weight 229 lb 4.8 oz (104 kg), last menstrual period 01/13/2022, SpO2 97 %. Body mass index is 41.94 kg/m.    Physical Exam Vitals reviewed.  Constitutional:      Appearance: She is well-developed. She is obese.     Comments: More interactive today. She does respond to questions.   HENT:     Head: Normocephalic and atraumatic.     Comments: Multiple piercings.    Right Ear: Tympanic membrane, ear canal and external ear normal.     Left Ear: Tympanic membrane, ear canal and external ear normal.     Nose: No  nasal deformity, septal deviation, mucosal edema or rhinorrhea.     Right Turbinates: Enlarged, swollen and pale.     Left Turbinates: Enlarged, swollen and pale.     Right Sinus: No maxillary sinus tenderness or frontal sinus tenderness.     Left Sinus: No maxillary sinus tenderness or frontal sinus tenderness.     Comments: Cobblestoning present in the posterior oropharynx.    Mouth/Throat:     Mouth: Mucous membranes are not pale and not dry.     Pharynx: Uvula midline.  Eyes:     General: Allergic shiner present.        Right eye: No discharge.        Left eye: No discharge.     Conjunctiva/sclera: Conjunctivae normal.     Right eye: Right conjunctiva is not injected. No chemosis.    Left eye: Left conjunctiva is not injected. No chemosis.    Pupils: Pupils are equal, round, and reactive to light.  Cardiovascular:     Rate and Rhythm: Normal rate and regular rhythm.     Heart sounds: Normal heart sounds.  Pulmonary:     Effort: Pulmonary effort is normal. No tachypnea, accessory muscle usage or respiratory distress.     Breath sounds: Wheezing present. No rhonchi or rales.     Comments: Moving air well in all lung fields.  There is some expiratory wheezing noted especially at the bases.  No tachypnea.  No increased work of breathing. She does have some lesions on her upper chest that have scarred over.  Chest:     Chest wall: No tenderness.  Lymphadenopathy:     Cervical: No cervical adenopathy.  Skin:    General: Skin is warm.     Capillary Refill: Capillary refill takes less than 2 seconds.     Coloration: Skin is not pale.     Findings: No abrasion, erythema, petechiae or rash. Rash is not papular, urticarial or vesicular.     Comments: Multiple lesions from picking her skin.   Neurological:     Mental Status: She is alert.  Psychiatric:        Behavior: Behavior is cooperative.      Diagnostic studies:    Spirometry: results normal (FEV1: 2.15/74%, FVC: 2.64/76%,  FEV1/FVC: 81%).    Spirometry consistent with possible restrictive disease.    Allergy Studies: none        Salvatore Marvel, MD  [facility]

## 2022-02-05 NOTE — Telephone Encounter (Signed)
3rd attempt

## 2022-02-06 ENCOUNTER — Encounter: Payer: Self-pay | Admitting: Internal Medicine

## 2022-02-06 NOTE — Telephone Encounter (Signed)
Letter sent via my chart

## 2022-02-09 ENCOUNTER — Encounter: Payer: Self-pay | Admitting: Allergy & Immunology

## 2022-02-10 ENCOUNTER — Encounter: Payer: Self-pay | Admitting: Nurse Practitioner

## 2022-02-10 ENCOUNTER — Ambulatory Visit: Payer: Medicaid Other | Admitting: Nurse Practitioner

## 2022-02-10 VITALS — BP 108/82 | HR 120

## 2022-02-10 DIAGNOSIS — B3731 Acute candidiasis of vulva and vagina: Secondary | ICD-10-CM | POA: Diagnosis not present

## 2022-02-10 DIAGNOSIS — Z3041 Encounter for surveillance of contraceptive pills: Secondary | ICD-10-CM | POA: Diagnosis not present

## 2022-02-10 DIAGNOSIS — N898 Other specified noninflammatory disorders of vagina: Secondary | ICD-10-CM | POA: Diagnosis not present

## 2022-02-10 DIAGNOSIS — Z01812 Encounter for preprocedural laboratory examination: Secondary | ICD-10-CM

## 2022-02-10 LAB — WET PREP FOR TRICH, YEAST, CLUE

## 2022-02-10 MED ORDER — FLUCONAZOLE 150 MG PO TABS: 150.0000 mg | ORAL_TABLET | ORAL | 0 refills | Status: DC

## 2022-02-10 MED ORDER — LEVONORGESTREL-ETHINYL ESTRAD 0.15-30 MG-MCG PO TABS: 1.0000 | ORAL_TABLET | Freq: Every day | ORAL | 1 refills | Status: DC

## 2022-02-10 NOTE — Progress Notes (Signed)
   Acute Office Visit  Subjective:    [name], female    DOB: 06-14-1985, 37 y.o.   MRN: [id number]   HPI 37 y.o. presents today for vaginal discharge, external itching/irritation and odor. Currently on antibiotics for pneumonia. Has 3 days left of course. Treated for BV 01/20/2022, symptoms fully resolved. We had also discussed changing COCs to Nexplanon but she wants to continue COCs for now and needs refills.    Review of Systems  Constitutional: Negative.   Genitourinary:  Positive for vaginal discharge and vaginal pain (external itching/irritation).       Vaginal odor       Objective:    Physical Exam Constitutional:      Appearance: Normal appearance.  Genitourinary:    Vagina: Normal.     Cervix: Normal.       BP 108/82   Pulse (!) 120   LMP 01/13/2022 (Approximate)   SpO2 94%  Wt Readings from Last 3 Encounters:  02/05/22 229 lb 4.8 oz (104 kg)  02/02/22 222 lb (100.7 kg)  01/20/22 222 lb (100.7 kg)        Patient informed chaperone available to be present for breast and/or pelvic exam. Patient has requested no chaperone to be present. Patient has been advised what will be completed during breast and pelvic exam.   Wet prep negative for pathogens  Assessment & Plan:   Problem List Items Addressed This Visit       Other   Vaginal discharge   Relevant Orders   WET PREP FOR [place], YEAST, CLUE   Other Visit Diagnoses     Vulvar candidiasis    -  Primary   Relevant Medications   fluconazole (DIFLUCAN) 150 MG tablet   Encounter for surveillance of contraceptive pills       Relevant Medications   levonorgestrel-ethinyl estradiol (NORDETTE) 0.15-30 MG-MCG tablet      Plan: Wet prep negative. Exam shows redness along perineum and anus consistent with yeast. Diflucan 150 mg today and repeat in 3 days if symptoms persist for total of 2 doses. COC refills provided.      [name], 11:04 AM 02/10/2022

## 2022-02-12 ENCOUNTER — Telehealth: Payer: Self-pay | Admitting: *Deleted

## 2022-02-12 MED ORDER — DUPIXENT 300 MG/2ML ~~LOC~~ SOSY
600.0000 mg | PREFILLED_SYRINGE | SUBCUTANEOUS | 11 refills | Status: DC
  Filled 2022-02-12: qty 4, 28d supply, fill #0

## 2022-02-12 NOTE — Telephone Encounter (Signed)
-----   Message from Valentina Shaggy, MD sent at 02/09/2022  8:20 AM EST ----- Patient wants to start [place]. AEC is 200, but she has a history of having problems following up. She has transportation issues and she is on the verge of being homeless. She is fine with self administering, but I will be honest - seems like a tenuous situation.

## 2022-02-12 NOTE — Telephone Encounter (Signed)
Called patient and advised aprpoval and submit to [facility] for [place]. Instructed patient on delivery, storage, dosing and appt with initial start of therapy

## 2022-02-13 ENCOUNTER — Other Ambulatory Visit (HOSPITAL_COMMUNITY): Payer: Self-pay

## 2022-02-13 MED ORDER — DUPIXENT 300 MG/2ML ~~LOC~~ SOSY
600.0000 mg | PREFILLED_SYRINGE | Freq: Once | SUBCUTANEOUS | 11 refills | Status: AC
  Filled 2022-02-13: qty 4, 1d supply, fill #0
  Filled 2022-02-16: qty 8, 30d supply, fill #0

## 2022-02-13 NOTE — Addendum Note (Signed)
Addended by: Carin Hock on: 02/13/2022 10:09 AM   Modules accepted: Orders

## 2022-02-16 ENCOUNTER — Other Ambulatory Visit (HOSPITAL_COMMUNITY): Payer: Self-pay

## 2022-02-17 ENCOUNTER — Emergency Department (HOSPITAL_COMMUNITY)
Admission: EM | Admit: 2022-02-17 | Discharge: 2022-02-18 | Disposition: A | Discharge status: Home / Self Care | Payer: Medicaid Other | Source: Home / Self Care | Attending: Emergency Medicine | Admitting: Emergency Medicine

## 2022-02-17 ENCOUNTER — Other Ambulatory Visit: Payer: Self-pay

## 2022-02-17 ENCOUNTER — Emergency Department (HOSPITAL_COMMUNITY): Payer: Medicaid Other

## 2022-02-17 ENCOUNTER — Encounter (HOSPITAL_COMMUNITY): Payer: Self-pay

## 2022-02-17 DIAGNOSIS — F1910 Other psychoactive substance abuse, uncomplicated: Secondary | ICD-10-CM | POA: Insufficient documentation

## 2022-02-17 DIAGNOSIS — F419 Anxiety disorder, unspecified: Secondary | ICD-10-CM | POA: Insufficient documentation

## 2022-02-17 DIAGNOSIS — Y900 Blood alcohol level of less than 20 mg/100 ml: Secondary | ICD-10-CM | POA: Insufficient documentation

## 2022-02-17 DIAGNOSIS — R45851 Suicidal ideations: Secondary | ICD-10-CM | POA: Insufficient documentation

## 2022-02-17 DIAGNOSIS — J45909 Unspecified asthma, uncomplicated: Secondary | ICD-10-CM | POA: Insufficient documentation

## 2022-02-17 DIAGNOSIS — F1210 Cannabis abuse, uncomplicated: Secondary | ICD-10-CM | POA: Insufficient documentation

## 2022-02-17 DIAGNOSIS — Z20822 Contact with and (suspected) exposure to covid-19: Secondary | ICD-10-CM | POA: Insufficient documentation

## 2022-02-17 DIAGNOSIS — Z87891 Personal history of nicotine dependence: Secondary | ICD-10-CM | POA: Insufficient documentation

## 2022-02-17 DIAGNOSIS — F1410 Cocaine abuse, uncomplicated: Secondary | ICD-10-CM | POA: Insufficient documentation

## 2022-02-17 LAB — COMPREHENSIVE METABOLIC PANEL
ALT: 30 U/L (ref 0–44)
AST: 26 U/L (ref 15–41)
Albumin: 3.4 g/dL — ABNORMAL LOW (ref 3.5–5.0)
Alkaline Phosphatase: 64 U/L (ref 38–126)
Anion gap: 10 (ref 5–15)
BUN: 7 mg/dL (ref 6–20)
CO2: 24 mmol/L (ref 22–32)
Calcium: 8.7 mg/dL — ABNORMAL LOW (ref 8.9–10.3)
Chloride: 107 mmol/L (ref 98–111)
Creatinine, Ser: 0.91 mg/dL (ref 0.44–1.00)
GFR, Estimated: >60 mL/min (ref >60)
Glucose, Bld: 123 mg/dL — ABNORMAL HIGH (ref 70–99)
Potassium: 3.9 mmol/L (ref 3.5–5.1)
Sodium: 141 mmol/L (ref 135–145)
Total Bilirubin: 0.4 mg/dL (ref 0.3–1.2)
Total Protein: 6.6 g/dL (ref 6.5–8.1)

## 2022-02-17 LAB — CBC WITH DIFFERENTIAL/PLATELET
Abs Immature Granulocytes: 0.03 10*3/uL (ref 0.00–0.07)
Basophils Absolute: 0.1 10*3/uL (ref 0.0–0.1)
Basophils Relative: 1 %
Eosinophils Absolute: 0.2 10*3/uL (ref 0.0–0.5)
Eosinophils Relative: 2 %
HCT: 44.8 % (ref 36.0–46.0)
Hemoglobin: 14.4 g/dL (ref 12.0–15.0)
Immature Granulocytes: 0 %
Lymphocytes Relative: 28 %
Lymphs Abs: 2.6 10*3/uL (ref 0.7–4.0)
MCH: 30.2 pg (ref 26.0–34.0)
MCHC: 32.1 g/dL (ref 30.0–36.0)
MCV: 93.9 fL (ref 80.0–100.0)
Monocytes Absolute: 0.5 10*3/uL (ref 0.1–1.0)
Monocytes Relative: 6 %
Neutro Abs: 5.8 10*3/uL (ref 1.7–7.7)
Neutrophils Relative %: 63 %
Platelets: 272 10*3/uL (ref 150–400)
RBC: 4.77 MIL/uL (ref 3.87–5.11)
RDW: 11.8 % (ref 11.5–15.5)
WBC: 9.2 10*3/uL (ref 4.0–10.5)
nRBC: 0 % (ref 0.0–0.2)

## 2022-02-17 LAB — RAPID URINE DRUG SCREEN, HOSP PERFORMED
Amphetamines: NOT DETECTED
Barbiturates: NOT DETECTED
Benzodiazepines: NOT DETECTED
Cocaine: POSITIVE — AB
Opiates: NOT DETECTED
Tetrahydrocannabinol: POSITIVE — AB

## 2022-02-17 LAB — PREGNANCY, URINE: Preg Test, Ur: NEGATIVE

## 2022-02-17 LAB — ETHANOL: Alcohol, Ethyl (B): <10 mg/dL (ref <10)

## 2022-02-17 LAB — TROPONIN I (HIGH SENSITIVITY): Troponin I (High Sensitivity): 3 ng/L (ref <18)

## 2022-02-17 MED ORDER — PALIPERIDONE ER 6 MG PO TB24
6.0000 mg | ORAL_TABLET | Freq: Every day | ORAL | Status: DC
  Administered 2022-02-17: 6 mg via ORAL
  Filled 2022-02-17: qty 1

## 2022-02-17 MED ORDER — TRAZODONE HCL 50 MG PO TABS
50.0000 mg | ORAL_TABLET | Freq: Every evening | ORAL | Status: DC | PRN
  Administered 2022-02-17: 50 mg via ORAL
  Filled 2022-02-17: qty 1

## 2022-02-17 MED ORDER — ALBUTEROL SULFATE HFA 108 (90 BASE) MCG/ACT IN AERS: 2.0000 | INHALATION_SPRAY | Freq: Four times a day (QID) | RESPIRATORY_TRACT | Status: DC | PRN

## 2022-02-17 MED ORDER — OXCARBAZEPINE 300 MG PO TABS
300.0000 mg | ORAL_TABLET | Freq: Two times a day (BID) | ORAL | Status: DC
  Administered 2022-02-17 – 2022-02-18 (×2): 300 mg via ORAL
  Filled 2022-02-17 (×2): qty 1

## 2022-02-17 MED ORDER — OLANZAPINE 5 MG PO TBDP
5.0000 mg | ORAL_TABLET | Freq: Two times a day (BID) | ORAL | Status: DC | PRN
  Administered 2022-02-17: 5 mg via ORAL
  Filled 2022-02-17: qty 1

## 2022-02-17 MED ORDER — HYDROXYZINE PAMOATE 50 MG PO CAPS: 50.0000 mg | ORAL_CAPSULE | Freq: Every day | ORAL | Status: DC | PRN

## 2022-02-17 MED ORDER — FLUTICASONE PROPIONATE 50 MCG/ACT NA SUSP
2.0000 | Freq: Every day | NASAL | Status: DC
  Administered 2022-02-18: 2 via NASAL
  Filled 2022-02-17: qty 16

## 2022-02-17 MED ORDER — ALBUTEROL SULFATE (2.5 MG/3ML) 0.083% IN NEBU: 2.5000 mg | INHALATION_SOLUTION | Freq: Four times a day (QID) | RESPIRATORY_TRACT | Status: DC | PRN

## 2022-02-17 MED ORDER — HYDROXYZINE HCL 25 MG PO TABS
50.0000 mg | ORAL_TABLET | Freq: Every day | ORAL | Status: DC | PRN
  Administered 2022-02-18: 50 mg via ORAL
  Filled 2022-02-17: qty 2

## 2022-02-17 MED ORDER — PRAZOSIN HCL 1 MG PO CAPS
3.0000 mg | ORAL_CAPSULE | Freq: Every day | ORAL | Status: DC
  Administered 2022-02-17: 3 mg via ORAL
  Filled 2022-02-17: qty 3

## 2022-02-17 MED ORDER — PRAZOSIN HCL 1 MG PO CAPS
1.0000 mg | ORAL_CAPSULE | Freq: Every day | ORAL | Status: DC
  Administered 2022-02-18: 1 mg via ORAL
  Filled 2022-02-17: qty 1

## 2022-02-17 MED ORDER — MOMETASONE FURO-FORMOTEROL FUM 200-5 MCG/ACT IN AERO
2.0000 | INHALATION_SPRAY | Freq: Two times a day (BID) | RESPIRATORY_TRACT | Status: DC
  Administered 2022-02-18: 2 via RESPIRATORY_TRACT
  Filled 2022-02-17: qty 8.8

## 2022-02-17 MED ORDER — ADULT MULTIVITAMIN W/MINERALS CH
1.0000 | ORAL_TABLET | Freq: Every day | ORAL | Status: DC
  Administered 2022-02-18: 1 via ORAL
  Filled 2022-02-17: qty 1

## 2022-02-17 NOTE — ED Provider Notes (Signed)
[facility] DEPT Provider Note   CSN: [id number] Arrival date & time: 02/17/22  1841     History  Chief Complaint  Patient presents with   Suicidal   Drug / Alcohol Assessment    [name] is a 37 y.o. female.  37 year old female with history of cocaine addiction who presents with suicidal ideations.  Patient states that she had a period sobriety but relapsed recently after being diagnosed with pneumonia.  Has had a slight cough since then.  Denies any recent fever or chills.  No persistent alcohol use.  No intentional ingestions at this time.  Denies responding to internal stimuli.       Home Medications Prior to Admission medications   Medication Sig Start Date End Date Taking? Authorizing Provider  albuterol (PROVENTIL) (2.5 MG/3ML) 0.083% nebulizer solution Take 3 mLs (2.5 mg total) by nebulization every 6 (six) hours as needed for wheezing or shortness of breath. 02/05/22   Valentina Shaggy, MD  albuterol (VENTOLIN HFA) 108 (90 Base) MCG/ACT inhaler Inhale 2 puffs into the lungs every 6 (six) hours as needed for wheezing or shortness of breath. 02/05/22   Valentina Shaggy, MD  amoxicillin-clavulanate (AUGMENTIN) 875-125 MG tablet Take 1 tablet by mouth every 12 (twelve) hours. 02/02/22   Sherrill Raring, PA-C  budesonide-formoterol (SYMBICORT) 160-4.5 MCG/ACT inhaler Inhale 2 puffs into the lungs in the morning and at bedtime. 02/05/22 03/07/22  Valentina Shaggy, MD  calcium carbonate (OS-CAL - DOSED IN MG OF ELEMENTAL CALCIUM) 1250 (500 Ca) MG tablet Take 1 tablet by mouth daily with breakfast.    [provider]  doxycycline (VIBRAMYCIN) 100 MG capsule Take 1 capsule (100 mg total) by mouth 2 (two) times daily. 02/02/22   Sherrill Raring, PA-C  Ferrous Sulfate (IRON PO) Take 1 tablet by mouth daily.    [provider]  fluconazole (DIFLUCAN) 150 MG tablet Take 1 tablet (150 mg total) by mouth every 3 (three) days. 02/10/22    Tamela Gammon, NP  fluticasone (FLONASE) 50 MCG/ACT nasal spray Place 2 sprays into both nostrils daily. 02/05/22 03/07/22  Valentina Shaggy, MD  hydrOXYzine (VISTARIL) 50 MG capsule Take 1 capsule (50 mg total) by mouth daily as needed. 01/20/22   Merian Capron, MD  levonorgestrel-ethinyl estradiol (NORDETTE) 0.15-30 MG-MCG tablet Take 1 tablet by mouth at bedtime. 02/10/22   Tamela Gammon, NP  montelukast (SINGULAIR) 10 MG tablet TAKE ONE TABLET BY MOUTH AT BEDTIME **NEED OFFICE VISIT** 01/29/22   Valentina Shaggy, MD  Multiple Vitamin (MULTIVITAMIN WITH MINERALS) TABS tablet Take 1 tablet by mouth daily. 12/25/20   Armando Reichert, MD  OLANZapine zydis (ZYPREXA ZYDIS) 5 MG disintegrating tablet Take 1 tablet (5 mg total) by mouth 2 (two) times daily as needed. 01/20/22   Merian Capron, MD  omeprazole (PRILOSEC) 40 MG capsule Take 1 capsule (40 mg total) by mouth 2 (two) times daily. 02/05/22   Valentina Shaggy, MD  Oxcarbazepine (TRILEPTAL) 300 MG tablet Take 1 tablet (300 mg total) by mouth 2 (two) times daily. 01/20/22   Merian Capron, MD  paliperidone (INVEGA) 6 MG 24 hr tablet Take 1 tablet (6 mg total) by mouth at bedtime. 01/20/22 03/21/22  Merian Capron, MD  Potassium (POTASSIMIN PO)  01/15/22   [provider]  prazosin (MINIPRESS) 1 MG capsule Take 1 capsule (1 mg total) by mouth daily. 01/20/22   Merian Capron, MD  prazosin (MINIPRESS) 1 MG capsule Take 3 capsules (3  mg total) by mouth at bedtime. 01/20/22   Merian Capron, MD  predniSONE (DELTASONE) 10 MG tablet Take two tablets ('20mg'$ ) twice daily for three days, then one tablet ('10mg'$ ) twice daily for three days, then STOP. 02/05/22   Valentina Shaggy, MD  THIAMINE HCL PO  01/15/22   [provider]  traZODone (DESYREL) 50 MG tablet Take 1 tablet (50 mg total) by mouth at bedtime as needed for sleep. 01/20/22   Merian Capron, MD  triamcinolone cream (KENALOG) 0.1 % Apply 1 Application topically 2  (two) times daily. 12/23/21 12/23/22  Biagio Borg, MD      Allergies    Benztropine, Other, and Lamictal [lamotrigine]    Review of Systems   Review of Systems  All other systems reviewed and are negative.   Physical Exam Updated Vital Signs BP (!) 135/106   Pulse (!) 110   Temp 98.3 F (36.8 C) (Oral)   Resp (!) 22   Ht 1.575 m ('5\' 2"'$ )   Wt 104.3 kg   LMP 02/16/2022 (Approximate)   SpO2 100%   BMI 42.07 kg/m  Physical Exam Vitals and nursing note reviewed.  Constitutional:      General: She is not in acute distress.    Appearance: Normal appearance. She is well-developed. She is not toxic-appearing.  HENT:     Head: Normocephalic and atraumatic.  Eyes:     General: Lids are normal.     Conjunctiva/sclera: Conjunctivae normal.     Pupils: Pupils are equal, round, and reactive to light.  Neck:     Thyroid: No thyroid mass.     Trachea: No tracheal deviation.  Cardiovascular:     Rate and Rhythm: Normal rate and regular rhythm.     Heart sounds: Normal heart sounds. No murmur heard.    No gallop.  Pulmonary:     Effort: Pulmonary effort is normal. No respiratory distress.     Breath sounds: Normal breath sounds. No stridor. No decreased breath sounds, wheezing, rhonchi or rales.  Abdominal:     General: There is no distension.     Palpations: Abdomen is soft.     Tenderness: There is no abdominal tenderness. There is no rebound.  Musculoskeletal:        General: No tenderness. Normal range of motion.     Cervical back: Normal range of motion and neck supple.  Skin:    General: Skin is warm and dry.     Findings: No abrasion or rash.  Neurological:     General: No focal deficit present.     Mental Status: She is alert and oriented to person, place, and time. Mental status is at baseline.     GCS: GCS eye subscore is 4. GCS verbal subscore is 5. GCS motor subscore is 6.     Cranial Nerves: No cranial nerve deficit.     Sensory: No sensory deficit.     Motor:  Motor function is intact.  Psychiatric:        Attention and Perception: Attention normal.        Mood and Affect: Mood is depressed.        Speech: Speech normal.        Behavior: Behavior normal.        Thought Content: Thought content includes suicidal ideation. Thought content includes suicidal plan.     ED Results / Procedures / Treatments   Labs (all labs ordered are listed, but only abnormal results are displayed)  Labs Reviewed  RAPID URINE DRUG SCREEN, HOSP PERFORMED - Abnormal; Notable for the following components:      Result Value   Cocaine POSITIVE (*)    Tetrahydrocannabinol POSITIVE (*)    All other components within normal limits  ETHANOL  CBC WITH DIFFERENTIAL/PLATELET  PREGNANCY, URINE  COMPREHENSIVE METABOLIC PANEL  I-STAT BETA HCG BLOOD, ED ([facility], [facility], [facility] ONLY)  TROPONIN I (HIGH SENSITIVITY)  TROPONIN I (HIGH SENSITIVITY)    EKG EKG Interpretation  Date/Time:  Tuesday February 17 2022 21:33:11 EST Ventricular Rate:  109 PR Interval:  128 QRS Duration: 76 QT Interval:  315 QTC Calculation: 425 R Axis:   69 Text Interpretation: Sinus tachycardia Borderline T wave abnormalities Confirmed by Lacretia Leigh ([id number]) on 02/17/2022 9:48:33 PM  Radiology DG Chest 2 View  Result Date: 02/17/2022 CLINICAL DATA:  Chest pain following drug use, initial encounter EXAM: CHEST - 2 VIEW COMPARISON:  None Available. FINDINGS: The heart size and mediastinal contours are within normal limits. Both lungs are clear. The visualized skeletal structures are unremarkable. IMPRESSION: No active cardiopulmonary disease. Electronically Signed   By: Inez Catalina M.D.   On: 02/17/2022 20:59    Procedures Procedures    Medications Ordered in ED Medications  albuterol (PROVENTIL) (2.5 MG/3ML) 0.083% nebulizer solution 2.5 mg (has no administration in time range)  mometasone-formoterol (DULERA) 200-5 MCG/ACT inhaler 2 puff (2 puffs Inhalation Not Given 02/17/22 2214)  hydrOXYzine  (VISTARIL) capsule 50 mg (has no administration in time range)  fluticasone (FLONASE) 50 MCG/ACT nasal spray 2 spray (has no administration in time range)  OLANZapine zydis (ZYPREXA) disintegrating tablet 5 mg (has no administration in time range)  multivitamin with minerals tablet 1 tablet (has no administration in time range)  Oxcarbazepine (TRILEPTAL) tablet 300 mg (has no administration in time range)  paliperidone (INVEGA) 24 hr tablet 6 mg (has no administration in time range)  prazosin (MINIPRESS) capsule 1 mg (has no administration in time range)  prazosin (MINIPRESS) capsule 3 mg (has no administration in time range)  traZODone (DESYREL) tablet 50 mg (has no administration in time range)    ED Course/ Medical Decision Making/ A&P                             Medical Decision Making Risk Prescription drug management.  Patient complaining persistent cough.  Chest x-ray per interpretation showed no evidence of pneumonia.  Do not feel that she would benefit from other imaging.  She has no fever.  Suspect that her symptoms have resolved.  Her white count is normal.  No concern for systemic infection. Patient is UDS is positive for cocaine here.  Labs are reassuring here.  Patient medically cleared.  Will consult psychiatry.  Patient is currently voluntary        Final Clinical Impression(s) / ED Diagnoses Final diagnoses:  None    Rx / DC Orders ED Discharge Orders     None         Lacretia Leigh, MD 02/17/22 2225

## 2022-02-17 NOTE — ED Provider Triage Note (Signed)
Emergency Medicine Provider Triage Evaluation Note  [name] , a 37 y.o. female  was evaluated in triage.  Pt complains of SI. Notes that she relapsed on 02/02/2022.  Notes that she has been using IV cocaine and injecting to her bilateral antecubitals regions.  Her last use was at 5 PM.  Has associated chest pain, shortness of breath.  Notes that she was in the hospital 02/02/2022 due to pneumonia.  Denies nausea, vomiting, rhinorrhea, nasal congestion.  Review of Systems  Positive:  Negative:   Physical Exam  BP (!) 135/106   Pulse (!) 110   Temp 98.3 F (36.8 C) (Oral)   Resp (!) 22   LMP 01/13/2022 (Approximate)   SpO2 100%  Gen:   Awake, no distress   Resp:  Normal effort  MSK:   Moves extremities without difficulty  Other:  Able to ambulate without assistance or difficulty.  No chest wall tenderness to palpation.  Medical Decision Making  Medically screening exam initiated at 8:20 PM.  Appropriate orders placed.  [name] was informed that the remainder of the evaluation will be completed by another provider, this initial triage assessment does not replace that evaluation, and the importance of remaining in the ED until their evaluation is complete.  Pt here voluntary.   Fuquan Wilson A, PA-C 02/17/22 2046

## 2022-02-17 NOTE — ED Notes (Signed)
Pt ambulatory to xray without assistance.

## 2022-02-17 NOTE — ED Triage Notes (Signed)
Says she has relapsed with use of IV cocaine + tap water 15 days ago. Last use was several hours ago.   Sts being seen 02/02/22 and dx with PNA. Subjective worsening.   Swelling to left hand with puncture sites.   Suicidal ideation that intrusive per pt. Says no plan of acting on them but ideation of cutting self with "something sharp".

## 2022-02-17 NOTE — Telephone Encounter (Signed)
Thanks Tammy 

## 2022-02-18 ENCOUNTER — Encounter (HOSPITAL_COMMUNITY): Payer: Self-pay | Admitting: Psychiatry

## 2022-02-18 ENCOUNTER — Inpatient Hospital Stay (HOSPITAL_COMMUNITY)
Admission: AD | Admit: 2022-02-18 | Discharge: 2022-02-22 | DRG: 885 | Disposition: A | Discharge status: Home / Self Care | Payer: Medicaid Other | Source: Intra-hospital | Attending: Psychiatry | Admitting: Psychiatry

## 2022-02-18 DIAGNOSIS — J45909 Unspecified asthma, uncomplicated: Secondary | ICD-10-CM | POA: Diagnosis present

## 2022-02-18 DIAGNOSIS — F411 Generalized anxiety disorder: Secondary | ICD-10-CM | POA: Diagnosis present

## 2022-02-18 DIAGNOSIS — F17210 Nicotine dependence, cigarettes, uncomplicated: Secondary | ICD-10-CM | POA: Diagnosis present

## 2022-02-18 DIAGNOSIS — D649 Anemia, unspecified: Secondary | ICD-10-CM | POA: Diagnosis present

## 2022-02-18 DIAGNOSIS — I10 Essential (primary) hypertension: Secondary | ICD-10-CM | POA: Diagnosis present

## 2022-02-18 DIAGNOSIS — E611 Iron deficiency: Secondary | ICD-10-CM | POA: Diagnosis present

## 2022-02-18 DIAGNOSIS — R45851 Suicidal ideations: Secondary | ICD-10-CM

## 2022-02-18 DIAGNOSIS — Z23 Encounter for immunization: Secondary | ICD-10-CM

## 2022-02-18 DIAGNOSIS — Z825 Family history of asthma and other chronic lower respiratory diseases: Secondary | ICD-10-CM | POA: Diagnosis not present

## 2022-02-18 DIAGNOSIS — K30 Functional dyspepsia: Secondary | ICD-10-CM | POA: Diagnosis present

## 2022-02-18 DIAGNOSIS — F3174 Bipolar disorder, in full remission, most recent episode manic: Secondary | ICD-10-CM | POA: Diagnosis present

## 2022-02-18 DIAGNOSIS — F603 Borderline personality disorder: Secondary | ICD-10-CM | POA: Diagnosis present

## 2022-02-18 DIAGNOSIS — F4310 Post-traumatic stress disorder, unspecified: Secondary | ICD-10-CM | POA: Diagnosis present

## 2022-02-18 DIAGNOSIS — Z560 Unemployment, unspecified: Secondary | ICD-10-CM

## 2022-02-18 DIAGNOSIS — Z79899 Other long term (current) drug therapy: Secondary | ICD-10-CM | POA: Diagnosis not present

## 2022-02-18 DIAGNOSIS — Z20822 Contact with and (suspected) exposure to covid-19: Secondary | ICD-10-CM | POA: Diagnosis present

## 2022-02-18 DIAGNOSIS — F909 Attention-deficit hyperactivity disorder, unspecified type: Secondary | ICD-10-CM | POA: Diagnosis present

## 2022-02-18 DIAGNOSIS — K219 Gastro-esophageal reflux disease without esophagitis: Secondary | ICD-10-CM | POA: Diagnosis present

## 2022-02-18 DIAGNOSIS — F1910 Other psychoactive substance abuse, uncomplicated: Secondary | ICD-10-CM | POA: Insufficient documentation

## 2022-02-18 DIAGNOSIS — F429 Obsessive-compulsive disorder, unspecified: Secondary | ICD-10-CM | POA: Diagnosis present

## 2022-02-18 LAB — RESP PANEL BY RT-PCR (RSV, FLU A&B, COVID)  RVPGX2
Influenza A by PCR: NEGATIVE
Influenza B by PCR: NEGATIVE
Resp Syncytial Virus by PCR: NEGATIVE
SARS Coronavirus 2 by RT PCR: NEGATIVE

## 2022-02-18 LAB — TROPONIN I (HIGH SENSITIVITY): Troponin I (High Sensitivity): <2 ng/L (ref <18)

## 2022-02-18 MED ORDER — NICOTINE 14 MG/24HR TD PT24
14.0000 mg | MEDICATED_PATCH | Freq: Every day | TRANSDERMAL | Status: DC
  Administered 2022-02-18 – 2022-02-22 (×5): 14 mg via TRANSDERMAL
  Filled 2022-02-18 (×8): qty 1

## 2022-02-18 MED ORDER — INFLUENZA VAC SPLIT QUAD 0.5 ML IM SUSY
0.5000 mL | PREFILLED_SYRINGE | INTRAMUSCULAR | Status: AC
  Administered 2022-02-21: 0.5 mL via INTRAMUSCULAR
  Filled 2022-02-18: qty 0.5

## 2022-02-18 MED ORDER — TRAZODONE HCL 50 MG PO TABS
50.0000 mg | ORAL_TABLET | Freq: Every evening | ORAL | Status: DC | PRN
  Administered 2022-02-18: 50 mg via ORAL
  Filled 2022-02-18 (×2): qty 1

## 2022-02-18 MED ORDER — PNEUMOCOCCAL VAC POLYVALENT 25 MCG/0.5ML IJ INJ: 0.5000 mL | INJECTION | INTRAMUSCULAR | Status: DC

## 2022-02-18 MED ORDER — OLANZAPINE 10 MG IM SOLR: 5.0000 mg | Freq: Three times a day (TID) | INTRAMUSCULAR | Status: DC | PRN

## 2022-02-18 MED ORDER — ALUM & MAG HYDROXIDE-SIMETH 200-200-20 MG/5ML PO SUSP
30.0000 mL | ORAL | Status: DC | PRN
  Filled 2022-02-18: qty 30

## 2022-02-18 MED ORDER — MAGNESIUM HYDROXIDE 400 MG/5ML PO SUSP: 30.0000 mL | Freq: Every day | ORAL | Status: DC | PRN

## 2022-02-18 MED ORDER — OLANZAPINE 5 MG PO TBDP
5.0000 mg | ORAL_TABLET | Freq: Three times a day (TID) | ORAL | Status: DC | PRN
  Administered 2022-02-18 – 2022-02-21 (×6): 5 mg via ORAL
  Filled 2022-02-18 (×8): qty 1

## 2022-02-18 MED ORDER — OLANZAPINE 5 MG PO TBDP
ORAL_TABLET | ORAL | Status: AC
  Filled 2022-02-18: qty 1

## 2022-02-18 MED ORDER — PNEUMOCOCCAL 20-VAL CONJ VACC 0.5 ML IM SUSY
0.5000 mL | PREFILLED_SYRINGE | INTRAMUSCULAR | Status: DC
  Filled 2022-02-18: qty 0.5

## 2022-02-18 MED ORDER — HYDROXYZINE HCL 25 MG PO TABS
25.0000 mg | ORAL_TABLET | Freq: Three times a day (TID) | ORAL | Status: DC | PRN
  Administered 2022-02-18 – 2022-02-19 (×3): 25 mg via ORAL
  Filled 2022-02-18 (×4): qty 1

## 2022-02-18 MED ORDER — ACETAMINOPHEN 325 MG PO TABS: 650.0000 mg | ORAL_TABLET | Freq: Four times a day (QID) | ORAL | Status: DC | PRN

## 2022-02-18 NOTE — Progress Notes (Signed)
The patient attended the evening N.A.meeting.
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
## 2022-02-18 NOTE — Plan of Care (Signed)
  Problem: Education: Goal: Knowledge of [facility] Education information/materials will improve Outcome: Progressing Goal: Emotional status will improve Outcome: Progressing Goal: Mental status will improve Outcome: Progressing Goal: Verbalization of understanding the information provided will improve Outcome: Progressing   Problem: Activity: Goal: Interest or engagement in activities will improve Outcome: Progressing Goal: Sleeping patterns will improve Outcome: Progressing   Problem: Coping: Goal: Ability to verbalize frustrations and anger appropriately will improve Outcome: Progressing Goal: Ability to demonstrate self-control will improve Outcome: Progressing   Problem: Health Behavior/Discharge Planning: Goal: Identification of resources available to assist in meeting health care needs will improve Outcome: Progressing Goal: Compliance with treatment plan for underlying cause of condition will improve Outcome: Progressing   Problem: Physical Regulation: Goal: Ability to maintain clinical measurements within normal limits will improve Outcome: Progressing   Problem: Safety: Goal: Periods of time without injury will increase Outcome: Progressing
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
## 2022-02-18 NOTE — Progress Notes (Signed)
Nursing 1:1 Note  Pt is with visitor in dayroom, calm, and breathing unlabored. Pt remains safe on 1:1 and contracts for safety. Pt went to the bathroom and was given dinner.

## 2022-02-18 NOTE — Progress Notes (Signed)
Pt attempted to place silicone retainers in her piercings but they continually fell out or were incorrect sizes. Pt was becoming agitated and tearful. Per [facility] pt could keep piercings in while on the 1:1. Pt wanted it noted her distress in removing them, and stated that she would rather go home than take them out. Pt was educated on the safety risk they cause including her harming herself or someone harming her. Pt acknowledged these risks. Pt remains on a 1:1 and contracts for safety.

## 2022-02-18 NOTE — Consult Note (Signed)
[facility] ED ASSESSMENT   Reason for Consult:  Psych Consult Referring Physician:  Dr. Zenia Resides Patient Identification: [name] MRN:  [id number] ED Chief Complaint: Suicide ideation  Diagnosis:  Principal Problem:   Suicide ideation Active Problems:   Substance abuse [facility])   ED Assessment Time Calculation: Start Time: 0945 Stop Time: 1020 Total Time in Minutes (Assessment Completion): 35   HPI:  Per Triage Note " Says she has relapsed with use of IV cocaine + tap water 15 days ago. Last use was several hours ago.  Sts being seen 02/02/22 and dx with PNA. Subjective worsening.  Swelling to left hand with puncture sites.  Suicidal ideation that intrusive per pt. Says no plan of acting on them but ideation of cutting self with "something sharp".    Subjective:  [name], 37 y.o., female patient seen face to face by this provider, consulted with Dr. Dwyane Dee; and chart reviewed on 02/18/22.  On evaluation [name] reports that she is not feeling very good, due to intrusive thoughts she continues to have about hurting herself. Patient is also stating she is having anxiety (rates it as a 5, on scale) she is ready to be transferred to inpatient psych hospital. Patient continues to endorse SI by cutting self with something sharp but says she will not attempt to act on it while here in the hospital. Denies HI/AVH. Patient says her sleep was good, just kept being interrupted by staff, but it was better than she had been getting. Appetite is fair, not able to eat, a full meal but has been eating snacks.      During evaluation [name] is laying in hospital bed in no acute distress. She is alert, oriented x 4, calm, cooperative and attentive.  Her mood is sad with flat affect.  She has normal speech, and behavior.  Objectively there is no evidence of psychosis/mania or delusional thinking. Patient is able to converse coherently, no distractibility, or pre-occupation. Patient continues to  endorse SI by cutting self with something sharp but says she will not attempt to act on it while here in the hospital.  Denies homicidal ideation, psychosis, and paranoia.    Past Psychiatric History: Bipolar 2, substance abuse, she has been admitted to several psychiatric facilities before  Risk to Self or Others: Is the patient at risk to self? Yes Has the patient been a risk to self in the past 6 months? No Has the patient been a risk to self within the distant past? No Is the patient a risk to others? No Has the patient been a risk to others in the past 6 months? No Has the patient been a risk to others within the distant past? No  [place] Scale:  [place] ED from 02/17/2022 in [place] DEPT ED from 02/02/2022 in [place] DEPT Video Visit from 12/22/2021 in [place] High Risk No Risk Error: Q3, 4, or 5 should not be populated when Q2 is No       AIMS:  , , ,  ,   ASAM: ASAM Multidimensional Assessment Summary Dimension 1:  Description of individual's past and current experiences of substance use and withdrawal: Patient reports only feeling nervous when not using substances DImension 1:  Acute Intoxication and/or Withdrawal Potential Severity Rating: Mild Dimension 2:  Description of patient's biomedical conditions and  complications: No biomedical conditions reported Dimension 2:  Biomedical  Conditions and Complications Severity Rating: None Dimension 3:  Description of emotional, behavioral, or cognitive conditions and complications: Patient experiencing challenges with MDD, Bipolar in addition to substance use Dimension 3:  Emotional, behavioral or cognitive (EBC) conditions and complications severity rating: Moderate Dimension 4:  Description of Readiness to Change criteria: Patient reports she is interested in help Dimension 4:  Readiness to Change  Severity Rating: None Dimension 5:  Relapse, continued use, or continued problem potential critiera description: Patient unable to recall moments of sobriety Dimension 5:  Relapse, continued use, or continued problem potential severity rating: Severe Dimension 6:  Recovery/Iiving environment criteria description: Reports her mother is supportive Dimension 6:  Recovery/living environment severity rating: None ASAM's Severity Rating Score: 5 ASAM Recommended Level of Treatment: Level II Intensive Outpatient Treatment  Substance Abuse:  Alcohol / Drug Use Pain Medications: See MAR Prescriptions: See MAR Over the Counter: See MAR History of alcohol / drug use?: Yes Longest period of sobriety (when/how long): Patient unable to recall Negative Consequences of Use:  (None) Withdrawal Symptoms: Other (Comment) (Nervous)  Past Medical History:  Past Medical History:  Diagnosis Date   Allergy    Anxiety    Asthma    Bipolar 2 disorder ([place])    Borderline personality disorder ([place])    Depression    Eczema    Former smoker    Obesity    PTSD (post-traumatic stress disorder)    Urticaria     Past Surgical History:  Procedure Laterality Date   Thumb surgery Left    Family History:  Family History  Problem Relation Age of Onset   Healthy Mother    Healthy Father    Alzheimer's disease Maternal Grandmother    Skin cancer Paternal Grandmother    Allergic rhinitis Paternal Grandmother    Asthma Paternal Grandmother    Angioedema Neg Hx    Eczema Neg Hx    Urticaria Neg Hx     Social History:  Social History   Substance and Sexual Activity  Alcohol Use Yes   Alcohol/week: 6.0 - 12.0 standard drinks of alcohol   Types: 6 - 12 Cans of beer per week     Social History   Substance and Sexual Activity  Drug Use Yes   Frequency: 4.0 times per week   Types: Marijuana   Comment: Hx of opioid dependency    Social History   Socioeconomic History   Marital status: Single     Spouse name: Not on file   Number of children: 0   Years of education: 13   Highest education level: Not on file  Occupational History   Not on file  Tobacco Use   Smoking status: Every Day    Packs/day: 1.00    Years: 9.00    Total pack years: 9.00    Types: Cigarettes    Passive exposure: Current   Smokeless tobacco: Never  Vaping Use   Vaping Use: Former  Substance and Sexual Activity   Alcohol use: Yes    Alcohol/week: 6.0 - 12.0 standard drinks of alcohol    Types: 6 - 12 Cans of beer per week   Drug use: Yes    Frequency: 4.0 times per week    Types: Marijuana    Comment: Hx of opioid dependency   Sexual activity: Yes    Partners: Female, Female    Birth control/protection: Pill    Comment: First IC @ 18, No Hx of STIs  Other Topics Concern  Not on file  Social History Narrative   Fun: Play video games and watch movies.   Denies abuse and feels safe at home.   Social Determinants of Health   Financial Resource Strain: Not on file  Food Insecurity: Food Insecurity Present (11/13/2021)   Hunger Vital Sign    Worried About Running Out of Food in the Last Year: Sometimes true    Ran Out of Food in the Last Year: Sometimes true  Transportation Needs: Unmet Transportation Needs (11/13/2021)   PRAPARE - Hydrologist (Medical): Yes    Lack of Transportation (Non-Medical): Yes  Physical Activity: Not on file  Stress: Not on file  Social Connections: Not on file      Allergies:   Allergies  Allergen Reactions   Benztropine Other (See Comments)    agitation     Other Other (See Comments)    Opiates- history of dependency    Lamictal [Lamotrigine] Rash    Labs:  Results for orders placed or performed during the hospital encounter of 02/17/22 (from the past 48 hour(s))  Comprehensive metabolic panel     Status: Abnormal   Collection Time: 02/17/22  9:05 PM  Result Value Ref Range   Sodium 141 135 - 145 mmol/L   Potassium 3.9 3.5  - 5.1 mmol/L   Chloride 107 98 - 111 mmol/L   CO2 24 22 - 32 mmol/L   Glucose, Bld 123 (H) 70 - 99 mg/dL    Comment: Glucose reference range applies only to samples taken after fasting for at least 8 hours.   BUN 7 6 - 20 mg/dL   Creatinine, Ser 0.91 0.44 - 1.00 mg/dL   Calcium 8.7 (L) 8.9 - 10.3 mg/dL   Total Protein 6.6 6.5 - 8.1 g/dL   Albumin 3.4 (L) 3.5 - 5.0 g/dL   AST 26 15 - 41 U/L   ALT 30 0 - 44 U/L   Alkaline Phosphatase 64 38 - 126 U/L   Total Bilirubin 0.4 0.3 - 1.2 mg/dL   GFR, Estimated >60 >60 mL/min    Comment: (NOTE) Calculated using the CKD-EPI Creatinine Equation (2021)    Anion gap 10 5 - 15    Comment: Performed at [facility], [place] [address], [place], [place] [address]  Ethanol     Status: None   Collection Time: 02/17/22  9:05 PM  Result Value Ref Range   Alcohol, Ethyl (B) <10 <10 mg/dL    Comment: (NOTE) Lowest detectable limit for serum alcohol is 10 mg/dL.  For medical purposes only. Performed at [facility], [place] [address], [place], [place] [address]   CBC with Diff     Status: None   Collection Time: 02/17/22  9:05 PM  Result Value Ref Range   WBC 9.2 4.0 - 10.5 K/uL   RBC 4.77 3.87 - 5.11 MIL/uL   Hemoglobin 14.4 12.0 - 15.0 g/dL   HCT 44.8 36.0 - 46.0 %   MCV 93.9 80.0 - 100.0 fL   MCH 30.2 26.0 - 34.0 pg   MCHC 32.1 30.0 - 36.0 g/dL   RDW 11.8 11.5 - 15.5 %   Platelets 272 150 - 400 K/uL   nRBC 0.0 0.0 - 0.2 %   Neutrophils Relative % 63 %   Neutro Abs 5.8 1.7 - 7.7 K/uL   Lymphocytes Relative 28 %   Lymphs Abs 2.6 0.7 - 4.0 K/uL   Monocytes Relative 6 %  Monocytes Absolute 0.5 0.1 - 1.0 K/uL   Eosinophils Relative 2 %   Eosinophils Absolute 0.2 0.0 - 0.5 K/uL   Basophils Relative 1 %   Basophils Absolute 0.1 0.0 - 0.1 K/uL   Immature Granulocytes 0 %   Abs Immature Granulocytes 0.03 0.00 - 0.07 K/uL    Comment: Performed at [facility], [place] [address],  [place], [place] [address]  Troponin I (High Sensitivity)     Status: None   Collection Time: 02/17/22  9:05 PM  Result Value Ref Range   Troponin I (High Sensitivity) 3 <18 ng/L    Comment: (NOTE) Elevated high sensitivity troponin I (hsTnI) values and significant  changes across serial measurements may suggest ACS but many other  chronic and acute conditions are known to elevate hsTnI results.  Refer to the "Links" section for chest pain algorithms and additional  guidance. Performed at [facility], [place] [address] [place], [place] [address]   Urine rapid drug screen (hosp performed)     Status: Abnormal   Collection Time: 02/17/22  9:07 PM  Result Value Ref Range   Opiates NONE DETECTED NONE DETECTED   Cocaine POSITIVE (A) NONE DETECTED   Benzodiazepines NONE DETECTED NONE DETECTED   Amphetamines NONE DETECTED NONE DETECTED   Tetrahydrocannabinol POSITIVE (A) NONE DETECTED   Barbiturates NONE DETECTED NONE DETECTED    Comment: (NOTE) DRUG SCREEN FOR MEDICAL PURPOSES ONLY.  IF CONFIRMATION IS NEEDED FOR ANY PURPOSE, NOTIFY LAB WITHIN 5 DAYS.  LOWEST DETECTABLE LIMITS FOR URINE DRUG SCREEN Drug Class                     Cutoff (ng/mL) Amphetamine and metabolites    1000 Barbiturate and metabolites    200 Benzodiazepine                 200 Opiates and metabolites        300 Cocaine and metabolites        300 THC                            50 Performed at [facility]: [place], [place] [address], [place], [place] [address]   Pregnancy, urine     Status: None   Collection Time: 02/17/22  9:07 PM  Result Value Ref Range   Preg Test, Ur NEGATIVE NEGATIVE    Comment:        THE SENSITIVITY OF THIS METHODOLOGY IS >20 mIU/mL. Performed at [facility], [place] [address] [place], [place] [address]   Resp panel by RT-PCR (RSV, Flu A&B, Covid) Anterior Nasal Swab     Status: None   Collection Time: 02/18/22  4:52 AM   Specimen: Anterior  Nasal Swab  Result Value Ref Range   SARS Coronavirus 2 by RT PCR NEGATIVE NEGATIVE    Comment: (NOTE) SARS-CoV-2 target nucleic acids are NOT DETECTED.  The SARS-CoV-2 RNA is generally detectable in upper respiratory specimens during the acute phase of infection. The lowest concentration of SARS-CoV-2 viral copies this assay can detect is 138 copies/mL. A negative result does not preclude SARS-Cov-2 infection and should not be used as the sole basis for treatment or other patient management decisions. A negative result may occur with  improper specimen collection/handling, submission of specimen other than nasopharyngeal swab, presence of viral mutation(s) within the areas targeted by this assay, and inadequate number of viral copies(<138  copies/mL). A negative result must be combined with clinical observations, patient history, and epidemiological information. The expected result is Negative.  Fact Sheet for Patients:  EntrepreneurPulse.com.au  Fact Sheet for Healthcare Providers:  IncredibleEmployment.be  This test is no t yet approved or cleared by the [place] FDA and  has been authorized for detection and/or diagnosis of SARS-CoV-2 by FDA under an Emergency Use Authorization (EUA). This EUA will remain  in effect (meaning this test can be used) for the duration of the COVID-19 declaration under Section 564(b)(1) of the Act, 21 U.S.C.section 360bbb-3(b)(1), unless the authorization is terminated  or revoked sooner.       Influenza A by PCR NEGATIVE NEGATIVE   Influenza B by PCR NEGATIVE NEGATIVE    Comment: (NOTE) The Xpert Xpress SARS-CoV-2/FLU/RSV plus assay is intended as an aid in the diagnosis of influenza from Nasopharyngeal swab specimens and should not be used as a sole basis for treatment. Nasal washings and aspirates are unacceptable for Xpert Xpress SARS-CoV-2/FLU/RSV testing.  Fact Sheet for  Patients: EntrepreneurPulse.com.au  Fact Sheet for Healthcare Providers: IncredibleEmployment.be  This test is not yet approved or cleared by the [place] FDA and has been authorized for detection and/or diagnosis of SARS-CoV-2 by FDA under an Emergency Use Authorization (EUA). This EUA will remain in effect (meaning this test can be used) for the duration of the COVID-19 declaration under Section 564(b)(1) of the Act, 21 U.S.C. section 360bbb-3(b)(1), unless the authorization is terminated or revoked.     Resp Syncytial Virus by PCR NEGATIVE NEGATIVE    Comment: (NOTE) Fact Sheet for Patients: EntrepreneurPulse.com.au  Fact Sheet for Healthcare Providers: IncredibleEmployment.be  This test is not yet approved or cleared by the [place] FDA and has been authorized for detection and/or diagnosis of SARS-CoV-2 by FDA under an Emergency Use Authorization (EUA). This EUA will remain in effect (meaning this test can be used) for the duration of the COVID-19 declaration under Section 564(b)(1) of the Act, 21 U.S.C. section 360bbb-3(b)(1), unless the authorization is terminated or revoked.  Performed at [facility], [place] [address] [place], [place] [address]   Troponin I (High Sensitivity)     Status: None   Collection Time: 02/18/22  5:18 AM  Result Value Ref Range   Troponin I (High Sensitivity) <2 <18 ng/L    Comment: (NOTE) Elevated high sensitivity troponin I (hsTnI) values and significant  changes across serial measurements may suggest ACS but many other  chronic and acute conditions are known to elevate hsTnI results.  Refer to the "Links" section for chest pain algorithms and additional  guidance. Performed at [facility], [place] [address] [place], [place] [address]     Current Facility-Administered Medications  Medication Dose Route Frequency  Provider Last Rate Last Admin   albuterol (PROVENTIL) (2.5 MG/3ML) 0.083% nebulizer solution 2.5 mg  2.5 mg Nebulization Q6H PRN Lacretia Leigh, MD       fluticasone Asencion Islam) 50 MCG/ACT nasal spray 2 spray  2 spray Each Nare Daily Lacretia Leigh, MD   2 spray at 02/18/22 0912   hydrOXYzine (ATARAX) tablet 50 mg  50 mg Oral Daily PRN Lacretia Leigh, MD   50 mg at 02/18/22 0505   mometasone-formoterol (DULERA) 200-5 MCG/ACT inhaler 2 puff  2 puff Inhalation BID Lacretia Leigh, MD   2 puff at 02/18/22 6948   multivitamin with minerals tablet 1 tablet  1 tablet Oral Daily Lacretia Leigh, MD   1 tablet at 02/18/22  0911   OLANZapine zydis (ZYPREXA) disintegrating tablet 5 mg  5 mg Oral BID PRN Lacretia Leigh, MD   5 mg at 02/17/22 2359   Oxcarbazepine (TRILEPTAL) tablet 300 mg  300 mg Oral BID Lacretia Leigh, MD   300 mg at 02/18/22 0911   paliperidone (INVEGA) 24 hr tablet 6 mg  6 mg Oral QHS Lacretia Leigh, MD   6 mg at 02/17/22 2359   prazosin (MINIPRESS) capsule 1 mg  1 mg Oral Daily Lacretia Leigh, MD   1 mg at 02/18/22 0911   prazosin (MINIPRESS) capsule 3 mg  3 mg Oral QHS Lacretia Leigh, MD   3 mg at 02/17/22 2358   traZODone (DESYREL) tablet 50 mg  50 mg Oral QHS PRN Lacretia Leigh, MD   50 mg at 02/17/22 2359   Current Outpatient Medications  Medication Sig Dispense Refill   albuterol (PROVENTIL) (2.5 MG/3ML) 0.083% nebulizer solution Take 3 mLs (2.5 mg total) by nebulization every 6 (six) hours as needed for wheezing or shortness of breath. 150 mL 1   albuterol (VENTOLIN HFA) 108 (90 Base) MCG/ACT inhaler Inhale 2 puffs into the lungs every 6 (six) hours as needed for wheezing or shortness of breath. 8 g 2   budesonide-formoterol (SYMBICORT) 160-4.5 MCG/ACT inhaler Inhale 2 puffs into the lungs in the morning and at bedtime. 1 each 5   calcium carbonate (OS-CAL - DOSED IN MG OF ELEMENTAL CALCIUM) 1250 (500 Ca) MG tablet Take 1 tablet by mouth daily with breakfast.     Ferrous Sulfate (IRON  PO) Take 1 tablet by mouth daily.     fluticasone (FLONASE) 50 MCG/ACT nasal spray Place 2 sprays into both nostrils daily. 1 g 5   hydrOXYzine (VISTARIL) 50 MG capsule Take 1 capsule (50 mg total) by mouth daily as needed. (Patient taking differently: Take 50 mg by mouth at bedtime.) 30 capsule 1   levonorgestrel-ethinyl estradiol (NORDETTE) 0.15-30 MG-MCG tablet Take 1 tablet by mouth at bedtime. 84 tablet 1   montelukast (SINGULAIR) 10 MG tablet TAKE ONE TABLET BY MOUTH AT BEDTIME **NEED OFFICE VISIT** 30 tablet 0   Multiple Vitamin (MULTIVITAMIN WITH MINERALS) TABS tablet Take 1 tablet by mouth daily. 30 tablet 0   OLANZapine zydis (ZYPREXA ZYDIS) 5 MG disintegrating tablet Take 1 tablet (5 mg total) by mouth 2 (two) times daily as needed. 60 tablet 1   omeprazole (PRILOSEC) 40 MG capsule Take 1 capsule (40 mg total) by mouth 2 (two) times daily. 60 capsule 5   Oxcarbazepine (TRILEPTAL) 300 MG tablet Take 1 tablet (300 mg total) by mouth 2 (two) times daily. 60 tablet 1   paliperidone (INVEGA) 6 MG 24 hr tablet Take 1 tablet (6 mg total) by mouth at bedtime. 30 tablet 1   Potassium (POTASSIMIN PO) Take 1 tablet by mouth daily.     prazosin (MINIPRESS) 1 MG capsule Take 1 capsule (1 mg total) by mouth daily. 30 capsule 1   prazosin (MINIPRESS) 1 MG capsule Take 3 capsules (3 mg total) by mouth at bedtime. 90 capsule 1   THIAMINE HCL PO Take 1 tablet by mouth daily.     traZODone (DESYREL) 50 MG tablet Take 1 tablet (50 mg total) by mouth at bedtime as needed for sleep. 30 tablet 1   triamcinolone cream (KENALOG) 0.1 % Apply 1 Application topically 2 (two) times daily. (Patient taking differently: Apply 1 Application topically 2 (two) times daily as needed (rash).) 30 g 1   amoxicillin-clavulanate (AUGMENTIN) 875-125  MG tablet Take 1 tablet by mouth every 12 (twelve) hours. (Patient not taking: Reported on 02/18/2022) 14 tablet 0   doxycycline (VIBRAMYCIN) 100 MG capsule Take 1 capsule (100 mg  total) by mouth 2 (two) times daily. (Patient not taking: Reported on 02/18/2022) 20 capsule 0   fluconazole (DIFLUCAN) 150 MG tablet Take 1 tablet (150 mg total) by mouth every 3 (three) days. (Patient not taking: Reported on 02/18/2022) 2 tablet 0   predniSONE (DELTASONE) 10 MG tablet Take two tablets ('20mg'$ ) twice daily for three days, then one tablet ('10mg'$ ) twice daily for three days, then STOP. (Patient not taking: Reported on 02/18/2022) 18 tablet 0    Musculoskeletal: Strength & Muscle Tone: within normal limits Gait & Station: normal Patient leans: N/A   Psychiatric Specialty Exam: Presentation  General Appearance:  Appropriate for Environment  Eye Contact: Good  Speech: Clear and Coherent  Speech Volume: Normal  Handedness: Right   Mood and Affect  Mood: Anxious  Affect: Flat; Appropriate   Thought Process  Thought Processes: Coherent  Descriptions of Associations:Intact  Orientation:Full (Time, Place and Person)  Thought Content:WDL  History of Schizophrenia/Schizoaffective disorder:No  Duration of Psychotic Symptoms:No data recorded Hallucinations:Hallucinations: None  Ideas of Reference:None  Suicidal Thoughts:Suicidal Thoughts: Yes, Passive SI Passive Intent and/or Plan: Without Intent  Homicidal Thoughts:Homicidal Thoughts: No   Sensorium  Memory: Immediate Good; Recent Good; Remote Fair  Judgment: Fair  Insight: Fair   Materials engineer: Fair  Attention Span: Fair  Recall: Good  Fund of Knowledge: Good  Language: Good   Psychomotor Activity  Psychomotor Activity: Psychomotor Activity: Normal   Assets  Assets: Communication Skills; Housing; Social Support    Sleep  Sleep: Sleep: Good   Physical Exam: Physical Exam Eyes:     Pupils: Pupils are equal, round, and reactive to light.  Pulmonary:     Effort: Pulmonary effort is normal.  Neurological:     Mental Status: She is alert.   Psychiatric:        Attention and Perception: Attention normal.        Mood and Affect: Mood is anxious.        Speech: Speech normal.        Behavior: Behavior is cooperative.        Thought Content: Thought content includes suicidal ideation. Thought content includes suicidal plan.        Cognition and Memory: Cognition normal.        Judgment: Judgment is impulsive.    Review of Systems  Constitutional: Negative.   Respiratory: Negative.    Musculoskeletal: Negative.   Skin: Negative.   Psychiatric/Behavioral:  Positive for substance abuse and suicidal ideas.    Blood pressure 115/78, pulse 78, temperature (!) 97.5 F (36.4 C), temperature source Oral, resp. rate 16, height '5\' 2"'$  (1.575 m), weight 104.3 kg, last menstrual period 02/16/2022, SpO2 97 %. Body mass index is 42.07 kg/m.   Medical Decision Making: Patient continues to require inpatient Psychiatric hospitalization. Patient admitted to [facility] today 02/18/2022.     Disposition: Recommend psychiatric Inpatient admission when medically cleared.  Dymir Neeson MOTLEY-MANGRUM, PMHNP 02/18/2022 10:26 AM

## 2022-02-18 NOTE — BH Assessment (Signed)
Clinician messaged Bobby Brooks, RN: "Hey. It's Trey with TTS. Is the pt able to engage in the assessment, if so the pt will need to be placed in a private room. Is the pt under IVC? Also is the pt medically cleared?"   Clinician awaiting response.    Reegan Mctighe D Mena Simonis, MS, LCMHC, CRC Triage Specialist [phone]  

## 2022-02-18 NOTE — Tx Team (Signed)
Initial Treatment Plan 02/18/2022 3:22 PM ISA HITZ NOT:[id number]    PATIENT STRESSORS: Financial difficulties   Substance abuse     PATIENT STRENGTHS: Average or above average intelligence  General fund of knowledge  Supportive family/friends    PATIENT IDENTIFIED PROBLEMS: Suicidal Ideation  Substance abuse                   DISCHARGE CRITERIA:  Ability to meet basic life and health needs  PRELIMINARY DISCHARGE PLAN: Return to previous living arrangement Return to previous work or school arrangements  PATIENT/FAMILY INVOLVEMENT: This treatment plan has been presented to and reviewed with the patient, [name], and/or family member, .  The patient and family have been given the opportunity to ask questions and make suggestions.  Johann Capers, RN 02/18/2022, 3:22 PM

## 2022-02-18 NOTE — Progress Notes (Addendum)
Pt was accepted to [facility] 02/18/2022. Bed assignment: 307-1  Pt meets inpatient criteria per Charmaine Downs, NP  Attending Physician will be Janine Limbo, MD  Report can be called to: Adult unit: [phone]  Bed is ready now  Care Team Notified: [facility] Lynnda Shields, RN, Enriqueta Shutter, RN, and Charmaine Downs, NP  [place], [place]  02/18/2022 10:35 AM

## 2022-02-19 ENCOUNTER — Other Ambulatory Visit (HOSPITAL_COMMUNITY): Payer: Self-pay

## 2022-02-19 LAB — LIPID PANEL
Cholesterol: 218 mg/dL — ABNORMAL HIGH (ref 0–200)
HDL: 54 mg/dL (ref >40)
LDL Cholesterol: 147 mg/dL — ABNORMAL HIGH (ref 0–99)
Total CHOL/HDL Ratio: 4 RATIO
Triglycerides: 84 mg/dL (ref <150)
VLDL: 17 mg/dL (ref 0–40)

## 2022-02-19 LAB — HEMOGLOBIN A1C
Hgb A1c MFr Bld: 5.2 % (ref 4.8–5.6)
Mean Plasma Glucose: 102.54 mg/dL

## 2022-02-19 LAB — TSH: TSH: 2.977 u[IU]/mL (ref 0.350–4.500)

## 2022-02-19 MED ORDER — CALCIUM CARBONATE 1250 (500 CA) MG PO TABS
1.0000 | ORAL_TABLET | Freq: Every day | ORAL | Status: DC
  Administered 2022-02-21 – 2022-02-22 (×2): 1250 mg via ORAL
  Filled 2022-02-19 (×4): qty 1

## 2022-02-19 MED ORDER — AMLODIPINE BESYLATE 5 MG PO TABS
5.0000 mg | ORAL_TABLET | Freq: Every day | ORAL | Status: DC
  Administered 2022-02-19 – 2022-02-22 (×4): 5 mg via ORAL
  Filled 2022-02-19 (×6): qty 1

## 2022-02-19 MED ORDER — PALIPERIDONE ER 3 MG PO TB24: 3.0000 mg | ORAL_TABLET | Freq: Every day | ORAL | Status: DC

## 2022-02-19 MED ORDER — ALBUTEROL SULFATE (2.5 MG/3ML) 0.083% IN NEBU: 2.5000 mg | INHALATION_SOLUTION | Freq: Four times a day (QID) | RESPIRATORY_TRACT | Status: DC | PRN

## 2022-02-19 MED ORDER — HYDROXYZINE HCL 50 MG PO TABS
50.0000 mg | ORAL_TABLET | Freq: Three times a day (TID) | ORAL | Status: DC | PRN
  Administered 2022-02-19 – 2022-02-22 (×7): 50 mg via ORAL
  Filled 2022-02-19 (×7): qty 1

## 2022-02-19 MED ORDER — PRAZOSIN HCL 1 MG PO CAPS
1.0000 mg | ORAL_CAPSULE | Freq: Every day | ORAL | Status: DC
  Administered 2022-02-19 – 2022-02-22 (×4): 1 mg via ORAL
  Filled 2022-02-19 (×6): qty 1

## 2022-02-19 MED ORDER — PANTOPRAZOLE SODIUM 40 MG PO TBEC
40.0000 mg | DELAYED_RELEASE_TABLET | Freq: Every day | ORAL | Status: DC
  Administered 2022-02-19 – 2022-02-22 (×4): 40 mg via ORAL
  Filled 2022-02-19 (×6): qty 1

## 2022-02-19 MED ORDER — GABAPENTIN 100 MG PO CAPS
100.0000 mg | ORAL_CAPSULE | Freq: Three times a day (TID) | ORAL | Status: DC
  Administered 2022-02-19 – 2022-02-22 (×9): 100 mg via ORAL
  Filled 2022-02-19 (×16): qty 1

## 2022-02-19 MED ORDER — LEVONORGESTREL-ETHINYL ESTRAD 0.15-30 MG-MCG PO TABS
1.0000 | ORAL_TABLET | Freq: Every day | ORAL | Status: DC
  Filled 2022-02-19 (×2): qty 1

## 2022-02-19 MED ORDER — CLONIDINE HCL 0.1 MG PO TABS
0.1000 mg | ORAL_TABLET | Freq: Every day | ORAL | Status: AC
  Administered 2022-02-19: 0.1 mg via ORAL
  Filled 2022-02-19 (×2): qty 1

## 2022-02-19 MED ORDER — PALIPERIDONE ER 6 MG PO TB24
6.0000 mg | ORAL_TABLET | Freq: Every day | ORAL | Status: DC
  Administered 2022-02-21 – 2022-02-22 (×2): 6 mg via ORAL
  Filled 2022-02-19 (×3): qty 1

## 2022-02-19 MED ORDER — MONTELUKAST SODIUM 10 MG PO TABS
10.0000 mg | ORAL_TABLET | Freq: Every day | ORAL | Status: DC
  Administered 2022-02-19 – 2022-02-21 (×3): 10 mg via ORAL
  Filled 2022-02-19 (×5): qty 1

## 2022-02-19 MED ORDER — OXCARBAZEPINE 150 MG PO TABS
150.0000 mg | ORAL_TABLET | Freq: Two times a day (BID) | ORAL | Status: DC
  Filled 2022-02-19 (×4): qty 1

## 2022-02-19 MED ORDER — FLUTICASONE FUROATE-VILANTEROL 200-25 MCG/ACT IN AEPB
1.0000 | INHALATION_SPRAY | Freq: Every day | RESPIRATORY_TRACT | Status: DC
  Administered 2022-02-19 – 2022-02-22 (×4): 1 via RESPIRATORY_TRACT
  Filled 2022-02-19 (×2): qty 28

## 2022-02-19 MED ORDER — FERROUS SULFATE 325 (65 FE) MG PO TABS
325.0000 mg | ORAL_TABLET | Freq: Every day | ORAL | Status: DC
  Administered 2022-02-19 – 2022-02-22 (×3): 325 mg via ORAL
  Filled 2022-02-19 (×6): qty 1

## 2022-02-19 MED ORDER — TRAZODONE HCL 50 MG PO TABS
50.0000 mg | ORAL_TABLET | ORAL | Status: AC
  Filled 2022-02-19: qty 1

## 2022-02-19 MED ORDER — FLUTICASONE PROPIONATE 50 MCG/ACT NA SUSP
2.0000 | Freq: Every day | NASAL | Status: DC
  Administered 2022-02-19 – 2022-02-22 (×4): 2 via NASAL
  Filled 2022-02-19 (×2): qty 16

## 2022-02-19 MED ORDER — LOPERAMIDE HCL 2 MG PO CAPS
2.0000 mg | ORAL_CAPSULE | ORAL | Status: DC | PRN
  Administered 2022-02-19: 2 mg via ORAL
  Filled 2022-02-19: qty 1

## 2022-02-19 MED ORDER — LEVONORGESTREL-ETHINYL ESTRAD 0.15-30 MG-MCG PO TABS: 1.0000 | ORAL_TABLET | Freq: Every day | ORAL | Status: DC

## 2022-02-19 MED ORDER — PALIPERIDONE ER 6 MG PO TB24: 6.0000 mg | ORAL_TABLET | Freq: Every day | ORAL | Status: DC

## 2022-02-19 MED ORDER — PALIPERIDONE ER 3 MG PO TB24
3.0000 mg | ORAL_TABLET | Freq: Every day | ORAL | Status: AC
  Administered 2022-02-19 – 2022-02-20 (×2): 3 mg via ORAL
  Filled 2022-02-19 (×3): qty 1

## 2022-02-19 MED ORDER — OXCARBAZEPINE 150 MG PO TABS
150.0000 mg | ORAL_TABLET | Freq: Two times a day (BID) | ORAL | Status: DC
  Administered 2022-02-19 – 2022-02-22 (×6): 150 mg via ORAL
  Filled 2022-02-19 (×9): qty 1

## 2022-02-19 MED ORDER — ALBUTEROL SULFATE HFA 108 (90 BASE) MCG/ACT IN AERS
2.0000 | INHALATION_SPRAY | Freq: Four times a day (QID) | RESPIRATORY_TRACT | Status: DC | PRN
  Administered 2022-02-21: 2 via RESPIRATORY_TRACT
  Filled 2022-02-19: qty 6.7

## 2022-02-19 NOTE — [facility] Group Notes (Signed)
Pt. did attend wrap up group.  Pt. dd participate and was cooperative.

## 2022-02-19 NOTE — [facility] Suicide Risk Assessment (Signed)
[facility] INPATIENT:  Family/Significant Other Suicide Prevention Education  Suicide Prevention Education:  Patient Refusal for Family/Significant Other Suicide Prevention Education: The patient [name] has refused to provide written consent for family/significant other to be provided Family/Significant Other Suicide Prevention Education during admission and/or prior to discharge.  Physician notified.  Frutoso Chase Deicy Rusk 02/19/2022, 10:04 AM

## 2022-02-19 NOTE — Group Note (Signed)
[name] Group Therapy Note   Group Date: 02/19/2022 Start Time: 1100 End Time: 1200  Type of Therapy and Topic:  Group Therapy:  Setting Goals   Participation Level:  Active   Description of Group: In this process group, patients discussed using strengths to work toward goals and address challenges.  Patients identified one goal they are working on. Patients were given the opportunity to share openly and support each other's plan for self-empowerment.  The group discussed the acronym SMART to help individuals develop and work through their established goals. Patients were encouraged to identify a plan to utilize their strengths to work on current challenges and goals.   Therapeutic Goals Patient will verbalize personal strengths/positive qualities and relate how these can assist with achieving desired personal goals. Patients will verbalize affirmation of peers plans for personal change and goal setting. Patients will use the acronym SMART to help develop a plan for their goals. Patients will verbalize a plan for regular reinforcement of personal positive qualities and circumstances.   Summary of Patient Progress: Patient identified the definition of goals. Patient was given the opportunity to share openly and support other group members' plan for self-empowerment. Patient verbalized personal strength and how they relate to achieving the desired goal. Patient was able to identify positive goals to work towards when they return home.  The Pt left the group but did return later in the session.      Therapeutic Modalities Cognitive Behavioral Therapy Motivational Interviewing  Darleen Crocker, [place] 02/19/2022  1:23 PM

## 2022-02-19 NOTE — Progress Notes (Signed)
1:1 Nursing Note  Patient observed attending group in dayroom with MHT present. No distress noted. Patient remains safe with 1:1 observation and Q 15 min checks.

## 2022-02-19 NOTE — H&P (Signed)
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 medical issues.  -Resumed on albuterol inhaler 2 puff Q hrs prn for SOB.  -Resumed on Proventil 2.5 mg nebulizer solution Q 6 hrs prn for severe SOB. -Initiated Norvasc 5 mg po daily for HTN.  -Initiated Clonidine 0.1 mg po x once today for elevated b/p: 130/103 (completed).  -Resumed on Os-cal 500 mg po daily for bone [name].  -Resumed on Ferrous sulfate 325 mg po daily for anemia.  -Resumed on Flonase 2 spray per nare daily for allergies.  -Resumed on Breo Ellipta 1 puff daily for asthma. -Resumed on Nordette 1 tab po q hs for pregnancy prevention.  -Resumed Protonix 40 mg po Q am for acid reflux.  -Resumed on Singulair 10 mg po daily for asthma.  Other PRNS -Continue  Tylenol 650 mg every 6 hours PRN for mild pain -Continue Maalox 30 ml Q 4 hrs PRN for indigestion -Continue MOM 30 ml po Q 6 hrs for constipation  Safety and Monitoring: Voluntary admission to inpatient psychiatric unit for safety, stabilization and treatment Daily contact with patient to assess and evaluate symptoms and progress in treatment Patient's case to be discussed in multi-disciplinary team meeting Observation Level : q15 minute checks Vital signs: q12 hours Precautions: Safety  Discharge Planning: Social work and case management to assist with discharge planning and identification of [name] follow-up needs prior to discharge Estimated LOS: 5-7 days Discharge Concerns: Need to establish a safety plan; Medication compliance and effectiveness Discharge Goals: Return home with outpatient referrals for mental [name] follow-up including medication management/psychotherapy  Observation Level/Precautions:  15 minute checks  Laboratory:   Per [name], current lab results reviewed. UDS (+) for co & THC.  Psychotherapy: Enrolled in the group counseling [name].  Medications: See MAR.   Consultations: As needed.   Discharge Concerns: Safety, mood stability.    Estimated LOS: 3-5 days.  Other: NA    Physician Treatment Plan for Primary Diagnosis: Bipolar 1 disorder, manic, full remission ([place])  Long Term Goal(s): Improvement in symptoms so as ready for discharge  Short Term Goals: Ability to identify changes in lifestyle to reduce recurrence of condition will improve, Ability to verbalize feelings will improve, Ability to disclose and discuss suicidal ideas, and Ability to demonstrate self-control will improve  Physician Treatment Plan for Secondary Diagnosis: Principal Problem:   Bipolar 1 disorder, manic, full remission ([place]) Active Problems:   PTSD (post-traumatic stress disorder)  Long Term Goal(s): Improvement in symptoms so as ready for discharge  Short Term Goals: Ability to  identify and develop effective coping behaviors will improve, Compliance with prescribed medications will improve, and Ability to identify triggers associated with substance abuse/mental [name] issues will improve  I certify that inpatient services furnished can reasonably be expected to improve the patient's condition.    Lindell Spar, NP, pmhnp, fnp-bc. 1/18/20242:08 PM

## 2022-02-19 NOTE — Progress Notes (Signed)
1:1 Nursing Note  Patient observed awake and alert with 1:1 sitter in hallway. No distress noted Pt remains safe with 1:1 observation and Q 15 min checks

## 2022-02-19 NOTE — Progress Notes (Addendum)
Patient provided with 'plastic piercing replacement' in order to keep piercing hole open. Pt encouraged to remove piercing, and offered assistance. Pt became tearful, upset, and refused to remove her piercings. Pt cried, "these plastic ones don't stay in. I don't want to take a chance having these fall out at night!" Pt tearfully remarked,  "thinking about having to remove my piercings is the only thing causing me distress in here."

## 2022-02-19 NOTE — Progress Notes (Signed)
   02/19/22 1200  Psych Admission Type (Psych Patients Only)  Admission Status Voluntary  Psychosocial Assessment  Patient Complaints Anxiety  Eye Contact Fair  Facial Expression Anxious  Affect Anxious  Speech Logical/coherent  Interaction Assertive;Needy  Motor Activity Fidgety  Appearance/Hygiene Unremarkable  Behavior Characteristics Cooperative;Anxious  Mood Anxious  Aggressive Behavior  Effect No apparent injury  Thought Process  Coherency WDL  Content WDL  Delusions None reported or observed  Perception WDL  Hallucination None reported or observed  Judgment Limited  Confusion None  Danger to Self  Current suicidal ideation? Denies  Agreement Not to Harm Self Yes  Description of Agreement verbal contract for safety  Danger to Others  Danger to Others None reported or observed
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
## 2022-02-19 NOTE — Progress Notes (Signed)
1:1 Nursing Note D. Pt presented very anxious, worried,  voiced concern about not having her medications yet. Pt complained of diarrhea this am- reporting that it was from 'her nerves'. Per pt's self inventory pt rated her depression, hopelessness and anxiety a 2/0/8, respectively. Pt currently denies SI/HI and AVH and agrees to contact staff before acting on any harmful thoughts.  A. Labs and vitals monitored. Pt given and educated on medications. Pt given Vistaril for anxiety and Immodium for complaints of diarrhea. Pt supported emotionally and encouraged to express concerns and ask questions.   R. Pt remains safe with 15 minute checks, and 1:1 observation. Will continue POC.
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
## 2022-02-20 ENCOUNTER — Encounter (HOSPITAL_COMMUNITY): Payer: Self-pay

## 2022-02-20 DIAGNOSIS — F3174 Bipolar disorder, in full remission, most recent episode manic: Principal | ICD-10-CM

## 2022-02-20 NOTE — Plan of Care (Signed)
  Problem: Safety: Goal: Periods of time without injury will increase Outcome: Progressing

## 2022-02-20 NOTE — Progress Notes (Signed)
   02/20/22 1323  Psych Admission Type (Psych Patients Only)  Admission Status Voluntary  Psychosocial Assessment  Patient Complaints Anxiety  Eye Contact Fair  Facial Expression Anxious  Affect Anxious  Speech Logical/coherent  Interaction Assertive  Motor Activity Fidgety  Appearance/Hygiene Unremarkable  Behavior Characteristics Cooperative  Mood Anxious  Aggressive Behavior  Effect No apparent injury  Thought Process  Coherency WDL  Content WDL  Delusions None reported or observed  Perception WDL  Hallucination None reported or observed  Judgment Limited  Confusion None  Danger to Self  Current suicidal ideation? Denies  Agreement Not to Harm Self Yes  Description of Agreement Verbal  Danger to Others  Danger to Others None reported or observed

## 2022-02-20 NOTE — Group Note (Signed)
Recreation Therapy Group Note   Group Topic:Stress Management  Group Date: 02/20/2022 Start Time: 0945 End Time: 1003 Facilitators: Ladaja Yusupov-McCall, LRT,CTRS Location: [address] Dayroom   Goal Area(s) Addresses:  Patient will actively participate in stress management techniques presented during session.  Patient will successfully identify benefit of practicing stress management post d/c.    Group Description: Guided Imagery. LRT provided education, instruction, and demonstration on practice of visualization via guided imagery. Patient was asked to participate in the technique introduced during session. LRT read a script that focused on visualizing a peaceful place that helps them relax, decompress and get away mentally.   LRT debriefed including topics of mindfulness, stress management and specific scenarios each patient could use these techniques. Patients were given suggestions of ways to access scripts post d/c and encouraged to explore Youtube and other apps available on smartphones, tablets, and computers.   Affect/Mood: N/A   Participation Level: Did not attend    Clinical Observations/Individualized Feedback:     Plan: Continue to engage patient in RT group sessions 2-3x/week.   Torey Regan-McCall, LRT,CTRS 02/20/2022 11:50 AM

## 2022-02-20 NOTE — Progress Notes (Signed)
Nursing 1:1 note  D: Patient in dayroom attending group. Participating, calm and cooperative. No distress noted.  A: Continues 1:1 for safety   R: Patient remains safe

## 2022-02-20 NOTE — BH IP Treatment Plan (Signed)
Interdisciplinary Treatment and Diagnostic Plan Update  02/20/2022 Time of Session: [place] MRN: [id number]  Principal Diagnosis: Bipolar 1 disorder, manic, full remission ([place])  Secondary Diagnoses: Principal Problem:   Bipolar 1 disorder, manic, full remission ([place]) Active Problems:   PTSD (post-traumatic stress disorder)   Current Medications:  Current Facility-Administered Medications  Medication Dose Route Frequency Provider Last Rate Last Admin   acetaminophen (TYLENOL) tablet 650 mg  650 mg Oral Q6H PRN Motley-Mangrum, Jadeka A, PMHNP       albuterol (PROVENTIL) (2.5 MG/3ML) 0.083% nebulizer solution 2.5 mg  2.5 mg Nebulization Q6H PRN Nwoko, Agnes I, NP       albuterol (VENTOLIN HFA) 108 (90 Base) MCG/ACT inhaler 2 puff  2 puff Inhalation Q6H PRN Nwoko, Agnes I, NP       alum & mag hydroxide-simeth (MAALOX/MYLANTA) 200-200-20 MG/5ML suspension 30 mL  30 mL Oral Q4H PRN Motley-Mangrum, Jadeka A, PMHNP       amLODipine (NORVASC) tablet 5 mg  5 mg Oral Daily Massengill, Nathan, MD   5 mg at 02/20/22 0919   calcium carbonate (OS-CAL - dosed in mg of elemental calcium) tablet 1,250 mg  1 tablet Oral Q breakfast Nwoko, Agnes I, NP       ferrous sulfate tablet 325 mg  325 mg Oral Daily Nwoko, Agnes I, NP   325 mg at 02/20/22 0919   fluticasone (FLONASE) 50 MCG/ACT nasal spray 2 spray  2 spray Each Nare Daily Nwoko, Agnes I, NP   2 spray at 02/20/22 0919   fluticasone furoate-vilanterol (BREO ELLIPTA) 200-25 MCG/ACT 1 puff  1 puff Inhalation Daily Nwoko, Herbert Pun I, NP   1 puff at 02/20/22 0919   gabapentin (NEURONTIN) capsule 100 mg  100 mg Oral Q8H Massengill, Nathan, MD   100 mg at 02/20/22 2297   hydrOXYzine (ATARAX) tablet 50 mg  50 mg Oral TID PRN Janine Limbo, MD   50 mg at 02/19/22 2313   influenza vac split quadrivalent PF (FLUARIX) injection 0.5 mL  0.5 mL Intramuscular Tomorrow-1000 Massengill, Ovid Curd, MD       levonorgestrel-ethinyl estradiol (NORDETTE) 0.15-30  MG-MCG per tablet 1 tablet  1 tablet Oral QHS Massengill, Nathan, MD       loperamide (IMODIUM) capsule 2 mg  2 mg Oral PRN Massengill, Ovid Curd, MD   2 mg at 02/19/22 1004   magnesium hydroxide (MILK OF MAGNESIA) suspension 30 mL  30 mL Oral Daily PRN Motley-Mangrum, Jadeka A, PMHNP       montelukast (SINGULAIR) tablet 10 mg  10 mg Oral QHS Nwoko, Agnes I, NP   10 mg at 02/19/22 2205   nicotine (NICODERM CQ - dosed in mg/24 hours) patch 14 mg  14 mg Transdermal Daily Massengill, Nathan, MD   14 mg at 02/20/22 0921   OLANZapine (ZYPREXA) injection 5 mg  5 mg Intramuscular TID PRN Massengill, Ovid Curd, MD       OLANZapine zydis (ZYPREXA) disintegrating tablet 5 mg  5 mg Oral TID PRN Massengill, Ovid Curd, MD   5 mg at 02/19/22 1359   OXcarbazepine (TRILEPTAL) tablet 150 mg  150 mg Oral Q12H Massengill, Nathan, MD   150 mg at 02/20/22 0920   [START ON 02/21/2022] paliperidone (INVEGA) 24 hr tablet 6 mg  6 mg Oral Daily Nwoko, Agnes I, NP       pantoprazole (PROTONIX) EC tablet 40 mg  40 mg Oral Daily Lindell Spar I, NP   40 mg at 02/20/22 0920   prazosin (  MINIPRESS) capsule 1 mg  1 mg Oral Daily Lindell Spar I, NP   1 mg at 02/20/22 8127   traZODone (DESYREL) tablet 50 mg  50 mg Oral QHS PRN Motley-Mangrum, Al Pimple, PMHNP   50 mg at 02/18/22 2113   traZODone (DESYREL) tablet 50 mg  50 mg Oral NOW Evette Georges, NP       PTA Medications: Medications Prior to Admission  Medication Sig Dispense Refill Last Dose   albuterol (PROVENTIL) (2.5 MG/3ML) 0.083% nebulizer solution Take 3 mLs (2.5 mg total) by nebulization every 6 (six) hours as needed for wheezing or shortness of breath. 150 mL 1    albuterol (VENTOLIN HFA) 108 (90 Base) MCG/ACT inhaler Inhale 2 puffs into the lungs every 6 (six) hours as needed for wheezing or shortness of breath. 8 g 2    amoxicillin-clavulanate (AUGMENTIN) 875-125 MG tablet Take 1 tablet by mouth every 12 (twelve) hours. (Patient not taking: Reported on 02/18/2022) 14 tablet 0     budesonide-formoterol (SYMBICORT) 160-4.5 MCG/ACT inhaler Inhale 2 puffs into the lungs in the morning and at bedtime. 1 each 5    calcium carbonate (OS-CAL - DOSED IN MG OF ELEMENTAL CALCIUM) 1250 (500 Ca) MG tablet Take 1 tablet by mouth daily with breakfast.      doxycycline (VIBRAMYCIN) 100 MG capsule Take 1 capsule (100 mg total) by mouth 2 (two) times daily. (Patient not taking: Reported on 02/18/2022) 20 capsule 0    Ferrous Sulfate (IRON PO) Take 1 tablet by mouth daily.      fluconazole (DIFLUCAN) 150 MG tablet Take 1 tablet (150 mg total) by mouth every 3 (three) days. (Patient not taking: Reported on 02/18/2022) 2 tablet 0    fluticasone (FLONASE) 50 MCG/ACT nasal spray Place 2 sprays into both nostrils daily. 1 g 5    hydrOXYzine (VISTARIL) 50 MG capsule Take 1 capsule (50 mg total) by mouth daily as needed. (Patient taking differently: Take 50 mg by mouth at bedtime.) 30 capsule 1    levonorgestrel-ethinyl estradiol (NORDETTE) 0.15-30 MG-MCG tablet Take 1 tablet by mouth at bedtime. 84 tablet 1    montelukast (SINGULAIR) 10 MG tablet TAKE ONE TABLET BY MOUTH AT BEDTIME **NEED OFFICE VISIT** 30 tablet 0    Multiple Vitamin (MULTIVITAMIN WITH MINERALS) TABS tablet Take 1 tablet by mouth daily. 30 tablet 0    OLANZapine zydis (ZYPREXA ZYDIS) 5 MG disintegrating tablet Take 1 tablet (5 mg total) by mouth 2 (two) times daily as needed. 60 tablet 1    omeprazole (PRILOSEC) 40 MG capsule Take 1 capsule (40 mg total) by mouth 2 (two) times daily. 60 capsule 5    Oxcarbazepine (TRILEPTAL) 300 MG tablet Take 1 tablet (300 mg total) by mouth 2 (two) times daily. 60 tablet 1    paliperidone (INVEGA) 6 MG 24 hr tablet Take 1 tablet (6 mg total) by mouth at bedtime. 30 tablet 1    Potassium (POTASSIMIN PO) Take 1 tablet by mouth daily.      prazosin (MINIPRESS) 1 MG capsule Take 1 capsule (1 mg total) by mouth daily. 30 capsule 1    prazosin (MINIPRESS) 1 MG capsule Take 3 capsules (3 mg total) by mouth  at bedtime. 90 capsule 1    predniSONE (DELTASONE) 10 MG tablet Take two tablets ('20mg'$ ) twice daily for three days, then one tablet ('10mg'$ ) twice daily for three days, then STOP. (Patient not taking: Reported on 02/18/2022) 18 tablet 0    THIAMINE HCL PO Take  1 tablet by mouth daily.      traZODone (DESYREL) 50 MG tablet Take 1 tablet (50 mg total) by mouth at bedtime as needed for sleep. 30 tablet 1    triamcinolone cream (KENALOG) 0.1 % Apply 1 Application topically 2 (two) times daily. (Patient taking differently: Apply 1 Application topically 2 (two) times daily as needed (rash).) 30 g 1     Patient Stressors: Financial difficulties   Substance abuse    Patient Strengths: Average or above average intelligence  General fund of knowledge  Supportive family/friends   Treatment Modalities: Medication Management, Group therapy, Case management,  1 to 1 session with clinician, Psychoeducation, Recreational therapy.   Physician Treatment Plan for Primary Diagnosis: Bipolar 1 disorder, manic, full remission ([place]) Long Term Goal(s): Improvement in symptoms so as ready for discharge   Short Term Goals: Ability to identify and develop effective coping behaviors will improve Compliance with prescribed medications will improve Ability to identify triggers associated with substance abuse/mental health issues will improve Ability to identify changes in lifestyle to reduce recurrence of condition will improve Ability to verbalize feelings will improve Ability to disclose and discuss suicidal ideas Ability to demonstrate self-control will improve  Medication Management: Evaluate patient's response, side effects, and tolerance of medication regimen.  Therapeutic Interventions: 1 to 1 sessions, Unit Group sessions and Medication administration.  Evaluation of Outcomes: Progressing  Physician Treatment Plan for Secondary Diagnosis: Principal Problem:   Bipolar 1 disorder, manic, full remission  ([place]) Active Problems:   PTSD (post-traumatic stress disorder)  Long Term Goal(s): Improvement in symptoms so as ready for discharge   Short Term Goals: Ability to identify and develop effective coping behaviors will improve Compliance with prescribed medications will improve Ability to identify triggers associated with substance abuse/mental health issues will improve Ability to identify changes in lifestyle to reduce recurrence of condition will improve Ability to verbalize feelings will improve Ability to disclose and discuss suicidal ideas Ability to demonstrate self-control will improve     Medication Management: Evaluate patient's response, side effects, and tolerance of medication regimen.  Therapeutic Interventions: 1 to 1 sessions, Unit Group sessions and Medication administration.  Evaluation of Outcomes: Progressing   RN Treatment Plan for Primary Diagnosis: Bipolar 1 disorder, manic, full remission ([place]) Long Term Goal(s): Knowledge of disease and therapeutic regimen to maintain health will improve  Short Term Goals: Ability to remain free from injury will improve, Ability to verbalize frustration and anger appropriately will improve, Ability to demonstrate self-control, Ability to participate in decision making will improve, Ability to verbalize feelings will improve, Ability to disclose and discuss suicidal ideas, Ability to identify and develop effective coping behaviors will improve, and Compliance with prescribed medications will improve  Medication Management: RN will administer medications as ordered by provider, will assess and evaluate patient's response and provide education to patient for prescribed medication. RN will report any adverse and/or side effects to prescribing provider.  Therapeutic Interventions: 1 on 1 counseling sessions, Psychoeducation, Medication administration, Evaluate responses to treatment, Monitor vital signs and CBGs as ordered,  Perform/monitor CIWA, COWS, AIMS and Fall Risk screenings as ordered, Perform wound care treatments as ordered.  Evaluation of Outcomes: Progressing   [name] Treatment Plan for Primary Diagnosis: Bipolar 1 disorder, manic, full remission ([place]) Long Term Goal(s): Safe transition to appropriate next level of care at discharge, Engage patient in therapeutic group addressing interpersonal concerns.  Short Term Goals: Engage patient in aftercare planning with referrals and resources, Increase social support, Increase  ability to appropriately verbalize feelings, Increase emotional regulation, Facilitate acceptance of mental health diagnosis and concerns, Facilitate patient progression through stages of change regarding substance use diagnoses and concerns, and Identify triggers associated with mental health/substance abuse issues  Therapeutic Interventions: Assess for all discharge needs, 1 to 1 time with Social worker, Explore available resources and support systems, Assess for adequacy in community support network, Educate family and significant other(s) on suicide prevention, Complete Psychosocial Assessment, Interpersonal group therapy.  Evaluation of Outcomes: Progressing   Progress in Treatment: Attending groups: Yes. Participating in groups: Yes. Taking medication as prescribed: Yes. Toleration medication: Yes. Family/Significant other contact made: No, will contact:  Does not consent Patient understands diagnosis: Yes. Discussing patient identified problems/goals with staff: Yes. Medical problems stabilized or resolved: Yes. Denies suicidal/homicidal ideation: Yes. Issues/concerns per patient self-inventory: No. Other:   New problem(s) identified: No, Describe:  None Reported  New Short Term/Long Term Goal(s): Medical Stabilization  Patient Goals: Coping Skills  Discharge Plan or Barriers: None Reported  Reason for Continuation of Hospitalization:  Anxiety Depression Mania Medication stabilization  Estimated Length of Stay:  Last [place] Suicide Severity Risk Score: [place] Admission (Current) from 02/18/2022 in [place] [facility] ED from [facility]/17/2022 in [facility] from 02/02/2022 in [facility] at [place] High Risk High Risk No Risk       Last [facility] 2/9 Scores:    12/23/2021    8:37 AM 10/09/2021    3:35 PM 08/26/2021    9:24 AM  Depression screen PHQ 2/9  Decreased Interest 2 0 3  Down, Depressed, Hopeless 2 0 3  PHQ - 2 Score 4 0 6  Altered sleeping 3  3  Tired, decreased energy 3  3  Change in appetite 2  3  Feeling bad or failure about yourself  2  3  Trouble concentrating 3  3  Moving slowly or fidgety/restless 1  3  Suicidal thoughts 0  2  PHQ-9 Score 18  26  Difficult doing work/chores Very difficult  Extremely dIfficult     medication stabilization, elimination of SI thoughts, development of comprehensive mental wellness plan.   Scribe for Treatment Team: Windle Guard, [name] 02/20/2022 11:26 AM

## 2022-02-20 NOTE — Progress Notes (Signed)
Nursing 1:1 note  D: Patient in bed in her room. Resting eyes closed. No distress noted.   A: Continues on 1:1 for safety   R: Patient remains safe

## 2022-02-20 NOTE — Progress Notes (Signed)
[facility] MD Progress Note  02/20/2022 9:17 AM [name]  MRN:  [id number]  Reason for admission: 37 year old Caucasian female with hx of mental illnesses, substance use disorders & multiple psychiatric admissions/treatments. She is known in this [facility] from her previous admissions since her childhood years. Chart review indicated previous hx/tx for bipolar disorder, major depressive disorder & PTSD. She was a recent patient in this [facility] from October 12th thru October 16th, 2023 for the management of worsening symptoms of bipolar disorder. Upon stabilization of symptoms/discharge at the time, she was recommended to continue routine psychiatric care & medication management on an outpatient basis. [name] is being admitted to the [facility] this time around with complaint of intrusive thoughts of self-harm with plan to cut on herself or overdose on medications.   Daily notes: [name] is seen in her room, chart reviewed. The chart findings discussed with the treatment team. She remains on the 1:1 supervision due to multiple facial piecings. She presents alert, oriented & aware of situation. She is making a good eye contact & verbally responsive. She says she is doing well, just tired this morning. She reports, "I'm feeling a lot better today. My mood is calm, just feeling a little tired because I have not been getting much rest in the last few days because of my anxiety. The anxiety is better now. I have no symptoms of depression. I'm sleeping well. I have been attending group sessions. If you guys are planning to discharge me, this coming Sunday will be good because my mom will be available to pick me up after discharge. I'm still planning to enroll in an outpatient substance abuse treatment program after discharge. I would like it if it will be virtual because I do not have a car. I will following up with Dr. Olena Heckle". [name] currently denies any SIHI, AVH, delusional thoughts or paranoia. He does not appear to be responding to  any internal stimuli. There no changes made on her current plan of care. Will continue as already in progress.  Principal Problem: Bipolar 1 disorder, manic, full remission ([place])  Diagnosis: Principal Problem:   Bipolar 1 disorder, manic, full remission ([place]) Active Problems:   PTSD (post-traumatic stress disorder)  Total Time spent with patient:  35 minutes  Past Psychiatric History: See H&P  Past Medical History:  Past Medical History:  Diagnosis Date   Allergy    Anxiety    Asthma    Bipolar 2 disorder ([place])    Borderline personality disorder ([place])    Depression    Eczema    Former smoker    Obesity    PTSD (post-traumatic stress disorder)    Urticaria     Past Surgical History:  Procedure Laterality Date   Thumb surgery Left    Family History:  Family History  Problem Relation Age of Onset   Healthy Mother    Healthy Father    Alzheimer's disease Maternal Grandmother    Skin cancer Paternal Grandmother    Allergic rhinitis Paternal Grandmother    Asthma Paternal Grandmother    Angioedema Neg Hx    Eczema Neg Hx    Urticaria Neg Hx    Family Psychiatric  History: See H&P  Social History:  Social History   Substance and Sexual Activity  Alcohol Use Yes   Alcohol/week: 6.0 - 12.0 standard drinks of alcohol   Types: 6 - 12 Cans of beer per week     Social History   Substance and  Sexual Activity  Drug Use Yes   Frequency: 4.0 times per week   Types: Marijuana, "Crack" cocaine   Comment: Hx of opioid dependency    Social History   Socioeconomic History   Marital status: Single    Spouse name: Not on file   Number of children: 0   Years of education: 13   Highest education level: Not on file  Occupational History   Not on file  Tobacco Use   Smoking status: Every Day    Packs/day: 1.00    Years: 9.00    Total pack years: 9.00    Types: Cigarettes    Passive exposure: Current   Smokeless tobacco: Never  Vaping Use   Vaping Use: Former   Substance and Sexual Activity   Alcohol use: Yes    Alcohol/week: 6.0 - 12.0 standard drinks of alcohol    Types: 6 - 12 Cans of beer per week   Drug use: Yes    Frequency: 4.0 times per week    Types: Marijuana, "Crack" cocaine    Comment: Hx of opioid dependency   Sexual activity: Yes    Partners: Female, Female    Birth control/protection: Pill    Comment: First IC @ 60, No Hx of STIs  Other Topics Concern   Not on file  Social History Narrative   Fun: Play video games and watch movies.   Denies abuse and feels safe at home.   Social Determinants of Health   Financial Resource Strain: Not on file  Food Insecurity: No Food Insecurity (02/18/2022)   Hunger Vital Sign    Worried About Running Out of Food in the Last Year: Never true    Ran Out of Food in the Last Year: Never true  Transportation Needs: No Transportation Needs (02/18/2022)   PRAPARE - Hydrologist (Medical): No    Lack of Transportation (Non-Medical): No  Physical Activity: Not on file  Stress: Not on file  Social Connections: Not on file   Additional Social History:   Sleep: Good  Appetite:  Good  Current Medications: Current Facility-Administered Medications  Medication Dose Route Frequency Provider Last Rate Last Admin   acetaminophen (TYLENOL) tablet 650 mg  650 mg Oral Q6H PRN Motley-Mangrum, Jadeka A, PMHNP       albuterol (PROVENTIL) (2.5 MG/3ML) 0.083% nebulizer solution 2.5 mg  2.5 mg Nebulization Q6H PRN Bolton Canupp I, NP       albuterol (VENTOLIN HFA) 108 (90 Base) MCG/ACT inhaler 2 puff  2 puff Inhalation Q6H PRN Shakena Callari I, NP       alum & mag hydroxide-simeth (MAALOX/MYLANTA) 200-200-20 MG/5ML suspension 30 mL  30 mL Oral Q4H PRN Motley-Mangrum, Jadeka A, PMHNP       amLODipine (NORVASC) tablet 5 mg  5 mg Oral Daily Massengill, Nathan, MD   5 mg at 02/19/22 1248   calcium carbonate (OS-CAL - dosed in mg of elemental calcium) tablet 1,250 mg  1 tablet Oral Q  breakfast Danaiya Steadman I, NP       ferrous sulfate tablet 325 mg  325 mg Oral Daily Tanyia Grabbe I, NP   325 mg at 02/19/22 1127   fluticasone (FLONASE) 50 MCG/ACT nasal spray 2 spray  2 spray Each Nare Daily Lindell Spar I, NP   2 spray at 02/19/22 1217   fluticasone furoate-vilanterol (BREO ELLIPTA) 200-25 MCG/ACT 1 puff  1 puff Inhalation Daily Dallas Scorsone I, NP   1 puff at  02/19/22 1132   gabapentin (NEURONTIN) capsule 100 mg  100 mg Oral Q8H Massengill, Ovid Curd, MD   100 mg at 02/20/22 4818   hydrOXYzine (ATARAX) tablet 50 mg  50 mg Oral TID PRN Janine Limbo, MD   50 mg at 02/19/22 2313   influenza vac split quadrivalent PF (FLUARIX) injection 0.5 mL  0.5 mL Intramuscular Tomorrow-1000 Massengill, Ovid Curd, MD       levonorgestrel-ethinyl estradiol (NORDETTE) 0.15-30 MG-MCG per tablet 1 tablet  1 tablet Oral QHS Massengill, Ovid Curd, MD       loperamide (IMODIUM) capsule 2 mg  2 mg Oral PRN Massengill, Ovid Curd, MD   2 mg at 02/19/22 1004   magnesium hydroxide (MILK OF MAGNESIA) suspension 30 mL  30 mL Oral Daily PRN Motley-Mangrum, Jadeka A, PMHNP       montelukast (SINGULAIR) tablet 10 mg  10 mg Oral QHS Denys Labree, Herbert Pun I, NP   10 mg at 02/19/22 2205   nicotine (NICODERM CQ - dosed in mg/24 hours) patch 14 mg  14 mg Transdermal Daily Massengill, Ovid Curd, MD   14 mg at 02/19/22 0912   OLANZapine (ZYPREXA) injection 5 mg  5 mg Intramuscular TID PRN Massengill, Ovid Curd, MD       OLANZapine zydis (ZYPREXA) disintegrating tablet 5 mg  5 mg Oral TID PRN Janine Limbo, MD   5 mg at 02/19/22 1359   OXcarbazepine (TRILEPTAL) tablet 150 mg  150 mg Oral Q12H Massengill, Ovid Curd, MD   150 mg at 02/19/22 2100   paliperidone (INVEGA) 24 hr tablet 3 mg  3 mg Oral Daily Lindell Spar I, NP   3 mg at 02/19/22 1127   [START ON 02/21/2022] paliperidone (INVEGA) 24 hr tablet 6 mg  6 mg Oral Daily Tramond Slinker, Herbert Pun I, NP       pantoprazole (PROTONIX) EC tablet 40 mg  40 mg Oral Daily Lindell Spar I, NP   40 mg at  02/19/22 1127   prazosin (MINIPRESS) capsule 1 mg  1 mg Oral Daily Lindell Spar I, NP   1 mg at 02/19/22 1127   traZODone (DESYREL) tablet 50 mg  50 mg Oral QHS PRN Motley-Mangrum, Jadeka A, PMHNP   50 mg at 02/18/22 2113   traZODone (DESYREL) tablet 50 mg  50 mg Oral NOW Evette Georges, NP       Lab Results:  Results for orders placed or performed during the hospital encounter of 02/18/22 (from the past 48 hour(s))  Hemoglobin A1c     Status: None   Collection Time: 02/19/22  6:43 AM  Result Value Ref Range   Hgb A1c MFr Bld 5.2 4.8 - 5.6 %    Comment: (NOTE) Pre diabetes:          5.7%-6.4%  Diabetes:              >6.4%  Glycemic control for   <7.0% adults with diabetes    Mean Plasma Glucose 102.54 mg/dL    Comment: Performed at [facility], [place] [address], [place], [place] [address]  Lipid panel     Status: Abnormal   Collection Time: 02/19/22  6:43 AM  Result Value Ref Range   Cholesterol 218 (H) 0 - 200 mg/dL   Triglycerides 84 <150 mg/dL   HDL 54 >40 mg/dL   Total CHOL/HDL Ratio 4.0 RATIO   VLDL 17 0 - 40 mg/dL   LDL Cholesterol 147 (H) 0 - 99 mg/dL    Comment:  Total Cholesterol/HDL:CHD Risk Coronary Heart Disease Risk Table                     Men   Women  1/2 Average Risk   3.4   3.3  Average Risk       5.0   4.4  2 X Average Risk   9.6   7.1  3 X Average Risk  23.4   11.0        Use the calculated Patient Ratio above and the CHD Risk Table to determine the patient's CHD Risk.        ATP III CLASSIFICATION (LDL):  <100     mg/dL   Optimal  100-129  mg/dL   Near or Above                    Optimal  130-159  mg/dL   Borderline  160-189  mg/dL   High  >190     mg/dL   Very High Performed at [facility] [address], [place], [place] [address]   TSH     Status: None   Collection Time: 02/19/22  6:43 AM  Result Value Ref Range   TSH 2.977 0.350 - 4.500 uIU/mL    Comment: Performed by a 3rd Generation assay with a  functional sensitivity of <=0.01 uIU/mL. Performed at [facility], [place] [address], [place], [place] [address]    Blood Alcohol level:  Lab Results  Component Value Date   ETH <10 02/17/2022   ETH 132 (H) 38/25/0539   Metabolic Disorder Labs: Lab Results  Component Value Date   HGBA1C 5.2 02/19/2022   MPG 102.54 02/19/2022   MPG 111 03/05/2021   No results found for: "PROLACTIN" Lab Results  Component Value Date   CHOL 218 (H) 02/19/2022   TRIG 84 02/19/2022   HDL 54 02/19/2022   CHOLHDL 4.0 02/19/2022   VLDL 17 02/19/2022   LDLCALC 147 (H) 02/19/2022   LDLCALC 99 11/12/2021   Physical Findings: AIMS:  , ,  ,  ,    CIWA:    COWS:     Musculoskeletal: Strength & Muscle Tone: within normal limits Gait & Station: normal Patient leans: N/A  Psychiatric Specialty Exam:  Presentation  General Appearance:  Disheveled; Casual  Eye Contact: Good  Speech: Clear and Coherent; Normal Rate  Speech Volume: Normal  Handedness: Right   Mood and Affect  Mood: Anxious; Depressed  Affect: Congruent   Thought Process  Thought Processes: Coherent; Goal Directed; Linear  Descriptions of Associations:Intact  Orientation:Full (Time, Place and Person)  Thought Content:Logical  History of Schizophrenia/Schizoaffective disorder:No  Duration of Psychotic Symptoms:No data recorded Hallucinations:Hallucinations: None  Ideas of Reference:None  Suicidal Thoughts:Suicidal Thoughts: No SI Passive Intent and/or Plan: Without Intent; Without Plan; Without Means to Carry Out; Without Access to Means  Homicidal Thoughts:Homicidal Thoughts: No   Sensorium  Memory: Immediate Good; Recent Good; Remote Good  Judgment: Fair  Insight: Shallow   Executive Functions  Concentration: Fair  Attention Span: Fair  Recall: [place] of Knowledge: Fair  Language: Good  Psychomotor Activity  Psychomotor Activity: Psychomotor Activity:  Restlessness  Assets  Assets: Communication Skills; Desire for Improvement; Financial Resources/Insurance; Housing; Physical Health; Resilience; Social Support  Sleep  Sleep: Sleep: Good Number of Hours of Sleep: 6.5  Physical Exam: Physical Exam Vitals and nursing note reviewed.  HENT:     Nose: Nose normal.  Mouth/Throat:     Pharynx: Oropharynx is clear.  Eyes:     Pupils: Pupils are equal, round, and reactive to light.  Cardiovascular:     Rate and Rhythm: Normal rate.     Pulses: Normal pulses.  Pulmonary:     Effort: Pulmonary effort is normal.  Genitourinary:    Comments: Deferred Musculoskeletal:        General: Normal range of motion.     Cervical back: Normal range of motion.  Skin:    General: Skin is warm and dry.     Comments: Multiple facial piecings.  Neurological:     General: No focal deficit present.     Mental Status: She is alert and oriented to person, place, and time.    Review of Systems  Constitutional:  Negative for chills, diaphoresis and fever.  HENT:  Negative for congestion and sore throat.   Eyes:  Negative for blurred vision.  Respiratory:  Negative for cough, shortness of breath and wheezing.   Cardiovascular:  Negative for chest pain and palpitations.  Gastrointestinal:  Negative for abdominal pain, constipation, diarrhea, heartburn, nausea and vomiting.  Genitourinary:  Negative for dysuria.  Musculoskeletal:  Negative for joint pain and myalgias.  Skin:  Negative for itching and rash.       Multiple facial piecings.  Neurological:  Negative for dizziness, tingling, tremors, sensory change, speech change, focal weakness, seizures, loss of consciousness, weakness and headaches.  Psychiatric/Behavioral:  Positive for depression ("Improving") and substance abuse (Hx Co & THC use disorder.). Negative for hallucinations, memory loss and suicidal ideas. The patient is not nervous/anxious and does not have insomnia.    Blood pressure  112/80, pulse 96, temperature 98.1 F (36.7 C), temperature source Oral, resp. rate 18, height '5\' 2"'$  (1.575 m), weight 101.6 kg, last menstrual period 02/16/2022, SpO2 100 %. Body mass index is 40.97 kg/m.  Treatment Plan Summary: Daily contact with patient to assess and evaluate symptoms and progress in treatment and Medication management.   Continue inpatient hospitalization.  Will continue today 02/20/2022 plan as below except where it is noted.   Principal/active diagnoses.  Bipolar 1 disorder, manic, full remission (HCC) PTSD (post-traumatic stress disorder).    Upon her admission, patient requested to be re-started on her Invega, Vistaril, Minipress, Zyprexa zydis, Trazodone & Trileptal. And because patient has not been on her medications for few days due to her recent relapse & current use of cocaine/THC, she was started on a lower doses of these medications. The plan is to gradually go up on the doses of these medication eventually as the drugs wear out of her system.  Plan:  -Completed Invega 3 mg po bid x 2 days for bipolar d/o  -Continue Invega 6 mg po daily for bipolar d/o (start on 02-21-22).  -Continue on Trileptal 150 mg po bid for mood stabilization.  -Continue on Minipress 1 mg po Q hs for PTSD symptoms.  -Continue Trazodone 50 mg po q hs prn for insomnia.  -Continue Vistaril 25 mg po tid prn for anxiety. -Continue Nicoderm patch 14 mg trans-dermally Q 24 hours for nicotine withdrawal.   Agitation protocols. -Continue Olanzapine 5 mg IM tid prn.  Or -Olanzapine zydis 5 mg po tid prn.   Other medical issues.  -Continue albuterol inhaler 2 puff Q hrs prn for SOB.  -Continue Proventil 2.5 mg nebulizer solution Q 6 hrs prn for severe SOB. -Continue Norvasc 5 mg po daily for HTN.  -Completed Clonidine 0.1 mg po  x once today for elevated b/p: 130/103.  -Continue Os-cal 500 mg po daily for bone health.  -Continue Ferrous sulfate 325 mg po daily for anemia.  -Continue  Flonase 2 spray per nare daily for allergies.  -Continue Breo Ellipta 1 puff daily for asthma. -Continue Nordette 1 tab po q hs for pregnancy prevention.  -Continue Protonix 40 mg po Q am for acid reflux.  -Continue Singulair 10 mg po daily for asthma.   Other PRNS -Continue Tylenol 650 mg every 6 hours PRN for mild pain -Continue Maalox 30 ml Q 4 hrs PRN for indigestion -Continue MOM 30 ml po Q 6 hrs for constipation   Safety and Monitoring: Voluntary admission to inpatient psychiatric unit for safety, stabilization and treatment Daily contact with patient to assess and evaluate symptoms and progress in treatment Patient's case to be discussed in multi-disciplinary team meeting Observation Level : q15 minute checks Vital signs: q12 hours Precautions: Safety   Discharge Planning: Social work and case management to assist with discharge planning and identification of hospital follow-up needs prior to discharge Estimated LOS: 5-7 days Discharge Concerns: Need to establish a safety plan; Medication compliance and effectiveness Discharge Goals: Return home with outpatient referrals for mental health follow-up including medication management/psychotherapy   Lindell Spar, NP, pmhnp, fnp-bc. 02/20/2022, 9:17 AM

## 2022-02-20 NOTE — [facility] Group Notes (Signed)
Pt attended [place] meeting.

## 2022-02-20 NOTE — Group Note (Signed)
Date:  02/20/2022 Time:  4:17 PM  Group Topic/Focus:  Goals Group:   The focus of this group is to help patients establish daily goals to achieve during treatment and discuss how the patient can incorporate goal setting into their daily lives to aide in recovery.    Participation Level:  Active  Participation Quality:  Appropriate  Affect:  Appropriate  Cognitive:  Appropriate  Insight: Appropriate  Engagement in Group:  Engaged  Modes of Intervention:  Exploration  Additional Comments:   Patient wants to learn healthy ways of coping with the issues that arise during  his recovery journey like recognizing the triggers that causes him to have set backs.  Jerrye Beavers 02/20/2022, 4:17 PM

## 2022-02-20 NOTE — Progress Notes (Signed)
   02/20/22 0600  Psych Admission Type (Psych Patients Only)  Admission Status Voluntary  Psychosocial Assessment  Patient Complaints Anxiety  Eye Contact Fair  Facial Expression Anxious  Affect Anxious  Speech Logical/coherent  Interaction Assertive  Motor Activity Fidgety  Appearance/Hygiene Unremarkable  Behavior Characteristics Cooperative  Mood Anxious  Thought Process  Coherency WDL  Content WDL  Delusions None reported or observed  Perception WDL  Hallucination None reported or observed  Judgment Limited  Confusion None  Danger to Self  Current suicidal ideation? Denies  Agreement Not to Harm Self Yes  Description of Agreement verbally contracts  Danger to Others  Danger to Others None reported or observed   Alert/oriented. Makes needs/concerns known to staff. Pleasant cooperative with staff. Denies SI/HI/A/V hallucinations. Med complian. Patient states went to group. Will encourage continue compliance and progression towards goals. Verbally contracted for safety. Will continue to monitor. 1:1 continues

## 2022-02-20 NOTE — Progress Notes (Signed)
   02/20/22 2300  Psych Admission Type (Psych Patients Only)  Admission Status Voluntary  Psychosocial Assessment  Patient Complaints Anxiety  Eye Contact Fair  Facial Expression Anxious  Affect Anxious  Speech Logical/coherent  Interaction Assertive  Motor Activity Fidgety  Appearance/Hygiene Unremarkable  Behavior Characteristics Cooperative  Mood Anxious  Thought Process  Coherency WDL  Content WDL  Delusions None reported or observed  Perception WDL  Hallucination None reported or observed  Judgment Limited  Confusion None  Danger to Self  Current suicidal ideation? Denies  Agreement Not to Harm Self Yes  Description of Agreement Verbal  Danger to Others  Danger to Others None reported or observed   Alert/oriented/ Makes needs/concerns known to staff. Pleasant cooperative with staff. Denies SI/HI/A/V hallucinations.  Med compliant. PRN med given with good effect. Patient states went to group and writes as coping strategies. Will encourage continue compliance and progression towards goals. Verbally contracted for safety. Will continue to monitor.

## 2022-02-21 LAB — BASIC METABOLIC PANEL
Anion gap: 8 (ref 5–15)
BUN: 10 mg/dL (ref 6–20)
CO2: 26 mmol/L (ref 22–32)
Calcium: 9.2 mg/dL (ref 8.9–10.3)
Chloride: 105 mmol/L (ref 98–111)
Creatinine, Ser: 0.76 mg/dL (ref 0.44–1.00)
GFR, Estimated: >60 mL/min (ref >60)
Glucose, Bld: 93 mg/dL (ref 70–99)
Potassium: 3.9 mmol/L (ref 3.5–5.1)
Sodium: 139 mmol/L (ref 135–145)

## 2022-02-21 MED ORDER — TRAZODONE HCL 50 MG PO TABS
25.0000 mg | ORAL_TABLET | Freq: Every evening | ORAL | Status: DC | PRN
  Administered 2022-02-21: 25 mg via ORAL
  Filled 2022-02-21: qty 1

## 2022-02-21 NOTE — Group Note (Signed)
[name] Group Therapy Note  Group Date: 02/21/2022 Start Time: 1010 End Time: 1110   Type of Therapy and Topic:  Group Therapy: Anger Cues and Responses  Participation Level:  Did Not Attend   Description of Group:   In this group, patients learned how to recognize the physical, cognitive, emotional, and behavioral responses they have to anger-provoking situations.  They identified a recent time they became angry and how they reacted.  They analyzed how their reaction was possibly beneficial and how it was possibly unhelpful.  The group discussed a variety of healthier coping skills that could help with such a situation in the future.  Focus was placed on how helpful it is to recognize the underlying emotions to our anger, because working on those can lead to a more permanent solution as well as our ability to focus on the important rather than the urgent.  Therapeutic Goals: Patients will remember their last incident of anger and how they felt emotionally and physically, what their thoughts were at the time, and how they behaved. Patients will identify how their behavior at that time worked for them, as well as how it worked against them. Patients will explore possible new behaviors to use in future anger situations. Patients will learn that anger itself is normal and cannot be eliminated, and that healthier reactions can assist with resolving conflict rather than worsening situations.  Summary of Patient Progress:  Patient was invited to group, did not attend.   Therapeutic Modalities:   Cognitive Behavioral Therapy    [place]quette Old 02/21/2022  2:45 PM

## 2022-02-21 NOTE — Progress Notes (Signed)
   02/21/22 1200  Psych Admission Type (Psych Patients Only)  Admission Status Voluntary  Psychosocial Assessment  Patient Complaints Anxiety;Worrying  Eye Contact Fair  Facial Expression Anxious  Affect Anxious  Speech Logical/coherent  Interaction Assertive  Motor Activity Fidgety  Appearance/Hygiene Unremarkable  Behavior Characteristics Cooperative;Anxious  Mood Anxious  Aggressive Behavior  Effect No apparent injury  Thought Process  Coherency WDL  Content WDL  Delusions None reported or observed  Perception WDL  Hallucination None reported or observed  Judgment Limited  Confusion None  Danger to Self  Current suicidal ideation? Denies  Agreement Not to Harm Self Yes  Description of Agreement verbal contract  Danger to Others  Danger to Others None reported or observed

## 2022-02-21 NOTE — Progress Notes (Signed)
[facility] MD Progress Note  02/21/2022 4:00 PM [name]  MRN:  [id number]  Reason for admission: 37 year old Caucasian female with hx of mental illnesses, substance use disorders & multiple psychiatric admissions/treatments. She is known in this [facility] from her previous admissions since her childhood years. Chart review indicated previous hx/tx for bipolar disorder, major depressive disorder & PTSD. She was a recent patient in this [facility] from October 12th thru October 16th, 2023 for the management of worsening symptoms of bipolar disorder. Upon stabilization of symptoms/discharge at the time, she was recommended to continue routine psychiatric care & medication management on an outpatient basis. [name] is being admitted to the [facility] this time around with complaint of intrusive thoughts of self-harm with plan to cut on herself or overdose on medications.   24 hr chart review: Vital signs within normal limits, one-on-one staff monitoring maintained overnight due to body piercings for safety concerns, as needed medications for the past 24 hours Zyprexa 5 mg given last night and Zyprexa 5 mg today afternoon.  No behavioral concerns noted overnight.  Patient assessment note 02/21/2022: Pt with a euthymic mood, affect is congruent. Her attention to personal hygiene and grooming is fair, eye contact is good, speech is clear & coherent. Thought contents are organized and logical, and pt currently denies SI/HI/AVH or paranoia. There is no evidence of delusional thoughts.  She reports that her mood has significantly improved since being hospitalized.  Patient reports trouble falling asleep last night and staying asleep, but states that it was because she did not take her trazodone 50 mg because it had caused her to be too sedated early the next morning.  She reports wanting the dose of trazodone to be decreased to 25 mg.  She reports wanting for prazosin to be increased to 3 mg nightly for nightmares.  Patient will be  educated to have this medication increased on an outpatient basis since she is being discharged tomorrow, and has complained of lightheadedness and dizziness after taking trazodone 50 mg the previous night.  Prazosin is being deferred to outpatient management, as medication can cause dizziness.  Patient reports a good appetite.  She denies being in any physical pain.  She reported slight tiredness earlier today morning related to not being able to sleep last night.  She was visible attending unit group sessions later in the day.  We are continuing medications as listed below.  Only adjustment being made today is the reduction of trazodone from 50 mg to 25 mg nightly for sleep as per patient's request.  One-on-one patient monitoring continues for safety due to body piercings.  Principal Problem: Bipolar 1 disorder, manic, full remission (HCC)  Diagnosis: Principal Problem:   Bipolar 1 disorder, manic, full remission ([place]) Active Problems:   PTSD (post-traumatic stress disorder)  Total Time spent with patient:  35 minutes  Past Psychiatric History: See H&P  Past Medical History:  Past Medical History:  Diagnosis Date   Allergy    Anxiety    Asthma    Bipolar 2 disorder ([place])    Borderline personality disorder ([place])    Depression    Eczema    Former smoker    Obesity    PTSD (post-traumatic stress disorder)    Urticaria     Past Surgical History:  Procedure Laterality Date   Thumb surgery Left    Family History:  Family History  Problem Relation Age of Onset   Healthy Mother    Healthy Father  Alzheimer's disease Maternal Grandmother    Skin cancer Paternal Grandmother    Allergic rhinitis Paternal Grandmother    Asthma Paternal Grandmother    Angioedema Neg Hx    Eczema Neg Hx    Urticaria Neg Hx    Family Psychiatric  History: See H&P  Social History:  Social History   Substance and Sexual Activity  Alcohol Use Yes   Alcohol/week: 6.0 - 12.0 standard drinks of  alcohol   Types: 6 - 12 Cans of beer per week     Social History   Substance and Sexual Activity  Drug Use Yes   Frequency: 4.0 times per week   Types: Marijuana, "Crack" cocaine   Comment: Hx of opioid dependency    Social History   Socioeconomic History   Marital status: Single    Spouse name: Not on file   Number of children: 0   Years of education: 13   Highest education level: Not on file  Occupational History   Not on file  Tobacco Use   Smoking status: Every Day    Packs/day: 1.00    Years: 9.00    Total pack years: 9.00    Types: Cigarettes    Passive exposure: Current   Smokeless tobacco: Never  Vaping Use   Vaping Use: Former  Substance and Sexual Activity   Alcohol use: Yes    Alcohol/week: 6.0 - 12.0 standard drinks of alcohol    Types: 6 - 12 Cans of beer per week   Drug use: Yes    Frequency: 4.0 times per week    Types: Marijuana, "Crack" cocaine    Comment: Hx of opioid dependency   Sexual activity: Yes    Partners: Female, Female    Birth control/protection: Pill    Comment: First IC @ 67, No Hx of STIs  Other Topics Concern   Not on file  Social History Narrative   Fun: Play video games and watch movies.   Denies abuse and feels safe at home.   Social Determinants of Health   Financial Resource Strain: Not on file  Food Insecurity: No Food Insecurity (02/18/2022)   Hunger Vital Sign    Worried About Running Out of Food in the Last Year: Never true    Ran Out of Food in the Last Year: Never true  Transportation Needs: No Transportation Needs (02/18/2022)   PRAPARE - Hydrologist (Medical): No    Lack of Transportation (Non-Medical): No  Physical Activity: Not on file  Stress: Not on file  Social Connections: Not on file   Additional Social History:   Sleep: Good  Appetite:  Good  Current Medications: Current Facility-Administered Medications  Medication Dose Route Frequency Provider Last Rate Last Admin    acetaminophen (TYLENOL) tablet 650 mg  650 mg Oral Q6H PRN Motley-Mangrum, Jadeka A, PMHNP       albuterol (PROVENTIL) (2.5 MG/3ML) 0.083% nebulizer solution 2.5 mg  2.5 mg Nebulization Q6H PRN Nwoko, Agnes I, NP       albuterol (VENTOLIN HFA) 108 (90 Base) MCG/ACT inhaler 2 puff  2 puff Inhalation Q6H PRN Lindell Spar I, NP   2 puff at 02/21/22 1336   alum & mag hydroxide-simeth (MAALOX/MYLANTA) 200-200-20 MG/5ML suspension 30 mL  30 mL Oral Q4H PRN Motley-Mangrum, Jadeka A, PMHNP       amLODipine (NORVASC) tablet 5 mg  5 mg Oral Daily Massengill, Nathan, MD   5 mg at 02/21/22 0945  calcium carbonate (OS-CAL - dosed in mg of elemental calcium) tablet 1,250 mg  1 tablet Oral Q breakfast Lindell Spar I, NP   1,250 mg at 02/21/22 0945   ferrous sulfate tablet 325 mg  325 mg Oral Daily Lindell Spar I, NP   325 mg at 02/20/22 0919   fluticasone (FLONASE) 50 MCG/ACT nasal spray 2 spray  2 spray Each Nare Daily Lindell Spar I, NP   2 spray at 02/21/22 0958   fluticasone furoate-vilanterol (BREO ELLIPTA) 200-25 MCG/ACT 1 puff  1 puff Inhalation Daily Lindell Spar I, NP   1 puff at 02/21/22 1008   gabapentin (NEURONTIN) capsule 100 mg  100 mg Oral Q8H Massengill, Ovid Curd, MD   100 mg at 02/21/22 1334   hydrOXYzine (ATARAX) tablet 50 mg  50 mg Oral TID PRN Janine Limbo, MD   50 mg at 02/21/22 1331   levonorgestrel-ethinyl estradiol (NORDETTE) 0.15-30 MG-MCG per tablet 1 tablet  1 tablet Oral QHS Massengill, Ovid Curd, MD       loperamide (IMODIUM) capsule 2 mg  2 mg Oral PRN Janine Limbo, MD   2 mg at 02/19/22 1004   magnesium hydroxide (MILK OF MAGNESIA) suspension 30 mL  30 mL Oral Daily PRN Motley-Mangrum, Jadeka A, PMHNP       montelukast (SINGULAIR) tablet 10 mg  10 mg Oral QHS Nwoko, Herbert Pun I, NP   10 mg at 02/20/22 2215   nicotine (NICODERM CQ - dosed in mg/24 hours) patch 14 mg  14 mg Transdermal Daily Massengill, Ovid Curd, MD   14 mg at 02/21/22 1000   OLANZapine (ZYPREXA) injection 5 mg  5  mg Intramuscular TID PRN Massengill, Ovid Curd, MD       OLANZapine zydis (ZYPREXA) disintegrating tablet 5 mg  5 mg Oral TID PRN Janine Limbo, MD   5 mg at 02/21/22 1331   OXcarbazepine (TRILEPTAL) tablet 150 mg  150 mg Oral Q12H Massengill, Ovid Curd, MD   150 mg at 02/21/22 0945   paliperidone (INVEGA) 24 hr tablet 6 mg  6 mg Oral Daily Lindell Spar I, NP   6 mg at 02/21/22 0945   pantoprazole (PROTONIX) EC tablet 40 mg  40 mg Oral Daily Lindell Spar I, NP   40 mg at 02/21/22 0945   prazosin (MINIPRESS) capsule 1 mg  1 mg Oral Daily Lindell Spar I, NP   1 mg at 02/21/22 0945   traZODone (DESYREL) tablet 25 mg  25 mg Oral QHS PRN Nicholes Rough, NP       Lab Results:  Results for orders placed or performed during the hospital encounter of 02/18/22 (from the past 48 hour(s))  Basic metabolic panel     Status: None   Collection Time: 02/21/22  6:42 AM  Result Value Ref Range   Sodium 139 135 - 145 mmol/L   Potassium 3.9 3.5 - 5.1 mmol/L   Chloride 105 98 - 111 mmol/L   CO2 26 22 - 32 mmol/L   Glucose, Bld 93 70 - 99 mg/dL    Comment: Glucose reference range applies only to samples taken after fasting for at least 8 hours.   BUN 10 6 - 20 mg/dL   Creatinine, Ser 0.76 0.44 - 1.00 mg/dL   Calcium 9.2 8.9 - 10.3 mg/dL   GFR, Estimated >60 >60 mL/min    Comment: (NOTE) Calculated using the CKD-EPI Creatinine Equation (2021)    Anion gap 8 5 - 15    Comment: Performed at [facility], [address] [place], [place] [address]   Blood Alcohol level:  Lab Results  Component Value Date   ETH <10 02/17/2022   ETH 132 (H) 03/50/0938   Metabolic Disorder Labs: Lab Results  Component Value Date   HGBA1C 5.2 02/19/2022   MPG 102.54 02/19/2022   MPG 111 03/05/2021   No results found for: "PROLACTIN" Lab Results  Component Value Date   CHOL 218 (H) 02/19/2022   TRIG 84 02/19/2022   HDL 54 02/19/2022   CHOLHDL 4.0 02/19/2022   VLDL 17 02/19/2022   LDLCALC 147 (H)  02/19/2022   LDLCALC 99 11/12/2021   Physical Findings: AIMS: 0 CIWA: n/a COWS:  n/a  Musculoskeletal: Strength & Muscle Tone: within normal limits Gait & Station: normal Patient leans: N/A  Psychiatric Specialty Exam:  Presentation  General Appearance:  Appropriate for Environment; Fairly Groomed  Eye Contact: Good  Speech: Clear and Coherent  Speech Volume: Normal  Handedness: Right   Mood and Affect  Mood: Euthymic  Affect: Congruent   Thought Process  Thought Processes: Coherent  Descriptions of Associations:Intact  Orientation:Full (Time, Place and Person)  Thought Content:Logical  History of Schizophrenia/Schizoaffective disorder:No  Duration of Psychotic Symptoms:No data recorded Hallucinations:Hallucinations: None   Ideas of Reference:None  Suicidal Thoughts:Suicidal Thoughts: No   Homicidal Thoughts:Homicidal Thoughts: No    Sensorium  Memory: Immediate Good  Judgment: Fair  Insight: Good   Executive Functions  Concentration: Good  Attention Span: Good  Recall: Good  Fund of Knowledge: Good  Language: Good  Psychomotor Activity  Psychomotor Activity: Psychomotor Activity: Normal   Assets  Assets: Communication Skills; Housing  Sleep  Sleep: Sleep: Good   Physical Exam: Physical Exam Vitals and nursing note reviewed.  HENT:     Nose: Nose normal.     Mouth/Throat:     Pharynx: Oropharynx is clear.  Eyes:     Pupils: Pupils are equal, round, and reactive to light.  Cardiovascular:     Rate and Rhythm: Normal rate.     Pulses: Normal pulses.  Pulmonary:     Effort: Pulmonary effort is normal.  Genitourinary:    Comments: Deferred Musculoskeletal:        General: Normal range of motion.     Cervical back: Normal range of motion.  Skin:    General: Skin is warm and dry.     Comments: Multiple facial piecings.  Neurological:     General: No focal deficit present.     Mental Status: She  is alert and oriented to person, place, and time.    Review of Systems  Constitutional:  Negative for chills, diaphoresis and fever.  HENT:  Negative for congestion and sore throat.   Eyes:  Negative for blurred vision.  Respiratory:  Negative for cough, shortness of breath and wheezing.   Cardiovascular:  Negative for chest pain and palpitations.  Gastrointestinal:  Negative for abdominal pain, constipation, diarrhea, heartburn, nausea and vomiting.  Genitourinary:  Negative for dysuria.  Musculoskeletal:  Negative for joint pain and myalgias.  Skin:  Negative for itching and rash.       Multiple facial piecings.  Neurological:  Negative for dizziness, tingling, tremors, sensory change, speech change, focal weakness, seizures, loss of consciousness, weakness and headaches.  Psychiatric/Behavioral:  Positive for depression ("Improving") and substance abuse (Hx Co & THC use disorder.). Negative for hallucinations, memory loss and suicidal ideas. The patient is not nervous/anxious and does not have insomnia.    Blood pressure 124/85, pulse 92,  temperature 98.2 F (36.8 C), temperature source Oral, resp. rate 18, height '5\' 2"'$  (1.575 m), weight 101.6 kg, last menstrual period 02/16/2022, SpO2 100 %. Body mass index is 40.97 kg/m.  Treatment Plan Summary: Daily contact with patient to assess and evaluate symptoms and progress in treatment and Medication management.   Continue inpatient hospitalization.  Will continue today 02/21/2022 plan as below except where it is noted.   Principal/active diagnoses.  Bipolar 1 disorder, manic, full remission (HCC) PTSD (post-traumatic stress disorder).    Upon her admission, patient requested to be re-started on her Invega, Vistaril, Minipress, Zyprexa zydis, Trazodone & Trileptal. And because patient has not been on her medications for few days due to her recent relapse & current use of cocaine/THC, she was started on a lower doses of these medications.  The plan is to gradually go up on the doses of these medication eventually as the drugs wear out of her system.  Plan:  -Completed Invega 3 mg po bid x 2 days for bipolar d/o  -Continue Invega 6 mg po daily for bipolar d/o (started on 02-21-22).  -Continue on Trileptal 150 mg po bid for mood stabilization.  -Continue on Minipress 1 mg po Q hs for PTSD symptoms.  -Decrease Trazodone from 50 mg to 25 mg po q hs prn for insomnia.  -Continue Vistaril 25 mg po tid prn for anxiety. -Continue Nicoderm patch 14 mg trans-dermally Q 24 hours for nicotine withdrawal.   Agitation protocols. -Continue Olanzapine 5 mg IM tid prn.  Or -Olanzapine zydis 5 mg po tid prn.   Other medical issues.  -Continue albuterol inhaler 2 puff Q hrs prn for SOB.  -Continue Proventil 2.5 mg nebulizer solution Q 6 hrs prn for severe SOB. -Continue Norvasc 5 mg po daily for HTN.  -Completed Clonidine 0.1 mg po x once today for elevated b/p: 130/103.  -Continue Os-cal 500 mg po daily for bone health.  -Continue Ferrous sulfate 325 mg po daily for anemia.  -Continue Flonase 2 spray per nare daily for allergies.  -Continue Breo Ellipta 1 puff daily for asthma. -Continue Nordette 1 tab po q hs for pregnancy prevention.  -Continue Protonix 40 mg po Q am for acid reflux.  -Continue Singulair 10 mg po daily for asthma.   Other PRNS -Continue Tylenol 650 mg every 6 hours PRN for mild pain -Continue Maalox 30 ml Q 4 hrs PRN for indigestion -Continue MOM 30 ml po Q 6 hrs for constipation   Safety and Monitoring: Voluntary admission to inpatient psychiatric unit for safety, stabilization and treatment Daily contact with patient to assess and evaluate symptoms and progress in treatment Patient's case to be discussed in multi-disciplinary team meeting Observation Level : q15 minute checks Vital signs: q12 hours Precautions: Safety   Discharge Planning: Social work and case management to assist with discharge planning  and identification of hospital follow-up needs prior to discharge Estimated LOS: 5-7 days Discharge Concerns: Need to establish a safety plan; Medication compliance and effectiveness Discharge Goals: Return home with outpatient referrals for mental health follow-up including medication management/psychotherapy   Nicholes Rough, NP, pmhnp, fnp-bc. 02/21/2022, 4:00 PMPatient ID: [name], female   DOB: 1985-05-12, 37 y.o.   MRN: [id number]

## 2022-02-21 NOTE — Progress Notes (Signed)
[facility] Notes:  (Nursing/MHT/Case Management/Adjunct)  Date:  02/21/2022  Time:  2000  Type of Therapy:   wrap up group  Participation Level:  Active  Participation Quality:  Appropriate, Attentive, Sharing, and Supportive  Affect:  Appropriate  Cognitive:  Alert  Insight:  Improving  Engagement in Group:  Engaged  Modes of Intervention:  Clarification, Education, and Support  Summary of Progress/Problems: Positive thinking and positive change were discussed.   Shellia Cleverly 02/21/2022, 11:31 PM

## 2022-02-22 MED ORDER — FLUTICASONE FUROATE-VILANTEROL 200-25 MCG/ACT IN AEPB: 1.0000 | INHALATION_SPRAY | Freq: Every day | RESPIRATORY_TRACT | 0 refills | Status: DC

## 2022-02-22 MED ORDER — HYDROXYZINE HCL 50 MG PO TABS: 50.0000 mg | ORAL_TABLET | Freq: Three times a day (TID) | ORAL | 0 refills | Status: DC | PRN

## 2022-02-22 MED ORDER — CALCIUM CARBONATE 1250 (500 CA) MG PO TABS: 1.0000 | ORAL_TABLET | Freq: Every day | ORAL | 0 refills | Status: AC

## 2022-02-22 MED ORDER — MONTELUKAST SODIUM 10 MG PO TABS: 10.0000 mg | ORAL_TABLET | Freq: Every day | ORAL | 0 refills | Status: DC

## 2022-02-22 MED ORDER — PALIPERIDONE ER 6 MG PO TB24: 6.0000 mg | ORAL_TABLET | Freq: Every day | ORAL | 0 refills | Status: DC

## 2022-02-22 MED ORDER — PANTOPRAZOLE SODIUM 40 MG PO TBEC: 40.0000 mg | DELAYED_RELEASE_TABLET | Freq: Every day | ORAL | 0 refills | Status: DC

## 2022-02-22 MED ORDER — PRAZOSIN HCL 1 MG PO CAPS: 1.0000 mg | ORAL_CAPSULE | Freq: Every day | ORAL | 0 refills | Status: DC

## 2022-02-22 MED ORDER — FERROUS SULFATE 325 (65 FE) MG PO TABS: 325.0000 mg | ORAL_TABLET | Freq: Every day | ORAL | 0 refills | Status: AC

## 2022-02-22 MED ORDER — OXCARBAZEPINE 150 MG PO TABS: 150.0000 mg | ORAL_TABLET | Freq: Two times a day (BID) | ORAL | 0 refills | Status: DC

## 2022-02-22 MED ORDER — NICOTINE 14 MG/24HR TD PT24: 14.0000 mg | MEDICATED_PATCH | Freq: Every day | TRANSDERMAL | 0 refills | Status: DC

## 2022-02-22 MED ORDER — AMLODIPINE BESYLATE 5 MG PO TABS: 5.0000 mg | ORAL_TABLET | Freq: Every day | ORAL | 0 refills | Status: DC

## 2022-02-22 MED ORDER — GABAPENTIN 100 MG PO CAPS: 100.0000 mg | ORAL_CAPSULE | Freq: Three times a day (TID) | ORAL | 0 refills | Status: DC

## 2022-02-22 MED ORDER — TRAZODONE HCL 50 MG PO TABS: 25.0000 mg | ORAL_TABLET | Freq: Every evening | ORAL | 0 refills | Status: DC | PRN

## 2022-02-22 NOTE — Progress Notes (Signed)
Patient discharged with all belongings and AVS paperwork. Patient was counseled on her appointments but she was displeased her therapy appointment was not made for her. She stated she did not want to just wait around all day for walk in services at [facility]. Educated patient on medications and importance of medication compliance. Pt had no questions.

## 2022-02-22 NOTE — Progress Notes (Signed)
   02/22/22 0000  Psych Admission Type (Psych Patients Only)  Admission Status Voluntary  Psychosocial Assessment  Patient Complaints Anxiety  Eye Contact Fair  Facial Expression Anxious  Affect Anxious  Speech Logical/coherent  Interaction Assertive  Motor Activity Fidgety  Appearance/Hygiene Unremarkable  Behavior Characteristics Cooperative  Mood Anxious  Aggressive Behavior  Effect No apparent injury  Thought Process  Coherency WDL  Content WDL  Delusions None reported or observed  Perception WDL  Hallucination None reported or observed  Judgment Limited  Confusion None  Danger to Self  Current suicidal ideation? Denies  Agreement Not to Harm Self Yes  Description of Agreement Verbal  Danger to Others  Danger to Others None reported or observed   Alert/oriented. Makes needs/concerns known to staff. Pleasant cooperative with staff. Denies SI/HI/A/V hallucinations. Med compliant. PRN med given with good effect. Patient states went to group and writes as coping strategies. Will encourage continue compliance and progression towards goals. Verbally contracted for safety. Will continue to monitor. Patient states "I'm going home tomorrow"

## 2022-02-22 NOTE — Plan of Care (Signed)
  Problem: Safety: Goal: Periods of time without injury will increase Outcome: Progressing

## 2022-02-22 NOTE — Discharge Summary (Signed)
Physician Discharge Summary Note  Patient:  [name] is an 37 y.o., female MRN:  [id number] DOB:  Dec 17, 1985 Patient phone:  [phone] (home)  Patient address:   [address] [place] [address],  Total Time spent with patient: 30 minutes  Date of Admission:  02/18/2022 Date of Discharge: 02/22/2022  Reason for Admission:   37 year old Caucasian female with hx of mental illnesses, substance use disorders & multiple psychiatric admissions/treatments. She is known in this [facility] from her previous admissions since her childhood years. Chart review indicated previous hx/tx for bipolar disorder, major depressive disorder & PTSD. She was a recent patient in this [facility] from October 12th thru October 16th, 2023 for the management of worsening symptoms of bipolar disorder. Upon stabilization of symptoms/discharge at the time, she was recommended to continue routine psychiatric care & medication management on an outpatient basis. [name] is being admitted to the [facility] this time around with complaint of intrusive thoughts of self-harm with plan to cut on herself or overdose on medications.     Principal Problem: Bipolar 1 disorder, manic, full remission ([place]) Discharge Diagnoses: Principal Problem:   Bipolar 1 disorder, manic, full remission ([place]) Active Problems:   PTSD (post-traumatic stress disorder)  Past Psychiatric History: As above  Past Medical History:  Past Medical History:  Diagnosis Date   Allergy    Anxiety    Asthma    Bipolar 2 disorder ([place])    Borderline personality disorder ([place])    Depression    Eczema    Former smoker    Obesity    PTSD (post-traumatic stress disorder)    Urticaria     Past Surgical History:  Procedure Laterality Date   Thumb surgery Left    Family History:  Family History  Problem Relation Age of Onset   Healthy Mother    Healthy Father    Alzheimer's disease Maternal Grandmother    Skin cancer Paternal Grandmother    Allergic rhinitis  Paternal Grandmother    Asthma Paternal Grandmother    Angioedema Neg Hx    Eczema Neg Hx    Urticaria Neg Hx    Family Psychiatric  History: As above Social History:  Social History   Substance and Sexual Activity  Alcohol Use Yes   Alcohol/week: 6.0 - 12.0 standard drinks of alcohol   Types: 6 - 12 Cans of beer per week     Social History   Substance and Sexual Activity  Drug Use Yes   Frequency: 4.0 times per week   Types: Marijuana, "Crack" cocaine   Comment: Hx of opioid dependency    Social History   Socioeconomic History   Marital status: Single    Spouse name: Not on file   Number of children: 0   Years of education: 13   Highest education level: Not on file  Occupational History   Not on file  Tobacco Use   Smoking status: Every Day    Packs/day: 1.00    Years: 9.00    Total pack years: 9.00    Types: Cigarettes    Passive exposure: Current   Smokeless tobacco: Never  Vaping Use   Vaping Use: Former  Substance and Sexual Activity   Alcohol use: Yes    Alcohol/week: 6.0 - 12.0 standard drinks of alcohol    Types: 6 - 12 Cans of beer per week   Drug use: Yes    Frequency: 4.0 times per week    Types: Marijuana, "Crack"  cocaine    Comment: Hx of opioid dependency   Sexual activity: Yes    Partners: Female, Female    Birth control/protection: Pill    Comment: First IC @ 41, No Hx of STIs  Other Topics Concern   Not on file  Social History Narrative   Fun: Play video games and watch movies.   Denies abuse and feels safe at home.   Social Determinants of Health   Financial Resource Strain: Not on file  Food Insecurity: No Food Insecurity (02/18/2022)   Hunger Vital Sign    Worried About Running Out of Food in the Last Year: Never true    Ran Out of Food in the Last Year: Never true  Transportation Needs: No Transportation Needs (02/18/2022)   PRAPARE - Hydrologist (Medical): No    Lack of Transportation (Non-Medical):  No  Physical Activity: Not on file  Stress: Not on file  Social Connections: Not on file                                                     HOSPITAL COURSE During the patient's hospitalization, patient had extensive initial psychiatric evaluation, and follow-up psychiatric evaluations every day. Psychiatric diagnoses provided upon initial assessment. Patient's psychiatric medications were adjusted on admission as follows: -Restarted on Invega 3 mg po bid x 2 days for bipolar d/o  -Increased Invega from 3 mg to Invega 6 mg po daily for bipolar d/o  -Restarted on Trileptal 150 mg po bid for mood stabilization.  -Restarted on Minipress 1 mg po Q hs for PTSD symptoms.  -Continued trazodone 50 mg po q hs prn for insomnia.  -Continued Vistaril 25 mg po tid prn for anxiety. -Continued Nicoderm patch 14 mg trans-dermally Q 24 hours for nicotine withdrawal.   During the hospitalization, other adjustments were made to the patient's psychiatric medication regimen. Medications at discharge are as follows: -Continue Invega 6 mg po daily for bipolar d/o -Continue on Trileptal 150 mg po bid for mood stabilization.  -Continue on Minipress 1 mg po Q hs for PTSD symptoms.  -Continue Trazodone 25 mg po q hs prn for insomnia.  -Continue Vistaril 25 mg po tid prn for anxiety. -Continue Nicoderm patch 14 mg trans-dermally Q 24 hours for nicotine addiction Other medical issues.  -Continue albuterol inhaler 2 puff Q hrs prn for SOB.  -Continue Proventil 2.5 mg nebulizer solution Q 6 hrs prn for severe SOB. -Continue Norvasc 5 mg po daily for HTN.  -Continue Os-cal 500 mg po daily for bone health.  -Continue Ferrous sulfate 325 mg po daily for anemia.  -Continue Flonase 2 spray per nare daily for allergies.  -Continue Breo Ellipta 1 puff daily for asthma. -Continue Nordette 1 tab po q hs for pregnancy prevention.  -Continue Protonix 40 mg po Q am for acid reflux.  -Continue Singulair 10 mg po daily for  asthma.   Patient's care was discussed during the interdisciplinary team meeting every day during the hospitalization. The patient denies having side effects to prescribed psychiatric medication. Gradually, patient started adjusting to milieu. The patient was evaluated each day by a clinical provider to ascertain response to treatment. Improvement was noted by the patient's report of decreasing symptoms, improved sleep and appetite, affect, medication tolerance, behavior, and participation in unit programming.  Patient was asked each day to complete a self inventory noting mood, mental status, pain, new symptoms, anxiety and concerns. Symptoms were reported as significantly decreased or resolved completely by discharge.    On day of discharge, the patient reports that their mood is stable. The patient denied having suicidal thoughts for more than 48 hours prior to discharge.  Patient denies having homicidal thoughts.  Patient denies having auditory hallucinations.  Patient denies any visual hallucinations or other symptoms of psychosis. The patient was motivated to continue taking medication with a goal of continued improvement in mental health. The patient reports their target psychiatric symptoms of depression, anxiety and insomnia responded well to the psychiatric medications, and the patient reports overall benefit other psychiatric hospitalization. Supportive psychotherapy was provided to the patient. The patient also participated in regular group therapy while hospitalized. Coping skills, problem solving as well as relaxation therapies were also part of the unit programming.   Labs were reviewed with the patient, and abnormal results were discussed with the patient. BMP WNL. TSH WNL. Lipid panel with cholesterol slightly elevated at 218, LDL elevated at 147. Educated on healthy food choices and exercise. Ha1c WNL at 5.2. urine pregnancy test negative. Tox screen positive for cocaine and THC. Educated on  the negative impact on substances of abuse on her mental health on on the need to cease using. CBC WNL. EKG with Qtc 425.   The patient is able to verbalize their individual safety plan to this provider.   # It is recommended to the patient to continue psychiatric medications as prescribed, after discharge from the hospital.     # It is recommended to the patient to follow up with your outpatient psychiatric provider and PCP.   # It was discussed with the patient, the impact of alcohol, drugs, tobacco have been there overall psychiatric and medical wellbeing, and total abstinence from substance use was recommended the patient.ed.   # Prescriptions were sent directly to preferred pharmacy at discharge. Patient agreeable to plan. Given opportunity to ask questions. Appears to feel comfortable with discharge.    # In the event of worsening symptoms, the patient is instructed to call the crisis hotline (988), 911 and or go to the nearest ED for appropriate evaluation and treatment of symptoms. To follow-up with primary care provider for other medical issues, concerns and or health care needs   # Patient was discharged home with a plan to follow up as noted below.   Physical Findings: AIMS: Facial and Oral Movements Muscles of Facial Expression: None, normal Lips and Perioral Area: None, normal Jaw: None, normal Tongue: None, normal,Extremity Movements Upper (arms, wrists, hands, fingers): None, normal Lower (legs, knees, ankles, toes): None, normal, Trunk Movements Neck, shoulders, hips: None, normal, Overall Severity Severity of abnormal movements (highest score from questions above): None, normal Incapacitation due to abnormal movements: None, normal Patient's awareness of abnormal movements (rate only patient's report): No Awareness, Dental Status Current problems with teeth and/or dentures?: No Does patient usually wear dentures?: No   CIWA:    COWS:    AIMS:  0  Musculoskeletal: Strength & Muscle Tone: within normal limits Gait & Station: normal Patient leans: N/A  Psychiatric Specialty Exam:  Presentation  General Appearance:  Fairly Groomed  Eye Contact: Good  Speech: Clear and Coherent  Speech Volume: Normal  Handedness: Right   Mood and Affect  Mood: Euthymic  Affect: Congruent   Thought Process  Thought Processes: Coherent  Descriptions of Associations:Intact  Orientation:Full (Time, Place and Person)  Thought Content:Logical  History of Schizophrenia/Schizoaffective disorder:No  Duration of Psychotic Symptoms:No data recorded Hallucinations:Hallucinations: None  Ideas of Reference:None  Suicidal Thoughts:Suicidal Thoughts: No  Homicidal Thoughts:Homicidal Thoughts: No   Sensorium  Memory: Immediate Good  Judgment: Good  Insight: Good   Executive Functions  Concentration: Good  Attention Span: Good  Recall: Good  Fund of Knowledge: Good  Language: Good   Psychomotor Activity  Psychomotor Activity: Psychomotor Activity: Normal   Assets  Assets: Communication Skills   Sleep  Sleep: Sleep: Good    Physical Exam: Physical Exam Constitutional:      Appearance: She is obese.  HENT:     Head: Normocephalic.  Pulmonary:     Effort: Pulmonary effort is normal.  Neurological:     Mental Status: She is alert and oriented to person, place, and time.    Review of Systems  Constitutional: Negative.  Negative for fever.  HENT: Negative.    Eyes: Negative.   Respiratory: Negative.    Cardiovascular: Negative.   Gastrointestinal: Negative.   Genitourinary: Negative.   Musculoskeletal: Negative.   Skin: Negative.   Neurological: Negative.   Psychiatric/Behavioral:  Positive for depression (Denies SI, denies HI, denies AVH, denies paranoia, verbally contracts for safety outside of this [facility]) and substance abuse (educated on the negative impact of substance use  on her mental health and educated on cessation). Negative for hallucinations, memory loss and suicidal ideas. The patient is nervous/anxious (resolving on current medications) and has insomnia (resolving on current medications).    Blood pressure 108/77, pulse (!) 112, temperature 98.2 F (36.8 C), temperature source Oral, resp. rate (!) 22, height '5\' 2"'$  (1.575 m), weight 101.6 kg, last menstrual period 02/16/2022, SpO2 100 %. Body mass index is 40.97 kg/m.  Social History   Tobacco Use  Smoking Status Every Day   Packs/day: 1.00   Years: 9.00   Total pack years: 9.00   Types: Cigarettes   Passive exposure: Current  Smokeless Tobacco Never   Tobacco Cessation:  A prescription for an FDA-approved tobacco cessation medication provided at discharge  Blood Alcohol level:  Lab Results  Component Value Date   ETH <10 02/17/2022   ETH 132 (H) 81/02/7508   Metabolic Disorder Labs:  Lab Results  Component Value Date   HGBA1C 5.2 02/19/2022   MPG 102.54 02/19/2022   MPG 111 03/05/2021   No results found for: "PROLACTIN" Lab Results  Component Value Date   CHOL 218 (H) 02/19/2022   TRIG 84 02/19/2022   HDL 54 02/19/2022   CHOLHDL 4.0 02/19/2022   VLDL 17 02/19/2022   LDLCALC 147 (H) 02/19/2022   [place] 99 11/12/2021   See Psychiatric Specialty Exam and Suicide Risk Assessment completed by Attending Physician prior to discharge.  Discharge destination:  Home  Is patient on multiple antipsychotic therapies at discharge:  No   Has Patient had three or more failed trials of antipsychotic monotherapy by history:  No  Recommended Plan for Multiple Antipsychotic Therapies: NA  Allergies as of 02/22/2022       Reactions   Benztropine Other (See Comments)   agitation   Other Other (See Comments)   Opiates- history of dependency    Lamictal [lamotrigine] Rash        Medication List     STOP taking these medications    amoxicillin-clavulanate 875-125 MG  tablet Commonly known as: AUGMENTIN   budesonide-formoterol 160-4.5 MCG/ACT inhaler Commonly known as: Symbicort Replaced by:  fluticasone furoate-vilanterol 200-25 MCG/ACT Aepb   doxycycline 100 MG capsule Commonly known as: VIBRAMYCIN   fluconazole 150 MG tablet Commonly known as: DIFLUCAN   fluticasone 50 MCG/ACT nasal spray Commonly known as: FLONASE   hydrOXYzine 50 MG capsule Commonly known as: VISTARIL   multivitamin with minerals Tabs tablet   OLANZapine zydis 5 MG disintegrating tablet Commonly known as: ZyPREXA Zydis   omeprazole 40 MG capsule Commonly known as: PRILOSEC Replaced by: pantoprazole 40 MG tablet   POTASSIMIN PO   predniSONE 10 MG tablet Commonly known as: DELTASONE   THIAMINE HCL PO   triamcinolone cream 0.1 % Commonly known as: KENALOG       TAKE these medications      Indication  albuterol 108 (90 Base) MCG/ACT inhaler Commonly known as: VENTOLIN HFA Inhale 2 puffs into the lungs every 6 (six) hours as needed for wheezing or shortness of breath. What changed: Another medication with the same name was removed. Continue taking this medication, and follow the directions you see here.  Indication: Asthma   amLODipine 5 MG tablet Commonly known as: NORVASC Take 1 tablet (5 mg total) by mouth daily. Start taking on: February 23, 2022  Indication: High Blood Pressure Disorder   calcium carbonate 1250 (500 Ca) MG tablet Commonly known as: OS-CAL - dosed in mg of elemental calcium Take 1 tablet (1,250 mg total) by mouth daily with breakfast. Start taking on: February 23, 2022  Indication: Supplementation   ferrous sulfate 325 (65 FE) MG tablet Take 1 tablet (325 mg total) by mouth daily. Start taking on: February 23, 2022 What changed:  medication strength how much to take  Indication: iron deficiency   fluticasone furoate-vilanterol 200-25 MCG/ACT Aepb Commonly known as: BREO ELLIPTA Inhale 1 puff into the lungs daily. Start  taking on: February 23, 2022 Replaces: budesonide-formoterol 160-4.5 MCG/ACT inhaler  Indication: Asthma   gabapentin 100 MG capsule Commonly known as: NEURONTIN Take 1 capsule (100 mg total) by mouth every 8 (eight) hours.  Indication: Generalized Anxiety Disorder   hydrOXYzine 50 MG tablet Commonly known as: ATARAX Take 1 tablet (50 mg total) by mouth 3 (three) times daily as needed for anxiety.  Indication: Feeling Anxious   levonorgestrel-ethinyl estradiol 0.15-30 MG-MCG tablet Commonly known as: NORDETTE Take 1 tablet by mouth at bedtime.  Indication: Birth Control Treatment   montelukast 10 MG tablet Commonly known as: SINGULAIR Take 1 tablet (10 mg total) by mouth at bedtime. What changed: See the new instructions.  Indication: Asthma   nicotine 14 mg/24hr patch Commonly known as: NICODERM CQ - dosed in mg/24 hours Place 1 patch (14 mg total) onto the skin daily. Start taking on: February 23, 2022  Indication: Nicotine Addiction   OXcarbazepine 150 MG tablet Commonly known as: TRILEPTAL Take 1 tablet (150 mg total) by mouth every 12 (twelve) hours. What changed:  medication strength how much to take when to take this  Indication: bipolar disorder   paliperidone 6 MG 24 hr tablet Commonly known as: INVEGA Take 1 tablet (6 mg total) by mouth daily. Start taking on: February 23, 2022 What changed: when to take this  Indication: Mood stabilization   pantoprazole 40 MG tablet Commonly known as: PROTONIX Take 1 tablet (40 mg total) by mouth daily. Start taking on: February 23, 2022 Replaces: omeprazole 40 MG capsule  Indication: Gastroesophageal Reflux Disease   prazosin 1 MG capsule Commonly known as: MINIPRESS Take 1 capsule (1 mg total) by mouth daily. What changed: Another  medication with the same name was removed. Continue taking this medication, and follow the directions you see here.  Indication: Frightening Dreams   traZODone 50 MG tablet Commonly  known as: DESYREL Take 0.5 tablets (25 mg total) by mouth at bedtime as needed for sleep. What changed: how much to take  Indication: [place] Go to.   Specialty: Professional Counselor Why: Please go to this provider for therapy services during walk in hours for new patients on Monday through Friday, from 9:00 am to 1:00 pm.  No appointment is necessary. Contact information: [place] [phone] [place] [facility] Follow up on 03/11/2022.   Specialty: Behavioral Health Why: You have an appointment for medication management services on 03/11/22 at 9:00 am.  This will be a Virtual visit via  [facility] visit. Contact information: [place] [phone]               Signed: Nicholes Rough, NP 02/22/2022, 12:01 PM

## 2022-02-22 NOTE — [facility] Group Notes (Signed)
Adult Psychoeducational Group Note  Date:  02/22/2022 Time:  10:17 AM  Group Topic/Focus:  Emotional Education:   The focus of this group is to discuss what feelings/emotions are, and how they are experienced.  Participation Level:  Active  Participation Quality:  Appropriate  Affect:  Appropriate  Cognitive:  Appropriate  Insight: Appropriate  Engagement in Group:  Engaged  Modes of Intervention:  Education  Additional Comments: Group was " Don't make Assumptions"   Camila Li 02/22/2022, 10:17 AM

## 2022-02-22 NOTE — BHH Suicide Risk Assessment (Signed)
Suicide Risk Assessment  Discharge Assessment    [facility] Discharge Suicide Risk Assessment   Principal Problem: Bipolar 1 disorder, manic, full remission ([place]) Discharge Diagnoses: Principal Problem:   Bipolar 1 disorder, manic, full remission ([place]) Active Problems:   PTSD (post-traumatic stress disorder)  Reason for admission: 37 year old Caucasian female with hx of mental illnesses, substance use disorders & multiple psychiatric admissions/treatments. She is known in this [facility] from her previous admissions since her childhood years. Chart review indicated previous hx/tx for bipolar disorder, major depressive disorder & PTSD. She was a recent patient in this [facility] from October 12th thru October 16th, 2023 for the management of worsening symptoms of bipolar disorder. Upon stabilization of symptoms/discharge at the time, she was recommended to continue routine psychiatric care & medication management on an outpatient basis. [name] is being admitted to the [facility] this time around with complaint of intrusive thoughts of self-harm with plan to cut on herself or overdose on medications.                                                  HOSPITAL COURSE During the patient's hospitalization, patient had extensive initial psychiatric evaluation, and follow-up psychiatric evaluations every day. Psychiatric diagnoses provided upon initial assessment. Patient's psychiatric medications were adjusted on admission as follows: -Restarted on Invega 3 mg po bid x 2 days for bipolar d/o  -Increased Invega from 3 mg to Invega 6 mg po daily for bipolar d/o  -Restarted on Trileptal 150 mg po bid for mood stabilization.  -Restarted on Minipress 1 mg po Q hs for PTSD symptoms.  -Continued trazodone 50 mg po q hs prn for insomnia.  -Continued Vistaril 25 mg po tid prn for anxiety. -Continued Nicoderm patch 14 mg trans-dermally Q 24 hours for nicotine withdrawal.  During the hospitalization, other adjustments were made to the  patient's psychiatric medication regimen. Medications at discharge are as follows: -Continue Invega 6 mg po daily for bipolar d/o -Continue on Trileptal 150 mg po bid for mood stabilization.  -Continue on Minipress 1 mg po Q hs for PTSD symptoms.  -Continue Trazodone 25 mg po q hs prn for insomnia.  -Continue Vistaril 25 mg po tid prn for anxiety. -Continue Nicoderm patch 14 mg trans-dermally Q 24 hours for nicotine addiction Other medical issues.  -Continue albuterol inhaler 2 puff Q hrs prn for SOB.  -Continue Proventil 2.5 mg nebulizer solution Q 6 hrs prn for severe SOB. -Continue Norvasc 5 mg po daily for HTN.  -Continue Os-cal 500 mg po daily for bone health.  -Continue Ferrous sulfate 325 mg po daily for anemia.  -Continue Flonase 2 spray per nare daily for allergies.  -Continue Breo Ellipta 1 puff daily for asthma. -Continue Nordette 1 tab po q hs for pregnancy prevention.  -Continue Protonix 40 mg po Q am for acid reflux.  -Continue Singulair 10 mg po daily for asthma.  Patient's care was discussed during the interdisciplinary team meeting every day during the hospitalization. The patient denies having side effects to prescribed psychiatric medication. Gradually, patient started adjusting to milieu. The patient was evaluated each day by a clinical provider to ascertain response to treatment. Improvement was noted by the patient's report of decreasing symptoms, improved sleep and appetite, affect, medication tolerance, behavior, and participation in unit programming.  Patient was asked each day to complete  a self inventory noting mood, mental status, pain, new symptoms, anxiety and concerns. Symptoms were reported as significantly decreased or resolved completely by discharge.   On day of discharge, the patient reports that their mood is stable. The patient denied having suicidal thoughts for more than 48 hours prior to discharge.  Patient denies having homicidal thoughts.  Patient  denies having auditory hallucinations.  Patient denies any visual hallucinations or other symptoms of psychosis. The patient was motivated to continue taking medication with a goal of continued improvement in mental health. The patient reports their target psychiatric symptoms of depression, anxiety and insomnia responded well to the psychiatric medications, and the patient reports overall benefit other psychiatric hospitalization. Supportive psychotherapy was provided to the patient. The patient also participated in regular group therapy while hospitalized. Coping skills, problem solving as well as relaxation therapies were also part of the unit programming.  Labs were reviewed with the patient, and abnormal results were discussed with the patient. BMP WNL. TSH WNL. Lipid panel with cholesterol slightly elevated at 218, LDL elevated at 147. Educated on healthy food choices and exercise. Ha1c WNL at 5.2. urine pregnancy test negative. Tox screen positive for cocaine and THC. Educated on the negative impact on substances of abuse on her mental health on on the need to cease using. CBC WNL. EKG with Qtc 425.  The patient is able to verbalize their individual safety plan to this provider.  # It is recommended to the patient to continue psychiatric medications as prescribed, after discharge from the hospital.    # It is recommended to the patient to follow up with your outpatient psychiatric provider and PCP.  # It was discussed with the patient, the impact of alcohol, drugs, tobacco have been there overall psychiatric and medical wellbeing, and total abstinence from substance use was recommended the patient.ed.  # Prescriptions were sent directly to preferred pharmacy at discharge. Patient agreeable to plan. Given opportunity to ask questions. Appears to feel comfortable with discharge.    # In the event of worsening symptoms, the patient is instructed to call the crisis hotline (988), 911 and or go to  the nearest ED for appropriate evaluation and treatment of symptoms. To follow-up with primary care provider for other medical issues, concerns and or health care needs  # Patient was discharged home with a plan to follow up as noted below.   Total Time spent with patient: 30 minutes  Musculoskeletal: Strength & Muscle Tone: within normal limits Gait & Station: normal Patient leans: N/A  Psychiatric Specialty Exam  Presentation  General Appearance:  Fairly Groomed  Eye Contact: Good  Speech: Clear and Coherent  Speech Volume: Normal  Handedness: Right   Mood and Affect  Mood: Euthymic  Duration of Depression Symptoms: Less than two weeks  Affect: Congruent   Thought Process  Thought Processes: Coherent  Descriptions of Associations:Intact  Orientation:Full (Time, Place and Person)  Thought Content:Logical  History of Schizophrenia/Schizoaffective disorder:No  Duration of Psychotic Symptoms:No data recorded Hallucinations:Hallucinations: None  Ideas of Reference:None  Suicidal Thoughts:Suicidal Thoughts: No  Homicidal Thoughts:Homicidal Thoughts: No   Sensorium  Memory: Immediate Good  Judgment: Good  Insight: Good   Executive Functions  Concentration: Good  Attention Span: Good  Recall: Good  Fund of Knowledge: Good  Language: Good   Psychomotor Activity  Psychomotor Activity: Psychomotor Activity: Normal   Assets  Assets: Communication Skills   Sleep  Sleep: Sleep: Good   Physical Exam: Physical Exam Constitutional:  Appearance: Normal appearance.  HENT:     Nose: Nose normal. No congestion.  Eyes:     Pupils: Pupils are equal, round, and reactive to light.  Pulmonary:     Effort: Pulmonary effort is normal.  Musculoskeletal:        General: Normal range of motion.     Cervical back: Normal range of motion.  Neurological:     Mental Status: She is alert and oriented to person, place, and time.   Psychiatric:        Thought Content: Thought content normal.    Review of Systems  Constitutional:  Negative for fever.  HENT: Negative.    Eyes: Negative.   Respiratory: Negative.  Negative for cough.   Cardiovascular: Negative.   Gastrointestinal: Negative.  Negative for heartburn.  Genitourinary: Negative.   Skin: Negative.   Neurological: Negative.   Psychiatric/Behavioral:  Positive for depression (Denies SI, denies HI, denies AVH, verbally contracts for safety outside of this [facility]) and substance abuse (Educated on the negative impact of substance use on her mental health and on the need to cease using and verbalizes understanding). Negative for hallucinations, memory loss and suicidal ideas. The patient is nervous/anxious (resolving on current medications) and has insomnia (resolved on current medications).    Blood pressure 108/77, pulse (!) 112, temperature 98.2 F (36.8 C), temperature source Oral, resp. rate (!) 22, height '5\' 2"'$  (1.575 m), weight 101.6 kg, last menstrual period 02/16/2022, SpO2 100 %. Body mass index is 40.97 kg/m.  Mental Status Per Nursing Assessment::   On Admission:  Suicidal ideation indicated by patient, Self-harm thoughts, Suicide plan  Demographic Factors:  Caucasian  Loss Factors: NA  Historical Factors: Impulsivity  Risk Reduction Factors:   Positive social support  Continued Clinical Symptoms:  None  Cognitive Features That Contribute To Risk:  None    Suicide Risk:  Mild:  There are no identifiable suicide plans, no associated intent, mild dysphoria and related symptoms, good self-control (both objective and subjective assessment), few other risk factors, and identifiable protective factors, including available and accessible social support.    Follow-up Information     [place] Go to.   Specialty: Professional Counselor Why: Please go to this provider for therapy services during walk in hours for new  patients on Monday through Friday, from 9:00 am to 1:00 pm.  No appointment is necessary. Contact information: [place] [id number] [place] [facility] Follow up on 03/11/2022.   Specialty: Behavioral Health Why: You have an appointment for medication management services on 03/11/22 at 9:00 am.  This will be a Virtual visit via  [place] visit. Contact information: [place], NP 02/22/2022, 9:14 AM

## 2022-02-22 NOTE — Plan of Care (Signed)
  Problem: Education: Goal: Knowledge of [facility] Education information/materials will improve Outcome: Adequate for Discharge Goal: Emotional status will improve Outcome: Adequate for Discharge Goal: Mental status will improve Outcome: Adequate for Discharge Goal: Verbalization of understanding the information provided will improve Outcome: Adequate for Discharge   Problem: Activity: Goal: Interest or engagement in activities will improve Outcome: Adequate for Discharge Goal: Sleeping patterns will improve Outcome: Adequate for Discharge   Problem: Coping: Goal: Ability to verbalize frustrations and anger appropriately will improve Outcome: Adequate for Discharge Goal: Ability to demonstrate self-control will improve Outcome: Adequate for Discharge   Problem: Health Behavior/Discharge Planning: Goal: Identification of resources available to assist in meeting health care needs will improve Outcome: Adequate for Discharge Goal: Compliance with treatment plan for underlying cause of condition will improve Outcome: Adequate for Discharge   Problem: Physical Regulation: Goal: Ability to maintain clinical measurements within normal limits will improve Outcome: Adequate for Discharge   Problem: Safety: Goal: Periods of time without injury will increase Outcome: Adequate for Discharge

## 2022-02-23 ENCOUNTER — Telehealth (HOSPITAL_COMMUNITY): Payer: Self-pay

## 2022-02-23 NOTE — Telephone Encounter (Signed)
Prior Authorization done for Invega Tab '6mg'$  Approved through 02/24/2023 Pharmacy informed by fax

## 2022-03-04 ENCOUNTER — Other Ambulatory Visit (HOSPITAL_COMMUNITY): Payer: Self-pay

## 2022-03-11 ENCOUNTER — Encounter (HOSPITAL_COMMUNITY): Payer: Self-pay | Admitting: Psychiatry

## 2022-03-11 ENCOUNTER — Telehealth (INDEPENDENT_AMBULATORY_CARE_PROVIDER_SITE_OTHER): Payer: Medicaid Other | Admitting: Psychiatry

## 2022-03-11 DIAGNOSIS — F419 Anxiety disorder, unspecified: Secondary | ICD-10-CM | POA: Diagnosis not present

## 2022-03-11 DIAGNOSIS — F3181 Bipolar II disorder: Secondary | ICD-10-CM | POA: Diagnosis not present

## 2022-03-11 DIAGNOSIS — F4310 Post-traumatic stress disorder, unspecified: Secondary | ICD-10-CM

## 2022-03-11 DIAGNOSIS — F603 Borderline personality disorder: Secondary | ICD-10-CM | POA: Diagnosis not present

## 2022-03-11 MED ORDER — PALIPERIDONE ER 6 MG PO TB24: 6.0000 mg | ORAL_TABLET | Freq: Every day | ORAL | 1 refills | Status: DC

## 2022-03-11 MED ORDER — TRAZODONE HCL 50 MG PO TABS: 25.0000 mg | ORAL_TABLET | Freq: Every evening | ORAL | 1 refills | Status: DC | PRN

## 2022-03-11 MED ORDER — PRAZOSIN HCL 1 MG PO CAPS: 1.0000 mg | ORAL_CAPSULE | Freq: Every day | ORAL | 1 refills | Status: DC

## 2022-03-11 MED ORDER — HYDROXYZINE HCL 50 MG PO TABS: 50.0000 mg | ORAL_TABLET | Freq: Two times a day (BID) | ORAL | 1 refills | Status: DC | PRN

## 2022-03-11 MED ORDER — OXCARBAZEPINE 300 MG PO TABS: 300.0000 mg | ORAL_TABLET | Freq: Two times a day (BID) | ORAL | 1 refills | Status: AC

## 2022-03-11 MED ORDER — OLANZAPINE 5 MG PO TBDP: 5.0000 mg | ORAL_TABLET | Freq: Every day | ORAL | 0 refills | Status: DC

## 2022-03-11 MED ORDER — GABAPENTIN 100 MG PO CAPS: 100.0000 mg | ORAL_CAPSULE | Freq: Three times a day (TID) | ORAL | 0 refills | Status: DC
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
## 2022-03-12 ENCOUNTER — Other Ambulatory Visit (HOSPITAL_COMMUNITY): Payer: Self-pay

## 2022-03-13 ENCOUNTER — Telehealth (HOSPITAL_COMMUNITY): Payer: Self-pay | Admitting: *Deleted

## 2022-03-13 MED ORDER — PRAZOSIN HCL 1 MG PO CAPS: 1.0000 mg | ORAL_CAPSULE | ORAL | 1 refills | Status: DC

## 2022-03-13 NOTE — Telephone Encounter (Signed)
Patient called stated that her medication --  prazosin (MINIPRESS) 1 MG capsule Take 1 capsule (1 mg total) by mouth daily.  Was sent incorrectly that she has been taking 1 capsule in the AM  & 3 capsules @ HS for a total of 120 capsules  [place], [place]   Next appt 04/20/22 Last appt 03/11/21

## 2022-03-13 NOTE — Addendum Note (Signed)
Addended by: Merian Capron on: 03/13/2022 10:57 AM   Modules accepted: Orders

## 2022-03-20 ENCOUNTER — Ambulatory Visit (INDEPENDENT_AMBULATORY_CARE_PROVIDER_SITE_OTHER): Payer: Medicaid Other | Admitting: Internal Medicine

## 2022-03-20 VITALS — BP 116/62 | HR 91 | Temp 97.9°F | Ht 62.0 in | Wt 242.0 lb

## 2022-03-20 DIAGNOSIS — Z0001 Encounter for general adult medical examination with abnormal findings: Secondary | ICD-10-CM | POA: Diagnosis not present

## 2022-03-20 DIAGNOSIS — F17200 Nicotine dependence, unspecified, uncomplicated: Secondary | ICD-10-CM | POA: Insufficient documentation

## 2022-03-20 DIAGNOSIS — N760 Acute vaginitis: Secondary | ICD-10-CM | POA: Diagnosis not present

## 2022-03-20 DIAGNOSIS — I10 Essential (primary) hypertension: Secondary | ICD-10-CM

## 2022-03-20 DIAGNOSIS — L03211 Cellulitis of face: Secondary | ICD-10-CM

## 2022-03-20 DIAGNOSIS — F3130 Bipolar disorder, current episode depressed, mild or moderate severity, unspecified: Secondary | ICD-10-CM

## 2022-03-20 MED ORDER — FLUCONAZOLE 150 MG PO TABS: ORAL_TABLET | ORAL | 1 refills | Status: DC

## 2022-03-20 MED ORDER — AMOXICILLIN-POT CLAVULANATE 875-125 MG PO TABS: 1.0000 | ORAL_TABLET | Freq: Two times a day (BID) | ORAL | 0 refills | Status: DC

## 2022-03-20 MED ORDER — NICOTINE 14 MG/24HR TD PT24: 14.0000 mg | MEDICATED_PATCH | Freq: Every day | TRANSDERMAL | 5 refills | Status: DC

## 2022-03-20 MED ORDER — AMLODIPINE BESYLATE 5 MG PO TABS: 5.0000 mg | ORAL_TABLET | Freq: Every day | ORAL | 3 refills | Status: DC

## 2022-03-20 NOTE — Patient Instructions (Signed)
Please take all new medication as prescribed - the antibiotic, and the diflucan if needed  Please continue all other medications as before, and refills have been done if requested - the smoking patches, and the amlodipine for blood pressure  Please have the pharmacy call with any other refills you may need.  Please continue your efforts at being more active, low cholesterol diet, and weight control.  You are otherwise up to date with prevention measures today.  Please keep your appointments with your specialists as you may have planned  No further lab work needed today  Please make an Appointment to return in 6 months, or sooner if needed

## 2022-03-20 NOTE — Progress Notes (Unsigned)
Patient ID: KHI PRESTWICH, female   DOB: [birth year], 37 y.o.   MRN: [id number]         Chief Complaint:: wellness exam and bp meds (Prescribed in hospital, needs a refill but wants to discuss it is needed), Medication Refill (Nicotine patches), and Wound Infection (Infected piercing, needs abx with an antifungal also as it causes yeast infections)  , smoker       HPI:  [name] is a 37 y.o. female here for wellness exam; declines covid booster o/w up to date.  Still smoking, not ready to quit                        Also Pt denies chest pain, increased sob or doe, wheezing, orthopnea, PND, increased LE swelling, palpitations, dizziness or syncope.   Pt denies polydipsia, polyuria, or new focal neuro s/s.    Pt denies fever, wt loss, night sweats, loss of appetite, or other constitutional symptoms  Tolerating amlodipine well.  Does have recurrence of tender swelling area left face at the piercing site, resolved with antibx last yr now recurred.  No fever, chills or drainage.     Wt Readings from Last 3 Encounters:  03/20/22 242 lb (109.8 kg)  02/17/22 230 lb (104.3 kg)  02/05/22 229 lb 4.8 oz (104 kg)   BP Readings from Last 3 Encounters:  03/20/22 116/62  02/18/22 120/73  02/10/22 108/82   Immunization History  Administered Date(s) Administered   Influenza,inj,Quad PF,6+ Mos 10/15/2016, 10/26/2017, 10/23/2020, 11/12/2021, 02/21/2022   Influenza-Unspecified 10/04/2015   PFIZER(Purple Top)SARS-COV-2 Vaccination 04/27/2019, 05/22/2019   Pneumococcal Polysaccharide-23 10/15/2016, 07/10/2020   Tdap 04/17/2016  There are no preventive care reminders to display for this patient.    Past Medical History:  Diagnosis Date   Allergy    Anxiety    Asthma    Bipolar 2 disorder ([place])    Borderline personality disorder ([place])    Depression    Eczema    Former smoker    Obesity    PTSD (post-traumatic stress disorder)    Urticaria    Past Surgical History:  Procedure Laterality  Date   Thumb surgery Left     reports that she has been smoking cigarettes. She has a 9.00 pack-year smoking history. She has been exposed to tobacco smoke. She has never used smokeless tobacco. She reports current alcohol use of about 6.0 - 12.0 standard drinks of alcohol per week. She reports current drug use. Frequency: 4.00 times per week. Drugs: Marijuana and "Crack" cocaine. family history includes Allergic rhinitis in her paternal grandmother; Alzheimer's disease in her maternal grandmother; Asthma in her paternal grandmother; Healthy in her father and mother; Skin cancer in her paternal grandmother. Allergies  Allergen Reactions   Benztropine Other (See Comments)    agitation     Other Other (See Comments)    Opiates- history of dependency    Lamictal [Lamotrigine] Rash   Current Outpatient Medications on File Prior to Visit  Medication Sig Dispense Refill   albuterol (VENTOLIN HFA) 108 (90 Base) MCG/ACT inhaler Inhale 2 puffs into the lungs every 6 (six) hours as needed for wheezing or shortness of breath. 8 g 2   calcium carbonate (OS-CAL - DOSED IN MG OF ELEMENTAL CALCIUM) 1250 (500 Ca) MG tablet Take 1 tablet (1,250 mg total) by mouth daily with breakfast. 30 tablet 0   ferrous sulfate 325 (65 FE) MG tablet Take 1 tablet (325 mg total) by  mouth daily. 30 tablet 0   gabapentin (NEURONTIN) 100 MG capsule Take 1 capsule (100 mg total) by mouth every 8 (eight) hours. 90 capsule 0   hydrOXYzine (ATARAX) 50 MG tablet Take 1 tablet (50 mg total) by mouth 2 (two) times daily as needed for anxiety. 60 tablet 1   levonorgestrel-ethinyl estradiol (NORDETTE) 0.15-30 MG-MCG tablet Take 1 tablet by mouth at bedtime. 84 tablet 1   montelukast (SINGULAIR) 10 MG tablet Take 1 tablet (10 mg total) by mouth at bedtime. 30 tablet 0   OLANZapine zydis (ZYPREXA ZYDIS) 5 MG disintegrating tablet Take 1 tablet (5 mg total) by mouth at bedtime. 30 tablet 0   OXcarbazepine (TRILEPTAL) 300 MG tablet Take 1  tablet (300 mg total) by mouth 2 (two) times daily. 60 tablet 1   paliperidone (INVEGA) 6 MG 24 hr tablet Take 1 tablet (6 mg total) by mouth daily. 30 tablet 1   pantoprazole (PROTONIX) 40 MG tablet Take 1 tablet (40 mg total) by mouth daily. 30 tablet 0   prazosin (MINIPRESS) 1 MG capsule Take 1 capsule (1 mg total) by mouth as directed. TAKE ONE IN THE AM AND 3 AT NIGHT 120 capsule 1   traZODone (DESYREL) 50 MG tablet Take 0.5 tablets (25 mg total) by mouth at bedtime as needed for sleep. 15 tablet 1   No current facility-administered medications on file prior to visit.        ROS:  All others reviewed and negative.  Objective        PE:  BP 116/62 (BP Location: Right Arm, Patient Position: Sitting, Cuff Size: Large)   Pulse 91   Temp 97.9 F (36.6 C) (Oral)   Ht 5' 2"$  (1.575 m)   Wt 242 lb (109.8 kg)   LMP 02/16/2022 (Approximate)   SpO2 96%   BMI 44.26 kg/m                 Constitutional: Pt appears in NAD               HENT: Head: NCAT.                Right Ear: External ear normal.                 Left Ear: External ear normal. Left face piercing site with < 1/2 cm area tender swelling non fluctuant non drainaing               Eyes: . Pupils are equal, round, and reactive to light. Conjunctivae and EOM are normal               Nose: without d/c or deformity               Neck: Neck supple. Gross normal ROM               Cardiovascular: Normal rate and regular rhythm.                 Pulmonary/Chest: Effort normal and breath sounds without rales or wheezing.                Abd:  Soft, NT, ND, + BS, no organomegaly               Neurological: Pt is alert. At baseline orientation, motor grossly intact               Skin: Skin is warm. No rashes, no other new lesions, LE edema -  none               Psychiatric: Pt behavior is normal without agitation   Micro: none  Cardiac tracings I have personally interpreted today:  none  Pertinent Radiological findings (summarize): none    Lab Results  Component Value Date   WBC 9.2 02/17/2022   HGB 14.4 02/17/2022   HCT 44.8 02/17/2022   PLT 272 02/17/2022   GLUCOSE 93 02/21/2022   CHOL 218 (H) 02/19/2022   TRIG 84 02/19/2022   HDL 54 02/19/2022   LDLCALC 147 (H) 02/19/2022   ALT 30 02/17/2022   AST 26 02/17/2022   NA 139 02/21/2022   K 3.9 02/21/2022   CL 105 02/21/2022   CREATININE 0.76 02/21/2022   BUN 10 02/21/2022   CO2 26 02/21/2022   TSH 2.977 02/19/2022   HGBA1C 5.2 02/19/2022   Assessment/Plan:  [name] is a 37 y.o. White or Caucasian [1] female with  has a past medical history of Allergy, Anxiety, Asthma, Bipolar 2 disorder ([place]), Borderline personality disorder ([place]), Depression, Eczema, Former smoker, Obesity, PTSD (post-traumatic stress disorder), and Urticaria.  Bipolar affective disorder ([place]) Cont f/u with psychiatry, improved  Encounter for well adult exam with abnormal findings Age and sex appropriate education and counseling updated with regular exercise and diet Referrals for preventative services - none needed Immunizations addressed - declines covid booster Smoking counseling  - pt counsled to quit, pt not ready Evidence for depression or other mood disorder - improved bipolar d/o Most recent labs reviewed. I have personally reviewed and have noted: 1) the patient's medical and social history 2) The patient's current medications and supplements 3) The patient's height, weight, and BMI have been recorded in the chart   Cellulitis Mild to mod, left face involving piercing site, for augmentin bid x 10 days,  to f/u any worsening symptoms or concerns  HTN (hypertension) BP Readings from Last 3 Encounters:  03/20/22 116/62  02/18/22 120/73  02/10/22 108/82   Stable, pt to continue medical treatment amlodipine 5 mg qd   Smoker Pt counsled to quit, pt not ready  Vaginitis Mild to mod, for diflucan 150 mg prn asd,  to f/u any worsening symptoms or concerns  Followup:  Return in about 6 months (around 09/18/2022).  Cathlean Cower, MD 03/22/2022 7:17 AM [facility]

## 2022-03-22 ENCOUNTER — Encounter: Payer: Self-pay | Admitting: Internal Medicine

## 2022-03-22 NOTE — Assessment & Plan Note (Signed)
BP Readings from Last 3 Encounters:  03/20/22 116/62  02/18/22 120/73  02/10/22 108/82   Stable, pt to continue medical treatment amlodipine 5 mg qd

## 2022-03-22 NOTE — Assessment & Plan Note (Signed)
Mild to mod, for diflucan 150 mg prn asd,  to f/u any worsening symptoms or concerns

## 2022-03-22 NOTE — Assessment & Plan Note (Signed)
Mild to mod, left face involving piercing site, for augmentin bid x 10 days,  to f/u any worsening symptoms or concerns

## 2022-03-22 NOTE — Assessment & Plan Note (Signed)
Age and sex appropriate education and counseling updated with regular exercise and diet Referrals for preventative services - none needed Immunizations addressed - declines covid booster Smoking counseling  - pt counsled to quit, pt not ready Evidence for depression or other mood disorder - improved bipolar d/o Most recent labs reviewed. I have personally reviewed and have noted: 1) the patient's medical and social history 2) The patient's current medications and supplements 3) The patient's height, weight, and BMI have been recorded in the chart

## 2022-03-22 NOTE — Assessment & Plan Note (Signed)
Pt counsled to quit, pt not ready °

## 2022-03-22 NOTE — Assessment & Plan Note (Signed)
Cont f/u with psychiatry, improved

## 2022-03-26 ENCOUNTER — Other Ambulatory Visit (HOSPITAL_COMMUNITY): Payer: Self-pay

## 2022-03-26 NOTE — Telephone Encounter (Signed)
Patient never called back to order same after advising she would reach out to get Rx

## 2022-03-31 ENCOUNTER — Other Ambulatory Visit (HOSPITAL_COMMUNITY): Payer: Self-pay | Admitting: Psychiatry

## 2022-04-09 ENCOUNTER — Telehealth: Payer: Medicaid Other | Admitting: Physician Assistant

## 2022-04-09 ENCOUNTER — Telehealth: Payer: Medicaid Other

## 2022-04-09 ENCOUNTER — Other Ambulatory Visit (HOSPITAL_COMMUNITY): Payer: Self-pay

## 2022-04-09 ENCOUNTER — Encounter: Payer: Self-pay | Admitting: Physician Assistant

## 2022-04-09 DIAGNOSIS — J4541 Moderate persistent asthma with (acute) exacerbation: Secondary | ICD-10-CM | POA: Diagnosis not present

## 2022-04-09 DIAGNOSIS — T148XXA Other injury of unspecified body region, initial encounter: Secondary | ICD-10-CM | POA: Diagnosis not present

## 2022-04-09 MED ORDER — FLUCONAZOLE 150 MG PO TABS: ORAL_TABLET | ORAL | 0 refills | Status: DC

## 2022-04-09 MED ORDER — BENZONATATE 100 MG PO CAPS: 100.0000 mg | ORAL_CAPSULE | Freq: Three times a day (TID) | ORAL | 0 refills | Status: DC | PRN

## 2022-04-09 MED ORDER — AMOXICILLIN-POT CLAVULANATE 875-125 MG PO TABS: 1.0000 | ORAL_TABLET | Freq: Two times a day (BID) | ORAL | 0 refills | Status: DC

## 2022-04-09 MED ORDER — PREDNISONE 20 MG PO TABS: 40.0000 mg | ORAL_TABLET | Freq: Every day | ORAL | 0 refills | Status: DC

## 2022-04-09 MED ORDER — ALBUTEROL SULFATE HFA 108 (90 BASE) MCG/ACT IN AERS: 2.0000 | INHALATION_SPRAY | Freq: Four times a day (QID) | RESPIRATORY_TRACT | 0 refills | Status: DC | PRN

## 2022-04-09 NOTE — Addendum Note (Signed)
Addended by: Brunetta Jeans on: 04/09/2022 06:40 PM   Modules accepted: Orders

## 2022-04-09 NOTE — Patient Instructions (Signed)
[name], thank you for joining Leeanne Rio, PA-C for today's virtual visit.  While this provider is not your primary care provider (PCP), if your PCP is located in our provider database this encounter information will be shared with them immediately following your visit.   [facility] account gives you access to today's visit and all your visits, tests, and labs performed at [facility] " click here if you don't have a [facility] account or go to [email or web address]  Consent: (Patient) [name] provided verbal consent for this virtual visit at the beginning of the encounter.  Current Medications:  Current Outpatient Medications:    albuterol (VENTOLIN HFA) 108 (90 Base) MCG/ACT inhaler, Inhale 2 puffs into the lungs every 6 (six) hours as needed for wheezing or shortness of breath., Disp: 8 g, Rfl: 2   amLODipine (NORVASC) 5 MG tablet, Take 1 tablet (5 mg total) by mouth daily., Disp: 90 tablet, Rfl: 3   amoxicillin-clavulanate (AUGMENTIN) 875-125 MG tablet, Take 1 tablet by mouth 2 (two) times daily., Disp: 20 tablet, Rfl: 0   calcium carbonate (OS-CAL - DOSED IN MG OF ELEMENTAL CALCIUM) 1250 (500 Ca) MG tablet, Take 1 tablet (1,250 mg total) by mouth daily with breakfast., Disp: 30 tablet, Rfl: 0   ferrous sulfate 325 (65 FE) MG tablet, Take 1 tablet (325 mg total) by mouth daily., Disp: 30 tablet, Rfl: 0   fluconazole (DIFLUCAN) 150 MG tablet, 1 tab by mouth every 3 days as needed, Disp: 2 tablet, Rfl: 1   gabapentin (NEURONTIN) 100 MG capsule, Take 1 capsule (100 mg total) by mouth every 8 (eight) hours., Disp: 90 capsule, Rfl: 0   hydrOXYzine (ATARAX) 50 MG tablet, Take 1 tablet (50 mg total) by mouth 2 (two) times daily as needed for anxiety., Disp: 60 tablet, Rfl: 1   levonorgestrel-ethinyl estradiol (NORDETTE) 0.15-30 MG-MCG tablet, Take 1 tablet by mouth at bedtime., Disp: 84 tablet, Rfl: 1   montelukast (SINGULAIR) 10 MG  tablet, Take 1 tablet (10 mg total) by mouth at bedtime., Disp: 30 tablet, Rfl: 0   nicotine (NICODERM CQ - DOSED IN MG/24 HOURS) 14 mg/24hr patch, Place 1 patch (14 mg total) onto the skin daily., Disp: 28 patch, Rfl: 5   OLANZapine zydis (ZYPREXA) 5 MG disintegrating tablet, Take 1 tablet (5 mg total) by mouth at bedtime., Disp: 30 tablet, Rfl: 0   OXcarbazepine (TRILEPTAL) 300 MG tablet, Take 1 tablet (300 mg total) by mouth 2 (two) times daily., Disp: 60 tablet, Rfl: 1   paliperidone (INVEGA) 6 MG 24 hr tablet, Take 1 tablet (6 mg total) by mouth daily., Disp: 30 tablet, Rfl: 1   pantoprazole (PROTONIX) 40 MG tablet, Take 1 tablet (40 mg total) by mouth daily., Disp: 30 tablet, Rfl: 0   prazosin (MINIPRESS) 1 MG capsule, Take 1 capsule (1 mg total) by mouth as directed. TAKE ONE IN THE AM AND 3 AT NIGHT, Disp: 120 capsule, Rfl: 1   traZODone (DESYREL) 50 MG tablet, Take 0.5 tablets (25 mg total) by mouth at bedtime as needed for sleep., Disp: 15 tablet, Rfl: 1   Medications ordered in this encounter:  No orders of the defined types were placed in this encounter.    *If you need refills on other medications prior to your next appointment, please contact your pharmacy*  Follow-Up: Call back or seek an in-person evaluation if the symptoms worsen or if the condition fails to improve as anticipated.  [facility] [phone]  Other Instructions Please keep hydrated and rest. Continue your regular asthma medications. (Albuterol has been refilled). Use the Tessalon and prednisone as directed. Continue cleaning the piercing -- if this worsens, you may need to remove it. Take the antibiotic as directed.  If things are not resolving or any new or worsening symptoms, please seek an in-person evaluation.    If you have been instructed to have an in-person evaluation today at a local Urgent Care facility, please use the link below. It will take you to a list of all of our  available [facility], including address, phone number and hours of operation. Please do not delay care.  [facility]  If you or a family member do not have a primary care provider, use the link below to schedule a visit and establish care. When you choose a [facility] primary care physician or advanced practice provider, you gain a long-term partner in health. Find a Primary Care Provider  Learn more about 's in-office and virtual care options: [facility]

## 2022-04-09 NOTE — Progress Notes (Signed)
Virtual Visit Consent   [name], you are scheduled for a virtual visit with a [facility] provider today. Just as with appointments in the office, your consent must be obtained to participate. Your consent will be active for this visit and any virtual visit you may have with one of our providers in the next 365 days. If you have a MyChart account, a copy of this consent can be sent to you electronically.  As this is a virtual visit, video technology does not allow for your provider to perform a traditional examination. This may limit your provider's ability to fully assess your condition. If your provider identifies any concerns that need to be evaluated in person or the need to arrange testing (such as labs, EKG, etc.), we will make arrangements to do so. Although advances in technology are sophisticated, we cannot ensure that it will always work on either your end or our end. If the connection with a video visit is poor, the visit may have to be switched to a telephone visit. With either a video or telephone visit, we are not always able to ensure that we have a secure connection.  By engaging in this virtual visit, you consent to the provision of healthcare and authorize for your insurance to be billed (if applicable) for the services provided during this visit. Depending on your insurance coverage, you may receive a charge related to this service.  I need to obtain your verbal consent now. Are you willing to proceed with your visit today? [name] has provided verbal consent on 04/09/2022 for a virtual visit (video or telephone). Leeanne Rio, [place]  Date: 04/09/2022 12:36 PM  Virtual Visit via Video Note   I, Leeanne Rio, connected with  [name]  ([id number], 02-18-1985) on 04/09/22 at 12:30 PM EST by a video-enabled telemedicine application and verified that I am speaking with the correct person using two identifiers.  Location: Patient: Virtual Visit Location  Patient: Home Provider: Virtual Visit Location Provider: Home Office   I discussed the limitations of evaluation and management by telemedicine and the availability of in person appointments. The patient expressed understanding and agreed to proceed.    History of Present Illness: [name] is a 37 y.o. who identifies as a female who was assigned female at birth, and is being seen today for URI symptoms around a week ago. Patient endorses noting chest congestion and cough that is dry but frequent and severe. Over past day or so noting substantial increase in her asthmatic symptoms. Substantial chest tightness/wheezing with increased use of albuterol inhaler.   Also noting infected buccal piercing (left-sided) over the past day with redness, swelling. She has been trying to keep area clean and dry. Concerned about infection. This is not a new piercing for her.   HPI: HPI  Problems:  Patient Active Problem List   Diagnosis Date Noted   Smoker 03/20/2022   HTN (hypertension) 03/20/2022   Vaginitis 03/20/2022   Suicide ideation 02/18/2022   Substance abuse ([place]) 02/18/2022   Bipolar 1 disorder, manic, full remission ([place]) 02/18/2022   Abscess of external cheek, left 01/01/2022   Rash 12/25/2021   Asthma exacerbation 11/26/2021   STD exposure 11/26/2021   Insect bite 11/26/2021   Bipolar affective disorder ([place]) 11/13/2021   Encounter for well adult exam with abnormal findings 11/12/2021   Cellulitis 11/12/2021   Vaginal discharge 08/26/2021   BRBPR (bright red blood per rectum) 02/27/2021   Anal  fissure 02/27/2021   Facial abscess 0000000   Folliculitis [id number]   Bipolar 2 disorder, major depressive episode ([place]) 12/20/2020   Vitamin D deficiency 07/15/2020   B12 deficiency 07/15/2020   MDD (major depressive disorder), recurrent severe, without psychosis ([place]) 04/26/2020   PTSD (post-traumatic stress disorder) 01/03/2020   Allergic asthma 01/13/2018   Allergic rhinitis  03/12/2017   Class 2 obesity due to excess calories without serious comorbidity with body mass index (BMI) of 37.0 to 37.9 in adult 04/17/2016   Acute asthmatic bronchitis 03/13/2016   Borderline personality disorder in adult [facility]) 03/13/2016   Mood disorder ([place]) 03/04/2013    Allergies:  Allergies  Allergen Reactions   Benztropine Other (See Comments)    agitation     Other Other (See Comments)    Opiates- history of dependency    Lamictal [Lamotrigine] Rash   Medications:  Current Outpatient Medications:    amoxicillin-clavulanate (AUGMENTIN) 875-125 MG tablet, Take 1 tablet by mouth 2 (two) times daily., Disp: 20 tablet, Rfl: 0   benzonatate (TESSALON) 100 MG capsule, Take 1 capsule (100 mg total) by mouth 3 (three) times daily as needed for cough., Disp: 30 capsule, Rfl: 0   predniSONE (DELTASONE) 20 MG tablet, Take 2 tablets (40 mg total) by mouth daily with breakfast., Disp: 10 tablet, Rfl: 0   albuterol (VENTOLIN HFA) 108 (90 Base) MCG/ACT inhaler, Inhale 2 puffs into the lungs every 6 (six) hours as needed for wheezing or shortness of breath., Disp: 8 g, Rfl: 0   amLODipine (NORVASC) 5 MG tablet, Take 1 tablet (5 mg total) by mouth daily., Disp: 90 tablet, Rfl: 3   calcium carbonate (OS-CAL - DOSED IN MG OF ELEMENTAL CALCIUM) 1250 (500 Ca) MG tablet, Take 1 tablet (1,250 mg total) by mouth daily with breakfast., Disp: 30 tablet, Rfl: 0   ferrous sulfate 325 (65 FE) MG tablet, Take 1 tablet (325 mg total) by mouth daily., Disp: 30 tablet, Rfl: 0   fluconazole (DIFLUCAN) 150 MG tablet, 1 tab by mouth every 3 days as needed, Disp: 2 tablet, Rfl: 1   gabapentin (NEURONTIN) 100 MG capsule, Take 1 capsule (100 mg total) by mouth every 8 (eight) hours., Disp: 90 capsule, Rfl: 0   hydrOXYzine (ATARAX) 50 MG tablet, Take 1 tablet (50 mg total) by mouth 2 (two) times daily as needed for anxiety., Disp: 60 tablet, Rfl: 1   levonorgestrel-ethinyl estradiol (NORDETTE) 0.15-30 MG-MCG tablet,  Take 1 tablet by mouth at bedtime., Disp: 84 tablet, Rfl: 1   montelukast (SINGULAIR) 10 MG tablet, Take 1 tablet (10 mg total) by mouth at bedtime., Disp: 30 tablet, Rfl: 0   nicotine (NICODERM CQ - DOSED IN MG/24 HOURS) 14 mg/24hr patch, Place 1 patch (14 mg total) onto the skin daily., Disp: 28 patch, Rfl: 5   OLANZapine zydis (ZYPREXA) 5 MG disintegrating tablet, Take 1 tablet (5 mg total) by mouth at bedtime., Disp: 30 tablet, Rfl: 0   OXcarbazepine (TRILEPTAL) 300 MG tablet, Take 1 tablet (300 mg total) by mouth 2 (two) times daily., Disp: 60 tablet, Rfl: 1   paliperidone (INVEGA) 6 MG 24 hr tablet, Take 1 tablet (6 mg total) by mouth daily., Disp: 30 tablet, Rfl: 1   pantoprazole (PROTONIX) 40 MG tablet, Take 1 tablet (40 mg total) by mouth daily., Disp: 30 tablet, Rfl: 0   prazosin (MINIPRESS) 1 MG capsule, Take 1 capsule (1 mg total) by mouth as directed. TAKE ONE IN THE AM AND 3 AT NIGHT, Disp:  120 capsule, Rfl: 1   traZODone (DESYREL) 50 MG tablet, Take 0.5 tablets (25 mg total) by mouth at bedtime as needed for sleep., Disp: 15 tablet, Rfl: 1  Observations/Objective: Patient is well-developed, well-nourished in no acute distress.  Resting comfortably at home.  Head is normocephalic, atraumatic.  No labored breathing. Speech is clear and coherent with logical content.  Patient is alert and oriented at baseline.  R "dimple" piercing noted with surrounding erythema and mild swelling about 2 cm in diameter per visual inspection.  Assessment and Plan: 1. Moderate persistent asthmatic bronchitis with acute exacerbation - amoxicillin-clavulanate (AUGMENTIN) 875-125 MG tablet; Take 1 tablet by mouth 2 (two) times daily.  Dispense: 20 tablet; Refill: 0 - benzonatate (TESSALON) 100 MG capsule; Take 1 capsule (100 mg total) by mouth 3 (three) times daily as needed for cough.  Dispense: 30 capsule; Refill: 0 - predniSONE (DELTASONE) 20 MG tablet; Take 2 tablets (40 mg total) by mouth daily  with breakfast.  Dispense: 10 tablet; Refill: 0 - albuterol (VENTOLIN HFA) 108 (90 Base) MCG/ACT inhaler; Inhale 2 puffs into the lungs every 6 (six) hours as needed for wheezing or shortness of breath.  Dispense: 8 g; Refill: 0  With infected piercing as well. Start Augmentin. Rx Tessalon and prednisone (notes she does well with steroids for her asthma, despite her history of BPD). Albuterol refilled.  Increase fluids.  Rest.  Saline nasal spray.  Probiotic.  Mucinex as directed.  Humidifier in bedroom.  Call or return to clinic if symptoms are not improving.   2. Piercing  Skin care reviewed. Start Augmentin as directed. Follow-up if symptoms are not improving or resolving.   Follow Up Instructions: I discussed the assessment and treatment plan with the patient. The patient was provided an opportunity to ask questions and all were answered. The patient agreed with the plan and demonstrated an understanding of the instructions.  A copy of instructions were sent to the patient via MyChart unless otherwise noted below.   The patient was advised to call back or seek an in-person evaluation if the symptoms worsen or if the condition fails to improve as anticipated.  Time:  I spent 15 minutes with the patient via telehealth technology discussing the above problems/concerns.    Leeanne Rio, PA-C

## 2022-04-10 ENCOUNTER — Ambulatory Visit: Payer: Medicaid Other | Admitting: Internal Medicine

## 2022-04-20 ENCOUNTER — Encounter (HOSPITAL_COMMUNITY): Payer: Self-pay

## 2022-04-20 ENCOUNTER — Telehealth (HOSPITAL_COMMUNITY): Payer: Medicaid Other | Admitting: Psychiatry

## 2022-04-21 ENCOUNTER — Encounter (HOSPITAL_COMMUNITY): Payer: Self-pay | Admitting: Psychiatry

## 2022-04-21 ENCOUNTER — Telehealth (HOSPITAL_COMMUNITY): Payer: Self-pay | Admitting: Psychiatry

## 2022-04-21 MED ORDER — OLANZAPINE 5 MG PO TBDP: 5.0000 mg | ORAL_TABLET | Freq: Every day | ORAL | 0 refills | Status: AC

## 2022-04-21 MED ORDER — GABAPENTIN 100 MG PO CAPS: 100.0000 mg | ORAL_CAPSULE | Freq: Three times a day (TID) | ORAL | 0 refills | Status: DC

## 2022-04-21 NOTE — Telephone Encounter (Signed)
Patient returned call for update on status after 3rd no-show for provider appointment yesterday. Informed patient she has been dismissed from provider's care due to excessive no-shows in accordance with clinic policy. Informed patient of letter to be sent with information regarding assistance in finding a new provider and for the [facility] option for urgent issues. Provided phone number for [facility] as she is a Medicaid recipient. Patient requested medication refills on all her medications prescribed by provider.   gabapentin (NEURONTIN) 100 MG capsule  Last ordered: 03/11/2022 - 90 Capsules  hydrOXYzine (ATARAX) 50 MG tablet  Last ordered: 03/11/2022 - 60 tablets with 1 refill  OLANZapine zydis (ZYPREXA) 5 MG disintegrating tablet  Last ordered: 03/31/2022 - 30 tablets  OXcarbazepine (TRILEPTAL) 300 MG tablet  Last ordered: 03/11/2022 - 60 tablets with 1 refill  paliperidone (INVEGA) 6 MG 24 hr tablet  Last ordered: 03/11/2022 - 30 tablets with 1 refill  prazosin (MINIPRESS) 1 MG capsule  Last ordered: 03/13/2022 - 120 capsules with 1 refill  traZODone (DESYREL) 50 MG tablet  Last ordered: 03/11/2022 - 15 tablets with 1 refill   Ord, [place] (Ph: [phone])   Last visit: 03/11/2022  Next visit: None - dismissed from provider 04/20/2022   Patient called back and stated she has found  provider to accept her as a patient and requested medical records be sent to [place] in [place]. Explained the Release of Information form/process and will include this in the dismissal letter mailed to patient. Provided the contact number for Medical Records as patient also requested a copy of her records for herself.

## 2022-04-23 ENCOUNTER — Telehealth (HOSPITAL_COMMUNITY): Payer: Self-pay | Admitting: Psychiatry

## 2022-04-23 NOTE — Telephone Encounter (Signed)
Patient left voicemail 04/22/2022@1642  stating her prescription for prazosin (MINIPRESS) 1 MG capsule was filled incorrectly for only 30 capsules and it should have been for 120 capsules. Returned call and patient voiced distress over this issue and stated she was having trouble sleeping. Stated she was taking medication as provider instructed - 1 in the morning and 3 at night but would be out in less than 2 weeks. [place] 04/23/2022 and pharmacy staff indicated patient had called in an older prescription for 1 per day but still had the new prescriptions from provider on file. Called patient to inform and left voicemail with this updated information.      Patient called back at 1545 stating she had contacted the pharmacy and was told they could not locate the order for prazosin (MINIPRESS) 1 MG capsule and requests this be sent again with one refill as sent previously on 03/13/2022.   [place], [place] (Ph: [phone])   Last visit: 03/11/2022  Next visit: None scheduled. Patient dismissed due to no-shows 04/20/2022.

## 2022-04-23 NOTE — Telephone Encounter (Signed)
Patient left voicemail 04/22/2022@1642  stating her prescription for prazosin (MINIPRESS) 1 MG capsule was filled incorrectly for only 30 capsules and it should have been for 120 capsules. Returned call and patient voiced distress over this issue and stated she was having trouble sleeping. Stated she was taking medication as provider instructed - 1 in the morning and 3 at night but would be out in less than 2 weeks. [place] 04/23/2022 and pharmacy staff indicated patient had called in an older prescription for 1 per day but still had the new prescriptions from provider on file. Called patient to inform and left voicemail with this updated information.

## 2022-04-24 ENCOUNTER — Other Ambulatory Visit: Payer: Self-pay | Admitting: *Deleted

## 2022-04-24 MED ORDER — MONTELUKAST SODIUM 10 MG PO TABS: 10.0000 mg | ORAL_TABLET | Freq: Every day | ORAL | 5 refills | Status: DC

## 2022-04-24 MED ORDER — PRAZOSIN HCL 1 MG PO CAPS: 1.0000 mg | ORAL_CAPSULE | ORAL | 0 refills | Status: DC

## 2022-05-09 ENCOUNTER — Other Ambulatory Visit (HOSPITAL_COMMUNITY): Payer: Self-pay | Admitting: Psychiatry

## 2022-05-11 ENCOUNTER — Other Ambulatory Visit: Payer: Self-pay | Admitting: Physician Assistant

## 2022-05-11 ENCOUNTER — Telehealth: Payer: Medicaid Other | Admitting: Family Medicine

## 2022-05-11 ENCOUNTER — Telehealth: Payer: Self-pay | Admitting: Allergy & Immunology

## 2022-05-11 ENCOUNTER — Telehealth: Payer: Medicaid Other

## 2022-05-11 ENCOUNTER — Other Ambulatory Visit (HOSPITAL_COMMUNITY): Payer: Self-pay | Admitting: Psychiatry

## 2022-05-11 DIAGNOSIS — Z789 Other specified health status: Secondary | ICD-10-CM | POA: Diagnosis not present

## 2022-05-11 DIAGNOSIS — L089 Local infection of the skin and subcutaneous tissue, unspecified: Secondary | ICD-10-CM

## 2022-05-11 DIAGNOSIS — B9689 Other specified bacterial agents as the cause of diseases classified elsewhere: Secondary | ICD-10-CM | POA: Diagnosis not present

## 2022-05-11 MED ORDER — SULFAMETHOXAZOLE-TRIMETHOPRIM 800-160 MG PO TABS: 1.0000 | ORAL_TABLET | Freq: Two times a day (BID) | ORAL | 0 refills | Status: DC

## 2022-05-11 MED ORDER — MUPIROCIN 2 % EX OINT: 1.0000 | TOPICAL_OINTMENT | Freq: Two times a day (BID) | CUTANEOUS | 0 refills | Status: DC

## 2022-05-11 NOTE — Telephone Encounter (Signed)
Pt informed of message.

## 2022-05-11 NOTE — Patient Instructions (Signed)
[name], thank you for joining Freddy Finner, NP for today's virtual visit.  While this provider is not your primary care provider (PCP), if your PCP is located in our provider database this encounter information will be shared with them immediately following your visit.   A [facility] MyChart account gives you access to today's visit and all your visits, tests, and labs performed at [facility] " click here if you don't have a  MyChart account or go to [email or web address]  Consent: (Patient) [name] provided verbal consent for this virtual visit at the beginning of the encounter.  Current Medications:  Current Outpatient Medications:    mupirocin ointment (BACTROBAN) 2 %, Apply 1 Application topically 2 (two) times daily., Disp: 22 g, Rfl: 0   sulfamethoxazole-trimethoprim (BACTRIM DS) 800-160 MG tablet, Take 1 tablet by mouth 2 (two) times daily for 7 days., Disp: 14 tablet, Rfl: 0   albuterol (VENTOLIN HFA) 108 (90 Base) MCG/ACT inhaler, Inhale 2 puffs into the lungs every 6 (six) hours as needed for wheezing or shortness of breath., Disp: 8 g, Rfl: 0   amLODipine (NORVASC) 5 MG tablet, Take 1 tablet (5 mg total) by mouth daily., Disp: 90 tablet, Rfl: 3   amoxicillin-clavulanate (AUGMENTIN) 875-125 MG tablet, Take 1 tablet by mouth 2 (two) times daily., Disp: 20 tablet, Rfl: 0   benzonatate (TESSALON) 100 MG capsule, Take 1 capsule (100 mg total) by mouth 3 (three) times daily as needed for cough., Disp: 30 capsule, Rfl: 0   calcium carbonate (OS-CAL - DOSED IN MG OF ELEMENTAL CALCIUM) 1250 (500 Ca) MG tablet, Take 1 tablet (1,250 mg total) by mouth daily with breakfast., Disp: 30 tablet, Rfl: 0   ferrous sulfate 325 (65 FE) MG tablet, Take 1 tablet (325 mg total) by mouth daily., Disp: 30 tablet, Rfl: 0   fluconazole (DIFLUCAN) 150 MG tablet, Take 1 tablet PO once. Repeat in 3 days if needed., Disp: 2 tablet, Rfl: 0   gabapentin (NEURONTIN) 100  MG capsule, Take 1 capsule (100 mg total) by mouth every 8 (eight) hours., Disp: 90 capsule, Rfl: 0   hydrOXYzine (ATARAX) 50 MG tablet, Take 1 tablet (50 mg total) by mouth 2 (two) times daily as needed for anxiety., Disp: 60 tablet, Rfl: 1   levonorgestrel-ethinyl estradiol (NORDETTE) 0.15-30 MG-MCG tablet, Take 1 tablet by mouth at bedtime., Disp: 84 tablet, Rfl: 1   montelukast (SINGULAIR) 10 MG tablet, Take 1 tablet (10 mg total) by mouth at bedtime., Disp: 30 tablet, Rfl: 5   nicotine (NICODERM CQ - DOSED IN MG/24 HOURS) 14 mg/24hr patch, Place 1 patch (14 mg total) onto the skin daily., Disp: 28 patch, Rfl: 5   OLANZapine zydis (ZYPREXA) 5 MG disintegrating tablet, Take 1 tablet (5 mg total) by mouth at bedtime., Disp: 30 tablet, Rfl: 0   OXcarbazepine (TRILEPTAL) 300 MG tablet, Take 1 tablet (300 mg total) by mouth 2 (two) times daily., Disp: 60 tablet, Rfl: 1   paliperidone (INVEGA) 6 MG 24 hr tablet, Take 1 tablet (6 mg total) by mouth daily., Disp: 30 tablet, Rfl: 1   pantoprazole (PROTONIX) 40 MG tablet, Take 1 tablet (40 mg total) by mouth daily., Disp: 30 tablet, Rfl: 0   prazosin (MINIPRESS) 1 MG capsule, Take 1 capsule (1 mg total) by mouth as directed. TAKE ONE IN THE AM AND 3 AT NIGHT, Disp: 120 capsule, Rfl: 0   predniSONE (DELTASONE) 20 MG tablet, Take 2 tablets (40 mg total)  by mouth daily with breakfast., Disp: 10 tablet, Rfl: 0   traZODone (DESYREL) 50 MG tablet, Take 0.5 tablets (25 mg total) by mouth at bedtime as needed for sleep., Disp: 15 tablet, Rfl: 1   Medications ordered in this encounter:  Meds ordered this encounter  Medications   sulfamethoxazole-trimethoprim (BACTRIM DS) 800-160 MG tablet    Sig: Take 1 tablet by mouth 2 (two) times daily for 7 days.    Dispense:  14 tablet    Refill:  0    Order Specific Question:   Supervising Provider    Answer:   Merrilee Jansky [[id number]]   mupirocin ointment (BACTROBAN) 2 %    Sig: Apply 1 Application topically 2  (two) times daily.    Dispense:  22 g    Refill:  0    Order Specific Question:   Supervising Provider    Answer:   Merrilee Jansky [id number]     *If you need refills on other medications prior to your next appointment, please contact your pharmacy*  Follow-Up: Call back or seek an in-person evaluation if the symptoms worsen or if the condition fails to improve as anticipated.  [facility] [phone]  Other Instructions Please follow up in person for asthma As well as skin infection if not better   If you have been instructed to have an in-person evaluation today at a local Urgent Care facility, please use the link below. It will take you to a list of all of our available [facility], including address, phone number and hours of operation. Please do not delay care.  [facility]  If you or a family member do not have a primary care provider, use the link below to schedule a visit and establish care. When you choose a [facility] primary care physician or advanced practice provider, you gain a long-term partner in health. Find a Primary Care Provider  Learn more about [facility]'s in-office and virtual care options: [facility] - Get Care Now

## 2022-05-11 NOTE — Telephone Encounter (Signed)
Have her increase her symbicort to 4 puffs Three times a day,  If she still needs her albuterol every 4 hours on top of the increased symbicort and montelukast she may need to go to the ED.

## 2022-05-11 NOTE — Telephone Encounter (Signed)
Pt called back and she has been having shortness of breath and chest tightness and wheezing for 4-5 days and she is using albuterol hfa every 4 hours and Symbicort 2 puffs twice a day and Singulair. She is not able to come in for a visit this week because she doesn't drive and her roommate is working 12 hour shifts all week. She would have to do telephone visits. Please advise.

## 2022-05-11 NOTE — Telephone Encounter (Signed)
Lm for pt to call us back need to know iif she is doing albuterol, montleukast, symbicort? Is she hving chest tightness, wheezing, short of breath, or hard to catch her breath?

## 2022-05-11 NOTE — Progress Notes (Signed)
Virtual Visit Consent   [name], you are scheduled for a virtual visit with a  provider today. Just as with appointments in the office, your consent must be obtained to participate. Your consent will be active for this visit and any virtual visit you may have with one of our providers in the next 365 days. If you have a MyChart account, a copy of this consent can be sent to you electronically.  As this is a virtual visit, video technology does not allow for your provider to perform a traditional examination. This may limit your provider's ability to fully assess your condition. If your provider identifies any concerns that need to be evaluated in person or the need to arrange testing (such as labs, EKG, etc.), we will make arrangements to do so. Although advances in technology are sophisticated, we cannot ensure that it will always work on either your end or our end. If the connection with a video visit is poor, the visit may have to be switched to a telephone visit. With either a video or telephone visit, we are not always able to ensure that we have a secure connection.  By engaging in this virtual visit, you consent to the provision of healthcare and authorize for your insurance to be billed (if applicable) for the services provided during this visit. Depending on your insurance coverage, you may receive a charge related to this service.  I need to obtain your verbal consent now. Are you willing to proceed with your visit today? [name] has provided verbal consent on 05/11/2022 for a virtual visit (video or telephone). Freddy Finner, NP  Date: 05/11/2022 3:22 PM  Virtual Visit via Video Note   I, Freddy Finner, connected with  [name]  ([id number], [birth year]) on 05/11/22 at  3:15 PM EDT by a video-enabled telemedicine application and verified that I am speaking with the correct person using two identifiers.  Location: Patient: Virtual Visit Location Patient:  Home Provider: Virtual Visit Location Provider: Home Office   I discussed the limitations of evaluation and management by telemedicine and the availability of in person appointments. The patient expressed understanding and agreed to proceed.    History of Present Illness: [name] is a 37 y.o. who identifies as a female who was assigned female at birth, and is being seen today for left side cheek- piercing. Original piercing time was last July 2023- has had it infected prior to this time as well. Treated with antibiotics at that time given Augmentin and it cleared up.  Allergic Asthma- thinks it might be getting flared up. Taking PRN rescue 2-4 times daily right now. Singular and Zyrtec, and Flonase. Reports this started getting worse 3-5 days ago.  Problems:  Patient Active Problem List   Diagnosis Date Noted   Smoker 03/20/2022   HTN (hypertension) 03/20/2022   Vaginitis 03/20/2022   Suicide ideation 02/18/2022   Substance abuse 02/18/2022   Bipolar 1 disorder, manic, full remission 02/18/2022   Abscess of external cheek, left 01/01/2022   Rash 12/25/2021   Asthma exacerbation 11/26/2021   STD exposure 11/26/2021   Insect bite 11/26/2021   Bipolar affective disorder 11/13/2021   Encounter for well adult exam with abnormal findings 11/12/2021   Cellulitis 11/12/2021   Vaginal discharge 08/26/2021   BRBPR (bright red blood per rectum) 02/27/2021   Anal fissure 02/27/2021   Facial abscess 02/12/2021   Folliculitis 12/21/2020   Bipolar 2 disorder, major depressive episode  12/20/2020   Vitamin D deficiency 07/15/2020   B12 deficiency 07/15/2020   MDD (major depressive disorder), recurrent severe, without psychosis 04/26/2020   PTSD (post-traumatic stress disorder) 01/03/2020   Allergic asthma 01/13/2018   Allergic rhinitis 03/12/2017   Class 2 obesity due to excess calories without serious comorbidity with body mass index (BMI) of 37.0 to 37.9 in adult 04/17/2016   Acute  asthmatic bronchitis 03/13/2016   Borderline personality disorder in adult 03/13/2016   Mood disorder 03/04/2013    Allergies:  Allergies  Allergen Reactions   Benztropine Other (See Comments)    agitation     Other Other (See Comments)    Opiates- history of dependency    Lamictal [Lamotrigine] Rash   Medications:  Current Outpatient Medications:    albuterol (VENTOLIN HFA) 108 (90 Base) MCG/ACT inhaler, Inhale 2 puffs into the lungs every 6 (six) hours as needed for wheezing or shortness of breath., Disp: 8 g, Rfl: 0   amLODipine (NORVASC) 5 MG tablet, Take 1 tablet (5 mg total) by mouth daily., Disp: 90 tablet, Rfl: 3   amoxicillin-clavulanate (AUGMENTIN) 875-125 MG tablet, Take 1 tablet by mouth 2 (two) times daily., Disp: 20 tablet, Rfl: 0   benzonatate (TESSALON) 100 MG capsule, Take 1 capsule (100 mg total) by mouth 3 (three) times daily as needed for cough., Disp: 30 capsule, Rfl: 0   calcium carbonate (OS-CAL - DOSED IN MG OF ELEMENTAL CALCIUM) 1250 (500 Ca) MG tablet, Take 1 tablet (1,250 mg total) by mouth daily with breakfast., Disp: 30 tablet, Rfl: 0   ferrous sulfate 325 (65 FE) MG tablet, Take 1 tablet (325 mg total) by mouth daily., Disp: 30 tablet, Rfl: 0   fluconazole (DIFLUCAN) 150 MG tablet, Take 1 tablet PO once. Repeat in 3 days if needed., Disp: 2 tablet, Rfl: 0   gabapentin (NEURONTIN) 100 MG capsule, Take 1 capsule (100 mg total) by mouth every 8 (eight) hours., Disp: 90 capsule, Rfl: 0   hydrOXYzine (ATARAX) 50 MG tablet, Take 1 tablet (50 mg total) by mouth 2 (two) times daily as needed for anxiety., Disp: 60 tablet, Rfl: 1   levonorgestrel-ethinyl estradiol (NORDETTE) 0.15-30 MG-MCG tablet, Take 1 tablet by mouth at bedtime., Disp: 84 tablet, Rfl: 1   montelukast (SINGULAIR) 10 MG tablet, Take 1 tablet (10 mg total) by mouth at bedtime., Disp: 30 tablet, Rfl: 5   nicotine (NICODERM CQ - DOSED IN MG/24 HOURS) 14 mg/24hr patch, Place 1 patch (14 mg total) onto  the skin daily., Disp: 28 patch, Rfl: 5   OLANZapine zydis (ZYPREXA) 5 MG disintegrating tablet, Take 1 tablet (5 mg total) by mouth at bedtime., Disp: 30 tablet, Rfl: 0   OXcarbazepine (TRILEPTAL) 300 MG tablet, Take 1 tablet (300 mg total) by mouth 2 (two) times daily., Disp: 60 tablet, Rfl: 1   paliperidone (INVEGA) 6 MG 24 hr tablet, Take 1 tablet (6 mg total) by mouth daily., Disp: 30 tablet, Rfl: 1   pantoprazole (PROTONIX) 40 MG tablet, Take 1 tablet (40 mg total) by mouth daily., Disp: 30 tablet, Rfl: 0   prazosin (MINIPRESS) 1 MG capsule, Take 1 capsule (1 mg total) by mouth as directed. TAKE ONE IN THE AM AND 3 AT NIGHT, Disp: 120 capsule, Rfl: 0   predniSONE (DELTASONE) 20 MG tablet, Take 2 tablets (40 mg total) by mouth daily with breakfast., Disp: 10 tablet, Rfl: 0   traZODone (DESYREL) 50 MG tablet, Take 0.5 tablets (25 mg total) by mouth at bedtime  as needed for sleep., Disp: 15 tablet, Rfl: 1  Observations/Objective: Patient is well-developed, well-nourished in no acute distress.  Resting comfortably  at home.  Head is normocephalic, atraumatic.  No labored breathing.  Speech is clear and coherent with logical content.  Patient is alert and oriented at baseline.    Assessment and Plan:   1. Body piercing  - sulfamethoxazole-trimethoprim (BACTRIM DS) 800-160 MG tablet; Take 1 tablet by mouth 2 (two) times daily for 7 days.  Dispense: 14 tablet; Refill: 0 - mupirocin ointment (BACTROBAN) 2 %; Apply 1 Application topically 2 (two) times daily.  Dispense: 22 g; Refill: 0  2. Bacterial skin infection  - mupirocin ointment (BACTROBAN) 2 %; Apply 1 Application topically 2 (two) times daily.  Dispense: 22 g; Refill: 0  With infected piercing, post Augmentin for infection and asthma. Will do Bactrim (reports Doxy does not help her)   Advised she needs to be followed up for allergy and asthma issues given the on going need for rescue inhaler   Increase fluids.  Rest.  Saline  nasal spray.  Mucinex as directed.  Humidifier in bedroom.  Call or return to clinic if symptoms are not improving.   Follow Up Instructions: I discussed the assessment and treatment plan with the patient. The patient was provided an opportunity to ask questions and all were answered. The patient agreed with the plan and demonstrated an understanding of the instructions.  A copy of instructions were sent to the patient via MyChart unless otherwise noted below.    The patient was advised to call back or seek an in-person evaluation if the symptoms worsen or if the condition fails to improve as anticipated.  Time:  I spent 10 minutes with the patient via telehealth technology discussing the above problems/concerns.    Freddy FinnerHannah M Conrado Nance, NP

## 2022-05-11 NOTE — Telephone Encounter (Signed)
Patient states she is still having breathing issues asking for a prescription for prednisone asking for a nurse to call her please advise

## 2022-05-12 MED ORDER — PREDNISONE 10 MG PO TABS: ORAL_TABLET | ORAL | 0 refills | Status: DC

## 2022-05-12 NOTE — Telephone Encounter (Signed)
Patient calling letting us know her breathing is doing some better, but still struggling with her breathing. She doesn't have money enough to get an uber to go to urgent care or ed. She said her mom could bring her prednisone when she gets off work. Please refer to prior messages.

## 2022-05-12 NOTE — Telephone Encounter (Signed)
Patient is calling back waiting for a response concerning prednisone. Please advise.

## 2022-05-12 NOTE — Telephone Encounter (Signed)
I will send in a course of prednisone.   Did she ever start the Dupixent?   Malachi Bonds, MD [facility]

## 2022-05-12 NOTE — Addendum Note (Signed)
Addended by: Alfonse Spruce on: 05/12/2022 06:52 PM   Modules accepted: Orders

## 2022-05-16 ENCOUNTER — Other Ambulatory Visit (HOSPITAL_COMMUNITY): Payer: Self-pay | Admitting: Psychiatry

## 2022-05-18 ENCOUNTER — Telehealth: Payer: Medicaid Other | Admitting: Nurse Practitioner

## 2022-05-18 DIAGNOSIS — L089 Local infection of the skin and subcutaneous tissue, unspecified: Secondary | ICD-10-CM

## 2022-05-18 MED ORDER — SULFAMETHOXAZOLE-TRIMETHOPRIM 800-160 MG PO TABS: 1.0000 | ORAL_TABLET | Freq: Two times a day (BID) | ORAL | 0 refills | Status: AC

## 2022-05-18 NOTE — Progress Notes (Signed)
Virtual Visit Consent   [name], you are scheduled for a virtual visit with a [facility] provider today. Just as with appointments in the office, your consent must be obtained to participate. Your consent will be active for this visit and any virtual visit you may have with one of our providers in the next 365 days. If you have a MyChart account, a copy of this consent can be sent to you electronically.  As this is a virtual visit, video technology does not allow for your provider to perform a traditional examination. This may limit your provider's ability to fully assess your condition. If your provider identifies any concerns that need to be evaluated in person or the need to arrange testing (such as labs, EKG, etc.), we will make arrangements to do so. Although advances in technology are sophisticated, we cannot ensure that it will always work on either your end or our end. If the connection with a video visit is poor, the visit may have to be switched to a telephone visit. With either a video or telephone visit, we are not always able to ensure that we have a secure connection.  By engaging in this virtual visit, you consent to the provision of healthcare and authorize for your insurance to be billed (if applicable) for the services provided during this visit. Depending on your insurance coverage, you may receive a charge related to this service.  I need to obtain your verbal consent now. Are you willing to proceed with your visit today? [name] has provided verbal consent on 05/18/2022 for a virtual visit (video or telephone). [name], FNP  Date: 05/18/2022 7:05 PM  Virtual Visit via Video Note   I, [name], connected with  [name]  ([id number], 1985/02/14) on 05/18/22 at  7:15 PM EDT by a video-enabled telemedicine application and verified that I am speaking with the correct person using two identifiers.  Location: Patient: Virtual Visit Location Patient:  Home Provider: Virtual Visit Location Provider: Home Office   I discussed the limitations of evaluation and management by telemedicine and the availability of in person appointments. The patient expressed understanding and agreed to proceed.    History of Present Illness: [name] is a 37 y.o. who identifies as a female who was assigned female at birth, and is being seen today for ongoing piercing infection (left face/ cheek). See previous note  This is not a new piercing  It has been infected in the past   She was started on Bactrim 7 days ago. Is scheduled to finish today and feels she has made good improvement without resolution of symptoms.   She has also been using the topical Bactroban as well.   Denies fever  Denies new or worsening symptoms   Requesting extension of antibiotics to assure resolution of symptoms   She is also on a course of prednisone from her allergy doctor at this time   Problems:  Patient Active Problem List   Diagnosis Date Noted   Smoker 03/20/2022   HTN (hypertension) 03/20/2022   Vaginitis 03/20/2022   Suicide ideation 02/18/2022   Substance abuse 02/18/2022   Bipolar 1 disorder, manic, full remission 02/18/2022   Abscess of external cheek, left 01/01/2022   Rash 12/25/2021   Asthma exacerbation 11/26/2021   STD exposure 11/26/2021   Insect bite 11/26/2021   Bipolar affective disorder 11/13/2021   Encounter for well adult exam with abnormal findings 11/12/2021   Cellulitis 11/12/2021  Vaginal discharge 08/26/2021   BRBPR (bright red blood per rectum) 02/27/2021   Anal fissure 02/27/2021   Facial abscess 02/12/2021   Folliculitis 12/21/2020   Bipolar 2 disorder, major depressive episode 12/20/2020   Vitamin D deficiency 07/15/2020   B12 deficiency 07/15/2020   MDD (major depressive disorder), recurrent severe, without psychosis 04/26/2020   PTSD (post-traumatic stress disorder) 01/03/2020   Allergic asthma 01/13/2018   Allergic  rhinitis 03/12/2017   Class 2 obesity due to excess calories without serious comorbidity with body mass index (BMI) of 37.0 to 37.9 in adult 04/17/2016   Acute asthmatic bronchitis 03/13/2016   Borderline personality disorder in adult 03/13/2016   Mood disorder 03/04/2013    Allergies:  Allergies  Allergen Reactions   Benztropine Other (See Comments)    agitation     Other Other (See Comments)    Opiates- history of dependency    Lamictal [Lamotrigine] Rash   Medications:  Current Outpatient Medications:    albuterol (VENTOLIN HFA) 108 (90 Base) MCG/ACT inhaler, Inhale 2 puffs into the lungs every 6 (six) hours as needed for wheezing or shortness of breath., Disp: 8 g, Rfl: 0   amLODipine (NORVASC) 5 MG tablet, Take 1 tablet (5 mg total) by mouth daily., Disp: 90 tablet, Rfl: 3   calcium carbonate (OS-CAL - DOSED IN MG OF ELEMENTAL CALCIUM) 1250 (500 Ca) MG tablet, Take 1 tablet (1,250 mg total) by mouth daily with breakfast., Disp: 30 tablet, Rfl: 0   ferrous sulfate 325 (65 FE) MG tablet, Take 1 tablet (325 mg total) by mouth daily., Disp: 30 tablet, Rfl: 0   gabapentin (NEURONTIN) 100 MG capsule, Take 1 capsule (100 mg total) by mouth every 8 (eight) hours., Disp: 90 capsule, Rfl: 0   hydrOXYzine (ATARAX) 50 MG tablet, Take 1 tablet (50 mg total) by mouth 2 (two) times daily as needed for anxiety., Disp: 60 tablet, Rfl: 1   levonorgestrel-ethinyl estradiol (NORDETTE) 0.15-30 MG-MCG tablet, Take 1 tablet by mouth at bedtime., Disp: 84 tablet, Rfl: 1   montelukast (SINGULAIR) 10 MG tablet, Take 1 tablet (10 mg total) by mouth at bedtime., Disp: 30 tablet, Rfl: 5   mupirocin ointment (BACTROBAN) 2 %, Apply 1 Application topically 2 (two) times daily., Disp: 22 g, Rfl: 0   nicotine (NICODERM CQ - DOSED IN MG/24 HOURS) 14 mg/24hr patch, Place 1 patch (14 mg total) onto the skin daily., Disp: 28 patch, Rfl: 5   OLANZapine zydis (ZYPREXA) 5 MG disintegrating tablet, Take 1 tablet (5 mg  total) by mouth at bedtime., Disp: 30 tablet, Rfl: 0   OXcarbazepine (TRILEPTAL) 300 MG tablet, Take 1 tablet (300 mg total) by mouth 2 (two) times daily., Disp: 60 tablet, Rfl: 1   paliperidone (INVEGA) 6 MG 24 hr tablet, Take 1 tablet (6 mg total) by mouth daily., Disp: 30 tablet, Rfl: 1   pantoprazole (PROTONIX) 40 MG tablet, Take 1 tablet (40 mg total) by mouth daily., Disp: 30 tablet, Rfl: 0   prazosin (MINIPRESS) 1 MG capsule, Take 1 capsule (1 mg total) by mouth as directed. TAKE ONE IN THE AM AND 3 AT NIGHT, Disp: 120 capsule, Rfl: 0   predniSONE (DELTASONE) 10 MG tablet, Take 3 tabs ( ) twice daily for 3 days, then 2 tabs ( ) twice daily for 3 days, then 1 tab ( ) twice daily for 3 days, then STOP., Disp: 36 tablet, Rfl: 0   sulfamethoxazole-trimethoprim (BACTRIM DS) 800-160 MG tablet, Take 1 tablet by mouth 2 (two) times daily  for 7 days., Disp: 14 tablet, Rfl: 0   traZODone (DESYREL) 50 MG tablet, Take 0.5 tablets (25 mg total) by mouth at bedtime as needed for sleep., Disp: 15 tablet, Rfl: 1  Observations/Objective: Patient is well-developed, well-nourished in no acute distress.  Resting comfortably  at home.  Head is normocephalic, atraumatic.  No labored breathing.  Speech is clear and coherent with logical content.  Patient is alert and oriented at baseline.  Left cheek piercing intact no active drainage   Assessment and Plan:  1. Skin infection  May continue topical as well until symptoms resolve   - sulfamethoxazole-trimethoprim (BACTRIM DS) 800-160 MG tablet; Take 1 tablet by mouth 2 (two) times daily for 3 days.  Dispense: 6 tablet; Refill: 0    If symptoms persist beyond the total 10 days of oral antibiotics would recommend in person follow up for evaluation   Follow Up Instructions: I discussed the assessment and treatment plan with the patient. The patient was provided an opportunity to ask questions and all were answered. The patient agreed with the plan  and demonstrated an understanding of the instructions.  A copy of instructions were sent to the patient via MyChart unless otherwise noted below.    The patient was advised to call back or seek an in-person evaluation if the symptoms worsen or if the condition fails to improve as anticipated.  Time:  I spent 15 minutes with the patient via telehealth technology discussing the above problems/concerns.    [name], FNP

## 2022-05-19 ENCOUNTER — Encounter (HOSPITAL_COMMUNITY): Payer: Self-pay

## 2022-05-19 ENCOUNTER — Ambulatory Visit (HOSPITAL_COMMUNITY)
Admission: EM | Admit: 2022-05-19 | Discharge: 2022-05-19 | Disposition: A | Discharge status: Home / Self Care | Payer: Medicaid Other | Attending: Physician Assistant | Admitting: Physician Assistant

## 2022-05-19 ENCOUNTER — Telehealth: Payer: Medicaid Other | Admitting: Family Medicine

## 2022-05-19 DIAGNOSIS — T3695XA Adverse effect of unspecified systemic antibiotic, initial encounter: Secondary | ICD-10-CM | POA: Diagnosis present

## 2022-05-19 DIAGNOSIS — Z91199 Patient's noncompliance with other medical treatment and regimen due to unspecified reason: Secondary | ICD-10-CM

## 2022-05-19 DIAGNOSIS — B379 Candidiasis, unspecified: Secondary | ICD-10-CM | POA: Insufficient documentation

## 2022-05-19 DIAGNOSIS — Z113 Encounter for screening for infections with a predominantly sexual mode of transmission: Secondary | ICD-10-CM | POA: Insufficient documentation

## 2022-05-19 DIAGNOSIS — Z202 Contact with and (suspected) exposure to infections with a predominantly sexual mode of transmission: Secondary | ICD-10-CM

## 2022-05-19 LAB — HIV ANTIBODY (ROUTINE TESTING W REFLEX): HIV Screen 4th Generation wRfx: NONREACTIVE

## 2022-05-19 MED ORDER — FLUCONAZOLE 150 MG PO TABS: 150.0000 mg | ORAL_TABLET | Freq: Once | ORAL | 0 refills | Status: AC

## 2022-05-19 MED ORDER — DOXYCYCLINE HYCLATE 100 MG PO CAPS: 100.0000 mg | ORAL_CAPSULE | Freq: Two times a day (BID) | ORAL | 0 refills | Status: AC

## 2022-05-19 MED ORDER — CEFTRIAXONE SODIUM 500 MG IJ SOLR
INTRAMUSCULAR | Status: AC
  Filled 2022-05-19: qty 500

## 2022-05-19 MED ORDER — CEFTRIAXONE SODIUM 500 MG IJ SOLR
500.0000 mg | INTRAMUSCULAR | Status: DC
  Administered 2022-05-19: 500 mg via INTRAMUSCULAR

## 2022-05-19 NOTE — Progress Notes (Signed)
The patient no-showed for appointment despite this provider sending direct link, reaching out via phone with no response and waiting for at least 10 minutes from appointment time for patient to join. They will be marked as a NS for this appointment/time.   Irby Fails M Tyrann Donaho, NP    

## 2022-05-19 NOTE — Discharge Instructions (Signed)
We treated you for gonorrhea today.  Start doxycycline 100 mg twice daily for 7 days to cover for chlamydia.  Stay out of sun while on this medication.  I have called in 1 dose of Diflucan in case you develop yeast infection symptoms.  You should abstain from sex for 7 days after completing course of treatment (a minimum of 2 weeks if positive for chlamydia).  We will contact you if need to arrange additional treatment.  You should use a condom for sexual encounter.  As we discussed, antibiotics can decrease the effectiveness of your birth control so please use backup birth control while on this medication.  If you have any worsening symptoms including abdominal pain, pelvic pain, fever, nausea, vomiting you need to be seen immediately.

## 2022-05-19 NOTE — ED Provider Notes (Signed)
[facility]    CSN: [id number] Arrival date & time: 05/19/22  1536      History   Chief Complaint Chief Complaint  Patient presents with   SEXUALLY TRANSMITTED DISEASE    A sexual partner is being treated for an std. Need to get tested and get meds - Entered by patient    HPI [name] is a 37 y.o. female.   Patient presents today with a concern for sexually transmitted infection.  She reports that her partner recently contacted her and told her that they are being treated for gonorrhea and chlamydia.  She last had sexual contact with this individual approximately 3 to 4 weeks ago.  She does report some mild vaginal irritation and discharge.  She denies history of STI.  She is currently taking Bactrim for a skin infection that did not respond to doxycycline but denies additional antibiotics in the past 90 days.  She is confident that she is not pregnant if she is compliant with her oral contraceptives and has regular periods.  She denies any fever, pelvic pain, abdominal pain, nausea/vomiting interfering with oral intake.  She is requesting treatment today given her known exposure.  She is also requesting a prescription for Diflucan if she is prescribed antibiotics that she often gets yeast infection.  She is interested in complete STI panel.    Past Medical History:  Diagnosis Date   Allergy    Anxiety    Asthma    Bipolar 2 disorder    Borderline personality disorder    Depression    Eczema    Former smoker    Obesity    PTSD (post-traumatic stress disorder)    Urticaria     Patient Active Problem List   Diagnosis Date Noted   Smoker 03/20/2022   HTN (hypertension) 03/20/2022   Vaginitis 03/20/2022   Suicide ideation 02/18/2022   Substance abuse 02/18/2022   Bipolar 1 disorder, manic, full remission 02/18/2022   Abscess of external cheek, left 01/01/2022   Rash 12/25/2021   Asthma exacerbation 11/26/2021   STD exposure 11/26/2021   Insect bite  11/26/2021   Bipolar affective disorder 11/13/2021   Encounter for well adult exam with abnormal findings 11/12/2021   Cellulitis 11/12/2021   Vaginal discharge 08/26/2021   BRBPR (bright red blood per rectum) 02/27/2021   Anal fissure 02/27/2021   Facial abscess 02/12/2021   Folliculitis 12/21/2020   Bipolar 2 disorder, major depressive episode 12/20/2020   Vitamin D deficiency 07/15/2020   B12 deficiency 07/15/2020   MDD (major depressive disorder), recurrent severe, without psychosis 04/26/2020   PTSD (post-traumatic stress disorder) 01/03/2020   Allergic asthma 01/13/2018   Allergic rhinitis 03/12/2017   Class 2 obesity due to excess calories without serious comorbidity with body mass index (BMI) of 37.0 to 37.9 in adult 04/17/2016   Acute asthmatic bronchitis 03/13/2016   Borderline personality disorder in adult 03/13/2016   Mood disorder 03/04/2013    Past Surgical History:  Procedure Laterality Date   Thumb surgery Left     OB History     Gravida  0   Para  0   Term  0   Preterm  0   AB  0   Living  0      SAB  0   IAB  0   Ectopic  0   Multiple  0   Live Births  0            Home  Medications    Prior to Admission medications   Medication Sig Start Date End Date Taking? Authorizing Provider  albuterol (VENTOLIN HFA) 108 (90 Base) MCG/ACT inhaler Inhale 2 puffs into the lungs every 6 (six) hours as needed for wheezing or shortness of breath. 04/09/22  Yes Waldon Merl, PA-C  amLODipine (NORVASC) 5 MG tablet Take 1 tablet (5 mg total) by mouth daily. 03/20/22  Yes Corwin Levins, MD  doxycycline (VIBRAMYCIN) 100 MG capsule Take 1 capsule (100 mg total) by mouth 2 (two) times daily for 7 days. 05/19/22 05/26/22 Yes Lilymarie Scroggins K, PA-C  ferrous sulfate 325 (65 FE) MG tablet Take 1 tablet (325 mg total) by mouth daily. 02/23/22  Yes Starleen Blue, NP  fluconazole (DIFLUCAN) 150 MG tablet Take 1 tablet (150 mg total) by mouth once for 1 dose.  05/19/22 05/19/22 Yes Angelys Yetman K, PA-C  gabapentin (NEURONTIN) 100 MG capsule Take 1 capsule (100 mg total) by mouth every 8 (eight) hours. 04/21/22  Yes Thresa Ross, MD  hydrOXYzine (ATARAX) 50 MG tablet Take 1 tablet (50 mg total) by mouth 2 (two) times daily as needed for anxiety. 03/11/22  Yes Thresa Ross, MD  levonorgestrel-ethinyl estradiol (NORDETTE) 0.15-30 MG-MCG tablet Take 1 tablet by mouth at bedtime. 02/10/22  Yes Earlene Plater, Tiffany A, NP  montelukast (SINGULAIR) 10 MG tablet Take 1 tablet (10 mg total) by mouth at bedtime. 04/24/22  Yes Alfonse Spruce, MD  mupirocin ointment (BACTROBAN) 2 % Apply 1 Application topically 2 (two) times daily. 05/11/22  Yes Freddy Finner, NP  OLANZapine zydis (ZYPREXA) 5 MG disintegrating tablet Take 1 tablet (5 mg total) by mouth at bedtime. 04/21/22  Yes Thresa Ross, MD  OXcarbazepine (TRILEPTAL) 300 MG tablet Take 1 tablet (300 mg total) by mouth 2 (two) times daily. 03/11/22  Yes Thresa Ross, MD  paliperidone (INVEGA) 6 MG 24 hr tablet Take 1 tablet (6 mg total) by mouth daily. 03/11/22  Yes Thresa Ross, MD  pantoprazole (PROTONIX) 40 MG tablet Take 1 tablet (40 mg total) by mouth daily. 02/23/22  Yes Starleen Blue, NP  prazosin (MINIPRESS) 1 MG capsule Take 1 capsule (1 mg total) by mouth as directed. TAKE ONE IN THE AM AND 3 AT NIGHT 04/24/22  Yes Thresa Ross, MD  predniSONE (DELTASONE) 10 MG tablet Take 3 tabs ( ) twice daily for 3 days, then 2 tabs ( ) twice daily for 3 days, then 1 tab ( ) twice daily for 3 days, then STOP. 05/12/22  Yes Alfonse Spruce, MD  sulfamethoxazole-trimethoprim (BACTRIM DS) 800-160 MG tablet Take 1 tablet by mouth 2 (two) times daily for 3 days. 05/18/22 05/21/22 Yes [name], FNP  traZODone (DESYREL) 50 MG tablet Take 0.5 tablets (25 mg total) by mouth at bedtime as needed for sleep. 03/11/22  Yes Thresa Ross, MD  calcium carbonate (OS-CAL - DOSED IN MG OF ELEMENTAL CALCIUM) 1250 (500 Ca) MG  tablet Take 1 tablet (1,250 mg total) by mouth daily with breakfast. 02/23/22   Starleen Blue, NP  nicotine (NICODERM CQ - DOSED IN MG/24 HOURS) 14 mg/24hr patch Place 1 patch (14 mg total) onto the skin daily. 03/20/22   Corwin Levins, MD    Family History Family History  Problem Relation Age of Onset   Healthy Mother    Healthy Father    Alzheimer's disease Maternal Grandmother    Skin cancer Paternal Grandmother    Allergic rhinitis Paternal Grandmother    Asthma Paternal Grandmother  Angioedema Neg Hx    Eczema Neg Hx    Urticaria Neg Hx     Social History Social History   Tobacco Use   Smoking status: Every Day    Packs/day: 1.00    Years: 9.00    Additional pack years: 0.00    Total pack years: 9.00    Types: Cigarettes    Passive exposure: Current   Smokeless tobacco: Never  Vaping Use   Vaping Use: Former  Substance Use Topics   Alcohol use: Yes    Alcohol/week: 6.0 - 12.0 standard drinks of alcohol    Types: 6 - 12 Cans of beer per week   Drug use: Yes    Frequency: 4.0 times per week    Types: Marijuana, "Crack" cocaine    Comment: Hx of opioid dependency     Allergies   Benztropine, Other, and Lamictal [lamotrigine]   Review of Systems Review of Systems  Constitutional:  Positive for activity change. Negative for appetite change, fatigue and fever.  Gastrointestinal:  Negative for abdominal pain, diarrhea, nausea and vomiting.  Genitourinary:  Positive for vaginal discharge. Negative for dysuria, frequency, pelvic pain, urgency, vaginal bleeding and vaginal pain.     Physical Exam Triage Vital Signs ED Triage Vitals [05/19/22 1625]  Enc Vitals Group     BP 117/88     Pulse Rate (!) 122     Resp 18     Temp 97.8 F (36.6 C)     Temp Source Oral     SpO2 93 %     Weight      Height      Head Circumference      Peak Flow      Pain Score      Pain Loc      Pain Edu?      Excl. in GC?    No data found.  Updated Vital Signs BP 117/88  (BP Location: Left Arm)   Pulse 96   Temp 97.8 F (36.6 C) (Oral)   Resp 18   SpO2 96%   Visual Acuity Right Eye Distance:   Left Eye Distance:   Bilateral Distance:    Right Eye Near:   Left Eye Near:    Bilateral Near:     Physical Exam Vitals reviewed.  Constitutional:      General: She is awake. She is not in acute distress.    Appearance: Normal appearance. She is well-developed. She is not ill-appearing.     Comments: Very pleasant female appears stated age in no acute distress sitting comfortably in exam room  HENT:     Head: Normocephalic and atraumatic.  Cardiovascular:     Rate and Rhythm: Normal rate and regular rhythm.     Heart sounds: Normal heart sounds, S1 normal and S2 normal. No murmur heard. Pulmonary:     Effort: Pulmonary effort is normal.     Breath sounds: Wheezing present. No rhonchi or rales.     Comments: Fine scattered wheezing. Abdominal:     General: Bowel sounds are normal.     Palpations: Abdomen is soft.     Tenderness: There is no abdominal tenderness. There is no right CVA tenderness, left CVA tenderness, guarding or rebound.     Comments: Denied abdominal exam  Psychiatric:        Behavior: Behavior is cooperative.      UC Treatments / Results  Labs (all labs ordered are listed, but only abnormal results are  displayed) Labs Reviewed  RPR  HIV ANTIBODY (ROUTINE TESTING W REFLEX)  CERVICOVAGINAL ANCILLARY ONLY    EKG   Radiology No results found.  Procedures Procedures (including critical care time)  Medications Ordered in UC Medications  cefTRIAXone (ROCEPHIN) injection 500 mg (has no administration in time range)    Initial Impression / Assessment and Plan / UC Course  I have reviewed the triage vital signs and the nursing notes.  Pertinent labs & imaging results that were available during my care of the patient were reviewed by me and considered in my medical decision making (see chart for details).      Patient was tachycardic on intake but this normalized with retake.  She is otherwise well-appearing, afebrile, nontoxic.  She is confident she is not pregnant so urine pregnancy was deferred.  Given known exposure to gonorrhea and chlamydia with associated vaginal discharge will treat empirically.  She was given 500 mg of Rocephin in clinic as well as doxycycline 100 mg twice daily for 7 days.  She was given 1 dose of Diflucan to have on hand in case she develops yeast infection symptoms.  Discussed that antibiotics can decrease the effectiveness of birth control and she should use backup birth control on these medications.  We discussed that she is to abstain from sex for 7 days after completing course of treatment.  STI swab was collected and is pending.  HIV and syphilis testing was also obtained.  We discussed the importance of safe sex practices.  If she has any worsening or changing symptoms including abdominal pain, pelvic pain, fever, nausea, vomiting she needs to be seen immediately.  Final Clinical Impressions(s) / UC Diagnoses   Final diagnoses:  Exposure to gonorrhea  Exposure to chlamydia  Screening examination for STI  Antibiotic-induced yeast infection     Discharge Instructions      We treated you for gonorrhea today.  Start doxycycline 100 mg twice daily for 7 days to cover for chlamydia.  Stay out of sun while on this medication.  I have called in 1 dose of Diflucan in case you develop yeast infection symptoms.  You should abstain from sex for 7 days after completing course of treatment (a minimum of 2 weeks if positive for chlamydia).  We will contact you if need to arrange additional treatment.  You should use a condom for sexual encounter.  As we discussed, antibiotics can decrease the effectiveness of your birth control so please use backup birth control while on this medication.  If you have any worsening symptoms including abdominal pain, pelvic pain, fever, nausea,  vomiting you need to be seen immediately.     ED Prescriptions     Medication Sig Dispense Auth. Provider   doxycycline (VIBRAMYCIN) 100 MG capsule Take 1 capsule (100 mg total) by mouth 2 (two) times daily for 7 days. 14 capsule Ehren Berisha K, PA-C   fluconazole (DIFLUCAN) 150 MG tablet Take 1 tablet (150 mg total) by mouth once for 1 dose. 1 tablet Mekaylah Klich, Noberto Retort, PA-C      PDMP not reviewed this encounter.   Jeani Hawking, PA-C 05/19/22 1700

## 2022-05-19 NOTE — ED Triage Notes (Signed)
Pt presents to the office for STD testing. Pt states her partner is being treated for GC/CH.   Pt states she has a date on Friday and would like to be treated/ cleared today.

## 2022-05-20 LAB — CERVICOVAGINAL ANCILLARY ONLY
Bacterial Vaginitis (gardnerella): POSITIVE — AB
Candida Glabrata: NEGATIVE
Candida Vaginitis: NEGATIVE
Chlamydia: NEGATIVE
Comment: NEGATIVE
Comment: NEGATIVE
Comment: NEGATIVE
Comment: NEGATIVE
Comment: NEGATIVE
Comment: NORMAL
Neisseria Gonorrhea: NEGATIVE
Trichomonas: NEGATIVE

## 2022-05-20 LAB — RPR: RPR Ser Ql: NONREACTIVE

## 2022-05-21 ENCOUNTER — Telehealth (HOSPITAL_COMMUNITY): Payer: Self-pay

## 2022-05-21 ENCOUNTER — Telehealth (HOSPITAL_COMMUNITY): Payer: Self-pay | Admitting: Emergency Medicine

## 2022-05-21 MED ORDER — METRONIDAZOLE 500 MG PO TABS: 500.0000 mg | ORAL_TABLET | Freq: Two times a day (BID) | ORAL | 0 refills | Status: DC

## 2022-05-25 ENCOUNTER — Telehealth (HOSPITAL_COMMUNITY): Payer: Self-pay | Admitting: Emergency Medicine

## 2022-05-25 MED ORDER — METRONIDAZOLE 0.75 % VA GEL: 1.0000 | Freq: Every day | VAGINAL | 0 refills | Status: AC

## 2022-05-25 NOTE — Telephone Encounter (Signed)
Patient preferred metrogel, sent, per protocol

## 2022-05-29 ENCOUNTER — Ambulatory Visit: Payer: Medicaid Other | Admitting: Internal Medicine

## 2022-06-23 ENCOUNTER — Other Ambulatory Visit: Payer: Self-pay | Admitting: Internal Medicine

## 2022-06-23 ENCOUNTER — Telehealth: Payer: Self-pay | Admitting: Allergy & Immunology

## 2022-06-23 ENCOUNTER — Telehealth: Payer: Medicaid Other | Admitting: Physician Assistant

## 2022-06-23 DIAGNOSIS — U071 COVID-19: Secondary | ICD-10-CM | POA: Diagnosis not present

## 2022-06-23 MED ORDER — LAGEVRIO 200 MG PO CAPS: 4.0000 | ORAL_CAPSULE | Freq: Two times a day (BID) | ORAL | 0 refills | Status: AC

## 2022-06-23 MED ORDER — NIRMATRELVIR/RITONAVIR (PAXLOVID)TABLET: 3.0000 | ORAL_TABLET | Freq: Two times a day (BID) | ORAL | 0 refills | Status: DC

## 2022-06-23 MED ORDER — PREDNISONE 20 MG PO TABS: 40.0000 mg | ORAL_TABLET | Freq: Every day | ORAL | 0 refills | Status: AC

## 2022-06-23 NOTE — Progress Notes (Signed)
Spoke with patient after hours. She reports that her COVID home testing was positive.   She did speak with an UC provider who prescribed her paxlovid and prednisone.  However, she has multiple potential drug drug interactions with prazosin and trazodone being listed as drugs that should avoided when taking paxlovid. She is not actively taking trazodone but takes prazosin twice daily.  I have therefore recommended molnupiravir at 800 mg twice daily for 5 days. She will take this in place of paxlovid.  I confirmed she is not pregnant nor planning on becoming pregnant (on birth control).

## 2022-06-23 NOTE — Telephone Encounter (Signed)
[name] called in and states she was exposed to covid by a friend that tested positive yesterday.  [name] states starting today she is feeling very achy, tired, has a headache, and sore throat.  She states she thinks she has covid but has not been able to test herself. [name] states she knows she has a fever but her thermometer is broken and can't confirm.  [name] would like to know what her treatment options are.  She stated the last time she had covid she received the Monoclonal Antibiodies and that really helped and didn't know if that was still an option.  Please advise.

## 2022-06-23 NOTE — Patient Instructions (Signed)
[name], thank you for joining Tylene Fantasia Ward, PA-C for today's virtual visit.  While this provider is not your primary care provider (PCP), if your PCP is located in our provider database this encounter information will be shared with them immediately following your visit.   A [facility] MyChart account gives you access to today's visit and all your visits, tests, and labs performed at [facility] " click here if you don't have a [facility] MyChart account or go to [email or web address]  Consent: (Patient) [name] provided verbal consent for this virtual visit at the beginning of the encounter.  Current Medications:  Current Outpatient Medications:    nirmatrelvir/ritonavir (PAXLOVID) 20 x 150 MG & 10 x 100MG  TABS, Take 3 tablets by mouth 2 (two) times daily for 5 days. (Take nirmatrelvir 150 mg two tablets twice daily for 5 days and ritonavir 100 mg one tablet twice daily for 5 days) Patient GFR is >60, Disp: 30 tablet, Rfl: 0   predniSONE (DELTASONE) 20 MG tablet, Take 2 tablets (40 mg total) by mouth daily with breakfast for 5 days., Disp: 10 tablet, Rfl: 0   albuterol (VENTOLIN HFA) 108 (90 Base) MCG/ACT inhaler, Inhale 2 puffs into the lungs every 6 (six) hours as needed for wheezing or shortness of breath., Disp: 8 g, Rfl: 0   amLODipine (NORVASC) 5 MG tablet, Take 1 tablet (5 mg total) by mouth daily., Disp: 90 tablet, Rfl: 3   calcium carbonate (OS-CAL - DOSED IN MG OF ELEMENTAL CALCIUM) 1250 (500 Ca) MG tablet, Take 1 tablet (1,250 mg total) by mouth daily with breakfast., Disp: 30 tablet, Rfl: 0   ferrous sulfate 325 (65 FE) MG tablet, Take 1 tablet (325 mg total) by mouth daily., Disp: 30 tablet, Rfl: 0   gabapentin (NEURONTIN) 100 MG capsule, Take 1 capsule (100 mg total) by mouth every 8 (eight) hours., Disp: 90 capsule, Rfl: 0   hydrOXYzine (ATARAX) 50 MG tablet, Take 1 tablet (50 mg total) by mouth 2 (two) times daily as needed for anxiety., Disp:  60 tablet, Rfl: 1   levonorgestrel-ethinyl estradiol (NORDETTE) 0.15-30 MG-MCG tablet, Take 1 tablet by mouth at bedtime., Disp: 84 tablet, Rfl: 1   metroNIDAZOLE (FLAGYL) 500 MG tablet, Take 1 tablet (500 mg total) by mouth 2 (two) times daily., Disp: 14 tablet, Rfl: 0   montelukast (SINGULAIR) 10 MG tablet, Take 1 tablet (10 mg total) by mouth at bedtime., Disp: 30 tablet, Rfl: 5   mupirocin ointment (BACTROBAN) 2 %, Apply 1 Application topically 2 (two) times daily., Disp: 22 g, Rfl: 0   nicotine (NICODERM CQ - DOSED IN MG/24 HOURS) 14 mg/24hr patch, Place 1 patch (14 mg total) onto the skin daily., Disp: 28 patch, Rfl: 5   OLANZapine zydis (ZYPREXA) 5 MG disintegrating tablet, Take 1 tablet (5 mg total) by mouth at bedtime., Disp: 30 tablet, Rfl: 0   OXcarbazepine (TRILEPTAL) 300 MG tablet, Take 1 tablet (300 mg total) by mouth 2 (two) times daily., Disp: 60 tablet, Rfl: 1   paliperidone (INVEGA) 6 MG 24 hr tablet, Take 1 tablet (6 mg total) by mouth daily., Disp: 30 tablet, Rfl: 1   pantoprazole (PROTONIX) 40 MG tablet, Take 1 tablet (40 mg total) by mouth daily., Disp: 30 tablet, Rfl: 0   prazosin (MINIPRESS) 1 MG capsule, Take 1 capsule (1 mg total) by mouth as directed. TAKE ONE IN THE AM AND 3 AT NIGHT, Disp: 120 capsule, Rfl: 0   traZODone (DESYREL)  50 MG tablet, Take 0.5 tablets (25 mg total) by mouth at bedtime as needed for sleep., Disp: 15 tablet, Rfl: 1   Medications ordered in this encounter:  Meds ordered this encounter  Medications   predniSONE (DELTASONE) 20 MG tablet    Sig: Take 2 tablets (40 mg total) by mouth daily with breakfast for 5 days.    Dispense:  10 tablet    Refill:  0    Order Specific Question:   Supervising Provider    Answer:   Loreli Dollar   nirmatrelvir/ritonavir (PAXLOVID) 20 x 150 MG & 10 x 100MG  TABS    Sig: Take 3 tablets by mouth 2 (two) times daily for 5 days. (Take nirmatrelvir 150 mg two tablets twice daily for 5 days and ritonavir  100 mg one tablet twice daily for 5 days) Patient GFR is >60    Dispense:  30 tablet    Refill:  0    Order Specific Question:   Supervising Provider    Answer:   Merrilee Jansky [id number]     *If you need refills on other medications prior to your next appointment, please contact your pharmacy*  Follow-Up: Call back or seek an in-person evaluation if the symptoms worsen or if the condition fails to improve as anticipated.  [facility] [phone]  Other Instructions Take paxlovid as prescribed.  Take prednisone as prescribed.  Recommend Mucinex and Flonase for congestion.  Recommend Delsym over the counter cough syrup as needed for cough.  Can take Ibuprofen or Tylenol as needed for headache, fever. If no improvement or symptoms become worse please make an appointment with your PCP or go to Urgent Care for an in person evaluation.    If you have been instructed to have an in-person evaluation today at a local Urgent Care facility, please use the link below. It will take you to a list of all of our available [facility], including address, phone number and hours of operation. Please do not delay care.  [facility]  If you or a family member do not have a primary care provider, use the link below to schedule a visit and establish care. When you choose a [facility] primary care physician or advanced practice provider, you gain a long-term partner in health. Find a Primary Care Provider  Learn more about [facility]'s in-office and virtual care options: [facility] - Get Care Now

## 2022-06-23 NOTE — Telephone Encounter (Signed)
Addressed in a separate telephone encounter from the same day.

## 2022-06-23 NOTE — Telephone Encounter (Signed)
I called and spoke with the patient that she does not have a way to get the test and does not want to risk getting anyone else sick. I did provide information on taking the decongestant and using her albuterol inhaler every 4-6 hours. I did advise to see if she can have someone pick up a test and drop it off to her home. Patient verbalized understanding and will call back if she is able to test?

## 2022-06-23 NOTE — Progress Notes (Signed)
Virtual Visit Consent   [name], you are scheduled for a virtual visit with a [facility] provider today. Just as with appointments in the office, your consent must be obtained to participate. Your consent will be active for this visit and any virtual visit you may have with one of our providers in the next 365 days. If you have a MyChart account, a copy of this consent can be sent to you electronically.  As this is a virtual visit, video technology does not allow for your provider to perform a traditional examination. This may limit your provider's ability to fully assess your condition. If your provider identifies any concerns that need to be evaluated in person or the need to arrange testing (such as labs, EKG, etc.), we will make arrangements to do so. Although advances in technology are sophisticated, we cannot ensure that it will always work on either your end or our end. If the connection with a video visit is poor, the visit may have to be switched to a telephone visit. With either a video or telephone visit, we are not always able to ensure that we have a secure connection.  By engaging in this virtual visit, you consent to the provision of healthcare and authorize for your insurance to be billed (if applicable) for the services provided during this visit. Depending on your insurance coverage, you may receive a charge related to this service.  I need to obtain your verbal consent now. Are you willing to proceed with your visit today? [name] has provided verbal consent on 06/23/2022 for a virtual visit (video or telephone). Tylene Fantasia Ward, PA-C  Date: 06/23/2022 6:16 PM  Virtual Visit via Video Note   I, Tylene Fantasia Ward, connected with  [name]  ([id number], 05-04-1985) on 06/23/22 at  6:15 PM EDT by a video-enabled telemedicine application and verified that I am speaking with the correct person using two identifiers.  Location: Patient: Virtual Visit Location Patient:  Home Provider: Virtual Visit Location Provider: Home   I discussed the limitations of evaluation and management by telemedicine and the availability of in person appointments. The patient expressed understanding and agreed to proceed.    History of Present Illness: [name] is a 37 y.o. who identifies as a female who was assigned female at birth, and is being seen today for congestion, cough, fatigue, body aches, sore throat, headache, fever.  She has a h/o asthma and does have an inhaler, she has had to use her inhaler today with some relief. Took a home COVID test today which was positive.  She spoke with her allergist office earlier today who agreed to send in paxlovid if she had a positive test, she was unable to get the home test until after their office closed.  She is requesting paxlovid be sent to pharmacy today.    HPI: HPI  Problems:  Patient Active Problem List   Diagnosis Date Noted   Smoker 03/20/2022   HTN (hypertension) 03/20/2022   Vaginitis 03/20/2022   Suicide ideation 02/18/2022   Substance abuse (HCC) 02/18/2022   Bipolar 1 disorder, manic, full remission (HCC) 02/18/2022   Abscess of external cheek, left 01/01/2022   Rash 12/25/2021   Asthma exacerbation 11/26/2021   STD exposure 11/26/2021   Insect bite 11/26/2021   Bipolar affective disorder (HCC) 11/13/2021   Encounter for well adult exam with abnormal findings 11/12/2021   Cellulitis 11/12/2021   Vaginal discharge 08/26/2021   BRBPR (bright  red blood per rectum) 02/27/2021   Anal fissure 02/27/2021   Facial abscess 02/12/2021   Folliculitis 12/21/2020   Bipolar 2 disorder, major depressive episode (HCC) 12/20/2020   Vitamin D deficiency 07/15/2020   B12 deficiency 07/15/2020   MDD (major depressive disorder), recurrent severe, without psychosis (HCC) 04/26/2020   PTSD (post-traumatic stress disorder) 01/03/2020   Allergic asthma 01/13/2018   Allergic rhinitis 03/12/2017   Class 2 obesity due to  excess calories without serious comorbidity with body mass index (BMI) of 37.0 to 37.9 in adult 04/17/2016   Acute asthmatic bronchitis 03/13/2016   Borderline personality disorder in adult [facility]) 03/13/2016   Mood disorder (HCC) 03/04/2013    Allergies:  Allergies  Allergen Reactions   Benztropine Other (See Comments)    agitation     Other Other (See Comments)    Opiates- history of dependency    Lamictal [Lamotrigine] Rash   Medications:  Current Outpatient Medications:    nirmatrelvir/ritonavir (PAXLOVID) 20 x 150 MG & 10 x 100MG  TABS, Take 3 tablets by mouth 2 (two) times daily for 5 days. (Take nirmatrelvir 150 mg two tablets twice daily for 5 days and ritonavir 100 mg one tablet twice daily for 5 days) Patient GFR is >60, Disp: 30 tablet, Rfl: 0   predniSONE (DELTASONE) 20 MG tablet, Take 2 tablets (40 mg total) by mouth daily with breakfast for 5 days., Disp: 10 tablet, Rfl: 0   albuterol (VENTOLIN HFA) 108 (90 Base) MCG/ACT inhaler, Inhale 2 puffs into the lungs every 6 (six) hours as needed for wheezing or shortness of breath., Disp: 8 g, Rfl: 0   amLODipine (NORVASC) 5 MG tablet, Take 1 tablet (5 mg total) by mouth daily., Disp: 90 tablet, Rfl: 3   calcium carbonate (OS-CAL - DOSED IN MG OF ELEMENTAL CALCIUM) 1250 (500 Ca) MG tablet, Take 1 tablet (1,250 mg total) by mouth daily with breakfast., Disp: 30 tablet, Rfl: 0   ferrous sulfate 325 (65 FE) MG tablet, Take 1 tablet (325 mg total) by mouth daily., Disp: 30 tablet, Rfl: 0   gabapentin (NEURONTIN) 100 MG capsule, Take 1 capsule (100 mg total) by mouth every 8 (eight) hours., Disp: 90 capsule, Rfl: 0   hydrOXYzine (ATARAX) 50 MG tablet, Take 1 tablet (50 mg total) by mouth 2 (two) times daily as needed for anxiety., Disp: 60 tablet, Rfl: 1   levonorgestrel-ethinyl estradiol (NORDETTE) 0.15-30 MG-MCG tablet, Take 1 tablet by mouth at bedtime., Disp: 84 tablet, Rfl: 1   metroNIDAZOLE (FLAGYL) 500 MG tablet, Take 1 tablet (500  mg total) by mouth 2 (two) times daily., Disp: 14 tablet, Rfl: 0   montelukast (SINGULAIR) 10 MG tablet, Take 1 tablet (10 mg total) by mouth at bedtime., Disp: 30 tablet, Rfl: 5   mupirocin ointment (BACTROBAN) 2 %, Apply 1 Application topically 2 (two) times daily., Disp: 22 g, Rfl: 0   nicotine (NICODERM CQ - DOSED IN MG/24 HOURS) 14 mg/24hr patch, Place 1 patch (14 mg total) onto the skin daily., Disp: 28 patch, Rfl: 5   OLANZapine zydis (ZYPREXA) 5 MG disintegrating tablet, Take 1 tablet (5 mg total) by mouth at bedtime., Disp: 30 tablet, Rfl: 0   OXcarbazepine (TRILEPTAL) 300 MG tablet, Take 1 tablet (300 mg total) by mouth 2 (two) times daily., Disp: 60 tablet, Rfl: 1   paliperidone (INVEGA) 6 MG 24 hr tablet, Take 1 tablet (6 mg total) by mouth daily., Disp: 30 tablet, Rfl: 1   pantoprazole (PROTONIX) 40 MG tablet,  Take 1 tablet (40 mg total) by mouth daily., Disp: 30 tablet, Rfl: 0   prazosin (MINIPRESS) 1 MG capsule, Take 1 capsule (1 mg total) by mouth as directed. TAKE ONE IN THE AM AND 3 AT NIGHT, Disp: 120 capsule, Rfl: 0   traZODone (DESYREL) 50 MG tablet, Take 0.5 tablets (25 mg total) by mouth at bedtime as needed for sleep., Disp: 15 tablet, Rfl: 1  Observations/Objective: Patient is well-developed, well-nourished in no acute distress.  Resting comfortably  at home.  Head is normocephalic, atraumatic.  No labored breathing.  Speech is clear and coherent with logical content.  Patient is alert and oriented at baseline.    Assessment and Plan: 1. COVID - predniSONE (DELTASONE) 20 MG tablet; Take 2 tablets (40 mg total) by mouth daily with breakfast for 5 days.  Dispense: 10 tablet; Refill: 0 - nirmatrelvir/ritonavir (PAXLOVID) 20 x 150 MG & 10 x 100MG  TABS; Take 3 tablets by mouth 2 (two) times daily for 5 days. (Take nirmatrelvir 150 mg two tablets twice daily for 5 days and ritonavir 100 mg one tablet twice daily for 5 days) Patient GFR is >60  Dispense: 30 tablet; Refill:  0  Advised follow up with in person evaluation if sx become worse.  Supportive care discussed.   Follow Up Instructions: I discussed the assessment and treatment plan with the patient. The patient was provided an opportunity to ask questions and all were answered. The patient agreed with the plan and demonstrated an understanding of the instructions.  A copy of instructions were sent to the patient via MyChart unless otherwise noted below.     The patient was advised to call back or seek an in-person evaluation if the symptoms worsen or if the condition fails to improve as anticipated.  Time:  I spent 10 minutes with the patient via telehealth technology discussing the above problems/concerns.    Tylene Fantasia Ward, PA-C

## 2022-06-23 NOTE — Telephone Encounter (Signed)
Patient called and stated she got covid again and stated Dr. Dellis Anes helped her get some antibiotics last time and wanted to know if she can get them again.

## 2022-06-23 NOTE — Telephone Encounter (Signed)
Before we treat her with anything, she needs a positive test.  So I would encourage her to take a testing call us back.  It looks like her creatinine is normal, so we can likely just do the Paxlovid, again only if the test is positive.  She can certainly use over-the-counter medications including oral decongestions like Sudafed and over-the-counter cold medications.  If she having any breathing problems?  She can certainly do albuterol 4-6 puffs every 4 hours.  Malachi Bonds, MD [facility]

## 2022-06-23 NOTE — Telephone Encounter (Signed)
Patient called to see if you seen her message yet.

## 2022-07-30 ENCOUNTER — Telehealth: Payer: Medicaid Other | Admitting: Physician Assistant

## 2022-07-30 DIAGNOSIS — Z7251 High risk heterosexual behavior: Secondary | ICD-10-CM

## 2022-07-30 NOTE — Progress Notes (Signed)
Patient requesting Plan B. Advised we are unable to prescribe through the virtual department at this time. Advised OTC but can try to see if her PCP, OB/GYN may prescribe. Can also be seen at in person UC, local county [facility] department, or [facility] for this. She voiced understanding. Marked no charge.

## 2022-08-13 ENCOUNTER — Other Ambulatory Visit: Payer: Self-pay | Admitting: Nurse Practitioner

## 2022-08-13 DIAGNOSIS — Z3041 Encounter for surveillance of contraceptive pills: Secondary | ICD-10-CM

## 2022-08-13 NOTE — Telephone Encounter (Signed)
Med refill request: [place] Last seen: 02/10/22 Next AEX: not scheduled Last MMG (if hormonal med) n/a Refill authorized: Please Advise, #[phone], 1 RF

## 2022-09-13 ENCOUNTER — Other Ambulatory Visit: Payer: Self-pay | Admitting: Allergy & Immunology

## 2022-09-15 ENCOUNTER — Other Ambulatory Visit: Payer: Self-pay | Admitting: Allergy & Immunology

## 2022-09-15 NOTE — Telephone Encounter (Signed)
Patient is requesting refill for omeprazole. Patient scheduled visit for 10-15-22. Patient advised to keep appointment for future refills. Advised patient if she needs to reschedule or cancel appointment no additional refills would be sent in after 30 day courtesy refill. Patient verbalized understanding.   [facility] - [address] [place] [address]

## 2022-09-16 ENCOUNTER — Other Ambulatory Visit: Payer: Self-pay | Admitting: Allergy & Immunology

## 2022-09-17 ENCOUNTER — Telehealth: Payer: Self-pay | Admitting: Allergy & Immunology

## 2022-09-17 ENCOUNTER — Other Ambulatory Visit: Payer: Self-pay | Admitting: Allergy & Immunology

## 2022-09-17 MED ORDER — OMEPRAZOLE 40 MG PO CPDR: DELAYED_RELEASE_CAPSULE | ORAL | 0 refills | Status: DC

## 2022-09-17 NOTE — Telephone Encounter (Signed)
Spoke with patient and informed her that this office has not sent omeprazole into the pharmacy since January 2024 and that prescription was discontinued by another office and pantoprazole was sent in to replace it. Patient stated that this information is incorrect and that this office sent in a refill in. Spoke to Dr. Dellis Anes and he gave verbal approval to send in previous dosage of medication.

## 2022-09-17 NOTE — Telephone Encounter (Signed)
Patient called stating she is needing a courtesy refill on Omeprazole. Patient states she has tried to get a refill 3 times on the same medication. Patient states she really needs that medication to eat everyday.

## 2022-09-17 NOTE — Telephone Encounter (Signed)
[name] is requesting Omeprazole to be approved for refills.  She made an appointment for the first available in Sept.  [name] states she is really having a hard time and would like the Omeprazole today please. Please send to [facility]

## 2022-10-12 ENCOUNTER — Other Ambulatory Visit: Payer: Self-pay | Admitting: Allergy & Immunology

## 2022-10-15 ENCOUNTER — Other Ambulatory Visit: Payer: Self-pay

## 2022-10-15 ENCOUNTER — Encounter: Payer: Self-pay | Admitting: Allergy & Immunology

## 2022-10-15 ENCOUNTER — Ambulatory Visit (INDEPENDENT_AMBULATORY_CARE_PROVIDER_SITE_OTHER): Payer: Medicaid Other | Admitting: Allergy & Immunology

## 2022-10-15 VITALS — BP 114/70 | HR 96 | Temp 97.8°F | Resp 18 | Ht 62.0 in | Wt 261.7 lb

## 2022-10-15 DIAGNOSIS — J3089 Other allergic rhinitis: Secondary | ICD-10-CM | POA: Diagnosis not present

## 2022-10-15 DIAGNOSIS — K219 Gastro-esophageal reflux disease without esophagitis: Secondary | ICD-10-CM

## 2022-10-15 DIAGNOSIS — R490 Dysphonia: Secondary | ICD-10-CM

## 2022-10-15 DIAGNOSIS — J4540 Moderate persistent asthma, uncomplicated: Secondary | ICD-10-CM | POA: Diagnosis not present

## 2022-10-15 DIAGNOSIS — B999 Unspecified infectious disease: Secondary | ICD-10-CM

## 2022-10-15 DIAGNOSIS — J302 Other seasonal allergic rhinitis: Secondary | ICD-10-CM

## 2022-10-15 DIAGNOSIS — J4541 Moderate persistent asthma with (acute) exacerbation: Secondary | ICD-10-CM

## 2022-10-15 MED ORDER — MONTELUKAST SODIUM 10 MG PO TABS: 10.0000 mg | ORAL_TABLET | Freq: Every day | ORAL | 1 refills | Status: DC

## 2022-10-15 MED ORDER — CETIRIZINE HCL 10 MG PO TABS: 10.0000 mg | ORAL_TABLET | Freq: Every day | ORAL | 1 refills | Status: DC

## 2022-10-15 MED ORDER — PREDNISONE 10 MG PO TABS: ORAL_TABLET | ORAL | 0 refills | Status: DC

## 2022-10-15 MED ORDER — BREZTRI AEROSPHERE 160-9-4.8 MCG/ACT IN AERO: 2.0000 | INHALATION_SPRAY | Freq: Two times a day (BID) | RESPIRATORY_TRACT | 1 refills | Status: DC

## 2022-10-15 MED ORDER — FAMOTIDINE 20 MG PO TABS: 20.0000 mg | ORAL_TABLET | Freq: Two times a day (BID) | ORAL | 1 refills | Status: DC

## 2022-10-15 MED ORDER — OMEPRAZOLE 40 MG PO CPDR: 40.0000 mg | DELAYED_RELEASE_CAPSULE | Freq: Two times a day (BID) | ORAL | 1 refills | Status: DC

## 2022-10-15 MED ORDER — ALBUTEROL SULFATE HFA 108 (90 BASE) MCG/ACT IN AERS: 2.0000 | INHALATION_SPRAY | Freq: Four times a day (QID) | RESPIRATORY_TRACT | 0 refills | Status: DC | PRN

## 2022-10-15 NOTE — Patient Instructions (Addendum)
1. Moderate persistent asthma, uncomplicated - Lung testing looked good today. - We are going to get Dupixent back on board. - Breztri sample provided today. - Prednisone burst started today.  - Daily controller medication(s): Singulair 10mg  daily and Breztri two puffs twice daily with spacer - Prior to physical activity: albuterol 2 puffs 10-15 minutes before physical activity. - Rescue medications: albuterol 4 puffs every 4-6 hours as needed - Asthma control goals:  * Full participation in all desired activities (may need albuterol before activity) * Albuterol use two time or less a week on average (not counting use with activity) * Cough interfering with sleep two time or less a month * Oral steroids no more than once a year * No hospitalizations  2. Seasonal and perennial allergic rhinitis (dust mites, cats, dogs, cockroach, trees, weeds, grass, mouse, and horse) - The Dupixent might help with this.  - Continue with: hydroxyzine one tablet twice daily, Singulair (montelukast) 10mg  daily, Flonase (fluticasone) two sprays per nostril daily - You can use an extra dose of the antihistamine, if needed, for breakthrough symptoms.  - Continue with nasal saline rinses as needed.    3. Eczema - Continue with the hydrocortisone as needed.   4. GERD  - Continue with omeprazole 40mg  BID. - Add on famotidine 20mg  twice daily as well.   4. Return in about 3 months (around 01/14/2023).    Please inform us of any Emergency Department visits, hospitalizations, or changes in symptoms. Call us before going to the ED for breathing or allergy symptoms since we might be able to fit you in for a sick visit. Feel free to contact us anytime with any questions, problems, or concerns.  It was a pleasure to see you again today!  Websites that have reliable patient information: 1. American Academy of Asthma, Allergy, and Immunology: www.aaaai.org 2. Food Allergy Research and Education (FARE):  foodallergy.org 3. Mothers of Asthmatics: http://www.asthmacommunitynetwork.org 4. American College of Allergy, Asthma, and Immunology: www.acaai.org   COVID-19 Vaccine Information can be found at: PodExchange.nl For questions related to vaccine distribution or appointments, please email vaccine@[email or web address] or call [phone].     "Like" [facility] on Facebook and Instagram for our latest updates!       Make sure you are registered to vote! If you have moved or changed any of your contact information, you will need to get this updated before voting!  In some cases, you MAY be able to register to vote online: [email or web address]

## 2022-10-15 NOTE — Progress Notes (Signed)
FOLLOW UP  Date of Service/Encounter:  10/15/22   Assessment:   Moderate persistent asthma without complication - complicated by socioeconomic instability  AEC 400 in September 2024    Seasonal and perennial allergic rhinitis (dust mites, cats, dogs, cockroach, trees, weeds, grass, mouse, and horse) - was doing better on allergy shots (but transportation has been an issue in the past)   Boderline personality disorder - with worsening status since breaking up with her significant other of 13 years   Recurrent infections - getting immune workup today  Plan/Recommendations:    1. Moderate persistent asthma, uncomplicated - Lung testing looked good today. - We are going to get Dupixent back on board. - Breztri sample provided today. - Prednisone burst started today.  - Daily controller medication(s): Singulair 10mg  daily and Breztri two puffs twice daily with spacer - Prior to physical activity: albuterol 2 puffs 10-15 minutes before physical activity. - Rescue medications: albuterol 4 puffs every 4-6 hours as needed - Asthma control goals:  * Full participation in all desired activities (may need albuterol before activity) * Albuterol use two time or less a week on average (not counting use with activity) * Cough interfering with sleep two time or less a month * Oral steroids no more than once a year * No hospitalizations  2. Seasonal and perennial allergic rhinitis (dust mites, cats, dogs, cockroach, trees, weeds, grass, mouse, and horse) - The Dupixent might help with this.  - Continue with: hydroxyzine one tablet twice daily, Singulair (montelukast) 10mg  daily, Flonase (fluticasone) two sprays per nostril daily - You can use an extra dose of the antihistamine, if needed, for breakthrough symptoms.  - Continue with nasal saline rinses as needed.    3. Eczema - Continue with the hydrocortisone as needed.   4. GERD  - Continue with omeprazole 40mg  BID. - Add on  famotidine 20mg  twice daily as well.   5. Return in about 3 months (around 01/14/2023).    Subjective:   [name] is a 37 y.o. female presenting today for follow up of  Chief Complaint  Patient presents with   Asthma    6 mth f/u - Not Good   Seasonal and Perennial Allergic Rhinitis    6 mth f/u - SoSo   Gastroesophageal Reflux    6 mth f/u - SoSo    [name] has a history of the following: Patient Active Problem List   Diagnosis Date Noted   Smoker 03/20/2022   HTN (hypertension) 03/20/2022   Vaginitis 03/20/2022   Suicide ideation 02/18/2022   Substance abuse (HCC) 02/18/2022   Bipolar 1 disorder, manic, full remission (HCC) 02/18/2022   Abscess of external cheek, left 01/01/2022   Rash 12/25/2021   Asthma exacerbation 11/26/2021   STD exposure 11/26/2021   Insect bite 11/26/2021   Bipolar affective disorder (HCC) 11/13/2021   Encounter for well adult exam with abnormal findings 11/12/2021   Cellulitis 11/12/2021   Vaginal discharge 08/26/2021   BRBPR (bright red blood per rectum) 02/27/2021   Anal fissure 02/27/2021   Facial abscess 02/12/2021   Folliculitis 12/21/2020   Bipolar 2 disorder, major depressive episode (HCC) 12/20/2020   Vitamin D deficiency 07/15/2020   B12 deficiency 07/15/2020   MDD (major depressive disorder), recurrent severe, without psychosis (HCC) 04/26/2020   PTSD (post-traumatic stress disorder) 01/03/2020   Allergic asthma 01/13/2018   Allergic rhinitis 03/12/2017   Class 2 obesity due to excess calories without serious comorbidity with body mass  index (BMI) of 37.0 to 37.9 in adult 04/17/2016   Acute asthmatic bronchitis 03/13/2016   Borderline personality disorder in adult [facility]) 03/13/2016   Mood disorder (HCC) 03/04/2013    History obtained from: chart review and patient.  [name] is a 37 y.o. female presenting for a follow up visit.  She was last seen in January 2024.  At that time, her lung testing looked good.  We  worked on getting the Dupixent approved for her asthma.  We continue with Singulair 10 mg daily and Symbicort 160 mcg 2 puffs twice daily as well as albuterol as needed.  For her allergic rhinitis, we continue with hydroxyzine as well as Singulair and Flonase.  Eczema was under good control with hydrocortisone as needed.  We did get an immune workup due to her history of recurrent sinusitis.  However, these were never collected.  Since the last visit, she has mostly done well. She is much more stable situation. She is renting a room and is more stable.  She was homeless for a period of time.  Asthma/Respiratory Symptom History: She is on the Trelegy 1 puff once daily.  However, there was trouble getting this covered.  She is open to trying something different if he will be covered better.  She has not been using her Dupixent.  She is interested in getting that back on board.  She has not been on prednisone nor she been to the emergency room or urgent care for her symptoms.  Allergic Rhinitis Symptom History: Allergic rhinitis is not well-controlled.  She did run out of her hydroxyzine and needs refills on the Singulair and Flonase.  Skin Symptom History: Eczema is controlled with hydrocortisone as needed.  This combination seems to be working fairly well.  GERD Symptom History: She remains on the omeprazole 40 mg twice daily.  She does still have some breakthrough reflux symptoms.  She has not tried using Pepcid.  Otherwise, there have been no changes to her past medical history, surgical history, family history, or social history.    Review of systems otherwise negative other than that mentioned in the HPI.    Objective:   Blood pressure 114/70, pulse 96, temperature 97.8 F (36.6 C), temperature source Temporal, resp. rate 18, height 5\' 2"  (1.575 m), weight 261 lb 11.2 oz (118.7 kg), SpO2 97%. Body mass index is 47.87 kg/m.    Physical Exam Vitals reviewed.  Constitutional:       Appearance: She is well-developed.  HENT:     Head: Normocephalic and atraumatic.     Right Ear: Tympanic membrane, ear canal and external ear normal. No drainage, swelling or tenderness. Tympanic membrane is not injected, scarred, erythematous, retracted or bulging.     Left Ear: Tympanic membrane, ear canal and external ear normal. No drainage, swelling or tenderness. Tympanic membrane is not injected, scarred, erythematous, retracted or bulging.     Nose: No nasal deformity, septal deviation, mucosal edema or rhinorrhea.     Right Turbinates: Enlarged, swollen and pale.     Left Turbinates: Enlarged, swollen and pale.     Right Sinus: No maxillary sinus tenderness or frontal sinus tenderness.     Left Sinus: No maxillary sinus tenderness or frontal sinus tenderness.     Mouth/Throat:     Lips: Pink.     Mouth: Mucous membranes are moist. Mucous membranes are not pale and not dry.     Pharynx: Uvula midline.  Eyes:     General:  Right eye: No discharge.        Left eye: No discharge.     Conjunctiva/sclera: Conjunctivae normal.     Right eye: Right conjunctiva is not injected. No chemosis.    Left eye: Left conjunctiva is not injected. No chemosis.    Pupils: Pupils are equal, round, and reactive to light.  Cardiovascular:     Rate and Rhythm: Normal rate and regular rhythm.     Heart sounds: Normal heart sounds.  Pulmonary:     Effort: Pulmonary effort is normal. No tachypnea, accessory muscle usage or respiratory distress.     Breath sounds: Normal breath sounds. No wheezing, rhonchi or rales.  Chest:     Chest wall: No tenderness.  Abdominal:     Tenderness: There is no abdominal tenderness. There is no guarding or rebound.  Lymphadenopathy:     Head:     Right side of head: No submandibular, tonsillar or occipital adenopathy.     Left side of head: No submandibular, tonsillar or occipital adenopathy.     Cervical: No cervical adenopathy.  Skin:    Coloration: Skin is  not pale.     Findings: No abrasion, erythema, petechiae or rash. Rash is not papular, urticarial or vesicular.  Neurological:     Mental Status: She is alert.  Psychiatric:        Behavior: Behavior is cooperative.      Diagnostic studies:    Spirometry: results normal (FEV1: 2.46/84%, FVC: 2.98/85%, FEV1/FVC: 83%).    Spirometry consistent with normal pattern.   Allergy Studies: none        Malachi Bonds, MD  [facility]

## 2022-10-16 ENCOUNTER — Other Ambulatory Visit (HOSPITAL_COMMUNITY): Payer: Self-pay

## 2022-10-16 ENCOUNTER — Telehealth: Payer: Self-pay

## 2022-10-16 NOTE — Telephone Encounter (Signed)
*  Asthma/Allergy  Pharmacy Patient Advocate Encounter   Received notification from CoverMyMeds that prior authorization for Breztri Aerosphere 160-9-4.8MCG/ACT aerosol  is required/requested.   Insurance verification completed.   The patient is insured through [facility] .   Per test claim: PA required; PA submitted to [facility] via CoverMyMeds Key/confirmation #/EOC [id number] Status is pending

## 2022-10-16 NOTE — Telephone Encounter (Signed)
Breztri pa denied insurance prefers Advair HFA, Advair Diskus, Dulera   advise to change in therapy

## 2022-10-16 NOTE — Telephone Encounter (Signed)
OK let's do Advair 115/21 mcg two puffs BID as well as Spiriva 1.25 mcg two puffs once daily.   Malachi Bonds, MD [facility]

## 2022-10-16 NOTE — Telephone Encounter (Signed)
Pharmacy Patient Advocate Encounter  Received notification from [facility] that Prior Authorization for [facility] Aerosphere 160-9-4.8MCG/ACT aerosol  has been DENIED.  See denial reason below. No denial letter attached in CMM. Will attache denial letter to Media tab once received.   PA #/Case ID/Reference #: Here are the policy requirements your request did not meet: Per your health plan's criteria, this drug is covered if you meet the following: One of the following: (i) You have failed one preferred drug as confirmed by claims history or submission of medical records: Advair HFA, Advair Diskus, Dulera. (ii) You cannot use one preferred drug (please specify contraindication or intolerance). The information provided does not show that you meet the criteria listed above. Please speak with your doctor about your choices. This decision was made per the [employer] [place] NonPreferred Drugs Guideline.

## 2022-10-19 ENCOUNTER — Telehealth: Payer: Self-pay | Admitting: Allergy & Immunology

## 2022-10-19 MED ORDER — FLUTICASONE-SALMETEROL 115-21 MCG/ACT IN AERO: 2.0000 | INHALATION_SPRAY | Freq: Two times a day (BID) | RESPIRATORY_TRACT | 5 refills | Status: DC

## 2022-10-19 MED ORDER — SPIRIVA RESPIMAT 1.25 MCG/ACT IN AERS: 2.0000 | INHALATION_SPRAY | Freq: Every day | RESPIRATORY_TRACT | 5 refills | Status: DC

## 2022-10-19 NOTE — Telephone Encounter (Addendum)
Medication has been sent to verified pharmacy. Patient has been notified of change and why it was changed.

## 2022-10-19 NOTE — Telephone Encounter (Signed)
Pharmacy called has a question about prescription fluticasone-salmeterol (ADVAIR HFA) 115-21 MCG/ACT inhaler [[id number]] please advise

## 2022-10-19 NOTE — Telephone Encounter (Signed)
Sent in refill verified pharmacy with pt [facility]

## 2022-10-19 NOTE — Addendum Note (Signed)
Addended by: Rolland Bimler D on: 10/19/2022 11:24 AM   Modules accepted: Orders

## 2022-10-20 ENCOUNTER — Encounter: Payer: Self-pay | Admitting: Allergy & Immunology

## 2022-10-20 ENCOUNTER — Telehealth: Payer: Self-pay | Admitting: *Deleted

## 2022-10-20 NOTE — Telephone Encounter (Signed)
Called patient to make sure she wants to move forward with Dupixent since she didn't follow through last time with contacting pharmacy. She advised she does and will obtain approval and send to [place]. Will reach out once delivery set so she can come in for loading dose and admin instructions and self admin at home after

## 2022-10-20 NOTE — Telephone Encounter (Signed)
-----   Message from Alfonse Spruce sent at 10/20/2022  2:09 PM EDT ----- Interested in Dupixent again

## 2022-10-21 ENCOUNTER — Other Ambulatory Visit: Payer: Self-pay

## 2022-10-21 ENCOUNTER — Other Ambulatory Visit (HOSPITAL_COMMUNITY): Payer: Self-pay

## 2022-10-21 MED ORDER — DUPIXENT 300 MG/2ML ~~LOC~~ SOSY
300.0000 mg | PREFILLED_SYRINGE | SUBCUTANEOUS | 11 refills | Status: DC
  Filled 2022-10-21: qty 4, 28d supply, fill #0
  Filled 2022-10-22: qty 4, 14d supply, fill #0
  Filled 2022-11-24: qty 4, 28d supply, fill #1
  Filled 2022-12-15 (×2): qty 4, 28d supply, fill #2
  Filled 2023-01-12 – 2023-01-21 (×3): qty 4, 28d supply, fill #3
  Filled 2023-02-10 (×2): qty 4, 28d supply, fill #4
  Filled 2023-03-15: qty 4, 28d supply, fill #5
  Filled 2023-06-25: qty 4, 28d supply, fill #6

## 2022-10-21 NOTE — Telephone Encounter (Signed)
Her social situation was tenuous.  She now has a place to live at least!

## 2022-10-22 ENCOUNTER — Telehealth: Payer: Self-pay | Admitting: *Deleted

## 2022-10-22 ENCOUNTER — Other Ambulatory Visit (HOSPITAL_COMMUNITY): Payer: Self-pay

## 2022-10-22 ENCOUNTER — Other Ambulatory Visit: Payer: Self-pay

## 2022-10-22 NOTE — Telephone Encounter (Signed)
L/m for patient to call clinic to schedule dupixent 600mg  start then she will admin at home- delivery 9/23 so after this date

## 2022-10-23 ENCOUNTER — Other Ambulatory Visit: Payer: Self-pay

## 2022-10-23 ENCOUNTER — Other Ambulatory Visit (HOSPITAL_COMMUNITY): Payer: Self-pay

## 2022-10-24 ENCOUNTER — Other Ambulatory Visit: Payer: Self-pay

## 2022-10-28 ENCOUNTER — Ambulatory Visit: Payer: Medicaid Other

## 2022-11-02 ENCOUNTER — Other Ambulatory Visit: Payer: Self-pay

## 2022-11-09 ENCOUNTER — Encounter: Payer: Self-pay | Admitting: Internal Medicine

## 2022-11-09 ENCOUNTER — Telehealth (INDEPENDENT_AMBULATORY_CARE_PROVIDER_SITE_OTHER): Payer: Medicaid Other | Admitting: Internal Medicine

## 2022-11-09 DIAGNOSIS — E538 Deficiency of other specified B group vitamins: Secondary | ICD-10-CM

## 2022-11-09 DIAGNOSIS — L02811 Cutaneous abscess of head [any part, except face]: Secondary | ICD-10-CM

## 2022-11-09 DIAGNOSIS — L02211 Cutaneous abscess of abdominal wall: Secondary | ICD-10-CM | POA: Diagnosis not present

## 2022-11-09 DIAGNOSIS — E559 Vitamin D deficiency, unspecified: Secondary | ICD-10-CM

## 2022-11-09 DIAGNOSIS — L02419 Cutaneous abscess of limb, unspecified: Secondary | ICD-10-CM | POA: Diagnosis not present

## 2022-11-09 DIAGNOSIS — L0291 Cutaneous abscess, unspecified: Secondary | ICD-10-CM | POA: Diagnosis not present

## 2022-11-09 NOTE — Assessment & Plan Note (Signed)
Lab Results  Component Value Date   VITAMINB12 216 11/12/2021   Low, to start oral replacement - b12 1000 mcg qd

## 2022-11-09 NOTE — Assessment & Plan Note (Signed)
With worsening sizes and pain, to continue doxy course, also refer general surgury

## 2022-11-09 NOTE — Patient Instructions (Signed)
Please continue all other medications as before, and refills have been done if requested.  Please have the pharmacy call with any other refills you may need.  Please continue your efforts at being more active, low cholesterol diet, and weight control.  Please keep your appointments with your specialists as you may have planned  You will be contacted regarding the referral for: general surgury

## 2022-11-09 NOTE — Assessment & Plan Note (Signed)
Last vitamin D Lab Results  Component Value Date   VD25OH 38.20 11/12/2021   Low, reminded to start oral replacement

## 2022-11-09 NOTE — Progress Notes (Signed)
[name], female   DOB: 1985/09/05, 37 y.o.   MRN: [id number]  Virtual Visit via Video Note  I connected with [name] on 11/09/22 at  3:00 PM EDT by a video enabled telemedicine application and verified that I am speaking with the correct person using two identifiers.  Location of all participants today Patient: at home Provider: at office   I discussed the limitations of evaluation and management by telemedicine and the availability of in person appointments. The patient expressed understanding and agreed to proceed.  History of Present Illness: Here with 10 days worsening complaints - Was seen initially at [facility] approx 1.5 wks ago with fungal rash under breasts and groin, and at time had very small abscess to right axilla and right flank area tx with topical mupirocin; then developed worsening scalp abscess occurred to area approx 2 cm behind the frontal hairline for which she went to [place] 1 wk ago, tx with Tdap and bactrim course, and contd mupirocin.  Then 4 days ago had sponteously drainage at 2 am, of the scalp abscess, bleeding would not stop so called EMS , seen again at ED with I&D scalp abscess and doxy course.  Still taking doxy but scalp abscess seems back to worsening again, as well as some worsening of the right axillary and right flank side area as well.  No high fever, chills.   Past Medical History:  Diagnosis Date   Allergy    Anxiety    Asthma    Bipolar 2 disorder (HCC)    Borderline personality disorder (HCC)    Depression    Eczema    Former smoker    Obesity    PTSD (post-traumatic stress disorder)    Urticaria    Past Surgical History:  Procedure Laterality Date   Thumb surgery Left     reports that she has been smoking cigarettes. She has a 9 pack-year smoking history. She has been exposed to tobacco smoke. She has never used smokeless tobacco. She reports current alcohol use of about 6.0 - 12.0 standard drinks of alcohol per  week. She reports current drug use. Frequency: 4.00 times per week. Drugs: Marijuana and "Crack" cocaine. family history includes Allergic rhinitis in her paternal grandmother; Alzheimer's disease in her maternal grandmother; Asthma in her paternal grandmother; Healthy in her father and mother; Skin cancer in her paternal grandmother. Allergies  Allergen Reactions   Benztropine Other (See Comments)    agitation     Other Other (See Comments)    Opiates- history of dependency    Lamictal [Lamotrigine] Rash   Current Outpatient Medications on File Prior to Visit  Medication Sig Dispense Refill   albuterol (VENTOLIN HFA) 108 (90 Base) MCG/ACT inhaler Inhale 2 puffs into the lungs every 6 (six) hours as needed for wheezing or shortness of breath. 8 g 0   amLODipine (NORVASC) 5 MG tablet Take 1 tablet (5 mg total) by mouth daily. 90 tablet 3   Budeson-Glycopyrrol-Formoterol (BREZTRI AEROSPHERE) 160-9-4.8 MCG/ACT AERO Inhale 2 puffs into the lungs in the morning and at bedtime. 32.1 g 1   calcium carbonate (OS-CAL - DOSED IN MG OF ELEMENTAL CALCIUM) 1250 (500 Ca) MG tablet Take 1 tablet (1,250 mg total) by mouth daily with breakfast. 30 tablet 0   cetirizine (ZYRTEC) 10 MG tablet Take 1 tablet (10 mg total) by mouth daily. 90 tablet 1   dupilumab (DUPIXENT) 300 MG/2ML prefilled syringe Inject 300 mg into the skin  every 14 (fourteen) days. 4 mL 11   famotidine (PEPCID) 20 MG tablet Take 1 tablet (20 mg total) by mouth 2 (two) times daily. 180 tablet 1   ferrous sulfate 325 (65 FE) MG tablet Take 1 tablet (325 mg total) by mouth daily. 30 tablet 0   FLUoxetine (PROZAC) 10 MG capsule Take 10 mg by mouth daily.     fluticasone-salmeterol (ADVAIR HFA) 115-21 MCG/ACT inhaler Inhale 2 puffs into the lungs 2 (two) times daily. 1 each 5   gabapentin (NEURONTIN) 300 MG capsule Take 300 mg by mouth 3 (three) times daily.     hydrOXYzine (ATARAX) 50 MG tablet Take 1 tablet (50 mg total) by mouth 2 (two) times  daily as needed for anxiety. 60 tablet 1   montelukast (SINGULAIR) 10 MG tablet Take 1 tablet (10 mg total) by mouth at bedtime. 90 tablet 1   mupirocin ointment (BACTROBAN) 2 % Apply 1 Application topically 2 (two) times daily. 22 g 0   nicotine (NICODERM CQ - DOSED IN MG/24 HOURS) 14 mg/24hr patch Place 1 patch (14 mg total) onto the skin daily. 28 patch 5   OLANZapine zydis (ZYPREXA) 5 MG disintegrating tablet Take 1 tablet (5 mg total) by mouth at bedtime. 30 tablet 0   omeprazole (PRILOSEC) 40 MG capsule Take 1 capsule (40 mg total) by mouth in the morning and at bedtime. Take 1 capsule (40 mg total) by mouth 2 (two) times daily. 180 capsule 1   OXcarbazepine (TRILEPTAL) 300 MG tablet Take 1 tablet (300 mg total) by mouth 2 (two) times daily. 60 tablet 1   paliperidone (INVEGA) 6 MG 24 hr tablet Take 1 tablet (6 mg total) by mouth daily. 30 tablet 1   PORTIA-28 0.15-30 MG-MCG tablet Take 1 tablet by mouth at bedtime. 84 tablet 1   prazosin (MINIPRESS) 1 MG capsule Take 1 capsule (1 mg total) by mouth as directed. TAKE ONE IN THE AM AND 3 AT NIGHT 120 capsule 0   predniSONE (DELTASONE) 10 MG tablet Take 3 tabs (30mg ) twice daily for 3 days, then 2 tabs (20mg ) twice daily for 3 days, then 1 tab (10mg ) twice daily for 3 days, then STOP. 36 tablet 0   Tiotropium Bromide Monohydrate (SPIRIVA RESPIMAT) 1.25 MCG/ACT AERS Inhale 2 puffs into the lungs daily. 4 g 5   traZODone (DESYREL) 50 MG tablet Take 0.5 tablets (25 mg total) by mouth at bedtime as needed for sleep. 15 tablet 1   No current facility-administered medications on file prior to visit.    Observations/Objective: Alert, NAD, appropriate mood and affect, resps normal, cn 2-12 intact, moves all 4s, no visible rash or swelling Lab Results  Component Value Date   WBC 9.2 02/17/2022   HGB 14.4 02/17/2022   HCT 44.8 02/17/2022   PLT 272 02/17/2022   GLUCOSE 93 02/21/2022   CHOL 218 (H) 02/19/2022   TRIG 84 02/19/2022   HDL 54  02/19/2022   LDLCALC 147 (H) 02/19/2022   ALT 30 02/17/2022   AST 26 02/17/2022   NA 139 02/21/2022   K 3.9 02/21/2022   CL 105 02/21/2022   CREATININE 0.76 02/21/2022   BUN 10 02/21/2022   CO2 26 02/21/2022   TSH 2.977 02/19/2022   HGBA1C 5.2 02/19/2022   Assessment and Plan: See notes  Follow Up Instructions: See notes   I discussed the assessment and treatment plan with the patient. The patient was provided an opportunity to ask questions and all were answered.  The patient agreed with the plan and demonstrated an understanding of the instructions.   The patient was advised to call back or seek an in-person evaluation if the symptoms worsen or if the condition fails to improve as anticipated.   Oliver Barre, MD

## 2022-11-11 ENCOUNTER — Telehealth (INDEPENDENT_AMBULATORY_CARE_PROVIDER_SITE_OTHER): Payer: Medicaid Other | Admitting: Internal Medicine

## 2022-11-11 ENCOUNTER — Telehealth: Payer: Self-pay | Admitting: *Deleted

## 2022-11-11 ENCOUNTER — Encounter: Payer: Self-pay | Admitting: Internal Medicine

## 2022-11-11 DIAGNOSIS — E559 Vitamin D deficiency, unspecified: Secondary | ICD-10-CM

## 2022-11-11 DIAGNOSIS — F17200 Nicotine dependence, unspecified, uncomplicated: Secondary | ICD-10-CM

## 2022-11-11 DIAGNOSIS — N760 Acute vaginitis: Secondary | ICD-10-CM

## 2022-11-11 DIAGNOSIS — F410 Panic disorder [episodic paroxysmal anxiety] without agoraphobia: Secondary | ICD-10-CM | POA: Diagnosis not present

## 2022-11-11 MED ORDER — FLUCONAZOLE 150 MG PO TABS: ORAL_TABLET | ORAL | 1 refills | Status: DC

## 2022-11-11 MED ORDER — CLONAZEPAM 0.5 MG PO TABS: 0.5000 mg | ORAL_TABLET | Freq: Every day | ORAL | 0 refills | Status: DC | PRN

## 2022-11-11 NOTE — Progress Notes (Signed)
[name], female   DOB: 18-Aug-1985, 37 y.o.   MRN: [id number]  Virtual Visit via Video Note  I connected with [name] on 11/14/22 at  3:00 PM EDT by a video enabled telemedicine application and verified that I am speaking with the correct person using two identifiers.  Location of all participants today Patient: at home Provider: at office   I discussed the limitations of evaluation and management by telemedicine and the availability of in person appointments. The patient expressed understanding and agreed to proceed.  History of Present Illness: Here to f/u, did have culture return from ED visit + for MRSA, and incidentally saw general surgury earlier today with I&D, placed on cleocin.  Pt having some vaginal d/c and asking for diflucan.  Denies worsening depressive symptoms, suicidal ideation, has ongoing anxiety, with increase recently with her infeciton and several panic; does not have psychiatry appt until about 6 wks.  Pt denies chest pain, increased sob or doe, wheezing, orthopnea, PND, increased LE swelling, palpitations, dizziness or syncope.   Pt denies polydipsia, polyuria, or new focal neuro s/s.   Still smoking, not ready to quit.     Past Medical History:  Diagnosis Date   Allergy    Anxiety    Asthma    Bipolar 2 disorder (HCC)    Borderline personality disorder (HCC)    Depression    Eczema    Former smoker    Obesity    PTSD (post-traumatic stress disorder)    Urticaria    Past Surgical History:  Procedure Laterality Date   Thumb surgery Left     reports that she has been smoking cigarettes. She has a 9 pack-year smoking history. She has been exposed to tobacco smoke. She has never used smokeless tobacco. She reports current alcohol use of about 6.0 - 12.0 standard drinks of alcohol per week. She reports current drug use. Frequency: 4.00 times per week. Drugs: Marijuana and "Crack" cocaine. family history includes Allergic rhinitis in her  paternal grandmother; Alzheimer's disease in her maternal grandmother; Asthma in her paternal grandmother; Healthy in her father and mother; Skin cancer in her paternal grandmother. Allergies  Allergen Reactions   Benztropine Other (See Comments)    agitation     Other Other (See Comments)    Opiates- history of dependency    Lamictal [Lamotrigine] Rash   Current Outpatient Medications on File Prior to Visit  Medication Sig Dispense Refill   albuterol (VENTOLIN HFA) 108 (90 Base) MCG/ACT inhaler Inhale 2 puffs into the lungs every 6 (six) hours as needed for wheezing or shortness of breath. 8 g 0   amLODipine (NORVASC) 5 MG tablet Take 1 tablet (5 mg total) by mouth daily. 90 tablet 3   calcium carbonate (OS-CAL - DOSED IN MG OF ELEMENTAL CALCIUM) 1250 (500 Ca) MG tablet Take 1 tablet (1,250 mg total) by mouth daily with breakfast. 30 tablet 0   cetirizine (ZYRTEC) 10 MG tablet Take 1 tablet (10 mg total) by mouth daily. 90 tablet 1   famotidine (PEPCID) 20 MG tablet Take 1 tablet (20 mg total) by mouth 2 (two) times daily. 180 tablet 1   ferrous sulfate 325 (65 FE) MG tablet Take 1 tablet (325 mg total) by mouth daily. 30 tablet 0   FLUoxetine (PROZAC) 10 MG capsule Take 10 mg by mouth daily.     fluticasone-salmeterol (ADVAIR HFA) 115-21 MCG/ACT inhaler Inhale 2 puffs into the lungs 2 (two) times daily. 1  each 5   gabapentin (NEURONTIN) 300 MG capsule Take 300 mg by mouth 3 (three) times daily.     hydrOXYzine (ATARAX) 50 MG tablet Take 1 tablet (50 mg total) by mouth 2 (two) times daily as needed for anxiety. 60 tablet 1   montelukast (SINGULAIR) 10 MG tablet Take 1 tablet (10 mg total) by mouth at bedtime. 90 tablet 1   mupirocin ointment (BACTROBAN) 2 % Apply 1 Application topically 2 (two) times daily. 22 g 0   nicotine (NICODERM CQ - DOSED IN MG/24 HOURS) 14 mg/24hr patch Place 1 patch (14 mg total) onto the skin daily. 28 patch 5   OLANZapine zydis (ZYPREXA) 5 MG disintegrating  tablet Take 1 tablet (5 mg total) by mouth at bedtime. 30 tablet 0   omeprazole (PRILOSEC) 40 MG capsule Take 1 capsule (40 mg total) by mouth in the morning and at bedtime. Take 1 capsule (40 mg total) by mouth 2 (two) times daily. 180 capsule 1   OXcarbazepine (TRILEPTAL) 300 MG tablet Take 1 tablet (300 mg total) by mouth 2 (two) times daily. 60 tablet 1   paliperidone (INVEGA) 6 MG 24 hr tablet Take 1 tablet (6 mg total) by mouth daily. 30 tablet 1   PORTIA-28 0.15-30 MG-MCG tablet Take 1 tablet by mouth at bedtime. 84 tablet 1   prazosin (MINIPRESS) 1 MG capsule Take 1 capsule (1 mg total) by mouth as directed. TAKE ONE IN THE AM AND 3 AT NIGHT 120 capsule 0   Tiotropium Bromide Monohydrate (SPIRIVA RESPIMAT) 1.25 MCG/ACT AERS Inhale 2 puffs into the lungs daily. 4 g 5   traZODone (DESYREL) 50 MG tablet Take 0.5 tablets (25 mg total) by mouth at bedtime as needed for sleep. 15 tablet 1   Budeson-Glycopyrrol-Formoterol (BREZTRI AEROSPHERE) 160-9-4.8 MCG/ACT AERO Inhale 2 puffs into the lungs in the morning and at bedtime. (Patient not taking: Reported on 11/11/2022) 32.1 g 1   dupilumab (DUPIXENT) 300 MG/2ML prefilled syringe Inject 300 mg into the skin every 14 (fourteen) days. (Patient not taking: Reported on 11/11/2022) 4 mL 11   No current facility-administered medications on file prior to visit.   Observations/Objective: Alert, NAD, appropriate mood and affect, resps normal, cn 2-12 intact, moves all 4s, no visible rash or swelling Lab Results  Component Value Date   WBC 9.2 02/17/2022   HGB 14.4 02/17/2022   HCT 44.8 02/17/2022   PLT 272 02/17/2022   GLUCOSE 93 02/21/2022   CHOL 218 (H) 02/19/2022   TRIG 84 02/19/2022   HDL 54 02/19/2022   LDLCALC 147 (H) 02/19/2022   ALT 30 02/17/2022   AST 26 02/17/2022   NA 139 02/21/2022   K 3.9 02/21/2022   CL 105 02/21/2022   CREATININE 0.76 02/21/2022   BUN 10 02/21/2022   CO2 26 02/21/2022   TSH 2.977 02/19/2022   HGBA1C 5.2  02/19/2022   Assessment and Plan: See notes  Follow Up Instructions: See notes   I discussed the assessment and treatment plan with the patient. The patient was provided an opportunity to ask questions and all were answered. The patient agreed with the plan and demonstrated an understanding of the instructions.   The patient was advised to call back or seek an in-person evaluation if the symptoms worsen or if the condition fails to improve as anticipated.   Oliver Barre, MD

## 2022-11-11 NOTE — Telephone Encounter (Signed)
Patient called today stating that she has an abscess in her scalp that just tested positive for MRSA. She is currently being put on a different antibiotic to treat it, she states that they may have to debride the wound. She is wondering if you recommend on starting Dupixent until she is better?

## 2022-11-12 NOTE — Telephone Encounter (Signed)
Pt informed and stated understanding

## 2022-11-12 NOTE — Telephone Encounter (Signed)
Dupixent is not going to change her recovery from the MRSA or prevent her from healing. I would just continue with it.   Malachi Bonds, MD [facility]

## 2022-11-14 ENCOUNTER — Encounter: Payer: Self-pay | Admitting: Internal Medicine

## 2022-11-14 DIAGNOSIS — F410 Panic disorder [episodic paroxysmal anxiety] without agoraphobia: Secondary | ICD-10-CM | POA: Insufficient documentation

## 2022-11-14 NOTE — Patient Instructions (Signed)
Please take all new medication as prescribed 

## 2022-11-14 NOTE — Assessment & Plan Note (Signed)
Also for limited klonopin bid prn,  to f/u any worsening symptoms or concerns

## 2022-11-14 NOTE — Assessment & Plan Note (Signed)
Last vitamin D Lab Results  Component Value Date   VD25OH 38.20 11/12/2021   Low, to start oral replacement

## 2022-11-14 NOTE — Assessment & Plan Note (Signed)
Mild to mod, for diflucan asd prn,,  to f/u any worsening symptoms or concerns

## 2022-11-14 NOTE — Assessment & Plan Note (Signed)
Pt counseld to quit, pt not ready ?

## 2022-11-16 ENCOUNTER — Encounter: Payer: Self-pay | Admitting: Internal Medicine

## 2022-11-16 ENCOUNTER — Ambulatory Visit (INDEPENDENT_AMBULATORY_CARE_PROVIDER_SITE_OTHER): Payer: Medicaid Other | Admitting: Internal Medicine

## 2022-11-16 ENCOUNTER — Ambulatory Visit: Payer: Medicaid Other

## 2022-11-16 ENCOUNTER — Other Ambulatory Visit (INDEPENDENT_AMBULATORY_CARE_PROVIDER_SITE_OTHER): Payer: Medicaid Other

## 2022-11-16 ENCOUNTER — Other Ambulatory Visit: Payer: Self-pay | Admitting: Internal Medicine

## 2022-11-16 VITALS — BP 108/74 | HR 84 | Temp 97.9°F | Ht 62.0 in | Wt 271.6 lb

## 2022-11-16 DIAGNOSIS — F17200 Nicotine dependence, unspecified, uncomplicated: Secondary | ICD-10-CM

## 2022-11-16 DIAGNOSIS — Z202 Contact with and (suspected) exposure to infections with a predominantly sexual mode of transmission: Secondary | ICD-10-CM

## 2022-11-16 DIAGNOSIS — I10 Essential (primary) hypertension: Secondary | ICD-10-CM | POA: Diagnosis not present

## 2022-11-16 DIAGNOSIS — E559 Vitamin D deficiency, unspecified: Secondary | ICD-10-CM

## 2022-11-16 DIAGNOSIS — E538 Deficiency of other specified B group vitamins: Secondary | ICD-10-CM

## 2022-11-16 DIAGNOSIS — J4531 Mild persistent asthma with (acute) exacerbation: Secondary | ICD-10-CM

## 2022-11-16 DIAGNOSIS — Z0000 Encounter for general adult medical examination without abnormal findings: Secondary | ICD-10-CM | POA: Diagnosis not present

## 2022-11-16 DIAGNOSIS — J4550 Severe persistent asthma, uncomplicated: Secondary | ICD-10-CM

## 2022-11-16 DIAGNOSIS — R739 Hyperglycemia, unspecified: Secondary | ICD-10-CM

## 2022-11-16 DIAGNOSIS — E611 Iron deficiency: Secondary | ICD-10-CM

## 2022-11-16 DIAGNOSIS — F410 Panic disorder [episodic paroxysmal anxiety] without agoraphobia: Secondary | ICD-10-CM | POA: Diagnosis not present

## 2022-11-16 DIAGNOSIS — Z0001 Encounter for general adult medical examination with abnormal findings: Secondary | ICD-10-CM

## 2022-11-16 LAB — CBC WITH DIFFERENTIAL/PLATELET
Basophils Absolute: 0.1 10*3/uL (ref 0.0–0.1)
Basophils Relative: 0.7 % (ref 0.0–3.0)
Eosinophils Absolute: 0.2 10*3/uL (ref 0.0–0.7)
Eosinophils Relative: 1.9 % (ref 0.0–5.0)
HCT: 43.2 % (ref 36.0–46.0)
Hemoglobin: 14 g/dL (ref 12.0–15.0)
Lymphocytes Relative: 19.4 % (ref 12.0–46.0)
Lymphs Abs: 2 10*3/uL (ref 0.7–4.0)
MCHC: 32.3 g/dL (ref 30.0–36.0)
MCV: 88.5 fL (ref 78.0–100.0)
Monocytes Absolute: 0.8 10*3/uL (ref 0.1–1.0)
Monocytes Relative: 7.5 % (ref 3.0–12.0)
Neutro Abs: 7.1 10*3/uL (ref 1.4–7.7)
Neutrophils Relative %: 70.5 % (ref 43.0–77.0)
Platelets: 370 10*3/uL (ref 150.0–400.0)
RBC: 4.88 Mil/uL (ref 3.87–5.11)
RDW: 12.6 % (ref 11.5–15.5)
WBC: 10.1 10*3/uL (ref 4.0–10.5)

## 2022-11-16 MED ORDER — KETOCONAZOLE 2 % EX CREA: 1.0000 | TOPICAL_CREAM | Freq: Every day | CUTANEOUS | 0 refills | Status: DC

## 2022-11-16 MED ORDER — DUPILUMAB 300 MG/2ML ~~LOC~~ SOSY
600.0000 mg | PREFILLED_SYRINGE | Freq: Once | SUBCUTANEOUS | Status: AC
Start: 2022-11-16 — End: 2022-11-16
  Administered 2022-11-16: 600 mg via SUBCUTANEOUS

## 2022-11-16 MED ORDER — CLONAZEPAM 1 MG PO TABS: 1.0000 mg | ORAL_TABLET | Freq: Two times a day (BID) | ORAL | 2 refills | Status: AC | PRN

## 2022-11-16 MED ORDER — FLUOXETINE HCL 20 MG PO CAPS: 20.0000 mg | ORAL_CAPSULE | Freq: Every day | ORAL | 3 refills | Status: AC

## 2022-11-16 NOTE — Assessment & Plan Note (Signed)
Age and sex appropriate education and counseling updated with regular exercise and diet Referrals for preventative services - none needed Immunizations addressed - none needed Smoking counseling  - pt counsled to quit, pt not ready Evidence for depression or other mood disorder - worsening anxiety Most recent labs reviewed. I have personally reviewed and have noted: 1) the patient's medical and social history 2) The patient's current medications and supplements 3) The patient's height, weight, and BMI have been recorded in the chart

## 2022-11-16 NOTE — Assessment & Plan Note (Signed)
Lab Results  Component Value Date   VITAMINB12 216 11/12/2021   Low, to start oral replacement - b12 1000 mcg qd

## 2022-11-16 NOTE — Assessment & Plan Note (Signed)
Pt counseled to quit, pt not ready

## 2022-11-16 NOTE — Progress Notes (Signed)
[name], female   DOB: 06-27-1985, 37 y.o.   MRN: [id number]         Chief Complaint:: wellness exam and std screening (Precaution measures. Patient not currently having any symptoms of an STD ), Cyst (Infected cyst on her head that she wants you to look at. Patient states that she sees a Careers adviser on Wednesday ), and Medication Problem (Discuss anxiety medication )  , chin rash, wheezing       HPI:  [name] is a 37 y.o. female here for wellness exam; declines flu shot and covid booster, o/w up to date; still smoking, not ready to quit.                          Also s/p I&D scalp abscess cyst; improved with cleocin, for f/u with surgury wed oct 16, then complete surgical excision cyst in nov.  Has ongoing anxiety and tendency to self harm, unable to see psychiatry for several months.  Asks for STD check with ongoing unprotected intercourse.  Also rash to chin recent without itching, maybe drooling at night.  Also with 3 days worsening mild sob wheezing without fever,  Pt denies chest pain, orthopnea, PND, increased LE swelling, palpitations, dizziness or syncope.  Pt denies polydipsia, polyuria, or new focal neuro s/s.    Pt denies fever, wt loss, night sweats, loss of appetite, or other constitutional symptoms    Wt Readings from Last 3 Encounters:  11/16/22 271 lb 9.6 oz (123.2 kg)  10/15/22 261 lb 11.2 oz (118.7 kg)  03/20/22 242 lb (109.8 kg)   BP Readings from Last 3 Encounters:  11/16/22 108/74  10/15/22 114/70  05/19/22 117/88   Immunization History  Administered Date(s) Administered   Influenza,inj,Quad PF,6+ Mos 10/15/2016, 10/26/2017, 10/23/2020, 11/12/2021, 02/21/2022   Influenza-Unspecified 10/04/2015   PFIZER(Purple Top)SARS-COV-2 Vaccination 04/27/2019, 05/22/2019   Pneumococcal Polysaccharide-23 10/15/2016, 07/10/2020   Tdap 04/17/2016   Health Maintenance Due  Topic Date Due   COVID-19 Vaccine (3 - 2023-24 season) 10/04/2022      Past  Medical History:  Diagnosis Date   Allergy    Anxiety    Asthma    Bipolar 2 disorder (HCC)    Borderline personality disorder (HCC)    Depression    Eczema    Former smoker    Obesity    PTSD (post-traumatic stress disorder)    Urticaria    Past Surgical History:  Procedure Laterality Date   Thumb surgery Left     reports that she has been smoking cigarettes. She has a 9 pack-year smoking history. She has been exposed to tobacco smoke. She has never used smokeless tobacco. She reports current alcohol use of about 6.0 - 12.0 standard drinks of alcohol per week. She reports current drug use. Frequency: 4.00 times per week. Drugs: Marijuana and "Crack" cocaine. family history includes Allergic rhinitis in her paternal grandmother; Alzheimer's disease in her maternal grandmother; Asthma in her paternal grandmother; Healthy in her father and mother; Skin cancer in her paternal grandmother. Allergies  Allergen Reactions   Benztropine Other (See Comments)    agitation     Betadine [Povidone Iodine] Swelling   Other Other (See Comments)    Opiates- history of dependency    Lamictal [Lamotrigine] Rash   Current Outpatient Medications on File Prior to Visit  Medication Sig Dispense Refill   albuterol (VENTOLIN HFA) 108 (90 Base) MCG/ACT inhaler Inhale 2 puffs into  the lungs every 6 (six) hours as needed for wheezing or shortness of breath. 8 g 0   amLODipine (NORVASC) 5 MG tablet Take 1 tablet (5 mg total) by mouth daily. 90 tablet 3   calcium carbonate (OS-CAL - DOSED IN MG OF ELEMENTAL CALCIUM) 1250 (500 Ca) MG tablet Take 1 tablet (1,250 mg total) by mouth daily with breakfast. 30 tablet 0   cetirizine (ZYRTEC) 10 MG tablet Take 1 tablet (10 mg total) by mouth daily. 90 tablet 1   clindamycin (CLEOCIN) 300 MG capsule Take 300 mg by mouth 4 (four) times daily.     dupilumab (DUPIXENT) 300 MG/2ML prefilled syringe Inject 300 mg into the skin every 14 (fourteen) days. 4 mL 11    famotidine (PEPCID) 20 MG tablet Take 1 tablet (20 mg total) by mouth 2 (two) times daily. 180 tablet 1   ferrous sulfate 325 (65 FE) MG tablet Take 1 tablet (325 mg total) by mouth daily. 30 tablet 0   fluconazole (DIFLUCAN) 150 MG tablet 1 by mouth every 3 days as needed 3 tablet 1   fluticasone-salmeterol (ADVAIR HFA) 115-21 MCG/ACT inhaler Inhale 2 puffs into the lungs 2 (two) times daily. 1 each 5   gabapentin (NEURONTIN) 300 MG capsule Take 300 mg by mouth 3 (three) times daily.     hydrOXYzine (ATARAX) 50 MG tablet Take 1 tablet (50 mg total) by mouth 2 (two) times daily as needed for anxiety. 60 tablet 1   montelukast (SINGULAIR) 10 MG tablet Take 1 tablet (10 mg total) by mouth at bedtime. 90 tablet 1   mupirocin ointment (BACTROBAN) 2 % Apply 1 Application topically 2 (two) times daily. 22 g 0   nicotine (NICODERM CQ - DOSED IN MG/24 HOURS) 14 mg/24hr patch Place 1 patch (14 mg total) onto the skin daily. 28 patch 5   OLANZapine zydis (ZYPREXA) 5 MG disintegrating tablet Take 1 tablet (5 mg total) by mouth at bedtime. 30 tablet 0   omeprazole (PRILOSEC) 40 MG capsule Take 1 capsule (40 mg total) by mouth in the morning and at bedtime. Take 1 capsule (40 mg total) by mouth 2 (two) times daily. 180 capsule 1   OXcarbazepine (TRILEPTAL) 300 MG tablet Take 1 tablet (300 mg total) by mouth 2 (two) times daily. 60 tablet 1   paliperidone (INVEGA) 6 MG 24 hr tablet Take 1 tablet (6 mg total) by mouth daily. 30 tablet 1   PORTIA-28 0.15-30 MG-MCG tablet Take 1 tablet by mouth at bedtime. 84 tablet 1   prazosin (MINIPRESS) 1 MG capsule Take 1 capsule (1 mg total) by mouth as directed. TAKE ONE IN THE AM AND 3 AT NIGHT 120 capsule 0   Tiotropium Bromide Monohydrate (SPIRIVA RESPIMAT) 1.25 MCG/ACT AERS Inhale 2 puffs into the lungs daily. 4 g 5   traZODone (DESYREL) 50 MG tablet Take 0.5 tablets (25 mg total) by mouth at bedtime as needed for sleep. 15 tablet 1   No current facility-administered  medications on file prior to visit.        ROS:  All others reviewed and negative.  Objective        PE:  BP 108/74 (BP Location: Left Arm, Patient Position: Sitting, Cuff Size: Large)   Pulse 84   Temp 97.9 F (36.6 C) (Oral)   Ht 5\' 2"  (1.575 m)   Wt 271 lb 9.6 oz (123.2 kg)   LMP 11/08/2022   SpO2 96%   BMI 49.68 kg/m  Constitutional: Pt appears in NAD               HENT: Head: NCAT.                Right Ear: External ear normal.                 Left Ear: External ear normal.                Eyes: . Pupils are equal, round, and reactive to light. Conjunctivae and EOM are normal               Nose: without d/c or deformity               Neck: Neck supple. Gross normal ROM               Cardiovascular: Normal rate and regular rhythm.                 Pulmonary/Chest: Effort normal and breath sounds without rales with mild bilateral wheezing.                Abd:  Soft, NT, ND, + BS, no organomegaly               Neurological: Pt is alert. At baseline orientation, motor grossly intact               Skin: Skin is warm. LE edema - none; scalp cyst improved with mild swelling tender only, non fluctuant ; non tender erythema chin,                Psychiatric: Pt behavior is normal without agitation   Micro: none  Cardiac tracings I have personally interpreted today:  none  Pertinent Radiological findings (summarize): none   Lab Results  Component Value Date   WBC 9.2 02/17/2022   HGB 14.4 02/17/2022   HCT 44.8 02/17/2022   PLT 272 02/17/2022   GLUCOSE 93 02/21/2022   CHOL 218 (H) 02/19/2022   TRIG 84 02/19/2022   HDL 54 02/19/2022   LDLCALC 147 (H) 02/19/2022   ALT 30 02/17/2022   AST 26 02/17/2022   NA 139 02/21/2022   K 3.9 02/21/2022   CL 105 02/21/2022   CREATININE 0.76 02/21/2022   BUN 10 02/21/2022   CO2 26 02/21/2022   TSH 2.977 02/19/2022   HGBA1C 5.2 02/19/2022   Assessment/Plan:  [name] is a 37 y.o. White or Caucasian [1] female  with  has a past medical history of Allergy, Anxiety, Asthma, Bipolar 2 disorder (HCC), Borderline personality disorder (HCC), Depression, Eczema, Former smoker, Obesity, PTSD (post-traumatic stress disorder), and Urticaria.  Encounter for well adult exam with abnormal findings Age and sex appropriate education and counseling updated with regular exercise and diet Referrals for preventative services - none needed Immunizations addressed - none needed Smoking counseling  - pt counsled to quit, pt not ready Evidence for depression or other mood disorder - worsening anxiety Most recent labs reviewed. I have personally reviewed and have noted: 1) the patient's medical and social history 2) The patient's current medications and supplements 3) The patient's height, weight, and BMI have been recorded in the chart   Vitamin D deficiency Last vitamin D Lab Results  Component Value Date   VD25OH 38.20 11/12/2021   Low, to start oral replacement   B12 deficiency Lab Results  Component Value Date   VITAMINB12 216 11/12/2021   Low, to start oral replacement -  b12 1000 mcg qd   Asthma exacerbation Mild, cont inhaler prn, for prednison taper asd  STD exposure Also for testing as requested  Smoker Pt counseled to quit, pt not ready  HTN (hypertension) BP Readings from Last 3 Encounters:  11/16/22 108/74  10/15/22 114/70  05/19/22 117/88   Stable, pt to continue medical treatment norvasc 5 qd   Panic Uncontrolled, for increased prozac 20 every day and klonopin 1 mg bid prn, f/u psychiaty as planned  Followup: Return in about 6 months (around 05/17/2023).  Oliver Barre, MD 11/16/2022 8:36 PM [facility]

## 2022-11-16 NOTE — Patient Instructions (Signed)
Please take all new medication as prescribed - the cream for the chin  Ok to increase the klonopin to 1 mg twice per day as needed, and the prozac to 20 mg per day  Please continue all other medications as before, and refills have been done if requested.  Please have the pharmacy call with any other refills you may need.  Please continue your efforts at being more active, low cholesterol diet, and weight control.  You are otherwise up to date with prevention measures today.  Please keep your appointments with your specialists as you may have planned- surgury on Wednesday  Please go to the LAB at the blood drawing area for the tests to be done - at the [facility] lab at [address] [facility] (in the basement)  You will be contacted by phone if any changes need to be made immediately.  Otherwise, you will receive a letter about your results with an explanation, but please check with MyChart first.  Please make an Appointment to return in 6 months, or sooner if needed

## 2022-11-16 NOTE — Assessment & Plan Note (Signed)
Also for testing as requested

## 2022-11-16 NOTE — Assessment & Plan Note (Signed)
Last vitamin D Lab Results  Component Value Date   VD25OH 38.20 11/12/2021   Low, to start oral replacement

## 2022-11-16 NOTE — Assessment & Plan Note (Signed)
BP Readings from Last 3 Encounters:  11/16/22 108/74  10/15/22 114/70  05/19/22 117/88   Stable, pt to continue medical treatment norvasc 5 qd

## 2022-11-16 NOTE — Assessment & Plan Note (Signed)
Mild, cont inhaler prn, for prednison taper asd

## 2022-11-16 NOTE — Assessment & Plan Note (Signed)
Uncontrolled, for increased prozac 20 every day and klonopin 1 mg bid prn, f/u psychiaty as planned

## 2022-11-17 ENCOUNTER — Other Ambulatory Visit: Payer: Self-pay

## 2022-11-17 LAB — URINALYSIS, ROUTINE W REFLEX MICROSCOPIC
Bilirubin Urine: NEGATIVE
Hgb urine dipstick: NEGATIVE
Ketones, ur: NEGATIVE
Leukocytes,Ua: NEGATIVE
Nitrite: NEGATIVE
RBC / HPF: NONE SEEN (ref 0–?)
Specific Gravity, Urine: 1.01 (ref 1.000–1.030)
Total Protein, Urine: NEGATIVE
Urine Glucose: NEGATIVE
Urobilinogen, UA: 0.2 (ref 0.0–1.0)
WBC, UA: NONE SEEN (ref 0–?)
pH: 6 (ref 5.0–8.0)

## 2022-11-17 LAB — FERRITIN: Ferritin: 45.1 ng/mL (ref 10.0–291.0)

## 2022-11-17 LAB — BASIC METABOLIC PANEL
BUN: 10 mg/dL (ref 6–23)
CO2: 25 meq/L (ref 19–32)
Calcium: 9.1 mg/dL (ref 8.4–10.5)
Chloride: 101 meq/L (ref 96–112)
Creatinine, Ser: 0.83 mg/dL (ref 0.40–1.20)
GFR: 91.6 mL/min (ref >60.00)
Glucose, Bld: 100 mg/dL — ABNORMAL HIGH (ref 70–99)
Potassium: 3.9 meq/L (ref 3.5–5.1)
Sodium: 134 meq/L — ABNORMAL LOW (ref 135–145)

## 2022-11-17 LAB — LIPID PANEL
Cholesterol: 178 mg/dL (ref 0–200)
HDL: 57.8 mg/dL (ref >39.00)
LDL Cholesterol: 88 mg/dL (ref 0–99)
NonHDL: 120.34
Total CHOL/HDL Ratio: 3
Triglycerides: 163 mg/dL — ABNORMAL HIGH (ref 0.0–149.0)
VLDL: 32.6 mg/dL (ref 0.0–40.0)

## 2022-11-17 LAB — TSH: TSH: 0.99 u[IU]/mL (ref 0.35–5.50)

## 2022-11-17 LAB — HEMOGLOBIN A1C: Hgb A1c MFr Bld: 5.9 % (ref 4.6–6.5)

## 2022-11-17 LAB — HEPATIC FUNCTION PANEL
ALT: 15 U/L (ref 0–35)
AST: 19 U/L (ref 0–37)
Albumin: 4.1 g/dL (ref 3.5–5.2)
Alkaline Phosphatase: 87 U/L (ref 39–117)
Bilirubin, Direct: 0 mg/dL (ref 0.0–0.3)
Total Bilirubin: 0.3 mg/dL (ref 0.2–1.2)
Total Protein: 6.8 g/dL (ref 6.0–8.3)

## 2022-11-17 LAB — VITAMIN D 25 HYDROXY (VIT D DEFICIENCY, FRACTURES): VITD: 40.84 ng/mL (ref 30.00–100.00)

## 2022-11-17 LAB — IBC PANEL
Iron: 111 ug/dL (ref 42–145)
Saturation Ratios: 29 % (ref 20.0–50.0)
TIBC: 382.2 ug/dL (ref 250.0–450.0)
Transferrin: 273 mg/dL (ref 212.0–360.0)

## 2022-11-17 LAB — MICROALBUMIN / CREATININE URINE RATIO
Creatinine,U: 71.9 mg/dL
Microalb Creat Ratio: 1 mg/g (ref 0.0–30.0)
Microalb, Ur: <0.7 mg/dL (ref 0.0–1.9)

## 2022-11-17 LAB — VITAMIN B12: Vitamin B-12: 471 pg/mL (ref 211–911)

## 2022-11-17 NOTE — Progress Notes (Signed)
The test results show that your current treatment is OK, as the tests are stable.  Please continue the same plan.  There is no other need for change of treatment or further evaluation based on these results, at this time.  thanks 

## 2022-11-18 ENCOUNTER — Other Ambulatory Visit: Payer: Self-pay

## 2022-11-18 ENCOUNTER — Encounter: Payer: Self-pay | Admitting: Internal Medicine

## 2022-11-18 ENCOUNTER — Telehealth: Payer: Medicaid Other | Admitting: Internal Medicine

## 2022-11-18 DIAGNOSIS — J45909 Unspecified asthma, uncomplicated: Secondary | ICD-10-CM

## 2022-11-18 LAB — HSV 2 ANTIBODY, IGG: HSV 2 Glycoprotein G Ab, IgG: <0.9 {index}

## 2022-11-18 LAB — RPR: RPR Ser Ql: NONREACTIVE

## 2022-11-18 LAB — HIV ANTIBODY (ROUTINE TESTING W REFLEX): HIV 1&2 Ab, 4th Generation: NONREACTIVE

## 2022-11-18 LAB — GC/CHLAMYDIA PROBE AMP
Chlamydia trachomatis, NAA: NEGATIVE
Neisseria Gonorrhoeae by PCR: NEGATIVE

## 2022-11-18 MED ORDER — PREDNISONE 10 MG PO TABS: ORAL_TABLET | ORAL | 0 refills | Status: DC

## 2022-11-18 NOTE — Patient Instructions (Signed)
Please take all new medication as prescribed 

## 2022-11-18 NOTE — Progress Notes (Signed)
[name], female   DOB: 12-Sep-1985, 37 y.o.   MRN: [id number]  Pt needs prednisone - done erx for wheezing at last visit

## 2022-11-23 ENCOUNTER — Other Ambulatory Visit: Payer: Self-pay

## 2022-11-24 ENCOUNTER — Other Ambulatory Visit (HOSPITAL_COMMUNITY): Payer: Self-pay

## 2022-11-24 ENCOUNTER — Other Ambulatory Visit (HOSPITAL_COMMUNITY): Payer: Self-pay | Admitting: Pharmacy Technician

## 2022-11-24 NOTE — Progress Notes (Signed)
Specialty Pharmacy Refill Coordination Note  [name] is a 37 y.o. female contacted today regarding refills of specialty medication(s) Dupilumab   Patient requested Courier to Provider Office   Delivery date: 11/26/22   Verified address: [facility]  [address]   Medication will be filled on 11/25/22.

## 2022-11-30 ENCOUNTER — Ambulatory Visit: Payer: Medicaid Other | Admitting: *Deleted

## 2022-11-30 DIAGNOSIS — J4550 Severe persistent asthma, uncomplicated: Secondary | ICD-10-CM

## 2022-11-30 MED ORDER — DUPILUMAB 300 MG/2ML ~~LOC~~ SOSY
300.0000 mg | PREFILLED_SYRINGE | Freq: Once | SUBCUTANEOUS | Status: AC
Start: 2022-11-30 — End: 2022-11-30
  Administered 2022-11-30: 300 mg via SUBCUTANEOUS

## 2022-12-02 ENCOUNTER — Telehealth: Payer: Medicaid Other | Admitting: Family Medicine

## 2022-12-02 ENCOUNTER — Telehealth: Payer: Medicaid Other | Admitting: Physician Assistant

## 2022-12-02 ENCOUNTER — Encounter: Payer: Self-pay | Admitting: Family Medicine

## 2022-12-02 DIAGNOSIS — J208 Acute bronchitis due to other specified organisms: Secondary | ICD-10-CM | POA: Diagnosis not present

## 2022-12-02 DIAGNOSIS — Z91199 Patient's noncompliance with other medical treatment and regimen due to unspecified reason: Secondary | ICD-10-CM

## 2022-12-02 DIAGNOSIS — B9689 Other specified bacterial agents as the cause of diseases classified elsewhere: Secondary | ICD-10-CM

## 2022-12-02 MED ORDER — DOXYCYCLINE HYCLATE 100 MG PO TABS
100.0000 mg | ORAL_TABLET | Freq: Two times a day (BID) | ORAL | 0 refills | Status: DC
Start: 2022-12-02 — End: 2022-12-21

## 2022-12-02 MED ORDER — GUAIFENESIN ER 1200 MG PO TB12: 1.0000 | ORAL_TABLET | Freq: Two times a day (BID) | ORAL | 0 refills | Status: DC | PRN

## 2022-12-02 MED ORDER — BENZONATATE 100 MG PO CAPS
100.0000 mg | ORAL_CAPSULE | Freq: Three times a day (TID) | ORAL | 0 refills | Status: DC | PRN
Start: 2022-12-02 — End: 2022-12-02

## 2022-12-02 MED ORDER — BENZONATATE 100 MG PO CAPS: 100.0000 mg | ORAL_CAPSULE | Freq: Three times a day (TID) | ORAL | 0 refills | Status: DC | PRN

## 2022-12-02 NOTE — Patient Instructions (Addendum)
[name], thank you for joining Piedad Climes, PA-C for today's virtual visit.  While this provider is not your primary care provider (PCP), if your PCP is located in our provider database this encounter information will be shared with them immediately following your visit.   A [facility] MyChart account gives you access to today's visit and all your visits, tests, and labs performed at [facility] " click here if you don't have a [facility] MyChart account or go to [email or web address]  Consent: (Patient) [name] provided verbal consent for this virtual visit at the beginning of the encounter.  Current Medications:  Current Outpatient Medications:    albuterol (VENTOLIN HFA) 108 (90 Base) MCG/ACT inhaler, Inhale 2 puffs into the lungs every 6 (six) hours as needed for wheezing or shortness of breath., Disp: 8 g, Rfl: 0   amLODipine (NORVASC) 5 MG tablet, Take 1 tablet (5 mg total) by mouth daily., Disp: 90 tablet, Rfl: 3   calcium carbonate (OS-CAL - DOSED IN MG OF ELEMENTAL CALCIUM) 1250 (500 Ca) MG tablet, Take 1 tablet (1,250 mg total) by mouth daily with breakfast., Disp: 30 tablet, Rfl: 0   cetirizine (ZYRTEC) 10 MG tablet, Take 1 tablet (10 mg total) by mouth daily., Disp: 90 tablet, Rfl: 1   clonazePAM (KLONOPIN) 1 MG tablet, Take 1 tablet (1 mg total) by mouth 2 (two) times daily as needed for anxiety., Disp: 60 tablet, Rfl: 2   dupilumab (DUPIXENT) 300 MG/2ML prefilled syringe, Inject 300 mg into the skin every 14 (fourteen) days., Disp: 4 mL, Rfl: 11   famotidine (PEPCID) 20 MG tablet, Take 1 tablet (20 mg total) by mouth 2 (two) times daily., Disp: 180 tablet, Rfl: 1   ferrous sulfate 325 (65 FE) MG tablet, Take 1 tablet (325 mg total) by mouth daily., Disp: 30 tablet, Rfl: 0   fluconazole (DIFLUCAN) 150 MG tablet, 1 by mouth every 3 days as needed, Disp: 3 tablet, Rfl: 1   FLUoxetine (PROZAC) 20 MG capsule, Take 1 capsule (20 mg total) by  mouth daily., Disp: 90 capsule, Rfl: 3   fluticasone-salmeterol (ADVAIR HFA) 115-21 MCG/ACT inhaler, Inhale 2 puffs into the lungs 2 (two) times daily., Disp: 1 each, Rfl: 5   gabapentin (NEURONTIN) 300 MG capsule, Take 300 mg by mouth 3 (three) times daily., Disp: , Rfl:    hydrOXYzine (ATARAX) 50 MG tablet, Take 1 tablet (50 mg total) by mouth 2 (two) times daily as needed for anxiety., Disp: 60 tablet, Rfl: 1   ketoconazole (NIZORAL) 2 % cream, Apply 1 Application topically daily., Disp: 30 g, Rfl: 0   montelukast (SINGULAIR) 10 MG tablet, Take 1 tablet (10 mg total) by mouth at bedtime., Disp: 90 tablet, Rfl: 1   mupirocin ointment (BACTROBAN) 2 %, Apply 1 Application topically 2 (two) times daily., Disp: 22 g, Rfl: 0   nicotine (NICODERM CQ - DOSED IN MG/24 HOURS) 14 mg/24hr patch, Place 1 patch (14 mg total) onto the skin daily., Disp: 28 patch, Rfl: 5   OLANZapine zydis (ZYPREXA) 5 MG disintegrating tablet, Take 1 tablet (5 mg total) by mouth at bedtime., Disp: 30 tablet, Rfl: 0   omeprazole (PRILOSEC) 40 MG capsule, Take 1 capsule (40 mg total) by mouth in the morning and at bedtime. Take 1 capsule (40 mg total) by mouth 2 (two) times daily., Disp: 180 capsule, Rfl: 1   OXcarbazepine (TRILEPTAL) 300 MG tablet, Take 1 tablet (300 mg total) by mouth 2 (two) times  daily., Disp: 60 tablet, Rfl: 1   paliperidone (INVEGA) 6 MG 24 hr tablet, Take 1 tablet (6 mg total) by mouth daily., Disp: 30 tablet, Rfl: 1   PORTIA-28 0.15-30 MG-MCG tablet, Take 1 tablet by mouth at bedtime., Disp: 84 tablet, Rfl: 1   prazosin (MINIPRESS) 1 MG capsule, Take 1 capsule (1 mg total) by mouth as directed. TAKE ONE IN THE AM AND 3 AT NIGHT, Disp: 120 capsule, Rfl: 0   predniSONE (DELTASONE) 10 MG tablet, 3 tabs by mouth per day for 3 days,2tabs per day for 3 days,1tab per day for 3 days, Disp: 18 tablet, Rfl: 0   Tiotropium Bromide Monohydrate (SPIRIVA RESPIMAT) 1.25 MCG/ACT AERS, Inhale 2 puffs into the lungs daily.,  Disp: 4 g, Rfl: 5   traZODone (DESYREL) 50 MG tablet, Take 0.5 tablets (25 mg total) by mouth at bedtime as needed for sleep., Disp: 15 tablet, Rfl: 1   Medications ordered in this encounter:  No orders of the defined types were placed in this encounter.    *If you need refills on other medications prior to your next appointment, please contact your pharmacy*  Follow-Up: Call back or seek an in-person evaluation if the symptoms worsen or if the condition fails to improve as anticipated.  [facility] [phone]  Other Instructions Take antibiotic (Doxycycline) as directed.  Increase fluids.  Get plenty of rest. Use Mucinex for congestion. Continue your inhaled medications as directed. Take a daily probiotic (I recommend Align or Culturelle, but even Activia Yogurt may be beneficial).  A humidifier placed in the bedroom may offer some relief for a dry, scratchy throat of nasal irritation.  Read information below on acute bronchitis. Please call or return to clinic if symptoms are not improving.  Acute Bronchitis Bronchitis is when the airways that extend from the windpipe into the lungs get red, puffy, and painful (inflamed). Bronchitis often causes thick spit (mucus) to develop. This leads to a cough. A cough is the most common symptom of bronchitis. In acute bronchitis, the condition usually begins suddenly and goes away over time (usually in 2 weeks). Smoking, allergies, and asthma can make bronchitis worse. Repeated episodes of bronchitis may cause more lung problems.  HOME CARE Rest. Drink enough fluids to keep your pee (urine) clear or pale yellow (unless you need to limit fluids as told by your doctor). Only take over-the-counter or prescription medicines as told by your doctor. Avoid smoking and secondhand smoke. These can make bronchitis worse. If you are a smoker, think about using nicotine gum or skin patches. Quitting smoking will help your lungs heal  faster. Reduce the chance of getting bronchitis again by: Washing your hands often. Avoiding people with cold symptoms. Trying not to touch your hands to your mouth, nose, or eyes. Follow up with your doctor as told.  GET HELP IF: Your symptoms do not improve after 1 week of treatment. Symptoms include: Cough. Fever. Coughing up thick spit. Body aches. Chest congestion. Chills. Shortness of breath. Sore throat.  GET HELP RIGHT AWAY IF:  You have an increased fever. You have chills. You have severe shortness of breath. You have bloody thick spit (sputum). You throw up (vomit) often. You lose too much body fluid (dehydration). You have a severe headache. You faint.  MAKE SURE YOU:  Understand these instructions. Will watch your condition. Will get help right away if you are not doing well or get worse. Document Released: 07/08/2007 Document Revised: 09/21/2012 Document Reviewed:  07/12/2012 ExitCare Patient Information 2015 [place], [place]. This information is not intended to replace advice given to you by your health care provider. Make sure you discuss any questions you have with your health care provider.    If you have been instructed to have an in-person evaluation today at a local Urgent Care facility, please use the link below. It will take you to a list of all of our available [facility], including address, phone number and hours of operation. Please do not delay care.  [facility]  If you or a family member do not have a primary care provider, use the link below to schedule a visit and establish care. When you choose a [facility] primary care physician or advanced practice provider, you gain a long-term partner in health. Find a Primary Care Provider  Learn more about [facility]'s in-office and virtual care options: [facility] - Get Care Now

## 2022-12-02 NOTE — Progress Notes (Signed)
The arrived early but never returned to the visit. Patient no-showed for appointment despite this provider sending direct link, reaching out via phone with no response and waiting for at least 10 minutes from appointment time for patient to join. They will be marked as a NS for this appointment/time.   Freddy Finner, NP

## 2022-12-02 NOTE — Progress Notes (Signed)
Virtual Visit Consent   [name], you are scheduled for a virtual visit with a [facility] provider today. Just as with appointments in the office, your consent must be obtained to participate. Your consent will be active for this visit and any virtual visit you may have with one of our providers in the next 365 days. If you have a MyChart account, a copy of this consent can be sent to you electronically.  As this is a virtual visit, video technology does not allow for your provider to perform a traditional examination. This may limit your provider's ability to fully assess your condition. If your provider identifies any concerns that need to be evaluated in person or the need to arrange testing (such as labs, EKG, etc.), we will make arrangements to do so. Although advances in technology are sophisticated, we cannot ensure that it will always work on either your end or our end. If the connection with a video visit is poor, the visit may have to be switched to a telephone visit. With either a video or telephone visit, we are not always able to ensure that we have a secure connection.  By engaging in this virtual visit, you consent to the provision of healthcare and authorize for your insurance to be billed (if applicable) for the services provided during this visit. Depending on your insurance coverage, you may receive a charge related to this service.  I need to obtain your verbal consent now. Are you willing to proceed with your visit today? [name] has provided verbal consent on 12/02/2022 for a virtual visit (video or telephone). Piedad Climes, [place]  Date: 12/02/2022 6:49 PM  Virtual Visit via Video Note   I, Piedad Climes, connected with  [name]  ([id number], 1985/03/24) on 12/02/22 at  6:45 PM EDT by a video-enabled telemedicine application and verified that I am speaking with the correct person using two identifiers.  Location: Patient: Virtual Visit  Location Patient: Home Provider: Virtual Visit Location Provider: Home Office   I discussed the limitations of evaluation and management by telemedicine and the availability of in person appointments. The patient expressed understanding and agreed to proceed.    History of Present Illness: [name] is a 37 y.o. who identifies as a female who was assigned female at birth, and is being seen today for several days of fatigue associated with chest congestion and a cough that has become very productive of colored phlegm. Denies chest pain. Has noted some windedness with exertion. Denies recent travel or known sick contact.   OTC -- Nothing.   HPI: HPI  Problems:  Patient Active Problem List   Diagnosis Date Noted   Panic 11/14/2022   Abscess of multiple sites 11/09/2022   Smoker 03/20/2022   HTN (hypertension) 03/20/2022   Vaginitis 03/20/2022   Suicide ideation 02/18/2022   Substance abuse (HCC) 02/18/2022   Bipolar 1 disorder, manic, full remission (HCC) 02/18/2022   Abscess of external cheek, left 01/01/2022   Rash 12/25/2021   Asthma exacerbation 11/26/2021   STD exposure 11/26/2021   Insect bite 11/26/2021   Bipolar affective disorder (HCC) 11/13/2021   Encounter for well adult exam with abnormal findings 11/12/2021   Cellulitis 11/12/2021   Vaginal discharge 08/26/2021   BRBPR (bright red blood per rectum) 02/27/2021   Anal fissure 02/27/2021   Facial abscess 02/12/2021   Folliculitis 12/21/2020   Bipolar 2 disorder, major depressive episode (HCC) 12/20/2020   Vitamin D  deficiency 07/15/2020   B12 deficiency 07/15/2020   MDD (major depressive disorder), recurrent severe, without psychosis (HCC) 04/26/2020   PTSD (post-traumatic stress disorder) 01/03/2020   Allergic asthma 01/13/2018   Allergic rhinitis 03/12/2017   Class 2 obesity due to excess calories without serious comorbidity with body mass index (BMI) of 37.0 to 37.9 in adult 04/17/2016   Acute asthmatic  bronchitis 03/13/2016   Borderline personality disorder in adult [facility]) 03/13/2016   Mood disorder (HCC) 03/04/2013    Allergies:  Allergies  Allergen Reactions   Benztropine Other (See Comments)    agitation     Betadine [Povidone Iodine] Swelling   Other Other (See Comments)    Opiates- history of dependency    Lamictal [Lamotrigine] Rash   Medications:  Current Outpatient Medications:    doxycycline (VIBRA-TABS) 100 MG tablet, Take 1 tablet (100 mg total) by mouth 2 (two) times daily., Disp: 14 tablet, Rfl: 0   albuterol (VENTOLIN HFA) 108 (90 Base) MCG/ACT inhaler, Inhale 2 puffs into the lungs every 6 (six) hours as needed for wheezing or shortness of breath., Disp: 8 g, Rfl: 0   amLODipine (NORVASC) 5 MG tablet, Take 1 tablet (5 mg total) by mouth daily., Disp: 90 tablet, Rfl: 3   benzonatate (TESSALON) 100 MG capsule, Take 1 capsule (100 mg total) by mouth 3 (three) times daily as needed for cough., Disp: 30 capsule, Rfl: 0   calcium carbonate (OS-CAL - DOSED IN MG OF ELEMENTAL CALCIUM) 1250 (500 Ca) MG tablet, Take 1 tablet (1,250 mg total) by mouth daily with breakfast., Disp: 30 tablet, Rfl: 0   cetirizine (ZYRTEC) 10 MG tablet, Take 1 tablet (10 mg total) by mouth daily., Disp: 90 tablet, Rfl: 1   clonazePAM (KLONOPIN) 1 MG tablet, Take 1 tablet (1 mg total) by mouth 2 (two) times daily as needed for anxiety., Disp: 60 tablet, Rfl: 2   dupilumab (DUPIXENT) 300 MG/2ML prefilled syringe, Inject 300 mg into the skin every 14 (fourteen) days., Disp: 4 mL, Rfl: 11   famotidine (PEPCID) 20 MG tablet, Take 1 tablet (20 mg total) by mouth 2 (two) times daily., Disp: 180 tablet, Rfl: 1   ferrous sulfate 325 (65 FE) MG tablet, Take 1 tablet (325 mg total) by mouth daily., Disp: 30 tablet, Rfl: 0   FLUoxetine (PROZAC) 20 MG capsule, Take 1 capsule (20 mg total) by mouth daily., Disp: 90 capsule, Rfl: 3   fluticasone-salmeterol (ADVAIR HFA) 115-21 MCG/ACT inhaler, Inhale 2 puffs into the  lungs 2 (two) times daily., Disp: 1 each, Rfl: 5   gabapentin (NEURONTIN) 300 MG capsule, Take 300 mg by mouth 3 (three) times daily., Disp: , Rfl:    hydrOXYzine (ATARAX) 50 MG tablet, Take 1 tablet (50 mg total) by mouth 2 (two) times daily as needed for anxiety., Disp: 60 tablet, Rfl: 1   ketoconazole (NIZORAL) 2 % cream, Apply 1 Application topically daily., Disp: 30 g, Rfl: 0   montelukast (SINGULAIR) 10 MG tablet, Take 1 tablet (10 mg total) by mouth at bedtime., Disp: 90 tablet, Rfl: 1   nicotine (NICODERM CQ - DOSED IN MG/24 HOURS) 14 mg/24hr patch, Place 1 patch (14 mg total) onto the skin daily., Disp: 28 patch, Rfl: 5   OLANZapine zydis (ZYPREXA) 5 MG disintegrating tablet, Take 1 tablet (5 mg total) by mouth at bedtime., Disp: 30 tablet, Rfl: 0   omeprazole (PRILOSEC) 40 MG capsule, Take 1 capsule (40 mg total) by mouth in the morning and at bedtime. Take  1 capsule (40 mg total) by mouth 2 (two) times daily., Disp: 180 capsule, Rfl: 1   OXcarbazepine (TRILEPTAL) 300 MG tablet, Take 1 tablet (300 mg total) by mouth 2 (two) times daily., Disp: 60 tablet, Rfl: 1   paliperidone (INVEGA) 6 MG 24 hr tablet, Take 1 tablet (6 mg total) by mouth daily., Disp: 30 tablet, Rfl: 1   PORTIA-28 0.15-30 MG-MCG tablet, Take 1 tablet by mouth at bedtime., Disp: 84 tablet, Rfl: 1   prazosin (MINIPRESS) 1 MG capsule, Take 1 capsule (1 mg total) by mouth as directed. TAKE ONE IN THE AM AND 3 AT NIGHT, Disp: 120 capsule, Rfl: 0   Tiotropium Bromide Monohydrate (SPIRIVA RESPIMAT) 1.25 MCG/ACT AERS, Inhale 2 puffs into the lungs daily., Disp: 4 g, Rfl: 5   traZODone (DESYREL) 50 MG tablet, Take 0.5 tablets (25 mg total) by mouth at bedtime as needed for sleep., Disp: 15 tablet, Rfl: 1  Observations/Objective: Patient is well-developed, well-nourished in no acute distress.  Resting comfortably  at home.  Head is normocephalic, atraumatic.  No labored breathing.  Speech is clear and coherent with logical  content.  Patient is alert and oriented at baseline.   Assessment and Plan: 1. Acute bacterial bronchitis - doxycycline (VIBRA-TABS) 100 MG tablet; Take 1 tablet (100 mg total) by mouth 2 (two) times daily.  Dispense: 14 tablet; Refill: 0 - benzonatate (TESSALON) 100 MG capsule; Take 1 capsule (100 mg total) by mouth 3 (three) times daily as needed for cough.  Dispense: 30 capsule; Refill: 0  Rx Doxycycline.  Increase fluids.  Rest.  Saline nasal spray.  Probiotic.  Mucinex as directed.  Humidifier in bedroom. Tessalon per orders.  Call or return to clinic if symptoms are not improving.   Follow Up Instructions: I discussed the assessment and treatment plan with the patient. The patient was provided an opportunity to ask questions and all were answered. The patient agreed with the plan and demonstrated an understanding of the instructions.  A copy of instructions were sent to the patient via MyChart unless otherwise noted below.   The patient was advised to call back or seek an in-person evaluation if the symptoms worsen or if the condition fails to improve as anticipated.    Piedad Climes, PA-C

## 2022-12-13 ENCOUNTER — Other Ambulatory Visit: Payer: Self-pay | Admitting: Allergy & Immunology

## 2022-12-15 ENCOUNTER — Other Ambulatory Visit: Payer: Self-pay

## 2022-12-15 ENCOUNTER — Other Ambulatory Visit (HOSPITAL_COMMUNITY): Payer: Self-pay

## 2022-12-15 NOTE — Progress Notes (Signed)
Specialty Pharmacy Refill Coordination Note  [name] is a 37 y.o. female contacted today regarding refills of specialty medication(s) Dupilumab   Patient requested Delivery   Delivery date: 12/21/22   Verified address: [address] [place] [address]   Medication will be filled on 12/17/22.

## 2022-12-17 ENCOUNTER — Other Ambulatory Visit: Payer: Self-pay

## 2022-12-20 ENCOUNTER — Telehealth: Payer: Medicaid Other

## 2022-12-21 ENCOUNTER — Telehealth: Payer: Medicaid Other | Admitting: Physician Assistant

## 2022-12-21 DIAGNOSIS — J208 Acute bronchitis due to other specified organisms: Secondary | ICD-10-CM

## 2022-12-21 DIAGNOSIS — T3695XA Adverse effect of unspecified systemic antibiotic, initial encounter: Secondary | ICD-10-CM

## 2022-12-21 DIAGNOSIS — B379 Candidiasis, unspecified: Secondary | ICD-10-CM

## 2022-12-21 DIAGNOSIS — B9689 Other specified bacterial agents as the cause of diseases classified elsewhere: Secondary | ICD-10-CM | POA: Diagnosis not present

## 2022-12-21 DIAGNOSIS — J4541 Moderate persistent asthma with (acute) exacerbation: Secondary | ICD-10-CM

## 2022-12-21 MED ORDER — FLUCONAZOLE 150 MG PO TABS
150.0000 mg | ORAL_TABLET | ORAL | 0 refills | Status: DC | PRN
Start: 2022-12-21 — End: 2023-02-24

## 2022-12-21 MED ORDER — AMOXICILLIN-POT CLAVULANATE 875-125 MG PO TABS: 1.0000 | ORAL_TABLET | Freq: Two times a day (BID) | ORAL | 0 refills | Status: DC

## 2022-12-21 MED ORDER — PREDNISONE 10 MG PO TABS: ORAL_TABLET | ORAL | 0 refills | Status: DC

## 2022-12-21 MED ORDER — BENZONATATE 100 MG PO CAPS: 100.0000 mg | ORAL_CAPSULE | Freq: Three times a day (TID) | ORAL | 0 refills | Status: DC | PRN

## 2022-12-21 NOTE — Patient Instructions (Signed)
[name], thank you for joining Beverly Loveless, PA-C for today's virtual visit.  While this provider is not your primary care provider (PCP), if your PCP is located in our provider database this encounter information will be shared with them immediately following your visit.   A [facility] MyChart account gives you access to today's visit and all your visits, tests, and labs performed at [facility] " click here if you don't have a [facility] MyChart account or go to [email or web address]  Consent: (Patient) [name] provided verbal consent for this virtual visit at the beginning of the encounter.  Current Medications:  Current Outpatient Medications:    amoxicillin-clavulanate (AUGMENTIN) 875-125 MG tablet, Take 1 tablet by mouth 2 (two) times daily., Disp: 20 tablet, Rfl: 0   benzonatate (TESSALON) 100 MG capsule, Take 1-2 capsules (100-200 mg total) by mouth 3 (three) times daily as needed., Disp: 30 capsule, Rfl: 0   fluconazole (DIFLUCAN) 150 MG tablet, Take 1 tablet (150 mg total) by mouth every 3 (three) days as needed., Disp: 3 tablet, Rfl: 0   predniSONE (DELTASONE) 10 MG tablet, Take 3 tabs (30mg ) twice daily for 3 days, then 2 tabs (20mg ) twice daily for 3 days, then 1 tab (10mg ) twice daily for 3 days, then STOP, Disp: 28 tablet, Rfl: 0   VENTOLIN HFA 108 (90 Base) MCG/ACT inhaler, Inhale 2 puffs into the lungs every 6 (six) hours as needed for wheezing or shortness of breath., Disp: 18 g, Rfl: 0   amLODipine (NORVASC) 5 MG tablet, Take 1 tablet (5 mg total) by mouth daily., Disp: 90 tablet, Rfl: 3   calcium carbonate (OS-CAL - DOSED IN MG OF ELEMENTAL CALCIUM) 1250 (500 Ca) MG tablet, Take 1 tablet (1,250 mg total) by mouth daily with breakfast., Disp: 30 tablet, Rfl: 0   cetirizine (ZYRTEC) 10 MG tablet, Take 1 tablet (10 mg total) by mouth daily., Disp: 90 tablet, Rfl: 1   clonazePAM (KLONOPIN) 1 MG tablet, Take 1 tablet (1 mg total) by mouth 2  (two) times daily as needed for anxiety., Disp: 60 tablet, Rfl: 2   dupilumab (DUPIXENT) 300 MG/2ML prefilled syringe, Inject 300 mg into the skin every 14 (fourteen) days., Disp: 4 mL, Rfl: 11   famotidine (PEPCID) 20 MG tablet, Take 1 tablet (20 mg total) by mouth 2 (two) times daily., Disp: 180 tablet, Rfl: 1   ferrous sulfate 325 (65 FE) MG tablet, Take 1 tablet (325 mg total) by mouth daily., Disp: 30 tablet, Rfl: 0   FLUoxetine (PROZAC) 20 MG capsule, Take 1 capsule (20 mg total) by mouth daily., Disp: 90 capsule, Rfl: 3   fluticasone-salmeterol (ADVAIR HFA) 115-21 MCG/ACT inhaler, Inhale 2 puffs into the lungs 2 (two) times daily., Disp: 1 each, Rfl: 5   gabapentin (NEURONTIN) 300 MG capsule, Take 300 mg by mouth 3 (three) times daily., Disp: , Rfl:    Guaifenesin 1200 MG TB12, Take 1 tablet (1,200 mg total) by mouth every 12 (twelve) hours as needed., Disp: 30 tablet, Rfl: 0   hydrOXYzine (ATARAX) 50 MG tablet, Take 1 tablet (50 mg total) by mouth 2 (two) times daily as needed for anxiety., Disp: 60 tablet, Rfl: 1   ketoconazole (NIZORAL) 2 % cream, Apply 1 Application topically daily., Disp: 30 g, Rfl: 0   montelukast (SINGULAIR) 10 MG tablet, Take 1 tablet (10 mg total) by mouth at bedtime., Disp: 90 tablet, Rfl: 1   nicotine (NICODERM CQ - DOSED IN MG/24 HOURS)  14 mg/24hr patch, Place 1 patch (14 mg total) onto the skin daily., Disp: 28 patch, Rfl: 5   OLANZapine zydis (ZYPREXA) 5 MG disintegrating tablet, Take 1 tablet (5 mg total) by mouth at bedtime., Disp: 30 tablet, Rfl: 0   omeprazole (PRILOSEC) 40 MG capsule, Take 1 capsule (40 mg total) by mouth in the morning and at bedtime. Take 1 capsule (40 mg total) by mouth 2 (two) times daily., Disp: 180 capsule, Rfl: 1   OXcarbazepine (TRILEPTAL) 300 MG tablet, Take 1 tablet (300 mg total) by mouth 2 (two) times daily., Disp: 60 tablet, Rfl: 1   paliperidone (INVEGA) 6 MG 24 hr tablet, Take 1 tablet (6 mg total) by mouth daily., Disp: 30  tablet, Rfl: 1   PORTIA-28 0.15-30 MG-MCG tablet, Take 1 tablet by mouth at bedtime., Disp: 84 tablet, Rfl: 1   prazosin (MINIPRESS) 1 MG capsule, Take 1 capsule (1 mg total) by mouth as directed. TAKE ONE IN THE AM AND 3 AT NIGHT, Disp: 120 capsule, Rfl: 0   Tiotropium Bromide Monohydrate (SPIRIVA RESPIMAT) 1.25 MCG/ACT AERS, Inhale 2 puffs into the lungs daily., Disp: 4 g, Rfl: 5   traZODone (DESYREL) 50 MG tablet, Take 0.5 tablets (25 mg total) by mouth at bedtime as needed for sleep., Disp: 15 tablet, Rfl: 1   Medications ordered in this encounter:  Meds ordered this encounter  Medications   amoxicillin-clavulanate (AUGMENTIN) 875-125 MG tablet    Sig: Take 1 tablet by mouth 2 (two) times daily.    Dispense:  20 tablet    Refill:  0    Order Specific Question:   Supervising Provider    Answer:   Merrilee Jansky [[id number]]   benzonatate (TESSALON) 100 MG capsule    Sig: Take 1-2 capsules (100-200 mg total) by mouth 3 (three) times daily as needed.    Dispense:  30 capsule    Refill:  0    Order Specific Question:   Supervising Provider    Answer:   Merrilee Jansky [[id number]]   predniSONE (DELTASONE) 10 MG tablet    Sig: Take 3 tabs (30mg ) twice daily for 3 days, then 2 tabs (20mg ) twice daily for 3 days, then 1 tab (10mg ) twice daily for 3 days, then STOP    Dispense:  28 tablet    Refill:  0    Order Specific Question:   Supervising Provider    Answer:   Merrilee Jansky [[id number]]   fluconazole (DIFLUCAN) 150 MG tablet    Sig: Take 1 tablet (150 mg total) by mouth every 3 (three) days as needed.    Dispense:  3 tablet    Refill:  0    Order Specific Question:   Supervising Provider    Answer:   Merrilee Jansky [id number]     *If you need refills on other medications prior to your next appointment, please contact your pharmacy*  Follow-Up: Call back or seek an in-person evaluation if the symptoms worsen or if the condition fails to improve as anticipated.  [place]  [facility] [phone]  Other Instructions Acute Bronchitis, Adult  Acute bronchitis is sudden inflammation of the main airways (bronchi) that come off the windpipe (trachea) in the lungs. The swelling causes the airways to get smaller and make more mucus than normal. This can make it hard to breathe and can cause coughing or noisy breathing (wheezing). Acute bronchitis may last several weeks. The cough may last longer. Allergies, asthma, and  exposure to smoke may make the condition worse. What are the causes? This condition can be caused by germs and by substances that irritate the lungs, including: Cold and flu viruses. The most common cause of this condition is the virus that causes the common cold. Bacteria. This is less common. Breathing in substances that irritate the lungs, including: Smoke from cigarettes and other forms of tobacco. Dust and pollen. Fumes from household cleaning products, gases, or burned fuel. Indoor or outdoor air pollution. What increases the risk? The following factors may make you more likely to develop this condition: A weak body's defense system, also called the immune system. A condition that affects your lungs and breathing, such as asthma. What are the signs or symptoms? Common symptoms of this condition include: Coughing. This may bring up clear, yellow, or green mucus from your lungs (sputum). Wheezing. Runny or stuffy nose. Having too much mucus in your lungs (chest congestion). Shortness of breath. Aches and pains, including sore throat or chest. How is this diagnosed? This condition is usually diagnosed based on: Your symptoms and medical history. A physical exam. You may also have other tests, including tests to rule out other conditions, such as pneumonia. These tests include: A test of lung function. Test of a mucus sample to look for the presence of bacteria. Tests to check the oxygen level in your blood. Blood tests. Chest  X-ray. How is this treated? Most cases of acute bronchitis clear up over time without treatment. Your health care provider may recommend: Drinking more fluids to help thin your mucus so it is easier to cough up. Taking inhaled medicine (inhaler) to improve air flow in and out of your lungs. Using a vaporizer or a humidifier. These are machines that add water to the air to help you breathe better. Taking a medicine that thins mucus and clears congestion (expectorant). Taking a medicine that prevents or stops coughing (cough suppressant). It is not common to take an antibiotic medicine for this condition. Follow these instructions at home:  Take over-the-counter and prescription medicines only as told by your health care provider. Use an inhaler, vaporizer, or humidifier as told by your health care provider. Take two teaspoons (10 mL) of honey at bedtime to lessen coughing at night. Drink enough fluid to keep your urine pale yellow. Do not use any products that contain nicotine or tobacco. These products include cigarettes, chewing tobacco, and vaping devices, such as e-cigarettes. If you need help quitting, ask your health care provider. Get plenty of rest. Return to your normal activities as told by your health care provider. Ask your health care provider what activities are safe for you. Keep all follow-up visits. This is important. How is this prevented? To lower your risk of getting this condition again: Wash your hands often with soap and water for at least 20 seconds. If soap and water are not available, use hand sanitizer. Avoid contact with people who have cold symptoms. Try not to touch your mouth, nose, or eyes with your hands. Avoid breathing in smoke or chemical fumes. Breathing smoke or chemical fumes will make your condition worse. Get the flu shot every year. Contact a health care provider if: Your symptoms do not improve after 2 weeks. You have trouble coughing up the  mucus. Your cough keeps you awake at night. You have a fever. Get help right away if you: Cough up blood. Feel pain in your chest. Have severe shortness of breath. Faint or keep feeling like  you are going to faint. Have a severe headache. Have a fever or chills that get worse. These symptoms may represent a serious problem that is an emergency. Do not wait to see if the symptoms will go away. Get medical help right away. Call your local emergency services (911 in the [place] Do not drive yourself to the hospital. Summary Acute bronchitis is inflammation of the main airways (bronchi) that come off the windpipe (trachea) in the lungs. The swelling causes the airways to get smaller and make more mucus than normal. Drinking more fluids can help thin your mucus so it is easier to cough up. Take over-the-counter and prescription medicines only as told by your health care provider. Do not use any products that contain nicotine or tobacco. These products include cigarettes, chewing tobacco, and vaping devices, such as e-cigarettes. If you need help quitting, ask your health care provider. Contact a health care provider if your symptoms do not improve after 2 weeks. This information is not intended to replace advice given to you by your health care provider. Make sure you discuss any questions you have with your health care provider. Document Revised: 05/01/2021 Document Reviewed: 05/22/2020 Elsevier Patient Education  2024 Elsevier Inc.    If you have been instructed to have an in-person evaluation today at a local Urgent Care facility, please use the link below. It will take you to a list of all of our available [facility], including address, phone number and hours of operation. Please do not delay care.  [facility]  If you or a family member do not have a primary care provider, use the link below to schedule a visit and establish care. When you choose a [facility]  primary care physician or advanced practice provider, you gain a long-term partner in health. Find a Primary Care Provider  Learn more about [facility] [facility]'s in-office and virtual care options:  - Get Care Now

## 2022-12-21 NOTE — Progress Notes (Signed)
Virtual Visit Consent   [name], you are scheduled for a virtual visit with a [facility] provider today. Just as with appointments in the office, your consent must be obtained to participate. Your consent will be active for this visit and any virtual visit you may have with one of our providers in the next 365 days. If you have a MyChart account, a copy of this consent can be sent to you electronically.  As this is a virtual visit, video technology does not allow for your provider to perform a traditional examination. This may limit your provider's ability to fully assess your condition. If your provider identifies any concerns that need to be evaluated in person or the need to arrange testing (such as labs, EKG, etc.), we will make arrangements to do so. Although advances in technology are sophisticated, we cannot ensure that it will always work on either your end or our end. If the connection with a video visit is poor, the visit may have to be switched to a telephone visit. With either a video or telephone visit, we are not always able to ensure that we have a secure connection.  By engaging in this virtual visit, you consent to the provision of healthcare and authorize for your insurance to be billed (if applicable) for the services provided during this visit. Depending on your insurance coverage, you may receive a charge related to this service.  I need to obtain your verbal consent now. Are you willing to proceed with your visit today? [name] has provided verbal consent on 12/21/2022 for a virtual visit (video or telephone). Beverly Loveless, PA-C  Date: 12/21/2022 6:28 PM  Virtual Visit via Video Note   I, [name], connected with  [name]  ([id number], 10-11-1985) on 12/21/22 at  5:45 PM EST by a video-enabled telemedicine application and verified that I am speaking with the correct person using two identifiers.  Location: Patient: Virtual Visit  Location Patient: Home Provider: Virtual Visit Location Provider: Home Office   I discussed the limitations of evaluation and management by telemedicine and the availability of in person appointments. The patient expressed understanding and agreed to proceed.    History of Present Illness: [name] is a 37 y.o. who identifies as a female who was assigned female at birth, and is being seen today for cough and congestion.  HPI: URI  This is a new problem. The current episode started in the past 7 days. The problem has been gradually worsening. Maximum temperature: subjective fevers. Associated symptoms include chest pain (tightness), congestion, coughing (lying makes worse), diarrhea, headaches, nausea (mild), rhinorrhea, a sore throat and wheezing. Pertinent negatives include no ear pain, plugged ear sensation, sinus pain or vomiting. Associated symptoms comments: Diaphoresis. She has tried nothing for the symptoms. The treatment provided no relief.     Problems:  Patient Active Problem List   Diagnosis Date Noted   Panic 11/14/2022   Abscess of multiple sites 11/09/2022   Smoker 03/20/2022   HTN (hypertension) 03/20/2022   Vaginitis 03/20/2022   Suicide ideation 02/18/2022   Substance abuse (HCC) 02/18/2022   Bipolar 1 disorder, manic, full remission (HCC) 02/18/2022   Abscess of external cheek, left 01/01/2022   Rash 12/25/2021   Asthma exacerbation 11/26/2021   STD exposure 11/26/2021   Insect bite 11/26/2021   Bipolar affective disorder (HCC) 11/13/2021   Encounter for well adult exam with abnormal findings 11/12/2021   Cellulitis 11/12/2021  Vaginal discharge 08/26/2021   BRBPR (bright red blood per rectum) 02/27/2021   Anal fissure 02/27/2021   Facial abscess 02/12/2021   Folliculitis 12/21/2020   Bipolar 2 disorder, major depressive episode (HCC) 12/20/2020   Vitamin D deficiency 07/15/2020   B12 deficiency 07/15/2020   MDD (major depressive disorder), recurrent  severe, without psychosis (HCC) 04/26/2020   PTSD (post-traumatic stress disorder) 01/03/2020   Allergic asthma 01/13/2018   Allergic rhinitis 03/12/2017   Class 2 obesity due to excess calories without serious comorbidity with body mass index (BMI) of 37.0 to 37.9 in adult 04/17/2016   Acute asthmatic bronchitis 03/13/2016   Borderline personality disorder in adult [facility]) 03/13/2016   Mood disorder (HCC) 03/04/2013    Allergies:  Allergies  Allergen Reactions   Benztropine Other (See Comments)    agitation     Betadine [Povidone Iodine] Swelling   Other Other (See Comments)    Opiates- history of dependency    Lamictal [Lamotrigine] Rash   Medications:  Current Outpatient Medications:    amoxicillin-clavulanate (AUGMENTIN) 875-125 MG tablet, Take 1 tablet by mouth 2 (two) times daily., Disp: 20 tablet, Rfl: 0   benzonatate (TESSALON) 100 MG capsule, Take 1-2 capsules (100-200 mg total) by mouth 3 (three) times daily as needed., Disp: 30 capsule, Rfl: 0   fluconazole (DIFLUCAN) 150 MG tablet, Take 1 tablet (150 mg total) by mouth every 3 (three) days as needed., Disp: 3 tablet, Rfl: 0   predniSONE (DELTASONE) 10 MG tablet, Take 3 tabs (30mg ) twice daily for 3 days, then 2 tabs (20mg ) twice daily for 3 days, then 1 tab (10mg ) twice daily for 3 days, then STOP, Disp: 28 tablet, Rfl: 0   VENTOLIN HFA 108 (90 Base) MCG/ACT inhaler, Inhale 2 puffs into the lungs every 6 (six) hours as needed for wheezing or shortness of breath., Disp: 18 g, Rfl: 0   amLODipine (NORVASC) 5 MG tablet, Take 1 tablet (5 mg total) by mouth daily., Disp: 90 tablet, Rfl: 3   calcium carbonate (OS-CAL - DOSED IN MG OF ELEMENTAL CALCIUM) 1250 (500 Ca) MG tablet, Take 1 tablet (1,250 mg total) by mouth daily with breakfast., Disp: 30 tablet, Rfl: 0   cetirizine (ZYRTEC) 10 MG tablet, Take 1 tablet (10 mg total) by mouth daily., Disp: 90 tablet, Rfl: 1   clonazePAM (KLONOPIN) 1 MG tablet, Take 1 tablet (1 mg total) by  mouth 2 (two) times daily as needed for anxiety., Disp: 60 tablet, Rfl: 2   dupilumab (DUPIXENT) 300 MG/2ML prefilled syringe, Inject 300 mg into the skin every 14 (fourteen) days., Disp: 4 mL, Rfl: 11   famotidine (PEPCID) 20 MG tablet, Take 1 tablet (20 mg total) by mouth 2 (two) times daily., Disp: 180 tablet, Rfl: 1   ferrous sulfate 325 (65 FE) MG tablet, Take 1 tablet (325 mg total) by mouth daily., Disp: 30 tablet, Rfl: 0   FLUoxetine (PROZAC) 20 MG capsule, Take 1 capsule (20 mg total) by mouth daily., Disp: 90 capsule, Rfl: 3   fluticasone-salmeterol (ADVAIR HFA) 115-21 MCG/ACT inhaler, Inhale 2 puffs into the lungs 2 (two) times daily., Disp: 1 each, Rfl: 5   gabapentin (NEURONTIN) 300 MG capsule, Take 300 mg by mouth 3 (three) times daily., Disp: , Rfl:    Guaifenesin 1200 MG TB12, Take 1 tablet (1,200 mg total) by mouth every 12 (twelve) hours as needed., Disp: 30 tablet, Rfl: 0   hydrOXYzine (ATARAX) 50 MG tablet, Take 1 tablet (50 mg total) by mouth  2 (two) times daily as needed for anxiety., Disp: 60 tablet, Rfl: 1   ketoconazole (NIZORAL) 2 % cream, Apply 1 Application topically daily., Disp: 30 g, Rfl: 0   montelukast (SINGULAIR) 10 MG tablet, Take 1 tablet (10 mg total) by mouth at bedtime., Disp: 90 tablet, Rfl: 1   nicotine (NICODERM CQ - DOSED IN MG/24 HOURS) 14 mg/24hr patch, Place 1 patch (14 mg total) onto the skin daily., Disp: 28 patch, Rfl: 5   OLANZapine zydis (ZYPREXA) 5 MG disintegrating tablet, Take 1 tablet (5 mg total) by mouth at bedtime., Disp: 30 tablet, Rfl: 0   omeprazole (PRILOSEC) 40 MG capsule, Take 1 capsule (40 mg total) by mouth in the morning and at bedtime. Take 1 capsule (40 mg total) by mouth 2 (two) times daily., Disp: 180 capsule, Rfl: 1   OXcarbazepine (TRILEPTAL) 300 MG tablet, Take 1 tablet (300 mg total) by mouth 2 (two) times daily., Disp: 60 tablet, Rfl: 1   paliperidone (INVEGA) 6 MG 24 hr tablet, Take 1 tablet (6 mg total) by mouth daily.,  Disp: 30 tablet, Rfl: 1   PORTIA-28 0.15-30 MG-MCG tablet, Take 1 tablet by mouth at bedtime., Disp: 84 tablet, Rfl: 1   prazosin (MINIPRESS) 1 MG capsule, Take 1 capsule (1 mg total) by mouth as directed. TAKE ONE IN THE AM AND 3 AT NIGHT, Disp: 120 capsule, Rfl: 0   Tiotropium Bromide Monohydrate (SPIRIVA RESPIMAT) 1.25 MCG/ACT AERS, Inhale 2 puffs into the lungs daily., Disp: 4 g, Rfl: 5   traZODone (DESYREL) 50 MG tablet, Take 0.5 tablets (25 mg total) by mouth at bedtime as needed for sleep., Disp: 15 tablet, Rfl: 1  Observations/Objective: Patient is well-developed, well-nourished in no acute distress.  Resting comfortably at home.  Head is normocephalic, atraumatic.  No labored breathing.  Speech is clear and coherent with logical content.  Patient is alert and oriented at baseline.    Assessment and Plan: 1. Acute bacterial bronchitis - amoxicillin-clavulanate (AUGMENTIN) 875-125 MG tablet; Take 1 tablet by mouth 2 (two) times daily.  Dispense: 20 tablet; Refill: 0 - benzonatate (TESSALON) 100 MG capsule; Take 1-2 capsules (100-200 mg total) by mouth 3 (three) times daily as needed.  Dispense: 30 capsule; Refill: 0 - predniSONE (DELTASONE) 10 MG tablet; Take 3 tabs (30mg ) twice daily for 3 days, then 2 tabs (20mg ) twice daily for 3 days, then 1 tab (10mg ) twice daily for 3 days, then STOP  Dispense: 28 tablet; Refill: 0  2. Moderate persistent asthmatic bronchitis with acute exacerbation - predniSONE (DELTASONE) 10 MG tablet; Take 3 tabs (30mg ) twice daily for 3 days, then 2 tabs (20mg ) twice daily for 3 days, then 1 tab (10mg ) twice daily for 3 days, then STOP  Dispense: 28 tablet; Refill: 0  3. Antibiotic-induced yeast infection - fluconazole (DIFLUCAN) 150 MG tablet; Take 1 tablet (150 mg total) by mouth every 3 (three) days as needed.  Dispense: 3 tablet; Refill: 0  - Worsening over a week despite OTC medications - Will treat with Augmentin and tessalon perles - Can continue  Mucinex  - Prednisone for asthma - Push fluids.  - Rest.  - Steam and humidifier can help - Diflucan given as prophylaxis as patient tends to get vaginal yeast infections with antibiotic use - Seek in person evaluation if worsening or symptoms fail to improve    Follow Up Instructions: I discussed the assessment and treatment plan with the patient. The patient was provided an opportunity to ask questions  and all were answered. The patient agreed with the plan and demonstrated an understanding of the instructions.  A copy of instructions were sent to the patient via MyChart unless otherwise noted below.    The patient was advised to call back or seek an in-person evaluation if the symptoms worsen or if the condition fails to improve as anticipated.    Beverly Loveless, PA-C

## 2022-12-22 ENCOUNTER — Telehealth: Payer: Medicaid Other | Admitting: Physician Assistant

## 2022-12-22 DIAGNOSIS — Z7189 Other specified counseling: Secondary | ICD-10-CM

## 2022-12-22 NOTE — Progress Notes (Signed)
Patient was evaluated via video yesterday. One of her medications, Tessalon, requires a PA. No covered alternatives with Medicaid and other Rx not safe giving her medical history and chronic medications so PA completed via phone.  Awaiting determination.   Prior Auth case # [id number]  No charge for visit.

## 2022-12-23 ENCOUNTER — Encounter: Payer: Self-pay | Admitting: Physician Assistant

## 2022-12-24 ENCOUNTER — Ambulatory Visit (INDEPENDENT_AMBULATORY_CARE_PROVIDER_SITE_OTHER): Payer: Medicaid Other | Admitting: Nurse Practitioner

## 2022-12-24 ENCOUNTER — Encounter: Payer: Self-pay | Admitting: Nurse Practitioner

## 2022-12-24 VITALS — BP 110/70 | HR 64 | Wt 266.0 lb

## 2022-12-24 DIAGNOSIS — Z113 Encounter for screening for infections with a predominantly sexual mode of transmission: Secondary | ICD-10-CM

## 2022-12-24 DIAGNOSIS — Z30017 Encounter for initial prescription of implantable subdermal contraceptive: Secondary | ICD-10-CM

## 2022-12-24 DIAGNOSIS — N898 Other specified noninflammatory disorders of vagina: Secondary | ICD-10-CM

## 2022-12-24 LAB — PREGNANCY, URINE: Preg Test, Ur: NEGATIVE

## 2022-12-24 NOTE — Progress Notes (Signed)
37 y.o. G0P0000 female presents for Nexplanon insertion.  She has been counseled about alternative types of contraception and has decided this long acting method is the best for her.  Procedure, risks and benefits have all been explained.  She has the following questions today:  None. Currently taking COCs.  Complains of vaginal discharge. Would like STD screening today. Negative STD panel in October with PCP, but she had new partner 2 weeks ago.   LMP:  12/16/2022   After all questions were answered, consent was obtained.    Past Medical History:  Diagnosis Date   Allergy    Anxiety    Asthma    Bipolar 2 disorder (HCC)    Borderline personality disorder (HCC)    Depression    Eczema    Former smoker    Obesity    PTSD (post-traumatic stress disorder)    Urticaria     Past Surgical History:  Procedure Laterality Date   Thumb surgery Left     Current Outpatient Medications on File Prior to Visit  Medication Sig Dispense Refill   amLODipine (NORVASC) 5 MG tablet Take 1 tablet (5 mg total) by mouth daily. 90 tablet 3   calcium carbonate (OS-CAL - DOSED IN MG OF ELEMENTAL CALCIUM) 1250 (500 Ca) MG tablet Take 1 tablet (1,250 mg total) by mouth daily with breakfast. 30 tablet 0   cetirizine (ZYRTEC) 10 MG tablet Take 1 tablet (10 mg total) by mouth daily. 90 tablet 1   clonazePAM (KLONOPIN) 1 MG tablet Take 1 tablet (1 mg total) by mouth 2 (two) times daily as needed for anxiety. 60 tablet 2   dupilumab (DUPIXENT) 300 MG/2ML prefilled syringe Inject 300 mg into the skin every 14 (fourteen) days. 4 mL 11   famotidine (PEPCID) 20 MG tablet Take 1 tablet (20 mg total) by mouth 2 (two) times daily. 180 tablet 1   ferrous sulfate 325 (65 FE) MG tablet Take 1 tablet (325 mg total) by mouth daily. 30 tablet 0   fluconazole (DIFLUCAN) 150 MG tablet Take 1 tablet (150 mg total) by mouth every 3 (three) days as needed. 3 tablet 0   FLUoxetine (PROZAC) 20 MG capsule Take 1 capsule (20 mg total)  by mouth daily. 90 capsule 3   fluticasone-salmeterol (ADVAIR HFA) 115-21 MCG/ACT inhaler Inhale 2 puffs into the lungs 2 (two) times daily. 1 each 5   gabapentin (NEURONTIN) 300 MG capsule Take 300 mg by mouth 3 (three) times daily.     Guaifenesin 1200 MG TB12 Take 1 tablet (1,200 mg total) by mouth every 12 (twelve) hours as needed. 30 tablet 0   hydrOXYzine (ATARAX) 50 MG tablet Take 1 tablet (50 mg total) by mouth 2 (two) times daily as needed for anxiety. 60 tablet 1   ketoconazole (NIZORAL) 2 % cream Apply 1 Application topically daily. 30 g 0   montelukast (SINGULAIR) 10 MG tablet Take 1 tablet (10 mg total) by mouth at bedtime. 90 tablet 1   nicotine (NICODERM CQ - DOSED IN MG/24 HOURS) 14 mg/24hr patch Place 1 patch (14 mg total) onto the skin daily. 28 patch 5   OLANZapine zydis (ZYPREXA) 5 MG disintegrating tablet Take 1 tablet (5 mg total) by mouth at bedtime. 30 tablet 0   omeprazole (PRILOSEC) 40 MG capsule Take 1 capsule (40 mg total) by mouth in the morning and at bedtime. Take 1 capsule (40 mg total) by mouth 2 (two) times daily. 180 capsule 1   OXcarbazepine (TRILEPTAL)  300 MG tablet Take 1 tablet (300 mg total) by mouth 2 (two) times daily. 60 tablet 1   paliperidone (INVEGA) 6 MG 24 hr tablet Take 1 tablet (6 mg total) by mouth daily. 30 tablet 1   PORTIA-28 0.15-30 MG-MCG tablet Take 1 tablet by mouth at bedtime. 84 tablet 1   Tiotropium Bromide Monohydrate (SPIRIVA RESPIMAT) 1.25 MCG/ACT AERS Inhale 2 puffs into the lungs daily. 4 g 5   traZODone (DESYREL) 50 MG tablet Take 0.5 tablets (25 mg total) by mouth at bedtime as needed for sleep. 15 tablet 1   VENTOLIN HFA 108 (90 Base) MCG/ACT inhaler Inhale 2 puffs into the lungs every 6 (six) hours as needed for wheezing or shortness of breath. 18 g 0   prazosin (MINIPRESS) 1 MG capsule Take 1 capsule (1 mg total) by mouth as directed. TAKE ONE IN THE AM AND 3 AT NIGHT (Patient not taking: Reported on 12/24/2022) 120 capsule 0    No current facility-administered medications on file prior to visit.   Allergies  Allergen Reactions   Benztropine Other (See Comments)    agitation     Betadine [Povidone Iodine] Swelling   Other Other (See Comments)    Opiates- history of dependency    Lamictal [Lamotrigine] Rash    Vitals:   12/24/22 1412  BP: 110/70  Pulse: 64  SpO2: 100%   Physical Exam  Procedure: Patient placed supine on exam table with her left arm flexed at the elbow. The location for insertion site was identified 8-10 cm from epicondyle and 3-5 cm posterior.  Area cleansed with Betadine x 3.  Insertion site and path of insertion was anesthetized with 1% Lidocaine without epinephrine, 1.5 cc total.  Using Nexplanon device (and after confirming presence of rod in device), skin was pierced and then elevated along insertion path, passing device just under the skin.  Rod released and device inserted.  Rod palpated easily.  Steri-strips applied and a pressure bandage was place over the site.  Entire procedure performed with sterile technique.  Carolynn Serve, CMA assisted with procedure.   Assessment:   Nexplanon insertion - Plan: Pregnancy, urine, Insertion of implanon rod. Back up contraception first 7 days. Educated on site care and bleeding patterns that can occur.   Vaginal discharge - Plan: SureSwab Advanced Vaginitis Plus,TMA  Screening examination for STD (sexually transmitted disease) - Plan: SureSwab Advanced Vaginitis Plus,TMA. Neg HPV/RPR 11/16/22.    Plan:  Post procedure instructions reviewed with pt.  Questions answered.  Pt knows to call with any concerns or questions.  Pt is aware removal is due by 3 calendar years from today.

## 2022-12-25 LAB — SURESWAB® ADVANCED VAGINITIS PLUS,TMA
C. trachomatis RNA, TMA: NOT DETECTED
CANDIDA SPECIES: DETECTED — AB
Candida glabrata: NOT DETECTED
N. gonorrhoeae RNA, TMA: NOT DETECTED
SURESWAB(R) ADV BACTERIAL VAGINOSIS(BV),TMA: NEGATIVE
TRICHOMONAS VAGINALIS (TV),TMA: NOT DETECTED

## 2023-01-12 ENCOUNTER — Other Ambulatory Visit (HOSPITAL_COMMUNITY): Payer: Self-pay

## 2023-01-13 ENCOUNTER — Other Ambulatory Visit (HOSPITAL_COMMUNITY): Payer: Self-pay

## 2023-01-13 ENCOUNTER — Other Ambulatory Visit: Payer: Self-pay

## 2023-01-13 ENCOUNTER — Telehealth: Payer: Medicaid Other

## 2023-01-13 ENCOUNTER — Other Ambulatory Visit: Payer: Self-pay | Admitting: Internal Medicine

## 2023-01-14 ENCOUNTER — Telehealth: Payer: Medicaid Other | Admitting: Nurse Practitioner

## 2023-01-14 DIAGNOSIS — J4541 Moderate persistent asthma with (acute) exacerbation: Secondary | ICD-10-CM

## 2023-01-14 MED ORDER — PREDNISONE 20 MG PO TABS: 20.0000 mg | ORAL_TABLET | Freq: Two times a day (BID) | ORAL | 0 refills | Status: AC

## 2023-01-14 MED ORDER — VENTOLIN HFA 108 (90 BASE) MCG/ACT IN AERS: 2.0000 | INHALATION_SPRAY | RESPIRATORY_TRACT | 1 refills | Status: DC | PRN

## 2023-01-14 NOTE — Progress Notes (Signed)
Virtual Visit Consent   [name], you are scheduled for a virtual visit with a [facility] provider today. Just as with appointments in the office, your consent must be obtained to participate. Your consent will be active for this visit and any virtual visit you may have with one of our providers in the next 365 days. If you have a MyChart account, a copy of this consent can be sent to you electronically.  As this is a virtual visit, video technology does not allow for your provider to perform a traditional examination. This may limit your provider's ability to fully assess your condition. If your provider identifies any concerns that need to be evaluated in person or the need to arrange testing (such as labs, EKG, etc.), we will make arrangements to do so. Although advances in technology are sophisticated, we cannot ensure that it will always work on either your end or our end. If the connection with a video visit is poor, the visit may have to be switched to a telephone visit. With either a video or telephone visit, we are not always able to ensure that we have a secure connection.  By engaging in this virtual visit, you consent to the provision of healthcare and authorize for your insurance to be billed (if applicable) for the services provided during this visit. Depending on your insurance coverage, you may receive a charge related to this service.  I need to obtain your verbal consent now. Are you willing to proceed with your visit today? [name] has provided verbal consent on 01/14/2023 for a virtual visit (video or telephone). Beverly Simas, FNP  Date: 01/14/2023 3:59 PM  Virtual Visit via Video Note   I, [name], connected with  [name]  ([id number], Jan 13, 1986) on 01/14/23 at  4:00 PM EST by a video-enabled telemedicine application and verified that I am speaking with the correct person using two identifiers.  Location: Patient: Virtual Visit Location Patient:  Home Provider: Virtual Visit Location Provider: Home Office   I discussed the limitations of evaluation and management by telemedicine and the availability of in person appointments. The patient expressed understanding and agreed to proceed.    History of Present Illness: [name] is a 37 y.o. who identifies as a female who was assigned female at birth, and is being seen today for a cough with congestion   She feels SOB when she goes up and down the stairs   She has had a cough for 3-4 days  Wheezing for the past 2 days   Has been using her rescue inhaler 3-4 times a day  Has been using Spiriva once daily and Advair twice daily  She sees allergy and asthma at the end of the month   She was treated for bronchitis   She does currently smoke cigarettes   Most recently treated for bronchitis 12/21/22 with prednisone and Augmentin  Prior to that was end of October -used doxycycline at that time  Did not have good outcomes after doxycyline   Uses Singulair and Zyrtec daily   Problems:  Patient Active Problem List   Diagnosis Date Noted   Panic 11/14/2022   Abscess of multiple sites 11/09/2022   Smoker 03/20/2022   HTN (hypertension) 03/20/2022   Vaginitis 03/20/2022   Suicide ideation 02/18/2022   Substance abuse (HCC) 02/18/2022   Bipolar 1 disorder, manic, full remission (HCC) 02/18/2022   Abscess of external cheek, left 01/01/2022   Rash 12/25/2021  Asthma exacerbation 11/26/2021   STD exposure 11/26/2021   Insect bite 11/26/2021   Bipolar affective disorder (HCC) 11/13/2021   Encounter for well adult exam with abnormal findings 11/12/2021   Cellulitis 11/12/2021   Vaginal discharge 08/26/2021   BRBPR (bright red blood per rectum) 02/27/2021   Anal fissure 02/27/2021   Facial abscess 02/12/2021   Folliculitis 12/21/2020   Bipolar 2 disorder, major depressive episode (HCC) 12/20/2020   Vitamin D deficiency 07/15/2020   B12 deficiency 07/15/2020   MDD (major  depressive disorder), recurrent severe, without psychosis (HCC) 04/26/2020   PTSD (post-traumatic stress disorder) 01/03/2020   Allergic asthma 01/13/2018   Allergic rhinitis 03/12/2017   Class 2 obesity due to excess calories without serious comorbidity with body mass index (BMI) of 37.0 to 37.9 in adult 04/17/2016   Acute asthmatic bronchitis 03/13/2016   Borderline personality disorder in adult [facility]) 03/13/2016   Mood disorder (HCC) 03/04/2013    Allergies:  Allergies  Allergen Reactions   Benztropine Other (See Comments)    agitation     Betadine [Povidone Iodine] Swelling   Other Other (See Comments)    Opiates- history of dependency    Lamictal [Lamotrigine] Rash   Medications:  Current Outpatient Medications:    amLODipine (NORVASC) 5 MG tablet, Take 1 tablet (5 mg total) by mouth daily., Disp: 90 tablet, Rfl: 3   calcium carbonate (OS-CAL - DOSED IN MG OF ELEMENTAL CALCIUM) 1250 (500 Ca) MG tablet, Take 1 tablet (1,250 mg total) by mouth daily with breakfast., Disp: 30 tablet, Rfl: 0   cetirizine (ZYRTEC) 10 MG tablet, Take 1 tablet (10 mg total) by mouth daily., Disp: 90 tablet, Rfl: 1   clonazePAM (KLONOPIN) 1 MG tablet, Take 1 tablet (1 mg total) by mouth 2 (two) times daily as needed for anxiety., Disp: 60 tablet, Rfl: 2   dupilumab (DUPIXENT) 300 MG/2ML prefilled syringe, Inject 300 mg into the skin every 14 (fourteen) days., Disp: 4 mL, Rfl: 11   famotidine (PEPCID) 20 MG tablet, Take 1 tablet (20 mg total) by mouth 2 (two) times daily., Disp: 180 tablet, Rfl: 1   ferrous sulfate 325 (65 FE) MG tablet, Take 1 tablet (325 mg total) by mouth daily., Disp: 30 tablet, Rfl: 0   fluconazole (DIFLUCAN) 150 MG tablet, Take 1 tablet (150 mg total) by mouth every 3 (three) days as needed., Disp: 3 tablet, Rfl: 0   FLUoxetine (PROZAC) 20 MG capsule, Take 1 capsule (20 mg total) by mouth daily., Disp: 90 capsule, Rfl: 3   fluticasone-salmeterol (ADVAIR HFA) 115-21 MCG/ACT inhaler,  Inhale 2 puffs into the lungs 2 (two) times daily., Disp: 1 each, Rfl: 5   gabapentin (NEURONTIN) 300 MG capsule, Take 300 mg by mouth 3 (three) times daily., Disp: , Rfl:    Guaifenesin 1200 MG TB12, Take 1 tablet (1,200 mg total) by mouth every 12 (twelve) hours as needed., Disp: 30 tablet, Rfl: 0   hydrOXYzine (ATARAX) 50 MG tablet, Take 1 tablet (50 mg total) by mouth 2 (two) times daily as needed for anxiety., Disp: 60 tablet, Rfl: 1   ketoconazole (NIZORAL) 2 % cream, Apply 1 Application topically daily., Disp: 30 g, Rfl: 0   montelukast (SINGULAIR) 10 MG tablet, Take 1 tablet (10 mg total) by mouth at bedtime., Disp: 90 tablet, Rfl: 1   nicotine (NICODERM CQ - DOSED IN MG/24 HOURS) 14 mg/24hr patch, Place 1 patch (14 mg total) onto the skin daily., Disp: 28 patch, Rfl: 5   OLANZapine  zydis (ZYPREXA) 5 MG disintegrating tablet, Take 1 tablet (5 mg total) by mouth at bedtime., Disp: 30 tablet, Rfl: 0   omeprazole (PRILOSEC) 40 MG capsule, Take 1 capsule (40 mg total) by mouth in the morning and at bedtime. Take 1 capsule (40 mg total) by mouth 2 (two) times daily., Disp: 180 capsule, Rfl: 1   OXcarbazepine (TRILEPTAL) 300 MG tablet, Take 1 tablet (300 mg total) by mouth 2 (two) times daily., Disp: 60 tablet, Rfl: 1   paliperidone (INVEGA) 6 MG 24 hr tablet, Take 1 tablet (6 mg total) by mouth daily., Disp: 30 tablet, Rfl: 1   PORTIA-28 0.15-30 MG-MCG tablet, Take 1 tablet by mouth at bedtime., Disp: 84 tablet, Rfl: 1   prazosin (MINIPRESS) 1 MG capsule, Take 1 capsule (1 mg total) by mouth as directed. TAKE ONE IN THE AM AND 3 AT NIGHT (Patient not taking: Reported on 12/24/2022), Disp: 120 capsule, Rfl: 0   Tiotropium Bromide Monohydrate (SPIRIVA RESPIMAT) 1.25 MCG/ACT AERS, Inhale 2 puffs into the lungs daily., Disp: 4 g, Rfl: 5   traZODone (DESYREL) 50 MG tablet, Take 0.5 tablets (25 mg total) by mouth at bedtime as needed for sleep., Disp: 15 tablet, Rfl: 1   VENTOLIN HFA 108 (90 Base)  MCG/ACT inhaler, Inhale 2 puffs into the lungs every 6 (six) hours as needed for wheezing or shortness of breath., Disp: 18 g, Rfl: 0  Observations/Objective: Patient is well-developed, well-nourished in no acute distress.  Resting comfortably  at home.  Head is normocephalic, atraumatic.  No labored breathing.  Speech is clear and coherent with logical content.  Patient is alert and oriented at baseline.    Assessment and Plan:  1. Moderate persistent asthma with exacerbation (Primary) Meds ordered this encounter  Medications   predniSONE (DELTASONE) 20 MG tablet    Sig: Take 1 tablet (20 mg total) by mouth 2 (two) times daily with a meal for 5 days.    Dispense:  10 tablet    Refill:  0   albuterol (VENTOLIN HFA) 108 (90 Base) MCG/ACT inhaler    Sig: Inhale 2 puffs into the lungs every 4 (four) hours as needed for wheezing or shortness of breath.    Dispense:  18 g    Refill:  1     Follow Up Instructions: I discussed the assessment and treatment plan with the patient. The patient was provided an opportunity to ask questions and all were answered. The patient agreed with the plan and demonstrated an understanding of the instructions.  A copy of instructions were sent to the patient via MyChart unless otherwise noted below.    The patient was advised to call back or seek an in-person evaluation if the symptoms worsen or if the condition fails to improve as anticipated.    Beverly Simas, FNP

## 2023-01-15 ENCOUNTER — Telehealth: Payer: Self-pay

## 2023-01-15 NOTE — Progress Notes (Unsigned)
   Care Guide Note  01/15/2023 Name: DEVANEE MAI MRN: [id number] DOB: 1985-05-19  Referred by: Corwin Levins, MD Reason for referral : Care Coordination (Outreach to schedule with Pharm d )   AIREL SCHIRRIPA is a 37 y.o. year old female who is a primary care patient of Corwin Levins, MD. TATANYA SOUTHERLY was referred to the pharmacist for assistance related to HTN.    An unsuccessful telephone outreach was attempted today to contact the patient who was referred to the pharmacy team for assistance with medication management. Additional attempts will be made to contact the patient.   Penne Lash , RMA     [facility]  [facility] Guide  Direct Dial: [phone]  Website: [email or web address]

## 2023-01-16 ENCOUNTER — Telehealth: Payer: Medicaid Other | Admitting: Physician Assistant

## 2023-01-16 DIAGNOSIS — Z79899 Other long term (current) drug therapy: Secondary | ICD-10-CM

## 2023-01-16 NOTE — Patient Instructions (Signed)
[name], thank you for joining Tylene Fantasia Ward, PA-C for today's virtual visit.  While this provider is not your primary care provider (PCP), if your PCP is located in our provider database this encounter information will be shared with them immediately following your visit.   A [facility] MyChart account gives you access to today's visit and all your visits, tests, and labs performed at [facility] " click here if you don't have a [facility] MyChart account or go to [email or web address]  Consent: (Patient) [name] provided verbal consent for this virtual visit at the beginning of the encounter.  Current Medications:  Current Outpatient Medications:    albuterol (VENTOLIN HFA) 108 (90 Base) MCG/ACT inhaler, Inhale 2 puffs into the lungs every 4 (four) hours as needed for wheezing or shortness of breath., Disp: 18 g, Rfl: 1   amLODipine (NORVASC) 5 MG tablet, Take 1 tablet (5 mg total) by mouth daily., Disp: 90 tablet, Rfl: 3   calcium carbonate (OS-CAL - DOSED IN MG OF ELEMENTAL CALCIUM) 1250 (500 Ca) MG tablet, Take 1 tablet (1,250 mg total) by mouth daily with breakfast., Disp: 30 tablet, Rfl: 0   cetirizine (ZYRTEC) 10 MG tablet, Take 1 tablet (10 mg total) by mouth daily., Disp: 90 tablet, Rfl: 1   clonazePAM (KLONOPIN) 1 MG tablet, Take 1 tablet (1 mg total) by mouth 2 (two) times daily as needed for anxiety., Disp: 60 tablet, Rfl: 2   dupilumab (DUPIXENT) 300 MG/2ML prefilled syringe, Inject 300 mg into the skin every 14 (fourteen) days., Disp: 4 mL, Rfl: 11   famotidine (PEPCID) 20 MG tablet, Take 1 tablet (20 mg total) by mouth 2 (two) times daily., Disp: 180 tablet, Rfl: 1   ferrous sulfate 325 (65 FE) MG tablet, Take 1 tablet (325 mg total) by mouth daily., Disp: 30 tablet, Rfl: 0   fluconazole (DIFLUCAN) 150 MG tablet, Take 1 tablet (150 mg total) by mouth every 3 (three) days as needed., Disp: 3 tablet, Rfl: 0   FLUoxetine (PROZAC) 20 MG capsule,  Take 1 capsule (20 mg total) by mouth daily., Disp: 90 capsule, Rfl: 3   fluticasone-salmeterol (ADVAIR HFA) 115-21 MCG/ACT inhaler, Inhale 2 puffs into the lungs 2 (two) times daily., Disp: 1 each, Rfl: 5   gabapentin (NEURONTIN) 300 MG capsule, Take 300 mg by mouth 3 (three) times daily., Disp: , Rfl:    Guaifenesin 1200 MG TB12, Take 1 tablet (1,200 mg total) by mouth every 12 (twelve) hours as needed., Disp: 30 tablet, Rfl: 0   hydrOXYzine (ATARAX) 50 MG tablet, Take 1 tablet (50 mg total) by mouth 2 (two) times daily as needed for anxiety., Disp: 60 tablet, Rfl: 1   ketoconazole (NIZORAL) 2 % cream, Apply 1 Application topically daily., Disp: 30 g, Rfl: 0   montelukast (SINGULAIR) 10 MG tablet, Take 1 tablet (10 mg total) by mouth at bedtime., Disp: 90 tablet, Rfl: 1   nicotine (NICODERM CQ - DOSED IN MG/24 HOURS) 14 mg/24hr patch, Place 1 patch (14 mg total) onto the skin daily., Disp: 28 patch, Rfl: 5   OLANZapine zydis (ZYPREXA) 5 MG disintegrating tablet, Take 1 tablet (5 mg total) by mouth at bedtime., Disp: 30 tablet, Rfl: 0   omeprazole (PRILOSEC) 40 MG capsule, Take 1 capsule (40 mg total) by mouth in the morning and at bedtime. Take 1 capsule (40 mg total) by mouth 2 (two) times daily., Disp: 180 capsule, Rfl: 1   OXcarbazepine (TRILEPTAL) 300 MG tablet,  Take 1 tablet (300 mg total) by mouth 2 (two) times daily., Disp: 60 tablet, Rfl: 1   paliperidone (INVEGA) 6 MG 24 hr tablet, Take 1 tablet (6 mg total) by mouth daily., Disp: 30 tablet, Rfl: 1   PORTIA-28 0.15-30 MG-MCG tablet, Take 1 tablet by mouth at bedtime., Disp: 84 tablet, Rfl: 1   prazosin (MINIPRESS) 1 MG capsule, Take 1 capsule (1 mg total) by mouth as directed. TAKE ONE IN THE AM AND 3 AT NIGHT (Patient not taking: Reported on 12/24/2022), Disp: 120 capsule, Rfl: 0   predniSONE (DELTASONE) 20 MG tablet, Take 1 tablet (20 mg total) by mouth 2 (two) times daily with a meal for 5 days., Disp: 10 tablet, Rfl: 0   Tiotropium  Bromide Monohydrate (SPIRIVA RESPIMAT) 1.25 MCG/ACT AERS, Inhale 2 puffs into the lungs daily., Disp: 4 g, Rfl: 5   traZODone (DESYREL) 50 MG tablet, Take 0.5 tablets (25 mg total) by mouth at bedtime as needed for sleep., Disp: 15 tablet, Rfl: 1   VENTOLIN HFA 108 (90 Base) MCG/ACT inhaler, Inhale 2 puffs into the lungs every 6 (six) hours as needed for wheezing or shortness of breath., Disp: 18 g, Rfl: 0   Medications ordered in this encounter:  No orders of the defined types were placed in this encounter.    *If you need refills on other medications prior to your next appointment, please contact your pharmacy*  Follow-Up: Call back or seek an in-person evaluation if the symptoms worsen or if the condition fails to improve as anticipated.  [facility] [phone]  Other Instructions Take prescription as written.  If symptoms persists please contact your Primary Care Physician or follow up with Urgent Care.    If you have been instructed to have an in-person evaluation today at a local Urgent Care facility, please use the link below. It will take you to a list of all of our available [facility], including address, phone number and hours of operation. Please do not delay care.  [facility]  If you or a family member do not have a primary care provider, use the link below to schedule a visit and establish care. When you choose a [facility] primary care physician or advanced practice provider, you gain a long-term partner in health. Find a Primary Care Provider  Learn more about [facility]'s in-office and virtual care options: [facility] - Get Care Now

## 2023-01-16 NOTE — Progress Notes (Signed)
Virtual Visit Consent   [name], you are scheduled for a virtual visit with a [facility] provider today. Just as with appointments in the office, your consent must be obtained to participate. Your consent will be active for this visit and any virtual visit you may have with one of our providers in the next 365 days. If you have a MyChart account, a copy of this consent can be sent to you electronically.  As this is a virtual visit, video technology does not allow for your provider to perform a traditional examination. This may limit your provider's ability to fully assess your condition. If your provider identifies any concerns that need to be evaluated in person or the need to arrange testing (such as labs, EKG, etc.), we will make arrangements to do so. Although advances in technology are sophisticated, we cannot ensure that it will always work on either your end or our end. If the connection with a video visit is poor, the visit may have to be switched to a telephone visit. With either a video or telephone visit, we are not always able to ensure that we have a secure connection.  By engaging in this virtual visit, you consent to the provision of healthcare and authorize for your insurance to be billed (if applicable) for the services provided during this visit. Depending on your insurance coverage, you may receive a charge related to this service.  I need to obtain your verbal consent now. Are you willing to proceed with your visit today? [name] has provided verbal consent on 01/16/2023 for a virtual visit (video or telephone). Tylene Fantasia Ward, PA-C  Date: 01/16/2023 7:52 PM  Virtual Visit via Video Note   I, Tylene Fantasia Ward, connected with  [name]  ([id number], 04-12-1985) on 01/16/23 at  7:45 PM EST by a video-enabled telemedicine application and verified that I am speaking with the correct person using two identifiers.  Location: Patient: Virtual Visit Location  Patient: Home Provider: Virtual Visit Location Provider: Home Office   I discussed the limitations of evaluation and management by telemedicine and the availability of in person appointments. The patient expressed understanding and agreed to proceed.    History of Present Illness: [name] is a 37 y.o. who identifies as a female who was assigned female at birth, and is being seen today for questions regarding her prednisone prescription.  She had a VV two days ago for asthma exacerbation.  Prescribed 40mg  prednisone daily.  She reports she typically gets a prednisone taper and was wanting to know if this was a prescription error.  HPI: HPI  Problems:  Patient Active Problem List   Diagnosis Date Noted   Panic 11/14/2022   Abscess of multiple sites 11/09/2022   Smoker 03/20/2022   HTN (hypertension) 03/20/2022   Vaginitis 03/20/2022   Suicide ideation 02/18/2022   Substance abuse (HCC) 02/18/2022   Bipolar 1 disorder, manic, full remission (HCC) 02/18/2022   Abscess of external cheek, left 01/01/2022   Rash 12/25/2021   Asthma exacerbation 11/26/2021   STD exposure 11/26/2021   Insect bite 11/26/2021   Bipolar affective disorder (HCC) 11/13/2021   Encounter for well adult exam with abnormal findings 11/12/2021   Cellulitis 11/12/2021   Vaginal discharge 08/26/2021   BRBPR (bright red blood per rectum) 02/27/2021   Anal fissure 02/27/2021   Facial abscess 02/12/2021   Folliculitis 12/21/2020   Bipolar 2 disorder, major depressive episode (HCC) 12/20/2020   Vitamin D  deficiency 07/15/2020   B12 deficiency 07/15/2020   MDD (major depressive disorder), recurrent severe, without psychosis (HCC) 04/26/2020   PTSD (post-traumatic stress disorder) 01/03/2020   Allergic asthma 01/13/2018   Allergic rhinitis 03/12/2017   Class 2 obesity due to excess calories without serious comorbidity with body mass index (BMI) of 37.0 to 37.9 in adult 04/17/2016   Acute asthmatic bronchitis  03/13/2016   Borderline personality disorder in adult [facility]) 03/13/2016   Mood disorder (HCC) 03/04/2013    Allergies:  Allergies  Allergen Reactions   Benztropine Other (See Comments)    agitation     Betadine [Povidone Iodine] Swelling   Other Other (See Comments)    Opiates- history of dependency    Lamictal [Lamotrigine] Rash   Medications:  Current Outpatient Medications:    albuterol (VENTOLIN HFA) 108 (90 Base) MCG/ACT inhaler, Inhale 2 puffs into the lungs every 4 (four) hours as needed for wheezing or shortness of breath., Disp: 18 g, Rfl: 1   amLODipine (NORVASC) 5 MG tablet, Take 1 tablet (5 mg total) by mouth daily., Disp: 90 tablet, Rfl: 3   calcium carbonate (OS-CAL - DOSED IN MG OF ELEMENTAL CALCIUM) 1250 (500 Ca) MG tablet, Take 1 tablet (1,250 mg total) by mouth daily with breakfast., Disp: 30 tablet, Rfl: 0   cetirizine (ZYRTEC) 10 MG tablet, Take 1 tablet (10 mg total) by mouth daily., Disp: 90 tablet, Rfl: 1   clonazePAM (KLONOPIN) 1 MG tablet, Take 1 tablet (1 mg total) by mouth 2 (two) times daily as needed for anxiety., Disp: 60 tablet, Rfl: 2   dupilumab (DUPIXENT) 300 MG/2ML prefilled syringe, Inject 300 mg into the skin every 14 (fourteen) days., Disp: 4 mL, Rfl: 11   famotidine (PEPCID) 20 MG tablet, Take 1 tablet (20 mg total) by mouth 2 (two) times daily., Disp: 180 tablet, Rfl: 1   ferrous sulfate 325 (65 FE) MG tablet, Take 1 tablet (325 mg total) by mouth daily., Disp: 30 tablet, Rfl: 0   fluconazole (DIFLUCAN) 150 MG tablet, Take 1 tablet (150 mg total) by mouth every 3 (three) days as needed., Disp: 3 tablet, Rfl: 0   FLUoxetine (PROZAC) 20 MG capsule, Take 1 capsule (20 mg total) by mouth daily., Disp: 90 capsule, Rfl: 3   fluticasone-salmeterol (ADVAIR HFA) 115-21 MCG/ACT inhaler, Inhale 2 puffs into the lungs 2 (two) times daily., Disp: 1 each, Rfl: 5   gabapentin (NEURONTIN) 300 MG capsule, Take 300 mg by mouth 3 (three) times daily., Disp: , Rfl:     Guaifenesin 1200 MG TB12, Take 1 tablet (1,200 mg total) by mouth every 12 (twelve) hours as needed., Disp: 30 tablet, Rfl: 0   hydrOXYzine (ATARAX) 50 MG tablet, Take 1 tablet (50 mg total) by mouth 2 (two) times daily as needed for anxiety., Disp: 60 tablet, Rfl: 1   ketoconazole (NIZORAL) 2 % cream, Apply 1 Application topically daily., Disp: 30 g, Rfl: 0   montelukast (SINGULAIR) 10 MG tablet, Take 1 tablet (10 mg total) by mouth at bedtime., Disp: 90 tablet, Rfl: 1   nicotine (NICODERM CQ - DOSED IN MG/24 HOURS) 14 mg/24hr patch, Place 1 patch (14 mg total) onto the skin daily., Disp: 28 patch, Rfl: 5   OLANZapine zydis (ZYPREXA) 5 MG disintegrating tablet, Take 1 tablet (5 mg total) by mouth at bedtime., Disp: 30 tablet, Rfl: 0   omeprazole (PRILOSEC) 40 MG capsule, Take 1 capsule (40 mg total) by mouth in the morning and at bedtime. Take 1  capsule (40 mg total) by mouth 2 (two) times daily., Disp: 180 capsule, Rfl: 1   OXcarbazepine (TRILEPTAL) 300 MG tablet, Take 1 tablet (300 mg total) by mouth 2 (two) times daily., Disp: 60 tablet, Rfl: 1   paliperidone (INVEGA) 6 MG 24 hr tablet, Take 1 tablet (6 mg total) by mouth daily., Disp: 30 tablet, Rfl: 1   PORTIA-28 0.15-30 MG-MCG tablet, Take 1 tablet by mouth at bedtime., Disp: 84 tablet, Rfl: 1   prazosin (MINIPRESS) 1 MG capsule, Take 1 capsule (1 mg total) by mouth as directed. TAKE ONE IN THE AM AND 3 AT NIGHT (Patient not taking: Reported on 12/24/2022), Disp: 120 capsule, Rfl: 0   predniSONE (DELTASONE) 20 MG tablet, Take 1 tablet (20 mg total) by mouth 2 (two) times daily with a meal for 5 days., Disp: 10 tablet, Rfl: 0   Tiotropium Bromide Monohydrate (SPIRIVA RESPIMAT) 1.25 MCG/ACT AERS, Inhale 2 puffs into the lungs daily., Disp: 4 g, Rfl: 5   traZODone (DESYREL) 50 MG tablet, Take 0.5 tablets (25 mg total) by mouth at bedtime as needed for sleep., Disp: 15 tablet, Rfl: 1   VENTOLIN HFA 108 (90 Base) MCG/ACT inhaler, Inhale 2 puffs into  the lungs every 6 (six) hours as needed for wheezing or shortness of breath., Disp: 18 g, Rfl: 0  Observations/Objective: Patient is well-developed, well-nourished in no acute distress.  Resting comfortably at home.  Head is normocephalic, atraumatic.  No labored breathing.  Speech is clear and coherent with logical content.  Patient is alert and oriented at baseline.   Assessment and Plan: There are no diagnoses linked to this encounter. Assured her the prescription is correct.    Follow Up Instructions: I discussed the assessment and treatment plan with the patient. The patient was provided an opportunity to ask questions and all were answered. The patient agreed with the plan and demonstrated an understanding of the instructions.  A copy of instructions were sent to the patient via MyChart unless otherwise noted below.     The patient was advised to call back or seek an in-person evaluation if the symptoms worsen or if the condition fails to improve as anticipated.    Tylene Fantasia Ward, PA-C

## 2023-01-18 ENCOUNTER — Other Ambulatory Visit: Payer: Self-pay

## 2023-01-20 ENCOUNTER — Other Ambulatory Visit: Payer: Self-pay

## 2023-01-20 NOTE — Progress Notes (Signed)
Care Guide Pharmacy Note  01/20/2023 Name: [name] MRN: [id number] DOB: 1985/02/20  Referred By: Corwin Levins, MD Reason for referral: Care Coordination (Outreach to schedule with Pharm d )   [name] is a 37 y.o. year old female who is a primary care patient of Corwin Levins, MD.  [name] was referred to the pharmacist for assistance related to: HTN and asthma  Successful contact was made with the patient to discuss pharmacy services including being ready for the pharmacist to call at least 5 minutes before the scheduled appointment time and to have medication bottles and any blood pressure readings ready for review. The patient agreed to meet with the pharmacist via telephone visit on (date/time).02/04/2023  Penne Lash , RMA     [place]  [facility] Guide  Direct Dial: [phone]  Website: [email or web address]

## 2023-01-21 ENCOUNTER — Other Ambulatory Visit (HOSPITAL_COMMUNITY): Payer: Self-pay

## 2023-01-21 ENCOUNTER — Other Ambulatory Visit: Payer: Self-pay

## 2023-01-21 NOTE — Progress Notes (Signed)
Specialty Pharmacy Refill Coordination Note  [name] is a 37 y.o. female contacted today regarding refills of specialty medication(s) Dupilumab (Dupixent)   Patient requested Delivery   Delivery date: 01/22/23   Verified address: [address], [place], [place] [address]   Medication will be filled on 01/21/23.

## 2023-02-02 ENCOUNTER — Other Ambulatory Visit: Payer: Self-pay

## 2023-02-02 ENCOUNTER — Ambulatory Visit (INDEPENDENT_AMBULATORY_CARE_PROVIDER_SITE_OTHER): Payer: Medicaid Other | Admitting: Allergy & Immunology

## 2023-02-02 ENCOUNTER — Encounter: Payer: Self-pay | Admitting: Allergy & Immunology

## 2023-02-02 DIAGNOSIS — F603 Borderline personality disorder: Secondary | ICD-10-CM

## 2023-02-02 DIAGNOSIS — B999 Unspecified infectious disease: Secondary | ICD-10-CM

## 2023-02-02 DIAGNOSIS — J4541 Moderate persistent asthma with (acute) exacerbation: Secondary | ICD-10-CM | POA: Diagnosis not present

## 2023-02-02 DIAGNOSIS — R490 Dysphonia: Secondary | ICD-10-CM | POA: Diagnosis not present

## 2023-02-02 DIAGNOSIS — K219 Gastro-esophageal reflux disease without esophagitis: Secondary | ICD-10-CM

## 2023-02-02 DIAGNOSIS — J3089 Other allergic rhinitis: Secondary | ICD-10-CM

## 2023-02-02 DIAGNOSIS — J302 Other seasonal allergic rhinitis: Secondary | ICD-10-CM

## 2023-02-02 MED ORDER — FAMOTIDINE 20 MG PO TABS: 20.0000 mg | ORAL_TABLET | Freq: Two times a day (BID) | ORAL | 1 refills | Status: DC

## 2023-02-02 MED ORDER — MONTELUKAST SODIUM 10 MG PO TABS: 10.0000 mg | ORAL_TABLET | Freq: Every day | ORAL | 1 refills | Status: DC

## 2023-02-02 MED ORDER — OMEPRAZOLE 40 MG PO CPDR: 40.0000 mg | DELAYED_RELEASE_CAPSULE | Freq: Two times a day (BID) | ORAL | 1 refills | Status: DC

## 2023-02-02 MED ORDER — SPIRIVA RESPIMAT 1.25 MCG/ACT IN AERS: 2.0000 | INHALATION_SPRAY | Freq: Every day | RESPIRATORY_TRACT | 1 refills | Status: DC

## 2023-02-02 MED ORDER — ALBUTEROL SULFATE HFA 108 (90 BASE) MCG/ACT IN AERS: 2.0000 | INHALATION_SPRAY | RESPIRATORY_TRACT | 1 refills | Status: DC | PRN

## 2023-02-02 NOTE — Progress Notes (Signed)
 RE: [name] MRN: [id number] DOB: 1985-07-22 Date of Telemedicine Visit: 02/02/2023  Referring provider: Norleen Lynwood ORN, MD Primary care provider: Norleen Lynwood ORN, MD  Chief Complaint: Allergies (Would like to start allergy  injections, get refills on medication and possibly needs refills on dupixent . Would also like to get a prescribed nebulizer.)   Telemedicine Follow Up Visit via Telephone: I connected with [name] for a follow up on 02/04/23 by telephone and verified that I am speaking with the correct person using two identifiers.   I discussed the limitations, risks, security and privacy concerns of performing an evaluation and management service by telephone and the availability of in person appointments. I also discussed with the patient that there may be a patient responsible charge related to this service. The patient expressed understanding and agreed to proceed.  Patient is at home..  Provider is at the office.  Visit start time: 3:39 PM Visit end time: 3:59 PM Insurance consent/check in by: [facility] consent and medical assistant/nurse:Hope  History of Present Illness:  She is a 37 y.o. female, who is being followed for asthma as well as allergic rhinitis, GERD, and eczema. Her previous allergy  office visit was in September 2024 with myself.  She was last seen in September 2024.  At that time, lung testing looked great.  We got her back on Dupixent  and gave her a sample of Breztri .  We also gave her a prednisone  burst.  We continue with Singulair  10 mg daily.  For her allergic rhinitis, we continue with hydroxyzine  as well as Singulair  and Flonase .  Eczema was controlled with hydrocortisone .  GERD was not under good control.  We continue with omeprazole  40 mg twice daily and added on famotidine  20 mg twice daily.  Since the last visit, she has mostly done well.   Asthma/Respiratory Symptom History: She remains on the Dupixent  and she feels that they are  helping. She is having trouble with walking up stairs.  She is on Advair two puffs twice daily and the Spiriva . She will intermittently miss these. She was talking to her Medicaid Case Worker. She no longer has her nebulizer. She is interested in a refill of this. But she had one ten years but it was lost a coupe of years ago when she moved. She has been receiving asthma injections, which have provided some relief, but the patient still requires frequent use of her rescue inhaler. The patient is compliant with her current medication regimen, which includes Advair and Spiriva , although she admits to occasionally missing a dose.  Allergic Rhinitis Symptom History: The patient has expressed interest in restarting allergy  shots, which she had previously received before the COVID-19 pandemic. She has access to transportation for medical appointments through [facility]. She has requested that her allergy  shot formula include horse allergens, as she has a desire to participate in horseback riding activities.  She did some horseback riding with her mother and she would like to continue to pursue this. She is open to getting them at the [facility] office since she is living in [facility] right now. She is renting a room in a house with multiple other rooms that are rented to individuals. So she enjoys the social aspect of where she is living now.   Otherwise, there have been no changes to her past medical history, surgical history, family history, or social history.  Assessment and Plan:  [name] is a 37 y.o. female with:  Moderate persistent asthma without complication -  doing a little bit better on the Dupixent  (administered at home)   AEC 400 in September 2024    Seasonal and perennial allergic rhinitis (dust mites, cats, dogs, cockroach, trees, weeds, grass, mouse, and horse) - interested in restarting allergen immunotherapy at the [facility] office   Boderline personality disorder - with worsening status  since breaking up with her significant other of 13 years   Recurrent infections - has not gotten labs from the last visit done   We are going to go ahead and get shots restarted. She is going to get them at the [facility] office since this is closer to where she is living now. We did add on horse into her allergen immunotherapy to help her tolerate this exposure more effectively. We are going to continue with the Dupixent  as well as her inhalers. She does report fairly good compliance with this.   Diagnostics: None.  Medication List:  Current Outpatient Medications  Medication Sig Dispense Refill   amLODipine  (NORVASC ) 5 MG tablet Take 1 tablet (5 mg total) by mouth daily. 90 tablet 3   calcium  carbonate (OS-CAL - DOSED IN MG OF ELEMENTAL CALCIUM ) 1250 (500 Ca) MG tablet Take 1 tablet (1,250 mg total) by mouth daily with breakfast. 30 tablet 0   cetirizine  (ZYRTEC ) 10 MG tablet Take 1 tablet (10 mg total) by mouth daily. 90 tablet 1   clonazePAM  (KLONOPIN ) 1 MG tablet Take 1 tablet (1 mg total) by mouth 2 (two) times daily as needed for anxiety. 60 tablet 2   dupilumab  (DUPIXENT ) 300 MG/2ML prefilled syringe Inject 300 mg into the skin every 14 (fourteen) days. 4 mL 11   famotidine  (PEPCID ) 20 MG tablet Take 20 mg by mouth 2 (two) times daily.     ferrous sulfate  325 (65 FE) MG tablet Take 1 tablet (325 mg total) by mouth daily. 30 tablet 0   FLUoxetine  (PROZAC ) 20 MG capsule Take 1 capsule (20 mg total) by mouth daily. 90 capsule 3   fluticasone  (FLONASE ) 50 MCG/ACT nasal spray Place 2 sprays into both nostrils daily.     fluticasone -salmeterol (ADVAIR HFA) 115-21 MCG/ACT inhaler Inhale 2 puffs into the lungs 2 (two) times daily. 1 each 5   gabapentin  (NEURONTIN ) 300 MG capsule Take 300 mg by mouth 3 (three) times daily.     Guaifenesin  1200 MG TB12 Take 1 tablet (1,200 mg total) by mouth every 12 (twelve) hours as needed. 30 tablet 0   hydrOXYzine  (ATARAX ) 50 MG tablet Take 1 tablet (50 mg  total) by mouth 2 (two) times daily as needed for anxiety. 60 tablet 1   montelukast  (SINGULAIR ) 10 MG tablet Take 10 mg by mouth at bedtime.     Multiple Vitamin (MULTIVITAMIN) tablet Take 1 tablet by mouth daily. Women's Daily Vitamin     nicotine  (NICODERM CQ  - DOSED IN MG/24 HOURS) 14 mg/24hr patch Place 1 patch (14 mg total) onto the skin daily. 28 patch 5   OLANZapine  zydis (ZYPREXA ) 5 MG disintegrating tablet Take 1 tablet (5 mg total) by mouth at bedtime. 30 tablet 0   omeprazole  (PRILOSEC) 40 MG capsule Take 40 mg by mouth in the morning and at bedtime.     OXcarbazepine  (TRILEPTAL ) 300 MG tablet Take 1 tablet (300 mg total) by mouth 2 (two) times daily. 60 tablet 1   paliperidone  (INVEGA ) 6 MG 24 hr tablet Take 1 tablet (6 mg total) by mouth daily. 30 tablet 1   Potassium 99 MG TABS Take  99 mg by mouth daily.     prazosin  (MINIPRESS ) 1 MG capsule Take 1 capsule (1 mg total) by mouth as directed. TAKE ONE IN THE AM AND 3 AT NIGHT 120 capsule 0   thiamine  (VITAMIN B1) 100 MG tablet Take 100 mg by mouth daily.     traZODone  (DESYREL ) 50 MG tablet Take 0.5 tablets (25 mg total) by mouth at bedtime as needed for sleep. 15 tablet 1   VENTOLIN  HFA 108 (90 Base) MCG/ACT inhaler Inhale 2 puffs into the lungs every 6 (six) hours as needed for wheezing or shortness of breath. 18 g 0   Zinc  50 MG TABS Take 50 mg by mouth daily.     albuterol  (VENTOLIN  HFA) 108 (90 Base) MCG/ACT inhaler Inhale 2 puffs into the lungs every 4 (four) hours as needed for wheezing or shortness of breath. 18 g 1   famotidine  (PEPCID ) 20 MG tablet Take 1 tablet (20 mg total) by mouth 2 (two) times daily. 180 tablet 1   fluconazole  (DIFLUCAN ) 150 MG tablet Take 1 tablet (150 mg total) by mouth every 3 (three) days as needed. (Patient not taking: Reported on 02/02/2023) 3 tablet 0   ketoconazole  (NIZORAL ) 2 % cream Apply 1 Application topically daily. (Patient not taking: Reported on 02/02/2023) 30 g 0   montelukast   (SINGULAIR ) 10 MG tablet Take 1 tablet (10 mg total) by mouth at bedtime. 90 tablet 1   omeprazole  (PRILOSEC) 40 MG capsule Take 1 capsule (40 mg total) by mouth in the morning and at bedtime. Take 1 capsule (40 mg total) by mouth 2 (two) times daily. 180 capsule 1   PORTIA-28 0.15-30 MG-MCG tablet Take 1 tablet by mouth at bedtime. (Patient not taking: Reported on 02/02/2023) 84 tablet 1   Tiotropium Bromide Monohydrate  (SPIRIVA  RESPIMAT) 1.25 MCG/ACT AERS Inhale 2 puffs into the lungs daily. 12 g 1   No current facility-administered medications for this visit.   Allergies: Allergies  Allergen Reactions   Benztropine Other (See Comments)    agitation     Betadine [Povidone Iodine] Swelling   Other Other (See Comments)    Opiates- history of dependency    Lamictal [Lamotrigine] Rash   I reviewed her past medical history, social history, family history, and environmental history and no significant changes have been reported from previous visits.  Review of Systems  Constitutional:  Negative for activity change and appetite change.  HENT:  Negative for congestion, postnasal drip, rhinorrhea, sinus pressure and sore throat.   Eyes:  Negative for pain, discharge, redness and itching.  Respiratory:  Positive for shortness of breath. Negative for wheezing and stridor.   Gastrointestinal:  Negative for diarrhea, nausea and vomiting.  Musculoskeletal:  Negative for arthralgias, joint swelling and myalgias.  Skin:  Negative for rash.  Allergic/Immunologic: Positive for environmental allergies. Negative for food allergies.    Objective:  Physical exam not obtained as encounter was done via telephone.   Previous notes and tests were reviewed.  I discussed the assessment and treatment plan with the patient. The patient was provided an opportunity to ask questions and all were answered. The patient agreed with the plan and demonstrated an understanding of the instructions.   The patient  was advised to call back or seek an in-person evaluation if the symptoms worsen or if the condition fails to improve as anticipated.  I provided 20 minutes of non-face-to-face time during this encounter.  It was my pleasure to participate in McKay Brandel's care today.  Please feel free to contact me with any questions or concerns.   Sincerely,  Marty Morton Shaggy, MD

## 2023-02-04 ENCOUNTER — Other Ambulatory Visit: Payer: Medicaid Other | Admitting: Pharmacist

## 2023-02-04 ENCOUNTER — Encounter: Payer: Self-pay | Admitting: Allergy & Immunology

## 2023-02-04 DIAGNOSIS — I10 Essential (primary) hypertension: Secondary | ICD-10-CM

## 2023-02-04 DIAGNOSIS — J4541 Moderate persistent asthma with (acute) exacerbation: Secondary | ICD-10-CM

## 2023-02-04 DIAGNOSIS — J3081 Allergic rhinitis due to animal (cat) (dog) hair and dander: Secondary | ICD-10-CM | POA: Diagnosis not present

## 2023-02-04 MED ORDER — SPACER/AERO-HOLDING CHAMBERS DEVI: 0 refills | Status: DC

## 2023-02-04 MED ORDER — BLOOD PRESSURE CUFF MISC: 1.0000 | Freq: Every day | 0 refills | Status: AC | PRN

## 2023-02-04 NOTE — Progress Notes (Signed)
 Aeroallergen Immunotherapy   Ordering Provider: Dr. Marty Shaggy   Patient Details  Name: [name]  MRN: [id number]  Date of Birth: 02-07-1985   Order 1 of 2   Vial Label: G/W/T/C/D   0.3 ml (Volume)  BAU Concentration -- 7 Grass Mix* 100,000 ([place], Lucien, [place]escue, Perennial [place], RedTop, Sweet Vernal, Timothy)  0.3 ml (Volume)  BAU Concentration -- [place] 10,000  0.5 ml (Volume)  1:20 Concentration -- Weed Mix*  0.5 ml (Volume)  1:20 Concentration -- Eastern 10 Tree Mix (also Sweet Gum)  0.2 ml (Volume)  1:20 Concentration -- Box Elder  0.2 ml (Volume)  1:10 Concentration -- Pecan Pollen  0.5 ml (Volume)  1:10 Concentration -- Cat Hair  0.5 ml (Volume)  1:10 Concentration -- Dog Epithelia  0.3 ml (Volume)  1:10 Concentration -- Horse Epithelia    3.3  ml Extract Subtotal  1.7  ml Diluent   5.0  ml Maintenance Total   Schedule:  B   Blue Vial (1:100,000): Schedule B (6 doses)  Yellow Vial (1:10,000): Schedule B (6 doses)  Green Vial (1:1,000): Schedule B (6 doses)  Red Vial (1:100): Schedule A (14 doses)   Special Instructions: After completion of the first Red Vial, please space to every two weeks. After completion of the second Red Vial, please space to every 4 weeks. Ok to up dose new vials at 0.48mL --> 0.3 mL --> 0.5 mL. Ok to come twice weekly, if desired, as long as there is 48 hours between injections.

## 2023-02-04 NOTE — Progress Notes (Signed)
 Aeroallergen Immunotherapy   Ordering Provider: Dr. Marty Shaggy   Patient Details  Name: FELICIE KOCHER  MRN: [id number]  Date of Birth: 19-Oct-1985   Order 2 of 2   Order 1 of 2   Vial Label: Molds/DM/RW   0.3 ml (Volume)  1:20 Concentration -- Ragweed Mix  0.2 ml (Volume)  1:20 Concentration -- Cladosporium herbarum  0.2 ml (Volume)  1:10 Concentration -- Penicillium mix  0.7 ml (Volume)   AU Concentration -- Mite Mix (DF 5,000 & DP 5,000)    1.4  ml Extract Subtotal  3.6  ml Diluent   5.0  ml Maintenance Total   Schedule:  B   Blue Vial (1:100,000): Schedule B (6 doses)  Yellow Vial (1:10,000): Schedule B (6 doses)  Green Vial (1:1,000): Schedule B (6 doses)  Red Vial (1:100): Schedule A (14 doses)   Special Instructions: After completion of the first Red Vial, please space to every two weeks. After completion of the second Red Vial, please space to every 4 weeks. Ok to up dose new vials at 0.21mL --> 0.3 mL --> 0.5 mL. Ok to come twice weekly, if desired, as long as there is 48 hours between injections.

## 2023-02-04 NOTE — Progress Notes (Signed)
 VIALS EXP 02-04-24

## 2023-02-04 NOTE — Progress Notes (Signed)
   02/04/2023 Name: [name] MRN: [id number] DOB: 1985/03/13  Chief Complaint  Patient presents with   Hypertension   Asthma    [name] is a 38 y.o. year old female who presented for a telephone visit.   They were referred to the pharmacist byTelehealth NP for assistance in managing medication access.    Subjective:  Care Team: Primary Care Provider: Norleen Lynwood ORN, MD ; Next Scheduled Visit: not scheduled   Medication Access/Adherence  Current Pharmacy:  [facility] [place], [place] - [address] [place] [address] Phone: [phone] Fax: [phone]  [facility] LONG - [facility] [address] [place] [address] Phone: [phone] Fax: [phone]  [id number] [place], [place] - [address] AT [facility] [address] [place] [address] Phone: [phone] Fax: [phone]  [facility] #[id number] - [place], [place] - [address] AT [facility] [address] [place] [address] Phone: [phone] Fax: [phone]   Patient reports affordability concerns with their medications: No  Patient reports access/transportation concerns to their pharmacy: Yes  Patient reports adherence concerns with their medications:  No    Pt requests help getting a BP cuff since she is on 2 BP meds and wants to make sure her BP is not getting too low.  Pt also mentions she asked about getting a nebulizer machine at recent Asthma/Allergy  appt however did not get instructions on how to get it. She is also asking for a spacer for her inhalers.   Objective:   Assessment/Plan:   Able to get BP cuff for $0 through Medicaid, must be through a DME pharmacy. Can send to [facility].  Will send message to Dr. Iva regarding nebulizer order. I can send order for spacer  Follow Up Plan: PRN  Darrelyn Drum, PharmD, BCPS, CPP Clinical Pharmacist Practitioner [facility] [phone]

## 2023-02-05 ENCOUNTER — Telehealth: Payer: Self-pay

## 2023-02-05 DIAGNOSIS — J3089 Other allergic rhinitis: Secondary | ICD-10-CM | POA: Diagnosis not present

## 2023-02-05 MED ORDER — ALBUTEROL SULFATE (2.5 MG/3ML) 0.083% IN NEBU: INHALATION_SOLUTION | RESPIRATORY_TRACT | 2 refills | Status: DC

## 2023-02-05 MED ORDER — NEBULIZER/TUBING/MOUTHPIECE KIT: PACK | 0 refills | Status: DC

## 2023-02-05 NOTE — Telephone Encounter (Signed)
-----   Message from Alfonse Spruce sent at 02/05/2023  4:56 PM EST ----- YES! Sorry I forgot to do that. Asthma/Allergy crew - can you send in a script for albuterol neb machine and nebulizer solution? Thanks!

## 2023-02-09 MED ORDER — ALBUTEROL SULFATE (2.5 MG/3ML) 0.083% IN NEBU: INHALATION_SOLUTION | RESPIRATORY_TRACT | 2 refills | Status: DC

## 2023-02-09 MED ORDER — NEBULIZER/TUBING/MOUTHPIECE KIT: PACK | 0 refills | Status: AC

## 2023-02-09 NOTE — Telephone Encounter (Addendum)
 Nebulizer kit and neb medication prescriptions were sent to [facility] on 02/05/23.  I will send a new prescription for the neb kit and neb medication to [facility] now.

## 2023-02-09 NOTE — Addendum Note (Signed)
 Addended by: Areta Haber B on: 02/09/2023 05:53 PM   Modules accepted: Orders

## 2023-02-09 NOTE — Telephone Encounter (Signed)
 Patient called stating that she is going to pick up the nebulizer tomorrow at [facility] and that she has a ride set up through Medicaid to go pick up the nebulizer tomorrow. She states that when she called [facility] today they didn't not have the prescription, were you familiar with this?

## 2023-02-10 ENCOUNTER — Other Ambulatory Visit: Payer: Self-pay | Admitting: Allergy & Immunology

## 2023-02-10 ENCOUNTER — Other Ambulatory Visit: Payer: Self-pay

## 2023-02-10 NOTE — Progress Notes (Signed)
 Specialty Pharmacy Refill Coordination Note  [name] is a 38 y.o. female contacted today regarding refills of specialty medication(s) Dupilumab  (Dupixent )   Patient requested Delivery   Delivery date: 02/18/23   Verified address: [address] [place] [address]   Medication will be filled on 02/17/23.   *Bill to [facility] account if copay is not covered through card on file.*

## 2023-02-11 ENCOUNTER — Other Ambulatory Visit: Payer: Self-pay | Admitting: Allergy & Immunology

## 2023-02-11 MED ORDER — PREDNISONE 10 MG PO TABS: ORAL_TABLET | ORAL | 0 refills | Status: DC

## 2023-02-11 NOTE — Telephone Encounter (Signed)
 Patient called stating she had an appointment with Dr. Iva on 12/31 and was suppose to get prednisone  sent to her pharmacy. Patient states she was just able to go to the pharmacy to pick up the medication and it was never sent in. Patient needs prednisone  sent to [facility] on [address] in [place].

## 2023-02-11 NOTE — Telephone Encounter (Signed)
 I guess she was SOB a bit. I sent in a low dose taper. Can you call the patient to let her know?

## 2023-02-23 ENCOUNTER — Ambulatory Visit (INDEPENDENT_AMBULATORY_CARE_PROVIDER_SITE_OTHER): Payer: Medicaid Other

## 2023-02-23 DIAGNOSIS — J309 Allergic rhinitis, unspecified: Secondary | ICD-10-CM

## 2023-02-23 MED ORDER — EPINEPHRINE 0.3 MG/0.3ML IJ SOAJ: 0.3000 mg | Freq: Once | INTRAMUSCULAR | 0 refills | Status: AC

## 2023-02-23 NOTE — Progress Notes (Signed)
Immunotherapy   Patient Details  Name: [name] MRN: [id number] Date of Birth: 03/21/1985  02/23/2023  [name] started allergy injections today, blue vials waited 30 min in office without any issues. Following schedule: B  Frequency:1 time per week Epi-Pen:Epi-Pen Available   Consent signed and patient instructions given.   Florence Canner 02/23/2023, 3:25 PM

## 2023-02-24 ENCOUNTER — Encounter: Payer: Self-pay | Admitting: Allergy & Immunology

## 2023-02-24 ENCOUNTER — Other Ambulatory Visit: Payer: Self-pay

## 2023-02-24 ENCOUNTER — Ambulatory Visit (INDEPENDENT_AMBULATORY_CARE_PROVIDER_SITE_OTHER): Payer: Medicaid Other | Admitting: Allergy & Immunology

## 2023-02-24 DIAGNOSIS — K219 Gastro-esophageal reflux disease without esophagitis: Secondary | ICD-10-CM | POA: Diagnosis not present

## 2023-02-24 DIAGNOSIS — J4541 Moderate persistent asthma with (acute) exacerbation: Secondary | ICD-10-CM | POA: Diagnosis not present

## 2023-02-24 DIAGNOSIS — J302 Other seasonal allergic rhinitis: Secondary | ICD-10-CM

## 2023-02-24 DIAGNOSIS — B999 Unspecified infectious disease: Secondary | ICD-10-CM | POA: Diagnosis not present

## 2023-02-24 DIAGNOSIS — J3089 Other allergic rhinitis: Secondary | ICD-10-CM

## 2023-02-24 NOTE — Progress Notes (Signed)
RE: JAVERIA LENIS MRN: [id number] DOB: 06-26-1985 Date of Telemedicine Visit: 02/24/2023  Referring provider: Corwin Levins, MD Primary care provider: Corwin Levins, MD  Chief Complaint: Allergic Rhinitis  (Would like an updated allergy test )   Telemedicine Follow Up Visit via Telephone: I connected with Evelena Peat for a follow up on 02/24/23 by telephone and verified that I am speaking with the correct person using two identifiers.   I discussed the limitations, risks, security and privacy concerns of performing an evaluation and management service by telephone and the availability of in person appointments. I also discussed with the patient that there may be a patient responsible charge related to this service. The patient expressed understanding and agreed to proceed.  Patient is at home.  Provider is at the office.  Visit start time: 11:00 AM Visit end time: 11:20 AM Insurance consent/check in by: [facility] consent and medical assistant/nurse: Diandra  History of Present Illness:  She is a 38 y.o. female, who is being followed for allergic rhinitis as well as persistent asthma. Her previous allergy office visit was in December 2024 with myself.  We last saw her in December 2024.  At that time, via televisit, she decided to get her allergy shots restarted.  She was living in [facility], so we decided to get them in the [facility] office.  We continue with her Dupixent as well as her inhalers.  Verity, with a known history of multiple allergies, contacted the clinic with concerns about her allergy shots. She was unsure if all her allergens were included in the shots, as she remembered being allergic to more substances than what was currently being addressed. Specifically, she recalled being allergic to more animals, including rats and mice, which she had as pets in the past.  Joumana is on allergen immunotherapy. She receives two injections. Immunotherapy script #1 contains  ragweed, molds, and dust mites. She currently receives 0.83mL of the BLUE vial (1/100,000). Immunotherapy script #2 contains horse, trees, weeds, grasses, cat, and dog. She currently receives 0.24mL of the BLUE vial (1/100,000). She started shots January of 2025 and not yet reached maintenance. We did confirm her previous testing including blood work and skin testing. 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 She had been told previously that this was not an option, but she was interested in exploring this possibility further.  Lucianne Lei also reported ongoing issues with severe heartburn, despite being on a regimen of omeprazole and famotidine twice a day. The severity and persistence of her symptoms raised concerns about potential underlying issues. She has never seen GI and is open to a referral.   Lastly, Lucianne Lei mentioned that she was due to administer her Dupixent injection, which she had postponed by two days following advice from the injection room technician. She planned to administer the injection the following day.  Otherwise, there have been no changes to her past medical history, surgical history, family history, or social history.  Assessment and Plan:  Hyland is a 38 y.o. female with:  Moderate persistent asthma without complication - doing a little bit better on the Dupixent (administered at home)   AEC 400 in September 2024    Seasonal and  perennial allergic rhinitis (dust mites, cats, dogs, cockroach, trees, weeds, grass, mouse, and horse) - interested in restarting allergen immunotherapy at the [facility] office   Boderline personality disorder - with worsening status since breaking up with her significant other of 13 years   Recurrent  infections - has not gotten labs from the last visit done   1. Moderate persistent asthma, uncomplicated - Lung testing looked good today. - We are going to get Dupixent back on board. - Breztri sample provided today. - Prednisone burst started today.  - Daily controller medication(s): Singulair 10mg  daily and Breztri two puffs twice daily with spacer - Prior to physical activity: albuterol 2 puffs 10-15 minutes before physical activity. - Rescue medications: albuterol 4 puffs every 4-6 hours as needed - Asthma control goals:  * Full participation in all desired activities (may need albuterol before activity) * Albuterol use two time or less a week on average (not counting use with activity) * Cough interfering with sleep two time or less a month * Oral steroids no more than once a year * No hospitalizations  2. Seasonal and perennial allergic rhinitis (dust mites, cats, dogs, cockroach, trees, weeds, grass, mouse, and horse) - We did confirm that she had testing that was positive to mice in 2019 before I met her, but this is difficult to find for allergen immunotherapy and really is not a problem if you are not around them chronically.  - Continue with: hydroxyzine one tablet twice daily, Singulair (montelukast) 10mg  daily, Flonase (fluticasone) two sprays per nostril daily - You can use an extra dose of the antihistamine, if needed, for breakthrough symptoms.  - Continue with nasal saline rinses as needed.    3. Eczema - Continue with the hydrocortisone as needed.   4. GERD  - Continue with omeprazole 40mg  BID. - Continue with famotidine 20mg  twice daily as well.  - We are going to refer you to see GI for evaluation fo something more severe that might be making the reflux a lot worse.   5. Follow up as scheduled.   Diagnostics: None.  Medication List:  Current Outpatient Medications  Medication Sig Dispense Refill   albuterol (PROVENTIL) (2.5 MG/3ML) 0.083% nebulizer  solution Inhale 1 vial (2.5 mg) via nebulizer every 4-6 as needed for cough, wheeze, shortness of breath and chest tightness. 75 mL 2   albuterol (VENTOLIN HFA) 108 (90 Base) MCG/ACT inhaler Inhale 2 puffs into the lungs every 4 (four) hours as needed for wheezing or shortness of breath. 18 g 1   amLODipine (NORVASC) 5 MG tablet Take 1 tablet (5 mg total) by mouth daily. 90 tablet 3   Blood Pressure Monitoring (BLOOD PRESSURE CUFF) MISC 1 Device by Does not apply route daily as needed. 1 each 0   calcium carbonate (OS-CAL - DOSED IN MG OF ELEMENTAL CALCIUM) 1250 (500 Ca) MG tablet Take 1 tablet (1,250 mg total) by mouth daily with breakfast. 30 tablet 0   cetirizine (ZYRTEC) 10 MG tablet Take 1 tablet (10 mg total) by mouth daily. 90 tablet 1   clonazePAM (KLONOPIN) 1 MG tablet Take 1 tablet (1 mg total) by mouth 2 (two) times daily as needed for anxiety. 60 tablet 2   dupilumab (DUPIXENT) 300 MG/2ML prefilled syringe Inject 300 mg into the skin every 14 (fourteen) days. 4 mL 11   famotidine (PEPCID) 20 MG tablet Take 1 tablet (20 mg total) by mouth 2 (two) times daily. 180 tablet 1   famotidine (PEPCID)  20 MG tablet Take 20 mg by mouth 2 (two) times daily.     ferrous sulfate 325 (65 FE) MG tablet Take 1 tablet (325 mg total) by mouth daily. 30 tablet 0   fluconazole (DIFLUCAN) 150 MG tablet Take 1 tablet (150 mg total) by mouth every 3 (three) days as needed. 3 tablet 0   FLUoxetine (PROZAC) 20 MG capsule Take 1 capsule (20 mg total) by mouth daily. 90 capsule 3   fluticasone (FLONASE) 50 MCG/ACT nasal spray Place 2 sprays into both nostrils daily.     fluticasone-salmeterol (ADVAIR HFA) 115-21 MCG/ACT inhaler Inhale 2 puffs into the lungs 2 (two) times daily. 1 each 5   gabapentin (NEURONTIN) 300 MG capsule Take 300 mg by mouth 3 (three) times daily.     Guaifenesin 1200 MG TB12 Take 1 tablet (1,200 mg total) by mouth every 12 (twelve) hours as needed. 30 tablet 0   hydrOXYzine (ATARAX) 50 MG  tablet Take 1 tablet (50 mg total) by mouth 2 (two) times daily as needed for anxiety. 60 tablet 1   montelukast (SINGULAIR) 10 MG tablet Take 1 tablet (10 mg total) by mouth at bedtime. 90 tablet 1   montelukast (SINGULAIR) 10 MG tablet Take 10 mg by mouth at bedtime.     Multiple Vitamin (MULTIVITAMIN) tablet Take 1 tablet by mouth daily. Women's Daily Vitamin     nicotine (NICODERM CQ - DOSED IN MG/24 HOURS) 14 mg/24hr patch Place 1 patch (14 mg total) onto the skin daily. 28 patch 5   OLANZapine zydis (ZYPREXA) 5 MG disintegrating tablet Take 1 tablet (5 mg total) by mouth at bedtime. 30 tablet 0   omeprazole (PRILOSEC) 40 MG capsule Take 1 capsule (40 mg total) by mouth in the morning and at bedtime. Take 1 capsule (40 mg total) by mouth 2 (two) times daily. 180 capsule 1   omeprazole (PRILOSEC) 40 MG capsule Take 40 mg by mouth in the morning and at bedtime.     OXcarbazepine (TRILEPTAL) 300 MG tablet Take 1 tablet (300 mg total) by mouth 2 (two) times daily. 60 tablet 1   paliperidone (INVEGA) 6 MG 24 hr tablet Take 1 tablet (6 mg total) by mouth daily. 30 tablet 1   PORTIA-28 0.15-30 MG-MCG tablet Take 1 tablet by mouth at bedtime. 84 tablet 1   Potassium 99 MG TABS Take 99 mg by mouth daily.     prazosin (MINIPRESS) 1 MG capsule Take 1 capsule (1 mg total) by mouth as directed. TAKE ONE IN THE AM AND 3 AT NIGHT 120 capsule 0   predniSONE (DELTASONE) 10 MG tablet Take two tablets (20mg ) twice daily for three days, then one tablet (10mg ) twice daily for three days, then STOP. 18 tablet 0   Respiratory Therapy Supplies (NEBULIZER/TUBING/MOUTHPIECE) KIT Inhale 1 vial via nebulizer every 4-6 hours as needed for cough, wheeze, shortness of breath and chest tighness. 1 kit 0   thiamine (VITAMIN B1) 100 MG tablet Take 100 mg by mouth daily.     Tiotropium Bromide Monohydrate (SPIRIVA RESPIMAT) 1.25 MCG/ACT AERS Inhale 2 puffs into the lungs daily. 12 g 1   traZODone (DESYREL) 50 MG tablet Take 0.5  tablets (25 mg total) by mouth at bedtime as needed for sleep. 15 tablet 1   VENTOLIN HFA 108 (90 Base) MCG/ACT inhaler Inhale 2 puffs into the lungs every 6 (six) hours as needed for wheezing or shortness of breath. 18 g 0   Zinc 50 MG TABS Take  50 mg by mouth daily.     ketoconazole (NIZORAL) 2 % cream Apply 1 Application topically daily. (Patient not taking: Reported on 02/24/2023) 30 g 0   Spacer/Aero-Holding Rudean Curt To use as needed with inhalers (Patient not taking: Reported on 02/24/2023) 1 each 0   No current facility-administered medications for this visit.   Allergies: Allergies  Allergen Reactions   Benztropine Other (See Comments)    agitation     Betadine [Povidone Iodine] Swelling   Other Other (See Comments)    Opiates- history of dependency    Lamictal [Lamotrigine] Rash   I reviewed her past medical history, social history, family history, and environmental history and no significant changes have been reported from previous visits.  Review of Systems  Constitutional:  Negative for activity change and appetite change.  HENT:  Negative for congestion, postnasal drip, rhinorrhea, sinus pressure and sore throat.   Eyes:  Negative for pain, discharge, redness and itching.  Respiratory:  Negative for shortness of breath, wheezing and stridor.   Gastrointestinal:  Positive for abdominal pain and nausea. Negative for diarrhea and vomiting.  Musculoskeletal:  Negative for arthralgias, joint swelling and myalgias.  Skin:  Negative for rash.  Allergic/Immunologic: Positive for environmental allergies. Negative for food allergies.    Objective:  Physical exam not obtained as encounter was done via telephone.   Previous notes and tests were reviewed.  I discussed the assessment and treatment plan with the patient. The patient was provided an opportunity to ask questions and all were answered. The patient agreed with the plan and demonstrated an understanding of the  instructions.   The patient was advised to call back or seek an in-person evaluation if the symptoms worsen or if the condition fails to improve as anticipated.  I provided 20 minutes of non-face-to-face time during this encounter.  It was my pleasure to participate in [place] Hirth's care today. Please feel free to contact me with any questions or concerns.   Sincerely,  Alfonse Spruce, MD

## 2023-02-24 NOTE — Patient Instructions (Addendum)
1. Moderate persistent asthma, uncomplicated - Lung testing looked good today. - We are going to get Dupixent back on board. - Breztri sample provided today. - Prednisone burst started today.  - Daily controller medication(s): Singulair 10mg  daily and Breztri two puffs twice daily with spacer - Prior to physical activity: albuterol 2 puffs 10-15 minutes before physical activity. - Rescue medications: albuterol 4 puffs every 4-6 hours as needed - Asthma control goals:  * Full participation in all desired activities (may need albuterol before activity) * Albuterol use two time or less a week on average (not counting use with activity) * Cough interfering with sleep two time or less a month * Oral steroids no more than once a year * No hospitalizations  2. Seasonal and perennial allergic rhinitis (dust mites, cats, dogs, cockroach, trees, weeds, grass, mouse, and horse) - We did confirm that she had testing that was positive to mice in 2019 before I met her, but this is difficult to find for allergen immunotherapy and really is not a problem if you are not around them chronically.  - Continue with: hydroxyzine one tablet twice daily, Singulair (montelukast) 10mg  daily, Flonase (fluticasone) two sprays per nostril daily - You can use an extra dose of the antihistamine, if needed, for breakthrough symptoms.  - Continue with nasal saline rinses as needed.    3. Eczema - Continue with the hydrocortisone as needed.   4. GERD  - Continue with omeprazole 40mg  BID. - Continue with famotidine 20mg  twice daily as well.  - We are going to refer you to see GI for evaluation fo something more severe that might be making the reflux a lot worse.   5. Follow up as scheduled.    Please inform us of any Emergency Department visits, hospitalizations, or changes in symptoms. Call us before going to the ED for breathing or allergy symptoms since we might be able to fit you in for a sick visit. Feel free to  contact us anytime with any questions, problems, or concerns.  It was a pleasure to talk to you today today!  Websites that have reliable patient information: 1. American Academy of Asthma, Allergy, and Immunology: www.aaaai.org 2. Food Allergy Research and Education (FARE): foodallergy.org 3. Mothers of Asthmatics: http://www.asthmacommunitynetwork.org 4. American College of Allergy, Asthma, and Immunology: www.acaai.org      "Like" [place] on Facebook and Instagram for our latest updates!      A healthy democracy works best when [employer] participate! Make sure you are registered to vote! If you have moved or changed any of your contact information, you will need to get this updated before voting! Scan the QR codes below to learn more!

## 2023-02-26 ENCOUNTER — Telehealth: Payer: Self-pay | Admitting: *Deleted

## 2023-02-26 NOTE — Telephone Encounter (Signed)
Pt called and said her Epipen needed a PA. She plans to pick up on Tuesday. We will call pharmacy and work on this on Monday- she was good with that.

## 2023-03-02 MED ORDER — EPINEPHRINE 0.3 MG/0.3ML IJ SOAJ
0.3000 mg | INTRAMUSCULAR | 2 refills | Status: AC | PRN
Start: 2023-03-02 — End: ?

## 2023-03-10 ENCOUNTER — Other Ambulatory Visit: Payer: Self-pay

## 2023-03-12 ENCOUNTER — Other Ambulatory Visit (HOSPITAL_COMMUNITY): Payer: Self-pay

## 2023-03-15 ENCOUNTER — Other Ambulatory Visit: Payer: Self-pay

## 2023-03-15 NOTE — Progress Notes (Signed)
 Specialty Pharmacy Refill Coordination Note  [name] is a 38 y.o. female contacted today regarding refills of specialty medication(s) Dupilumab  (Dupixent )   Patient requested Delivery   Delivery date: 03/16/23   Verified address: [address] [place], [place] [address]   Medication will be filled on 02.10.25.

## 2023-03-23 ENCOUNTER — Other Ambulatory Visit: Payer: Self-pay

## 2023-03-23 ENCOUNTER — Other Ambulatory Visit: Payer: Self-pay | Admitting: Internal Medicine

## 2023-03-30 ENCOUNTER — Other Ambulatory Visit: Payer: Self-pay

## 2023-04-12 ENCOUNTER — Other Ambulatory Visit (HOSPITAL_COMMUNITY): Payer: Self-pay

## 2023-04-14 ENCOUNTER — Telehealth: Admitting: Family Medicine

## 2023-04-14 DIAGNOSIS — Z789 Other specified health status: Secondary | ICD-10-CM

## 2023-04-14 DIAGNOSIS — L089 Local infection of the skin and subcutaneous tissue, unspecified: Secondary | ICD-10-CM

## 2023-04-14 DIAGNOSIS — B9689 Other specified bacterial agents as the cause of diseases classified elsewhere: Secondary | ICD-10-CM

## 2023-04-14 MED ORDER — SULFAMETHOXAZOLE-TRIMETHOPRIM 800-160 MG PO TABS: 1.0000 | ORAL_TABLET | Freq: Two times a day (BID) | ORAL | 0 refills | Status: AC

## 2023-04-14 NOTE — Patient Instructions (Signed)

## 2023-04-14 NOTE — Progress Notes (Signed)
 Virtual Visit Consent   [name], you are scheduled for a virtual visit with a [facility] provider today. Just as with appointments in the office, your consent must be obtained to participate. Your consent will be active for this visit and any virtual visit you may have with one of our providers in the next 365 days. If you have a MyChart account, a copy of this consent can be sent to you electronically.  As this is a virtual visit, video technology does not allow for your provider to perform a traditional examination. This may limit your provider's ability to fully assess your condition. If your provider identifies any concerns that need to be evaluated in person or the need to arrange testing (such as labs, EKG, etc.), we will make arrangements to do so. Although advances in technology are sophisticated, we cannot ensure that it will always work on either your end or our end. If the connection with a video visit is poor, the visit may have to be switched to a telephone visit. With either a video or telephone visit, we are not always able to ensure that we have a secure connection.  By engaging in this virtual visit, you consent to the provision of healthcare and authorize for your insurance to be billed (if applicable) for the services provided during this visit. Depending on your insurance coverage, you may receive a charge related to this service.  I need to obtain your verbal consent now. Are you willing to proceed with your visit today? [name] has provided verbal consent on 04/14/2023 for a virtual visit (video or telephone). Georgana Curio, FNP  Date: 04/14/2023 6:21 PM   Virtual Visit via Video Note   I, Georgana Curio, connected with  [name]  ([id number], 03-27-1985) on 04/14/23 at  6:15 PM EDT by a video-enabled telemedicine application and verified that I am speaking with the correct person using two identifiers.  Location: Patient: Virtual Visit Location Patient:  Home Provider: Virtual Visit Location Provider: Home Office   I discussed the limitations of evaluation and management by telemedicine and the availability of in person appointments. The patient expressed understanding and agreed to proceed.    History of Present Illness: [name] is a 38 y.o. who identifies as a female who was assigned female at birth, and is being seen today for infected piercing left cheek. Mild redness, drainage and warmth. No fever. [name]  HPI: HPI  Problems:  Patient Active Problem List   Diagnosis Date Noted   Panic 11/14/2022   Abscess of multiple sites 11/09/2022   Smoker 03/20/2022   HTN (hypertension) 03/20/2022   Vaginitis 03/20/2022   Suicide ideation 02/18/2022   Substance abuse (HCC) 02/18/2022   Bipolar 1 disorder, manic, full remission (HCC) 02/18/2022   Abscess of external cheek, left 01/01/2022   Rash 12/25/2021   Asthma exacerbation 11/26/2021   STD exposure 11/26/2021   Insect bite 11/26/2021   Bipolar affective disorder (HCC) 11/13/2021   Encounter for well adult exam with abnormal findings 11/12/2021   Cellulitis 11/12/2021   Vaginal discharge 08/26/2021   BRBPR (bright red blood per rectum) 02/27/2021   Anal fissure 02/27/2021   Facial abscess 02/12/2021   Folliculitis 12/21/2020   Bipolar 2 disorder, major depressive episode (HCC) 12/20/2020   Vitamin D deficiency 07/15/2020   B12 deficiency 07/15/2020   MDD (major depressive disorder), recurrent severe, without psychosis (HCC) 04/26/2020   PTSD (post-traumatic stress disorder) 01/03/2020   Allergic asthma  01/13/2018   Allergic rhinitis 03/12/2017   Class 2 obesity due to excess calories without serious comorbidity with body mass index (BMI) of 37.0 to 37.9 in adult 04/17/2016   Acute asthmatic bronchitis 03/13/2016   Borderline personality disorder in adult [facility]) 03/13/2016   Mood disorder (HCC) 03/04/2013    Allergies:  Allergies  Allergen Reactions   Benztropine Other  (See Comments)    agitation     Betadine [Povidone Iodine] Swelling   Other Other (See Comments)    Opiates- history of dependency    Lamictal [Lamotrigine] Rash   Medications:  Current Outpatient Medications:    amLODipine (NORVASC) 5 MG tablet, Take 1 tablet (5 mg total) by mouth daily., Disp: 90 tablet, Rfl: 3   sulfamethoxazole-trimethoprim (BACTRIM DS) 800-160 MG tablet, Take 1 tablet by mouth 2 (two) times daily for 14 days., Disp: 28 tablet, Rfl: 0   albuterol (PROVENTIL) (2.5 MG/3ML) 0.083% nebulizer solution, Inhale 1 vial (2.5 mg) via nebulizer every 4-6 as needed for cough, wheeze, shortness of breath and chest tightness., Disp: 75 mL, Rfl: 2   albuterol (VENTOLIN HFA) 108 (90 Base) MCG/ACT inhaler, Inhale 2 puffs into the lungs every 4 (four) hours as needed for wheezing or shortness of breath., Disp: 18 g, Rfl: 1   Blood Pressure Monitoring (BLOOD PRESSURE CUFF) MISC, 1 Device by Does not apply route daily as needed., Disp: 1 each, Rfl: 0   calcium carbonate (OS-CAL - DOSED IN MG OF ELEMENTAL CALCIUM) 1250 (500 Ca) MG tablet, Take 1 tablet (1,250 mg total) by mouth daily with breakfast., Disp: 30 tablet, Rfl: 0   cetirizine (ZYRTEC) 10 MG tablet, Take 1 tablet (10 mg total) by mouth daily., Disp: 90 tablet, Rfl: 1   clonazePAM (KLONOPIN) 1 MG tablet, Take 1 tablet (1 mg total) by mouth 2 (two) times daily as needed for anxiety., Disp: 60 tablet, Rfl: 2   dupilumab (DUPIXENT) 300 MG/2ML prefilled syringe, Inject 300 mg into the skin every 14 (fourteen) days., Disp: 4 mL, Rfl: 11   EPINEPHrine (EPIPEN 2-PAK) 0.3 mg/0.3 mL IJ SOAJ injection, Inject 0.3 mg into the muscle as needed for anaphylaxis., Disp: 2 each, Rfl: 2   famotidine (PEPCID) 20 MG tablet, Take 1 tablet (20 mg total) by mouth 2 (two) times daily., Disp: 180 tablet, Rfl: 1   famotidine (PEPCID) 20 MG tablet, Take 20 mg by mouth 2 (two) times daily., Disp: , Rfl:    ferrous sulfate 325 (65 FE) MG tablet, Take 1 tablet  (325 mg total) by mouth daily., Disp: 30 tablet, Rfl: 0   FLUoxetine (PROZAC) 20 MG capsule, Take 1 capsule (20 mg total) by mouth daily., Disp: 90 capsule, Rfl: 3   fluticasone (FLONASE) 50 MCG/ACT nasal spray, Place 2 sprays into both nostrils daily., Disp: , Rfl:    fluticasone-salmeterol (ADVAIR HFA) 115-21 MCG/ACT inhaler, Inhale 2 puffs into the lungs 2 (two) times daily., Disp: 1 each, Rfl: 5   gabapentin (NEURONTIN) 300 MG capsule, Take 300 mg by mouth 3 (three) times daily., Disp: , Rfl:    Guaifenesin 1200 MG TB12, Take 1 tablet (1,200 mg total) by mouth every 12 (twelve) hours as needed., Disp: 30 tablet, Rfl: 0   hydrOXYzine (ATARAX) 50 MG tablet, Take 1 tablet (50 mg total) by mouth 2 (two) times daily as needed for anxiety., Disp: 60 tablet, Rfl: 1   montelukast (SINGULAIR) 10 MG tablet, Take 1 tablet (10 mg total) by mouth at bedtime., Disp: 90 tablet, Rfl: 1  montelukast (SINGULAIR) 10 MG tablet, Take 10 mg by mouth at bedtime., Disp: , Rfl:    Multiple Vitamin (MULTIVITAMIN) tablet, Take 1 tablet by mouth daily. Women's Daily Vitamin, Disp: , Rfl:    nicotine (NICODERM CQ - DOSED IN MG/24 HOURS) 14 mg/24hr patch, Place 1 patch (14 mg total) onto the skin daily., Disp: 28 patch, Rfl: 5   OLANZapine zydis (ZYPREXA) 5 MG disintegrating tablet, Take 1 tablet (5 mg total) by mouth at bedtime., Disp: 30 tablet, Rfl: 0   omeprazole (PRILOSEC) 40 MG capsule, Take 1 capsule (40 mg total) by mouth in the morning and at bedtime. Take 1 capsule (40 mg total) by mouth 2 (two) times daily., Disp: 180 capsule, Rfl: 1   omeprazole (PRILOSEC) 40 MG capsule, Take 40 mg by mouth in the morning and at bedtime., Disp: , Rfl:    OXcarbazepine (TRILEPTAL) 300 MG tablet, Take 1 tablet (300 mg total) by mouth 2 (two) times daily., Disp: 60 tablet, Rfl: 1   paliperidone (INVEGA) 6 MG 24 hr tablet, Take 1 tablet (6 mg total) by mouth daily., Disp: 30 tablet, Rfl: 1   PORTIA-28 0.15-30 MG-MCG tablet, Take 1  tablet by mouth at bedtime., Disp: 84 tablet, Rfl: 1   Potassium 99 MG TABS, Take 99 mg by mouth daily., Disp: , Rfl:    prazosin (MINIPRESS) 1 MG capsule, Take 1 capsule (1 mg total) by mouth as directed. TAKE ONE IN THE AM AND 3 AT NIGHT, Disp: 120 capsule, Rfl: 0   Respiratory Therapy Supplies (NEBULIZER/TUBING/MOUTHPIECE) KIT, Inhale 1 vial via nebulizer every 4-6 hours as needed for cough, wheeze, shortness of breath and chest tighness., Disp: 1 kit, Rfl: 0   thiamine (VITAMIN B1) 100 MG tablet, Take 100 mg by mouth daily., Disp: , Rfl:    Tiotropium Bromide Monohydrate (SPIRIVA RESPIMAT) 1.25 MCG/ACT AERS, Inhale 2 puffs into the lungs daily., Disp: 12 g, Rfl: 1   traZODone (DESYREL) 50 MG tablet, Take 0.5 tablets (25 mg total) by mouth at bedtime as needed for sleep., Disp: 15 tablet, Rfl: 1   VENTOLIN HFA 108 (90 Base) MCG/ACT inhaler, Inhale 2 puffs into the lungs every 6 (six) hours as needed for wheezing or shortness of breath., Disp: 18 g, Rfl: 0   Zinc 50 MG TABS, Take 50 mg by mouth daily., Disp: , Rfl:   Observations/Objective: Patient is well-developed, well-nourished in no acute distress.  Resting comfortably  at home.  Head is normocephalic, atraumatic.  No labored breathing.  Speech is clear and coherent with logical content.  Patient is alert and oriented at baseline.   Assessment and Plan: 1. Bacterial skin infection (Primary)  2. Body piercing  Keep clean and dry, UC if sx worsen.   Follow Up Instructions: I discussed the assessment and treatment plan with the patient. The patient was provided an opportunity to ask questions and all were answered. The patient agreed with the plan and demonstrated an understanding of the instructions.  A copy of instructions were sent to the patient via MyChart unless otherwise noted below.     The patient was advised to call back or seek an in-person evaluation if the symptoms worsen or if the condition fails to improve as  anticipated.    Georgana Curio, FNP

## 2023-04-19 ENCOUNTER — Other Ambulatory Visit: Payer: Self-pay

## 2023-05-02 ENCOUNTER — Telehealth

## 2023-05-06 ENCOUNTER — Other Ambulatory Visit (HOSPITAL_COMMUNITY): Payer: Self-pay

## 2023-05-06 NOTE — Progress Notes (Signed)
 Clinical Intervention Note  Clinical Intervention Notes: Patient has been sick and off of therapy for 2 months.  She is now well and called to ask if she needed to redo her loading doses before restarting. I advised her that it is not generally recommended to re-load unless she has been off of therapy for 6 months.  I advised her to start back on her regular every 14 day schedule.  She was understanding and will restart. All questions were answered.   Clinical Intervention Outcomes: Improved therapy adherence   Servando Snare Specialty Pharmacist

## 2023-05-07 ENCOUNTER — Telehealth: Admitting: Physician Assistant

## 2023-05-07 DIAGNOSIS — L0201 Cutaneous abscess of face: Secondary | ICD-10-CM | POA: Diagnosis not present

## 2023-05-07 DIAGNOSIS — T3695XA Adverse effect of unspecified systemic antibiotic, initial encounter: Secondary | ICD-10-CM

## 2023-05-07 DIAGNOSIS — B379 Candidiasis, unspecified: Secondary | ICD-10-CM | POA: Diagnosis not present

## 2023-05-07 MED ORDER — FLUCONAZOLE 150 MG PO TABS: 150.0000 mg | ORAL_TABLET | ORAL | 0 refills | Status: DC | PRN

## 2023-05-07 MED ORDER — AMOXICILLIN-POT CLAVULANATE 875-125 MG PO TABS: 1.0000 | ORAL_TABLET | Freq: Two times a day (BID) | ORAL | 0 refills | Status: DC

## 2023-05-07 NOTE — Progress Notes (Signed)
 Virtual Visit Consent   [name], you are scheduled for a virtual visit with a [facility] provider today. Just as with appointments in the office, your consent must be obtained to participate. Your consent will be active for this visit and any virtual visit you may have with one of our providers in the next 365 days. If you have a MyChart account, a copy of this consent can be sent to you electronically.  As this is a virtual visit, video technology does not allow for your provider to perform a traditional examination. This may limit your provider's ability to fully assess your condition. If your provider identifies any concerns that need to be evaluated in person or the need to arrange testing (such as labs, EKG, etc.), we will make arrangements to do so. Although advances in technology are sophisticated, we cannot ensure that it will always work on either your end or our end. If the connection with a video visit is poor, the visit may have to be switched to a telephone visit. With either a video or telephone visit, we are not always able to ensure that we have a secure connection.  By engaging in this virtual visit, you consent to the provision of healthcare and authorize for your insurance to be billed (if applicable) for the services provided during this visit. Depending on your insurance coverage, you may receive a charge related to this service.  I need to obtain your verbal consent now. Are you willing to proceed with your visit today? [name] has provided verbal consent on 05/07/2023 for a virtual visit (video or telephone). [name], PA-C  Date: 05/07/2023 3:16 PM   Virtual Visit via Video Note   I, [name], connected with  [name]  ([id number], [birth year]) on 05/07/23 at  3:15 PM EDT by a video-enabled telemedicine application and verified that I am speaking with the correct person using two identifiers.  Location: Patient: Virtual Visit  Location Patient: Home Provider: Virtual Visit Location Provider: Home Office   I discussed the limitations of evaluation and management by telemedicine and the availability of in person appointments. The patient expressed understanding and agreed to proceed.    History of Present Illness: [name] is a 38 y.o. who identifies as a female who was assigned female at birth, and is being seen today for infected piercing, left cheek. Was last seen virtually on 04/14/23 and given Bactrim for 14 days, completed 04/28/23. Had improved from infection but has not completely cleared. Had this happen in 12/2021 and took 2 rounds of 10 day Augmentin to clear. Reports Doxycycline is not effective for her. Does have a h/o MRSA on her chart.    Problems:  Patient Active Problem List   Diagnosis Date Noted   Panic 11/14/2022   Abscess of multiple sites 11/09/2022   Smoker 03/20/2022   HTN (hypertension) 03/20/2022   Vaginitis 03/20/2022   Suicide ideation 02/18/2022   Substance abuse (HCC) 02/18/2022   Bipolar 1 disorder, manic, full remission (HCC) 02/18/2022   Abscess of external cheek, left 01/01/2022   Rash 12/25/2021   Asthma exacerbation 11/26/2021   STD exposure 11/26/2021   Insect bite 11/26/2021   Bipolar affective disorder (HCC) 11/13/2021   Encounter for well adult exam with abnormal findings 11/12/2021   Cellulitis 11/12/2021   Vaginal discharge 08/26/2021   BRBPR (bright red blood per rectum) 02/27/2021   Anal fissure 02/27/2021   Facial abscess 02/12/2021  Folliculitis 12/21/2020   Bipolar 2 disorder, major depressive episode (HCC) 12/20/2020   Vitamin D deficiency 07/15/2020   B12 deficiency 07/15/2020   MDD (major depressive disorder), recurrent severe, without psychosis (HCC) 04/26/2020   PTSD (post-traumatic stress disorder) 01/03/2020   Allergic asthma 01/13/2018   Allergic rhinitis 03/12/2017   Class 2 obesity due to excess calories without serious comorbidity with  body mass index (BMI) of 37.0 to 37.9 in adult 04/17/2016   Acute asthmatic bronchitis 03/13/2016   Borderline personality disorder in adult [facility]) 03/13/2016   Mood disorder (HCC) 03/04/2013    Allergies:  Allergies  Allergen Reactions   Benztropine Other (See Comments)    agitation     Betadine [Povidone Iodine] Swelling   Other Other (See Comments)    Opiates- history of dependency    Lamictal [Lamotrigine] Rash   Medications:  Current Outpatient Medications:    amLODipine (NORVASC) 5 MG tablet, Take 1 tablet (5 mg total) by mouth daily., Disp: 90 tablet, Rfl: 3   amoxicillin-clavulanate (AUGMENTIN) 875-125 MG tablet, Take 1 tablet by mouth 2 (two) times daily., Disp: 20 tablet, Rfl: 0   albuterol (PROVENTIL) (2.5 MG/3ML) 0.083% nebulizer solution, Inhale 1 vial (2.5 mg) via nebulizer every 4-6 as needed for cough, wheeze, shortness of breath and chest tightness., Disp: 75 mL, Rfl: 2   albuterol (VENTOLIN HFA) 108 (90 Base) MCG/ACT inhaler, Inhale 2 puffs into the lungs every 4 (four) hours as needed for wheezing or shortness of breath., Disp: 18 g, Rfl: 1   Blood Pressure Monitoring (BLOOD PRESSURE CUFF) MISC, 1 Device by Does not apply route daily as needed., Disp: 1 each, Rfl: 0   calcium carbonate (OS-CAL - DOSED IN MG OF ELEMENTAL CALCIUM) 1250 (500 Ca) MG tablet, Take 1 tablet (1,250 mg total) by mouth daily with breakfast., Disp: 30 tablet, Rfl: 0   cetirizine (ZYRTEC) 10 MG tablet, Take 1 tablet (10 mg total) by mouth daily., Disp: 90 tablet, Rfl: 1   clonazePAM (KLONOPIN) 1 MG tablet, Take 1 tablet (1 mg total) by mouth 2 (two) times daily as needed for anxiety., Disp: 60 tablet, Rfl: 2   dupilumab (DUPIXENT) 300 MG/2ML prefilled syringe, Inject 300 mg into the skin every 14 (fourteen) days., Disp: 4 mL, Rfl: 11   EPINEPHrine (EPIPEN 2-PAK) 0.3 mg/0.3 mL IJ SOAJ injection, Inject 0.3 mg into the muscle as needed for anaphylaxis., Disp: 2 each, Rfl: 2   famotidine (PEPCID) 20  MG tablet, Take 1 tablet (20 mg total) by mouth 2 (two) times daily., Disp: 180 tablet, Rfl: 1   famotidine (PEPCID) 20 MG tablet, Take 20 mg by mouth 2 (two) times daily., Disp: , Rfl:    ferrous sulfate 325 (65 FE) MG tablet, Take 1 tablet (325 mg total) by mouth daily., Disp: 30 tablet, Rfl: 0   FLUoxetine (PROZAC) 20 MG capsule, Take 1 capsule (20 mg total) by mouth daily., Disp: 90 capsule, Rfl: 3   fluticasone (FLONASE) 50 MCG/ACT nasal spray, Place 2 sprays into both nostrils daily., Disp: , Rfl:    fluticasone-salmeterol (ADVAIR HFA) 115-21 MCG/ACT inhaler, Inhale 2 puffs into the lungs 2 (two) times daily., Disp: 1 each, Rfl: 5   gabapentin (NEURONTIN) 300 MG capsule, Take 300 mg by mouth 3 (three) times daily., Disp: , Rfl:    Guaifenesin 1200 MG TB12, Take 1 tablet (1,200 mg total) by mouth every 12 (twelve) hours as needed., Disp: 30 tablet, Rfl: 0   hydrOXYzine (ATARAX) 50 MG tablet, Take  1 tablet (50 mg total) by mouth 2 (two) times daily as needed for anxiety., Disp: 60 tablet, Rfl: 1   montelukast (SINGULAIR) 10 MG tablet, Take 1 tablet (10 mg total) by mouth at bedtime., Disp: 90 tablet, Rfl: 1   montelukast (SINGULAIR) 10 MG tablet, Take 10 mg by mouth at bedtime., Disp: , Rfl:    Multiple Vitamin (MULTIVITAMIN) tablet, Take 1 tablet by mouth daily. Women's Daily Vitamin, Disp: , Rfl:    nicotine (NICODERM CQ - DOSED IN MG/24 HOURS) 14 mg/24hr patch, Place 1 patch (14 mg total) onto the skin daily., Disp: 28 patch, Rfl: 5   OLANZapine zydis (ZYPREXA) 5 MG disintegrating tablet, Take 1 tablet (5 mg total) by mouth at bedtime., Disp: 30 tablet, Rfl: 0   omeprazole (PRILOSEC) 40 MG capsule, Take 1 capsule (40 mg total) by mouth in the morning and at bedtime. Take 1 capsule (40 mg total) by mouth 2 (two) times daily., Disp: 180 capsule, Rfl: 1   omeprazole (PRILOSEC) 40 MG capsule, Take 40 mg by mouth in the morning and at bedtime., Disp: , Rfl:    OXcarbazepine (TRILEPTAL) 300 MG  tablet, Take 1 tablet (300 mg total) by mouth 2 (two) times daily., Disp: 60 tablet, Rfl: 1   paliperidone (INVEGA) 6 MG 24 hr tablet, Take 1 tablet (6 mg total) by mouth daily., Disp: 30 tablet, Rfl: 1   PORTIA-28 0.15-30 MG-MCG tablet, Take 1 tablet by mouth at bedtime., Disp: 84 tablet, Rfl: 1   Potassium 99 MG TABS, Take 99 mg by mouth daily., Disp: , Rfl:    prazosin (MINIPRESS) 1 MG capsule, Take 1 capsule (1 mg total) by mouth as directed. TAKE ONE IN THE AM AND 3 AT NIGHT, Disp: 120 capsule, Rfl: 0   Respiratory Therapy Supplies (NEBULIZER/TUBING/MOUTHPIECE) KIT, Inhale 1 vial via nebulizer every 4-6 hours as needed for cough, wheeze, shortness of breath and chest tighness., Disp: 1 kit, Rfl: 0   thiamine (VITAMIN B1) 100 MG tablet, Take 100 mg by mouth daily., Disp: , Rfl:    Tiotropium Bromide Monohydrate (SPIRIVA RESPIMAT) 1.25 MCG/ACT AERS, Inhale 2 puffs into the lungs daily., Disp: 12 g, Rfl: 1   traZODone (DESYREL) 50 MG tablet, Take 0.5 tablets (25 mg total) by mouth at bedtime as needed for sleep., Disp: 15 tablet, Rfl: 1   VENTOLIN HFA 108 (90 Base) MCG/ACT inhaler, Inhale 2 puffs into the lungs every 6 (six) hours as needed for wheezing or shortness of breath., Disp: 18 g, Rfl: 0   Zinc 50 MG TABS, Take 50 mg by mouth daily., Disp: , Rfl:   Observations/Objective: Patient is well-developed, well-nourished in no acute distress.  Resting comfortably at home.  Head is normocephalic, atraumatic.  No labored breathing.  Speech is clear and coherent with logical content.  Patient is alert and oriented at baseline.    Assessment and Plan: 1. Abscess of left external cheek (Primary) - amoxicillin-clavulanate (AUGMENTIN) 875-125 MG tablet; Take 1 tablet by mouth 2 (two) times daily.  Dispense: 20 tablet; Refill: 0  - Change treatment from Bactrim to Augmentin (this has worked in the past) - Continue cleaning piercing - If continually remains infected advised to be seen in person  to have a wound culture obtained - Also if continues to have redness and swelling she may have to remove the piercing and let heal, then consider to have pierced again once completely healed - Follow up in person if not improving  Follow Up Instructions:  I discussed the assessment and treatment plan with the patient. The patient was provided an opportunity to ask questions and all were answered. The patient agreed with the plan and demonstrated an understanding of the instructions.  A copy of instructions were sent to the patient via MyChart unless otherwise noted below.    The patient was advised to call back or seek an in-person evaluation if the symptoms worsen or if the condition fails to improve as anticipated.    [name], PA-C

## 2023-05-07 NOTE — Patient Instructions (Addendum)
 [name], thank you for joining Beverly Loveless, PA-C for today's virtual visit.  While this provider is not your primary care provider (PCP), if your PCP is located in our provider database this encounter information will be shared with them immediately following your visit.   A [name] account gives you access to today's visit and all your visits, tests, and labs performed at [name] " click here if you don't have a [name] account or go to [name]  Consent: (Patient) [name] provided verbal consent for this virtual visit at the beginning of the encounter.  Current Medications:  Current Outpatient Medications:    amLODipine (NORVASC) 5 MG tablet, Take 1 tablet (5 mg total) by mouth daily., Disp: 90 tablet, Rfl: 3   amoxicillin-clavulanate (AUGMENTIN) 875-125 MG tablet, Take 1 tablet by mouth 2 (two) times daily., Disp: 20 tablet, Rfl: 0   fluconazole (DIFLUCAN) 150 MG tablet, Take 1 tablet (150 mg total) by mouth every 3 (three) days as needed., Disp: 3 tablet, Rfl: 0   albuterol (PROVENTIL) (2.5 MG/3ML) 0.083% nebulizer solution, Inhale 1 vial (2.5 mg) via nebulizer every 4-6 as needed for cough, wheeze, shortness of breath and chest tightness., Disp: 75 mL, Rfl: 2   albuterol (VENTOLIN HFA) 108 (90 Base) MCG/ACT inhaler, Inhale 2 puffs into the lungs every 4 (four) hours as needed for wheezing or shortness of breath., Disp: 18 g, Rfl: 1   Blood Pressure Monitoring (BLOOD PRESSURE CUFF) MISC, 1 Device by Does not apply route daily as needed., Disp: 1 each, Rfl: 0   calcium carbonate (OS-CAL - DOSED IN MG OF ELEMENTAL CALCIUM) 1250 (500 Ca) MG tablet, Take 1 tablet (1,250 mg total) by mouth daily with breakfast., Disp: 30 tablet, Rfl: 0   cetirizine (ZYRTEC) 10 MG tablet, Take 1 tablet (10 mg total) by mouth daily., Disp: 90 tablet, Rfl: 1   clonazePAM (KLONOPIN) 1 MG tablet, Take 1 tablet (1 mg total) by mouth 2 (two)  times daily as needed for anxiety., Disp: 60 tablet, Rfl: 2   dupilumab (DUPIXENT) 300 MG/2ML prefilled syringe, Inject 300 mg into the skin every 14 (fourteen) days., Disp: 4 mL, Rfl: 11   EPINEPHrine (EPIPEN 2-PAK) 0.3 mg/0.3 mL IJ SOAJ injection, Inject 0.3 mg into the muscle as needed for anaphylaxis., Disp: 2 each, Rfl: 2   famotidine (PEPCID) 20 MG tablet, Take 1 tablet (20 mg total) by mouth 2 (two) times daily., Disp: 180 tablet, Rfl: 1   famotidine (PEPCID) 20 MG tablet, Take 20 mg by mouth 2 (two) times daily., Disp: , Rfl:    ferrous sulfate 325 (65 FE) MG tablet, Take 1 tablet (325 mg total) by mouth daily., Disp: 30 tablet, Rfl: 0   FLUoxetine (PROZAC) 20 MG capsule, Take 1 capsule (20 mg total) by mouth daily., Disp: 90 capsule, Rfl: 3   fluticasone (FLONASE) 50 MCG/ACT nasal spray, Place 2 sprays into both nostrils daily., Disp: , Rfl:    fluticasone-salmeterol (ADVAIR HFA) 115-21 MCG/ACT inhaler, Inhale 2 puffs into the lungs 2 (two) times daily., Disp: 1 each, Rfl: 5   gabapentin (NEURONTIN) 300 MG capsule, Take 300 mg by mouth 3 (three) times daily., Disp: , Rfl:    Guaifenesin 1200 MG TB12, Take 1 tablet (1,200 mg total) by mouth every 12 (twelve) hours as needed., Disp: 30 tablet, Rfl: 0   hydrOXYzine (ATARAX) 50 MG tablet, Take 1 tablet (50 mg total) by mouth 2 (two) times daily as  needed for anxiety., Disp: 60 tablet, Rfl: 1   montelukast (SINGULAIR) 10 MG tablet, Take 1 tablet (10 mg total) by mouth at bedtime., Disp: 90 tablet, Rfl: 1   montelukast (SINGULAIR) 10 MG tablet, Take 10 mg by mouth at bedtime., Disp: , Rfl:    Multiple Vitamin (MULTIVITAMIN) tablet, Take 1 tablet by mouth daily. Women's Daily Vitamin, Disp: , Rfl:    nicotine (NICODERM CQ - DOSED IN MG/24 HOURS) 14 mg/24hr patch, Place 1 patch (14 mg total) onto the skin daily., Disp: 28 patch, Rfl: 5   OLANZapine zydis (ZYPREXA) 5 MG disintegrating tablet, Take 1 tablet (5 mg total) by mouth at bedtime., Disp: 30  tablet, Rfl: 0   omeprazole (PRILOSEC) 40 MG capsule, Take 1 capsule (40 mg total) by mouth in the morning and at bedtime. Take 1 capsule (40 mg total) by mouth 2 (two) times daily., Disp: 180 capsule, Rfl: 1   omeprazole (PRILOSEC) 40 MG capsule, Take 40 mg by mouth in the morning and at bedtime., Disp: , Rfl:    OXcarbazepine (TRILEPTAL) 300 MG tablet, Take 1 tablet (300 mg total) by mouth 2 (two) times daily., Disp: 60 tablet, Rfl: 1   paliperidone (INVEGA) 6 MG 24 hr tablet, Take 1 tablet (6 mg total) by mouth daily., Disp: 30 tablet, Rfl: 1   PORTIA-28 0.15-30 MG-MCG tablet, Take 1 tablet by mouth at bedtime., Disp: 84 tablet, Rfl: 1   Potassium 99 MG TABS, Take 99 mg by mouth daily., Disp: , Rfl:    prazosin (MINIPRESS) 1 MG capsule, Take 1 capsule (1 mg total) by mouth as directed. TAKE ONE IN THE AM AND 3 AT NIGHT, Disp: 120 capsule, Rfl: 0   Respiratory Therapy Supplies (NEBULIZER/TUBING/MOUTHPIECE) KIT, Inhale 1 vial via nebulizer every 4-6 hours as needed for cough, wheeze, shortness of breath and chest tighness., Disp: 1 kit, Rfl: 0   thiamine (VITAMIN B1) 100 MG tablet, Take 100 mg by mouth daily., Disp: , Rfl:    Tiotropium Bromide Monohydrate (SPIRIVA RESPIMAT) 1.25 MCG/ACT AERS, Inhale 2 puffs into the lungs daily., Disp: 12 g, Rfl: 1   traZODone (DESYREL) 50 MG tablet, Take 0.5 tablets (25 mg total) by mouth at bedtime as needed for sleep., Disp: 15 tablet, Rfl: 1   VENTOLIN HFA 108 (90 Base) MCG/ACT inhaler, Inhale 2 puffs into the lungs every 6 (six) hours as needed for wheezing or shortness of breath., Disp: 18 g, Rfl: 0   Zinc 50 MG TABS, Take 50 mg by mouth daily., Disp: , Rfl:    Medications ordered in this encounter:  Meds ordered this encounter  Medications   amoxicillin-clavulanate (AUGMENTIN) 875-125 MG tablet    Sig: Take 1 tablet by mouth 2 (two) times daily.    Dispense:  20 tablet    Refill:  0    Supervising Provider:   Merrilee [name] [[id number]]   fluconazole  (DIFLUCAN) 150 MG tablet    Sig: Take 1 tablet (150 mg total) by mouth every 3 (three) days as needed.    Dispense:  3 tablet    Refill:  0    Supervising Provider:   Merrilee [name] [[id number]]     *If you need refills on other medications prior to your next appointment, please contact your pharmacy*  Follow-Up: Call back or seek an in-person evaluation if the symptoms worsen or if the condition fails to improve as anticipated.  [facility] [phone]  Other Instructions  Skin Abscess  A skin abscess is an infected area on or under your skin. It contains pus and other material. An abscess may also be called a furuncle, carbuncle, or boil. It is often the result of an infection caused by bacteria. An abscess can occur in or on almost any part of your body. Sometimes, an abscess may break open (rupture) on its own. In most cases, it will keep getting worse unless it is treated. An abscess can cause pain and make you feel ill. An untreated abscess can cause infection to spread to other parts of your body or your bloodstream. The abscess may need to be drained. You may also need to take antibiotics. What are the causes? An abscess occurs when germs, like bacteria, pass through your skin and cause an infection. This may be caused by: A scrape or cut on your skin. A puncture wound through your skin, such as a needle injection or insect bite. Blocked oil or sweat glands. Blocked and infected hair follicles. A fluid-filled sac that forms beneath your skin (sebaceous cyst) and becomes infected. What increases the risk? You may be more likely to develop an abscess if: You have problems with blood circulation, or you have a weak body defense system (immune system). You have diabetes. You have dry and irritated skin. You get injections often or use IV drugs. You have a foreign body in a wound, such as a splinter. You smoke or use tobacco products. What are the signs or  symptoms? Symptoms of this condition include: A painful, firm bump under the skin. A bump with pus at the top. This may break through the skin and drain. Other symptoms include: Redness and swelling around the abscess. Warmth or tenderness. Swelling of the lymph nodes (glands) near the abscess. A sore on the skin. How is this diagnosed? This condition may be diagnosed based on a physical exam and your medical history. You may also have tests done, such as: A test of a sample of pus. This may be done to find what is causing the infection. Blood tests. Imaging tests, such as an ultrasound, CT scan, or MRI. How is this treated? A small abscess that drains on its own may not need to be treated. Treatment for larger abscesses may include: Moist heat or a heat pack applied to the area a few times a day. Incision and drainage. This is a procedure to drain the abscess. Antibiotics. For a severe abscess, you may first get antibiotics through an IV and then change to antibiotics by mouth. Follow these instructions at home: Medicines Take over-the-counter and prescription medicines only as told by your provider. If you were prescribed antibiotics, take them as told by your provider. Do not stop using the antibiotic even if you start to feel better. Abscess care  If you have an abscess that has not drained, apply heat to the affected area. Use the heat source that your provider recommends, such as a moist heat pack or a heating pad. Place a towel between your skin and the heat source. Leave the heat on for 20-30 minutes at a time. If your skin turns bright red, remove the heat right away to prevent burns. The risk of burns is higher if you cannot feel pain, heat, or cold. Follow instructions from your provider about how to take care of your abscess. Make sure you: Cover the abscess with a bandage (dressing). Wash your hands with soap and water for at least 20 seconds before and after you  change  the dressing or gauze. If soap and water are not available, use hand sanitizer. Change your dressing or gauze as told by your provider. Check your abscess every day for signs of an infection that is getting worse. Check for: More redness, swelling, pain, or tenderness. More fluid or blood. Warmth. More pus or a worse smell. General instructions To avoid spreading the infection: Do not share personal care items, towels, or hot tubs with others. Avoid making skin contact with other people. Be careful when getting rid of used dressings, wound packing, or any drainage from the abscess. Do not use any products that contain nicotine or tobacco. These products include cigarettes, chewing tobacco, and vaping devices, such as e-cigarettes. If you need help quitting, ask your provider. Do not use any creams, ointments, or liquids unless you have been told to by your provider. Contact a health care provider if: You see redness that spreads quickly or red streaks on your skin spreading away from the abscess. You have any signs of worse infection at the abscess. You vomit every time you eat or drink. You have a fever, chills, or muscle aches. The cyst or abscess returns. Get help right away if: You have severe pain. You make less pee (urine) than normal. This information is not intended to replace advice given to you by your health care provider. Make sure you discuss any questions you have with your health care provider. Document Revised: 09/03/2021 Document Reviewed: 09/03/2021 Elsevier Patient Education  2024 Elsevier Inc.  If you have been instructed to have an in-person evaluation today at a local Urgent Care facility, please use the link below. It will take you to a list of all of our available [facility], including address, phone number and hours of operation. Please do not delay care.  [facility]  If you or a family member do not have a primary care provider, use  the link below to schedule a visit and establish care. When you choose a [facility] primary care physician or advanced practice provider, you gain a long-term partner in health. Find a Primary Care Provider  Learn more about [facility]'s in-office and virtual care options:  - Get Care Now

## 2023-05-21 ENCOUNTER — Other Ambulatory Visit: Payer: Self-pay

## 2023-05-23 ENCOUNTER — Telehealth: Admitting: Physician Assistant

## 2023-05-23 DIAGNOSIS — N760 Acute vaginitis: Secondary | ICD-10-CM | POA: Diagnosis not present

## 2023-05-23 DIAGNOSIS — B9689 Other specified bacterial agents as the cause of diseases classified elsewhere: Secondary | ICD-10-CM

## 2023-05-23 MED ORDER — METRONIDAZOLE 0.75 % EX GEL: 1.0000 | Freq: Every evening | CUTANEOUS | 0 refills | Status: AC

## 2023-05-23 NOTE — Progress Notes (Signed)
 Virtual Visit Consent   [name], you are scheduled for a virtual visit with a [facility] provider today. Just as with appointments in the office, your consent must be obtained to participate. Your consent will be active for this visit and any virtual visit you may have with one of our providers in the next 365 days. If you have a MyChart account, a copy of this consent can be sent to you electronically.  As this is a virtual visit, video technology does not allow for your provider to perform a traditional examination. This may limit your provider's ability to fully assess your condition. If your provider identifies any concerns that need to be evaluated in person or the need to arrange testing (such as labs, EKG, etc.), we will make arrangements to do so. Although advances in technology are sophisticated, we cannot ensure that it will always work on either your end or our end. If the connection with a video visit is poor, the visit may have to be switched to a telephone visit. With either a video or telephone visit, we are not always able to ensure that we have a secure connection.  By engaging in this virtual visit, you consent to the provision of healthcare and authorize for your insurance to be billed (if applicable) for the services provided during this visit. Depending on your insurance coverage, you may receive a charge related to this service.  I need to obtain your verbal consent now. Are you willing to proceed with your visit today? [name] has provided verbal consent on 05/23/2023 for a virtual visit (video or telephone). [name], [place]  Date: 05/23/2023 5:37 PM   Virtual Visit via Video Note   I, [name], connected with  [name]  ([id number], Sep 13, 1985) on 05/23/23 at  5:30 PM EDT by a video-enabled telemedicine application and verified that I am speaking with the correct person using two identifiers.  Location: Patient: Virtual Visit Location Patient:  Home Provider: Virtual Visit Location Provider: Home Office   I discussed the limitations of evaluation and management by telemedicine and the availability of in person appointments. The patient expressed understanding and agreed to proceed.    History of Present Illness: [name] is a 38 y.o. who identifies as a female who was assigned female at birth, and is being seen today for vaginal discharge. [name]  HPI: Vaginal Itching The patient's primary symptoms include genital itching and vaginal discharge. This is a new problem. The current episode started in the past 7 days. The problem occurs constantly. The patient is experiencing no pain. Pertinent negatives include no abdominal pain, fever, flank pain or nausea. The vaginal discharge was thick and white. There has been no bleeding. She has not been passing clots. She has not been passing tissue. She has tried nothing for the symptoms. The treatment provided no relief. She is sexually active. No, her partner does not have an STD. Her past medical history is significant for vaginosis.    Problems:  Patient Active Problem List   Diagnosis Date Noted   Panic 11/14/2022   Abscess of multiple sites 11/09/2022   Smoker 03/20/2022   HTN (hypertension) 03/20/2022   Vaginitis 03/20/2022   Suicide ideation 02/18/2022   Substance abuse (HCC) 02/18/2022   Bipolar 1 disorder, manic, full remission (HCC) 02/18/2022   Abscess of external cheek, left 01/01/2022   Rash 12/25/2021   Asthma exacerbation 11/26/2021   STD exposure 11/26/2021   Insect bite  11/26/2021   Bipolar affective disorder (HCC) 11/13/2021   Encounter for well adult exam with abnormal findings 11/12/2021   Cellulitis 11/12/2021   Vaginal discharge 08/26/2021   BRBPR (bright red blood per rectum) 02/27/2021   Anal fissure 02/27/2021   Facial abscess 02/12/2021   Folliculitis 12/21/2020   Bipolar 2 disorder, major depressive episode (HCC) 12/20/2020   Vitamin D  deficiency  07/15/2020   B12 deficiency 07/15/2020   MDD (major depressive disorder), recurrent severe, without psychosis (HCC) 04/26/2020   PTSD (post-traumatic stress disorder) 01/03/2020   Allergic asthma 01/13/2018   Allergic rhinitis 03/12/2017   Class 2 obesity due to excess calories without serious comorbidity with body mass index (BMI) of 37.0 to 37.9 in adult 04/17/2016   Acute asthmatic bronchitis 03/13/2016   Borderline personality disorder in adult [facility]) 03/13/2016   Mood disorder (HCC) 03/04/2013    Allergies:  Allergies  Allergen Reactions   Benztropine Other (See Comments)    agitation     Betadine [Povidone Iodine] Swelling   Other Other (See Comments)    Opiates- history of dependency    Lamictal [Lamotrigine] Rash   Medications:  Current Outpatient Medications:    amLODipine  (NORVASC ) 5 MG tablet, Take 1 tablet (5 mg total) by mouth daily., Disp: 90 tablet, Rfl: 3   metroNIDAZOLE  (METROGEL ) 0.75 % gel, Apply 1 Application topically at bedtime for 5 days., Disp: 5 g, Rfl: 0   albuterol  (PROVENTIL ) (2.5 MG/3ML) 0.083% nebulizer solution, Inhale 1 vial (2.5 mg) via nebulizer every 4-6 as needed for cough, wheeze, shortness of breath and chest tightness., Disp: 75 mL, Rfl: 2   albuterol  (VENTOLIN  HFA) 108 (90 Base) MCG/ACT inhaler, Inhale 2 puffs into the lungs every 4 (four) hours as needed for wheezing or shortness of breath., Disp: 18 g, Rfl: 1   amoxicillin -clavulanate (AUGMENTIN ) 875-125 MG tablet, Take 1 tablet by mouth 2 (two) times daily., Disp: 20 tablet, Rfl: 0   Blood Pressure Monitoring (BLOOD PRESSURE CUFF) MISC, 1 Device by Does not apply route daily as needed., Disp: 1 each, Rfl: 0   calcium  carbonate (OS-CAL - DOSED IN MG OF ELEMENTAL CALCIUM ) 1250 (500 Ca) MG tablet, Take 1 tablet (1,250 mg total) by mouth daily with breakfast., Disp: 30 tablet, Rfl: 0   cetirizine  (ZYRTEC ) 10 MG tablet, Take 1 tablet (10 mg total) by mouth daily., Disp: 90 tablet, Rfl: 1    clonazePAM  (KLONOPIN ) 1 MG tablet, Take 1 tablet (1 mg total) by mouth 2 (two) times daily as needed for anxiety., Disp: 60 tablet, Rfl: 2   dupilumab  (DUPIXENT ) 300 MG/2ML prefilled syringe, Inject 300 mg into the skin every 14 (fourteen) days., Disp: 4 mL, Rfl: 11   EPINEPHrine  (EPIPEN  2-PAK) 0.3 mg/0.3 mL IJ SOAJ injection, Inject 0.3 mg into the muscle as needed for anaphylaxis., Disp: 2 each, Rfl: 2   famotidine  (PEPCID ) 20 MG tablet, Take 1 tablet (20 mg total) by mouth 2 (two) times daily., Disp: 180 tablet, Rfl: 1   famotidine  (PEPCID ) 20 MG tablet, Take 20 mg by mouth 2 (two) times daily., Disp: , Rfl:    ferrous sulfate  325 (65 FE) MG tablet, Take 1 tablet (325 mg total) by mouth daily., Disp: 30 tablet, Rfl: 0   fluconazole  (DIFLUCAN ) 150 MG tablet, Take 1 tablet (150 mg total) by mouth every 3 (three) days as needed., Disp: 3 tablet, Rfl: 0   FLUoxetine  (PROZAC ) 20 MG capsule, Take 1 capsule (20 mg total) by mouth daily., Disp: 90 capsule, Rfl:  3   fluticasone  (FLONASE ) 50 MCG/ACT nasal spray, Place 2 sprays into both nostrils daily., Disp: , Rfl:    fluticasone -salmeterol (ADVAIR HFA) 115-21 MCG/ACT inhaler, Inhale 2 puffs into the lungs 2 (two) times daily., Disp: 1 each, Rfl: 5   gabapentin  (NEURONTIN ) 300 MG capsule, Take 300 mg by mouth 3 (three) times daily., Disp: , Rfl:    Guaifenesin  1200 MG TB12, Take 1 tablet (1,200 mg total) by mouth every 12 (twelve) hours as needed., Disp: 30 tablet, Rfl: 0   hydrOXYzine  (ATARAX ) 50 MG tablet, Take 1 tablet (50 mg total) by mouth 2 (two) times daily as needed for anxiety., Disp: 60 tablet, Rfl: 1   montelukast  (SINGULAIR ) 10 MG tablet, Take 1 tablet (10 mg total) by mouth at bedtime., Disp: 90 tablet, Rfl: 1   montelukast  (SINGULAIR ) 10 MG tablet, Take 10 mg by mouth at bedtime., Disp: , Rfl:    Multiple Vitamin (MULTIVITAMIN) tablet, Take 1 tablet by mouth daily. Women's Daily Vitamin, Disp: , Rfl:    nicotine  (NICODERM CQ  - DOSED IN MG/24  HOURS) 14 mg/24hr patch, Place 1 patch (14 mg total) onto the skin daily., Disp: 28 patch, Rfl: 5   OLANZapine  zydis (ZYPREXA ) 5 MG disintegrating tablet, Take 1 tablet (5 mg total) by mouth at bedtime., Disp: 30 tablet, Rfl: 0   omeprazole  (PRILOSEC) 40 MG capsule, Take 1 capsule (40 mg total) by mouth in the morning and at bedtime. Take 1 capsule (40 mg total) by mouth 2 (two) times daily., Disp: 180 capsule, Rfl: 1   omeprazole  (PRILOSEC) 40 MG capsule, Take 40 mg by mouth in the morning and at bedtime., Disp: , Rfl:    OXcarbazepine  (TRILEPTAL ) 300 MG tablet, Take 1 tablet (300 mg total) by mouth 2 (two) times daily., Disp: 60 tablet, Rfl: 1   paliperidone  (INVEGA ) 6 MG 24 hr tablet, Take 1 tablet (6 mg total) by mouth daily., Disp: 30 tablet, Rfl: 1   PORTIA-28 0.15-30 MG-MCG tablet, Take 1 tablet by mouth at bedtime., Disp: 84 tablet, Rfl: 1   Potassium 99 MG TABS, Take 99 mg by mouth daily., Disp: , Rfl:    prazosin  (MINIPRESS ) 1 MG capsule, Take 1 capsule (1 mg total) by mouth as directed. TAKE ONE IN THE AM AND 3 AT NIGHT, Disp: 120 capsule, Rfl: 0   Respiratory Therapy Supplies (NEBULIZER/TUBING/MOUTHPIECE) KIT, Inhale 1 vial via nebulizer every 4-6 hours as needed for cough, wheeze, shortness of breath and chest tighness., Disp: 1 kit, Rfl: 0   thiamine  (VITAMIN B1) 100 MG tablet, Take 100 mg by mouth daily., Disp: , Rfl:    Tiotropium Bromide Monohydrate  (SPIRIVA  RESPIMAT) 1.25 MCG/ACT AERS, Inhale 2 puffs into the lungs daily., Disp: 12 g, Rfl: 1   traZODone  (DESYREL ) 50 MG tablet, Take 0.5 tablets (25 mg total) by mouth at bedtime as needed for sleep., Disp: 15 tablet, Rfl: 1   VENTOLIN  HFA 108 (90 Base) MCG/ACT inhaler, Inhale 2 puffs into the lungs every 6 (six) hours as needed for wheezing or shortness of breath., Disp: 18 g, Rfl: 0   Zinc  50 MG TABS, Take 50 mg by mouth daily., Disp: , Rfl:   Observations/Objective: Patient is well-developed, well-nourished in no acute distress.   Resting comfortably  at home.  Head is normocephalic, atraumatic.  No labored breathing.  Speech is clear and coherent with logical content.  Patient is alert and oriented at baseline.    Assessment and Plan: 1. Bacterial vaginosis (Primary)  Patient  presenting with dysuria most consistent with vaginosis  I also considered PID, pregnancy, ectopic pregnancy, endometriosis, tubovarian abscess, appendicitis,   and pyelonephritis, but this appears less likely considering the data gathered thus far. Antibiotic prescribed.  I have instructed the patient to present to the nearest ER at any time if there are any new or worsening symptoms.  The patient expressed understanding of and agreement with this plan.  Opportunity was given for questions prior to discharge and all stated questions were answered to the patient's satisfaction.   Follow Up Instructions: I discussed the assessment and treatment plan with the patient. The patient was provided an opportunity to ask questions and all were answered. The patient agreed with the plan and demonstrated an understanding of the instructions.  A copy of instructions were sent to the patient via MyChart unless otherwise noted below.     The patient was advised to call back or seek an in-person evaluation if the symptoms worsen or if the condition fails to improve as anticipated.    Beverly Settle, PA-C

## 2023-05-23 NOTE — Patient Instructions (Signed)
 Consepcion Dell, thank you for joining [name], PA-C for today's virtual visit.  While this provider is not your primary care provider (PCP), if your PCP is located in our provider database this encounter information will be shared with them immediately following your visit.   A [facility] MyChart account gives you access to today's visit and all your visits, tests, and labs performed at [facility] " click here if you don't have a [facility] MyChart account or go to [email or web address]  Consent: (Patient) BRETTNEY FICKEN provided verbal consent for this virtual visit at the beginning of the encounter.  Current Medications:  Current Outpatient Medications:    amLODipine  (NORVASC ) 5 MG tablet, Take 1 tablet (5 mg total) by mouth daily., Disp: 90 tablet, Rfl: 3   metroNIDAZOLE  (METROGEL ) 0.75 % gel, Apply 1 Application topically at bedtime for 5 days., Disp: 5 g, Rfl: 0   albuterol  (PROVENTIL ) (2.5 MG/3ML) 0.083% nebulizer solution, Inhale 1 vial (2.5 mg) via nebulizer every 4-6 as needed for cough, wheeze, shortness of breath and chest tightness., Disp: 75 mL, Rfl: 2   albuterol  (VENTOLIN  HFA) 108 (90 Base) MCG/ACT inhaler, Inhale 2 puffs into the lungs every 4 (four) hours as needed for wheezing or shortness of breath., Disp: 18 g, Rfl: 1   amoxicillin -clavulanate (AUGMENTIN ) 875-125 MG tablet, Take 1 tablet by mouth 2 (two) times daily., Disp: 20 tablet, Rfl: 0   Blood Pressure Monitoring (BLOOD PRESSURE CUFF) MISC, 1 Device by Does not apply route daily as needed., Disp: 1 each, Rfl: 0   calcium  carbonate (OS-CAL - DOSED IN MG OF ELEMENTAL CALCIUM ) 1250 (500 Ca) MG tablet, Take 1 tablet (1,250 mg total) by mouth daily with breakfast., Disp: 30 tablet, Rfl: 0   cetirizine  (ZYRTEC ) 10 MG tablet, Take 1 tablet (10 mg total) by mouth daily., Disp: 90 tablet, Rfl: 1   clonazePAM  (KLONOPIN ) 1 MG tablet, Take 1 tablet (1 mg total) by mouth 2 (two) times daily as needed for  anxiety., Disp: 60 tablet, Rfl: 2   dupilumab  (DUPIXENT ) 300 MG/2ML prefilled syringe, Inject 300 mg into the skin every 14 (fourteen) days., Disp: 4 mL, Rfl: 11   EPINEPHrine  (EPIPEN  2-PAK) 0.3 mg/0.3 mL IJ SOAJ injection, Inject 0.3 mg into the muscle as needed for anaphylaxis., Disp: 2 each, Rfl: 2   famotidine  (PEPCID ) 20 MG tablet, Take 1 tablet (20 mg total) by mouth 2 (two) times daily., Disp: 180 tablet, Rfl: 1   famotidine  (PEPCID ) 20 MG tablet, Take 20 mg by mouth 2 (two) times daily., Disp: , Rfl:    ferrous sulfate  325 (65 FE) MG tablet, Take 1 tablet (325 mg total) by mouth daily., Disp: 30 tablet, Rfl: 0   fluconazole  (DIFLUCAN ) 150 MG tablet, Take 1 tablet (150 mg total) by mouth every 3 (three) days as needed., Disp: 3 tablet, Rfl: 0   FLUoxetine  (PROZAC ) 20 MG capsule, Take 1 capsule (20 mg total) by mouth daily., Disp: 90 capsule, Rfl: 3   fluticasone  (FLONASE ) 50 MCG/ACT nasal spray, Place 2 sprays into both nostrils daily., Disp: , Rfl:    fluticasone -salmeterol (ADVAIR HFA) 115-21 MCG/ACT inhaler, Inhale 2 puffs into the lungs 2 (two) times daily., Disp: 1 each, Rfl: 5   gabapentin  (NEURONTIN ) 300 MG capsule, Take 300 mg by mouth 3 (three) times daily., Disp: , Rfl:    Guaifenesin  1200 MG TB12, Take 1 tablet (1,200 mg total) by mouth every 12 (twelve) hours as needed., Disp: 30 tablet, Rfl: 0  hydrOXYzine  (ATARAX ) 50 MG tablet, Take 1 tablet (50 mg total) by mouth 2 (two) times daily as needed for anxiety., Disp: 60 tablet, Rfl: 1   montelukast  (SINGULAIR ) 10 MG tablet, Take 1 tablet (10 mg total) by mouth at bedtime., Disp: 90 tablet, Rfl: 1   montelukast  (SINGULAIR ) 10 MG tablet, Take 10 mg by mouth at bedtime., Disp: , Rfl:    Multiple Vitamin (MULTIVITAMIN) tablet, Take 1 tablet by mouth daily. Women's Daily Vitamin, Disp: , Rfl:    nicotine  (NICODERM CQ  - DOSED IN MG/24 HOURS) 14 mg/24hr patch, Place 1 patch (14 mg total) onto the skin daily., Disp: 28 patch, Rfl: 5    OLANZapine  zydis (ZYPREXA ) 5 MG disintegrating tablet, Take 1 tablet (5 mg total) by mouth at bedtime., Disp: 30 tablet, Rfl: 0   omeprazole  (PRILOSEC) 40 MG capsule, Take 1 capsule (40 mg total) by mouth in the morning and at bedtime. Take 1 capsule (40 mg total) by mouth 2 (two) times daily., Disp: 180 capsule, Rfl: 1   omeprazole  (PRILOSEC) 40 MG capsule, Take 40 mg by mouth in the morning and at bedtime., Disp: , Rfl:    OXcarbazepine  (TRILEPTAL ) 300 MG tablet, Take 1 tablet (300 mg total) by mouth 2 (two) times daily., Disp: 60 tablet, Rfl: 1   paliperidone  (INVEGA ) 6 MG 24 hr tablet, Take 1 tablet (6 mg total) by mouth daily., Disp: 30 tablet, Rfl: 1   PORTIA-28 0.15-30 MG-MCG tablet, Take 1 tablet by mouth at bedtime., Disp: 84 tablet, Rfl: 1   Potassium 99 MG TABS, Take 99 mg by mouth daily., Disp: , Rfl:    prazosin  (MINIPRESS ) 1 MG capsule, Take 1 capsule (1 mg total) by mouth as directed. TAKE ONE IN THE AM AND 3 AT NIGHT, Disp: 120 capsule, Rfl: 0   Respiratory Therapy Supplies (NEBULIZER/TUBING/MOUTHPIECE) KIT, Inhale 1 vial via nebulizer every 4-6 hours as needed for cough, wheeze, shortness of breath and chest tighness., Disp: 1 kit, Rfl: 0   thiamine  (VITAMIN B1) 100 MG tablet, Take 100 mg by mouth daily., Disp: , Rfl:    Tiotropium Bromide Monohydrate  (SPIRIVA  RESPIMAT) 1.25 MCG/ACT AERS, Inhale 2 puffs into the lungs daily., Disp: 12 g, Rfl: 1   traZODone  (DESYREL ) 50 MG tablet, Take 0.5 tablets (25 mg total) by mouth at bedtime as needed for sleep., Disp: 15 tablet, Rfl: 1   VENTOLIN  HFA 108 (90 Base) MCG/ACT inhaler, Inhale 2 puffs into the lungs every 6 (six) hours as needed for wheezing or shortness of breath., Disp: 18 g, Rfl: 0   Zinc  50 MG TABS, Take 50 mg by mouth daily., Disp: , Rfl:    Medications ordered in this encounter:  Meds ordered this encounter  Medications   metroNIDAZOLE  (METROGEL ) 0.75 % gel    Sig: Apply 1 Application topically at bedtime for 5 days.     Dispense:  5 g    Refill:  0    Supervising Provider:   Corine Dice [[id number]]     *If you need refills on other medications prior to your next appointment, please contact your pharmacy*  Follow-Up: Call back or seek an in-person evaluation if the symptoms worsen or if the condition fails to improve as anticipated.  [facility] [phone]  Other Instructions Please report to the nearest Emergency room with any worsening symptoms. Follow up with primary care provider (PCP) in 2 -3 days.    If you have been instructed to have an in-person  evaluation today at a local Urgent Care facility, please use the link below. It will take you to a list of all of our available [facility], including address, phone number and hours of operation. Please do not delay care.  [facility]  If you or a family member do not have a primary care provider, use the link below to schedule a visit and establish care. When you choose a [facility] primary care physician or advanced practice provider, you gain a long-term partner in health. Find a Primary Care Provider  Learn more about [facility]'s in-office and virtual care options: [facility] - Get Care Now

## 2023-05-27 ENCOUNTER — Other Ambulatory Visit: Payer: Self-pay

## 2023-05-27 ENCOUNTER — Other Ambulatory Visit: Payer: Self-pay | Admitting: Allergy & Immunology

## 2023-05-27 DIAGNOSIS — J4541 Moderate persistent asthma with (acute) exacerbation: Secondary | ICD-10-CM

## 2023-06-02 ENCOUNTER — Other Ambulatory Visit: Payer: Self-pay

## 2023-06-24 ENCOUNTER — Encounter: Payer: Self-pay | Admitting: Allergy & Immunology

## 2023-06-24 ENCOUNTER — Ambulatory Visit (INDEPENDENT_AMBULATORY_CARE_PROVIDER_SITE_OTHER): Admitting: Allergy & Immunology

## 2023-06-24 ENCOUNTER — Other Ambulatory Visit: Payer: Self-pay

## 2023-06-24 VITALS — BP 122/74 | HR 90 | Temp 98.6°F | Wt 259.7 lb

## 2023-06-24 DIAGNOSIS — R0602 Shortness of breath: Secondary | ICD-10-CM

## 2023-06-24 DIAGNOSIS — J3089 Other allergic rhinitis: Secondary | ICD-10-CM | POA: Diagnosis not present

## 2023-06-24 DIAGNOSIS — J302 Other seasonal allergic rhinitis: Secondary | ICD-10-CM

## 2023-06-24 DIAGNOSIS — J4540 Moderate persistent asthma, uncomplicated: Secondary | ICD-10-CM

## 2023-06-24 DIAGNOSIS — B999 Unspecified infectious disease: Secondary | ICD-10-CM

## 2023-06-24 DIAGNOSIS — K219 Gastro-esophageal reflux disease without esophagitis: Secondary | ICD-10-CM

## 2023-06-24 MED ORDER — SPIRIVA RESPIMAT 1.25 MCG/ACT IN AERS: 2.0000 | INHALATION_SPRAY | Freq: Every day | RESPIRATORY_TRACT | 1 refills | Status: DC

## 2023-06-24 MED ORDER — ONDANSETRON 8 MG PO TBDP: 8.0000 mg | ORAL_TABLET | Freq: Three times a day (TID) | ORAL | 0 refills | Status: AC | PRN

## 2023-06-24 MED ORDER — FAMOTIDINE 20 MG PO TABS: 20.0000 mg | ORAL_TABLET | Freq: Two times a day (BID) | ORAL | 1 refills | Status: DC

## 2023-06-24 MED ORDER — CETIRIZINE HCL 10 MG PO TABS: 10.0000 mg | ORAL_TABLET | Freq: Every day | ORAL | 1 refills | Status: DC

## 2023-06-24 MED ORDER — ALBUTEROL SULFATE HFA 108 (90 BASE) MCG/ACT IN AERS: 2.0000 | INHALATION_SPRAY | RESPIRATORY_TRACT | 1 refills | Status: DC | PRN

## 2023-06-24 MED ORDER — FLUTICASONE PROPIONATE 50 MCG/ACT NA SUSP: 2.0000 | Freq: Every day | NASAL | 3 refills | Status: AC

## 2023-06-24 MED ORDER — MONTELUKAST SODIUM 10 MG PO TABS: 10.0000 mg | ORAL_TABLET | Freq: Every day | ORAL | 1 refills | Status: DC

## 2023-06-24 MED ORDER — OMEPRAZOLE 40 MG PO CPDR: 40.0000 mg | DELAYED_RELEASE_CAPSULE | Freq: Two times a day (BID) | ORAL | 1 refills | Status: AC

## 2023-06-24 NOTE — Progress Notes (Signed)
 FOLLOW UP  Date of Service/Encounter:  06/24/23   Assessment:   Moderate persistent asthma without complication - doing a little bit better on the Dupixent  (administered at home)   AEC 400 in September 2024    Seasonal and perennial allergic rhinitis (dust mites, cats, dogs, cockroach, trees, weeds, grass, mouse, and horse) - interested in restarting allergen immunotherapy at the [facility] office   Boderline personality disorder - with worsening status since breaking up with her significant other of 13 years (currently on fluoxetine , paliperidone , and oxcarbazepine )  PTSD - on prazosin    Recurrent infections - has not gotten labs from the last visit done  Plan/Recommendations:   1. Moderate persistent asthma, uncomplicated - Lung testing looked good today. - I will try to figure out the shower chair thing (I will talk to your PCP about this). - I do want you do to see Cardiology as well due to shortness of breath. - Referral made.  - Daily controller medication(s): Singulair  10mg  daily and Breztri  two puffs twice daily with spacer - Prior to physical activity: albuterol  2 puffs 10-15 minutes before physical activity. - Rescue medications: albuterol  4 puffs every 4-6 hours as needed - Asthma control goals:  * Full participation in all desired activities (may need albuterol  before activity) * Albuterol  use two time or less a week on average (not counting use with activity) * Cough interfering with sleep two time or less a month * Oral steroids no more than once a year * No hospitalizations  2. Seasonal and perennial allergic rhinitis (dust mites, cats, dogs, cockroach, trees, weeds, grass, mouse, and horse) - Call the [facility] office before you restart the allergy  shots (since they might have to be remixed).  - Continue with: hydroxyzine  one tablet twice daily, Singulair  (montelukast ) 10mg  daily, Flonase  (fluticasone ) two sprays per nostril daily - You can use an extra dose of the  antihistamine, if needed, for breakthrough symptoms.  - Continue with nasal saline rinses as needed.    3. Eczema - Continue with the hydrocortisone  as needed.   4. GERD  - Continue with omeprazole  40mg  BID. - Continue with famotidine  20mg  twice daily as well.   5. Return in about 6 months (around 12/25/2023). You can have the follow up appointment with Dr. Idolina Maker or a Nurse Practicioner (our Nurse Practitioners are excellent and always have Physician oversight!).    Subjective:   MALIE KASHANI is a 38 y.o. female presenting today for follow up of  Chief Complaint  Patient presents with   Asthma    DELENA CASEBEER has a history of the following: Patient Active Problem List   Diagnosis Date Noted   Panic 11/14/2022   Abscess of multiple sites 11/09/2022   Smoker 03/20/2022   HTN (hypertension) 03/20/2022   Vaginitis 03/20/2022   Suicide ideation 02/18/2022   Substance abuse (HCC) 02/18/2022   Bipolar 1 disorder, manic, full remission (HCC) 02/18/2022   Abscess of external cheek, left 01/01/2022   Rash 12/25/2021   Asthma exacerbation 11/26/2021   STD exposure 11/26/2021   Insect bite 11/26/2021   Bipolar affective disorder (HCC) 11/13/2021   Encounter for well adult exam with abnormal findings 11/12/2021   Cellulitis 11/12/2021   Vaginal discharge 08/26/2021   BRBPR (bright red blood per rectum) 02/27/2021   Anal fissure 02/27/2021   Facial abscess 02/12/2021   Folliculitis 12/21/2020   Bipolar 2 disorder, major depressive episode (HCC) 12/20/2020   Vitamin D  deficiency 07/15/2020   B12  deficiency 07/15/2020   MDD (major depressive disorder), recurrent severe, without psychosis (HCC) 04/26/2020   PTSD (post-traumatic stress disorder) 01/03/2020   Allergic asthma 01/13/2018   Allergic rhinitis 03/12/2017   Class 2 obesity due to excess calories without serious comorbidity with body mass index (BMI) of 37.0 to 37.9 in adult 04/17/2016   Acute asthmatic  bronchitis 03/13/2016   Borderline personality disorder in adult [facility]) 03/13/2016   Mood disorder (HCC) 03/04/2013    History obtained from: chart review and patient.  Discussed the use of AI scribe software for clinical note transcription with the patient and/or guardian, who gave verbal consent to proceed.  Clementine is a 38 y.o. female presenting for a follow up visit.  She was last seen in January 2025.  At that time, lung testing looked good.  We decided to get Dupixent  back on board.  We did give her a Breztri  sample and started on prednisone  burst.  For her rhinitis, we continue with hydroxyzine  as well as Singulair  and Flonase .  She did decide to start allergen immunotherapy.  For her GERD, we continue with omeprazole  40 mg daily as well as famotidine  20 mg twice daily.  We did send her to GI.  Since last visit, she unfortunately has not done well.   Asthma/Respiratory Symptom History: She experiences significant dyspnea, particularly when ascending or descending stairs or standing in the shower, leading her to request a shower chair. Her breathing difficulties are frequent, and she avoids regular showers due to the exertion required.  She has been off Dupixent  for 3 to 4 months, which might be related to her current shortness of breath.  She also has been out of her controller medications and would like a refill for all of this.   She has been off Dupixent  for three to four months due to episodes of pneumonia, bronchitis, and the flu. She is unsure about restarting the medication and whether a loading dose is necessary.  She denies any, cardiac history.  She does not see a cardiologist.  However, she has noted some low blood pressure including at today's visit.  She actually is on prazosin  for PTSD, which also affects her blood pressure.  She is on amlodipine  5 mg as well for blood pressure management which was prescribed by her PCP. She hasn ever had an echocardiogram. She reports a weight gain  of 30-40 pounds which is likely affecting her breathing as well.   Allergic Rhinitis Symptom History: Remains interested in doing the allergy  shots, but she stopped for several weeks because she was sick with the pneumonia and bronchitis.  She is on the hydroxyzine  1 tablet up to twice daily as well as the montelukast .  She has Flonase  as well.  Her Medicaid was covering her transportation to and from her home in the [facility] office.  Skin Symptom History: Eczema was controlled with hydrocortisone  as needed.  Overall, her skin is under pretty good control all things considered.  GERD Symptom History: She remains on the omeprazole  40 mg twice a day as well as famotidine  20 mg twice a day.  She does feel like her 30 pound weight gain has made her reflux a lot worse, she knows that she needs to work on losing weight.  Infection Symptom History: She is dealing with a cheek piercing infection, previously treated with Augmentin  and currently on Clindamycin.   She smokes to manage anxiety and finds it challenging to quit. She has been in therapy for over a  year and is dissatisfied with her current psychiatric medication regimen, feeling it is inadequate in managing her symptoms.  She has been having a lot of nausea.  She has been treating this with Zofran  from her roommate.  She is wondering if she can get a prescription, but I did recommend that she look into the reasoning for this nausea.  It certainly could be related to her low blood pressure, but I would feel more comfortable if she would talk to her PCP about it and decide where to go get that evaluated.  She lives in shared accommodation with three roommates and relies on disability benefits for her living expenses. She has a complicated relationship with her mother, who assists with medication expenses.   Otherwise, there have been no changes to her past medical history, surgical history, family history, or social history.    Review of systems  otherwise negative other than that mentioned in the HPI.    Objective:   Blood pressure 98/72, pulse 90, temperature 98.6 F (37 C), temperature source Temporal, weight 259 lb 11.2 oz (117.8 kg), SpO2 96%. Body mass index is 47.5 kg/m.    Physical Exam Vitals reviewed.  Constitutional:      Appearance: She is well-developed.     Comments: Pleasant.  Cooperative with exam.  HENT:     Head: Normocephalic and atraumatic.     Right Ear: Tympanic membrane, ear canal and external ear normal. No drainage, swelling or tenderness. Tympanic membrane is not injected, scarred, erythematous, retracted or bulging.     Left Ear: Tympanic membrane, ear canal and external ear normal. No drainage, swelling or tenderness. Tympanic membrane is not injected, scarred, erythematous, retracted or bulging.     Nose: No nasal deformity, septal deviation, mucosal edema or rhinorrhea.     Right Turbinates: Enlarged, swollen and pale.     Left Turbinates: Enlarged, swollen and pale.     Right Sinus: No maxillary sinus tenderness or frontal sinus tenderness.     Left Sinus: No maxillary sinus tenderness or frontal sinus tenderness.     Comments: No polyps.    Mouth/Throat:     Lips: Pink.     Mouth: Mucous membranes are moist. Mucous membranes are not pale and not dry.     Pharynx: Uvula midline.  Eyes:     General: Lids are normal. Allergic shiner present.        Right eye: No discharge.        Left eye: No discharge.     Conjunctiva/sclera: Conjunctivae normal.     Right eye: Right conjunctiva is not injected. No chemosis.    Left eye: Left conjunctiva is not injected. No chemosis.    Pupils: Pupils are equal, round, and reactive to light.  Cardiovascular:     Rate and Rhythm: Normal rate and regular rhythm.     Heart sounds: Normal heart sounds.  Pulmonary:     Effort: Pulmonary effort is normal. No tachypnea, accessory muscle usage or respiratory distress.     Breath sounds: Examination of the  right-lower field reveals decreased breath sounds. Examination of the left-lower field reveals decreased breath sounds. Decreased breath sounds present. No wheezing, rhonchi or rales.     Comments: Decreased air movement at the bases. Chest:     Chest wall: No tenderness.  Abdominal:     Tenderness: There is no abdominal tenderness. There is no guarding or rebound.  Lymphadenopathy:     Head:     Right  side of head: No submandibular, tonsillar or occipital adenopathy.     Left side of head: No submandibular, tonsillar or occipital adenopathy.     Cervical: No cervical adenopathy.  Skin:    Coloration: Skin is not pale.     Findings: No abrasion, erythema, petechiae or rash. Rash is not papular, urticarial or vesicular.  Neurological:     Mental Status: She is alert.  Psychiatric:        Behavior: Behavior is cooperative.      Diagnostic studies:    Spirometry: results abnormal (FEV1: 2.07/71%, FVC: 2.50/72%, FEV1/FVC: 83%).    Spirometry consistent with possible restrictive disease.   Allergy  Studies: none       Drexel Gentles, MD  [facility] 

## 2023-06-24 NOTE — Patient Instructions (Addendum)
 1. Moderate persistent asthma, uncomplicated - Lung testing looked good today. - I will try to figure out the shower chair thing (I will talk to your PCP about this). - I do want you do to see Cardiology as well due to shortness of breath. - Referral made.  - Daily controller medication(s): Singulair  10mg  daily and Breztri  two puffs twice daily with spacer - Prior to physical activity: albuterol  2 puffs 10-15 minutes before physical activity. - Rescue medications: albuterol  4 puffs every 4-6 hours as needed - Asthma control goals:  * Full participation in all desired activities (may need albuterol  before activity) * Albuterol  use two time or less a week on average (not counting use with activity) * Cough interfering with sleep two time or less a month * Oral steroids no more than once a year * No hospitalizations  2. Seasonal and perennial allergic rhinitis (dust mites, cats, dogs, cockroach, trees, weeds, grass, mouse, and horse) - Call the [facility] office before you restart the allergy  shots (since they might have to be remixed).  - Continue with: hydroxyzine  one tablet twice daily, Singulair  (montelukast ) 10mg  daily, Flonase  (fluticasone ) two sprays per nostril daily - You can use an extra dose of the antihistamine, if needed, for breakthrough symptoms.  - Continue with nasal saline rinses as needed.    3. Eczema - Continue with the hydrocortisone  as needed.   4. GERD  - Continue with omeprazole  40mg  BID. - Continue with famotidine  20mg  twice daily as well.   5. Return in about 6 months (around 12/25/2023). You can have the follow up appointment with Dr. Idolina Maker or a Nurse Practicioner (our Nurse Practitioners are excellent and always have Physician oversight!).    Please inform us  of any Emergency Department visits, hospitalizations, or changes in symptoms. Call us  before going to the ED for breathing or allergy  symptoms since we might be able to fit you in for a sick visit. Feel free  to contact us  anytime with any questions, problems, or concerns.  It was a pleasure to see you again today!  Websites that have reliable patient information: 1. American Academy of Asthma, Allergy , and Immunology: www.aaaai.org 2. Food Allergy  Research and Education (FARE): foodallergy.org 3. Mothers of Asthmatics: http://www.asthmacommunitynetwork.org 4. [employer] of Allergy , Asthma, and Immunology: www.acaai.org      "Like" us  on Facebook and Instagram for our latest updates!      A healthy democracy works best when [employer] participate! Make sure you are registered to vote! If you have moved or changed any of your contact information, you will need to get this updated before voting! Scan the QR codes below to learn more!

## 2023-06-25 ENCOUNTER — Encounter: Payer: Self-pay | Admitting: Allergy & Immunology

## 2023-06-25 ENCOUNTER — Other Ambulatory Visit (HOSPITAL_COMMUNITY): Payer: Self-pay

## 2023-06-25 ENCOUNTER — Telehealth: Payer: Self-pay | Admitting: Allergy & Immunology

## 2023-06-25 MED ORDER — PROMETHAZINE HCL 25 MG PO TABS
25.0000 mg | ORAL_TABLET | Freq: Four times a day (QID) | ORAL | 0 refills | Status: DC | PRN
Start: 2023-06-25 — End: 2023-11-04

## 2023-06-25 NOTE — Progress Notes (Signed)
 Specialty Pharmacy Refill Coordination Note  [name] is a 38 y.o. female contacted today regarding refills of specialty medication(s) Dupilumab  (Dupixent )   Patient requested Delivery   Delivery date: 07/06/23   Verified address: [address] [place], [place] [address]   Medication will be filled on 07/05/23.

## 2023-06-25 NOTE — Progress Notes (Signed)
 Specialty Pharmacy Ongoing Clinical Assessment Note  [name] is a 38 y.o. female who is being followed by the specialty pharmacy service for RxSp Asthma/COPD   Patient's specialty medication(s) reviewed today: Dupilumab  (Dupixent )   Missed doses in the last 4 weeks: All (Patient reports that she was sick with pneumonia and had to hold doses for several months.)   Patient reports that she has discussed restarting her Dupixent  with her provider's office who instructed her to restart with a loading dose of 600 mg (2 injections) and then restart her maintenance dosing of 300 mg every 2 weeks.   Patient/Caregiver did not have any additional questions or concerns.   Therapeutic benefit summary: Patient is achieving benefit   Adverse events/side effects summary: No adverse events/side effects   Patient's therapy is appropriate to: Continue    Goals Addressed             This Visit's Progress    Reduce disease symptoms including coughing and shortness of breath       Patient is initiating therapy. Patient will maintain adherence         Follow up: 6 months  Malachi Screws Specialty Pharmacist

## 2023-06-25 NOTE — Telephone Encounter (Signed)
 LVM updating change in prescription.

## 2023-06-25 NOTE — Telephone Encounter (Signed)
 [name] called and stated she cannot take the Zofran  prescribed for nausea, as it does not go with another medication she is taking. She is requesting per the pharmacist that [facility]   be called in for her instead. She states that she needs to be able to pick this up tomorrow so please try and get in today. [employer] [employer] in [place].

## 2023-06-25 NOTE — Telephone Encounter (Signed)
 She said she was taking Zofran  before, but ok. I sent in the alternative with no refills.

## 2023-07-02 LAB — CBC WITH DIFF/PLATELET
Basophils Absolute: 0.1 10*3/uL (ref 0.0–0.2)
Basos: 1 %
EOS (ABSOLUTE): 0.2 10*3/uL (ref 0.0–0.4)
Eos: 2 %
Hematocrit: 44.7 % (ref 34.0–46.6)
Hemoglobin: 15 g/dL (ref 11.1–15.9)
Immature Grans (Abs): 0 10*3/uL (ref 0.0–0.1)
Immature Granulocytes: 0 %
Lymphocytes Absolute: 2.3 10*3/uL (ref 0.7–3.1)
Lymphs: 29 %
MCH: 29.4 pg (ref 26.6–33.0)
MCHC: 33.6 g/dL (ref 31.5–35.7)
MCV: 88 fL (ref 79–97)
Monocytes Absolute: 0.4 10*3/uL (ref 0.1–0.9)
Monocytes: 5 %
Neutrophils Absolute: 5 10*3/uL (ref 1.4–7.0)
Neutrophils: 63 %
Platelets: 325 10*3/uL (ref 150–450)
RBC: 5.11 x10E6/uL (ref 3.77–5.28)
RDW: 12 % (ref 11.7–15.4)
WBC: 8 10*3/uL (ref 3.4–10.8)

## 2023-07-02 LAB — COMPLEMENT, TOTAL: Compl, Total (CH50): 57 U/mL (ref >41)

## 2023-07-02 LAB — STREP PNEUMONIAE 23 SEROTYPES IGG
Pneumo Ab Type 1*: 0.4 ug/mL — ABNORMAL LOW (ref >1.3)
Pneumo Ab Type 12 (12F)*: 0.2 ug/mL — ABNORMAL LOW (ref >1.3)
Pneumo Ab Type 14*: 0.8 ug/mL — ABNORMAL LOW (ref >1.3)
Pneumo Ab Type 17 (17F)*: 0.6 ug/mL — ABNORMAL LOW (ref >1.3)
Pneumo Ab Type 19 (19F)*: 0.6 ug/mL — ABNORMAL LOW (ref >1.3)
Pneumo Ab Type 2*: 0.6 ug/mL — ABNORMAL LOW (ref >1.3)
Pneumo Ab Type 20*: 0.6 ug/mL — ABNORMAL LOW (ref >1.3)
Pneumo Ab Type 22 (22F)*: 0.3 ug/mL — ABNORMAL LOW (ref >1.3)
Pneumo Ab Type 23 (23F)*: 0.3 ug/mL — ABNORMAL LOW (ref >1.3)
Pneumo Ab Type 26 (6B)*: 0.2 ug/mL — ABNORMAL LOW (ref >1.3)
Pneumo Ab Type 3*: 0.1 ug/mL — ABNORMAL LOW (ref >1.3)
Pneumo Ab Type 34 (10A)*: 0.9 ug/mL — ABNORMAL LOW (ref >1.3)
Pneumo Ab Type 4*: 0.2 ug/mL — ABNORMAL LOW (ref >1.3)
Pneumo Ab Type 43 (11A)*: 0.2 ug/mL — ABNORMAL LOW (ref >1.3)
Pneumo Ab Type 5*: 0.3 ug/mL — ABNORMAL LOW (ref >1.3)
Pneumo Ab Type 51 (7F)*: 0.8 ug/mL — ABNORMAL LOW (ref >1.3)
Pneumo Ab Type 54 (15B)*: 0.5 ug/mL — ABNORMAL LOW (ref >1.3)
Pneumo Ab Type 56 (18C)*: 1.2 ug/mL — ABNORMAL LOW (ref >1.3)
Pneumo Ab Type 57 (19A)*: 0.8 ug/mL — ABNORMAL LOW (ref >1.3)
Pneumo Ab Type 68 (9V)*: 0.2 ug/mL — ABNORMAL LOW (ref >1.3)
Pneumo Ab Type 70 (33F)*: 0.5 ug/mL — ABNORMAL LOW (ref >1.3)
Pneumo Ab Type 8*: 2.2 ug/mL (ref >1.3)
Pneumo Ab Type 9 (9N)*: 0.3 ug/mL — ABNORMAL LOW (ref >1.3)

## 2023-07-02 LAB — DIPHTHERIA / TETANUS ANTIBODY PANEL
Diphtheria Ab: 0.65 [IU]/mL (ref <0.10)
Tetanus Ab, IgG: 2.54 [IU]/mL (ref <0.10)

## 2023-07-02 LAB — IGG, IGA, IGM
IgA/Immunoglobulin A, Serum: 109 mg/dL (ref 87–352)
IgG (Immunoglobin G), Serum: 667 mg/dL (ref 586–1602)
IgM (Immunoglobulin M), Srm: 113 mg/dL (ref 26–217)

## 2023-07-05 ENCOUNTER — Other Ambulatory Visit: Payer: Self-pay

## 2023-07-06 ENCOUNTER — Ambulatory Visit

## 2023-07-06 ENCOUNTER — Ambulatory Visit: Payer: Self-pay | Admitting: Allergy & Immunology

## 2023-07-08 ENCOUNTER — Telehealth: Payer: Self-pay | Admitting: Internal Medicine

## 2023-07-08 NOTE — Telephone Encounter (Signed)
 Ok for nurse visit for Prevnar 20    thanks

## 2023-07-08 NOTE — Telephone Encounter (Signed)
 Copied from CRM [id number]. Topic: Clinical - Request for Lab/Test Order >> Jul 07, 2023  4:03 PM Abigail D wrote: Reason for CRM: Patient had blood work from allergy /asthma and was advised that she needs needs pneumovax vaccine. She would like order put in, please call/message when order is ready to schedule.

## 2023-07-08 NOTE — Telephone Encounter (Signed)
 Called and left voicemail for Pt to call back and schedule nurse visit.

## 2023-07-13 ENCOUNTER — Other Ambulatory Visit: Payer: Self-pay

## 2023-07-21 ENCOUNTER — Other Ambulatory Visit (HOSPITAL_COMMUNITY): Payer: Self-pay

## 2023-07-22 ENCOUNTER — Other Ambulatory Visit (HOSPITAL_COMMUNITY): Payer: Self-pay

## 2023-07-22 NOTE — Progress Notes (Signed)
 CC declined on 07/05/23. [facility] left a voicemail for updated payment information on 6/2 and 6/10. No response as of 07/22/23. Returning to [place].

## 2023-07-30 ENCOUNTER — Other Ambulatory Visit (HOSPITAL_COMMUNITY): Payer: Self-pay

## 2023-08-01 ENCOUNTER — Other Ambulatory Visit: Payer: Self-pay | Admitting: Internal Medicine

## 2023-08-02 ENCOUNTER — Other Ambulatory Visit: Payer: Self-pay

## 2023-08-03 ENCOUNTER — Other Ambulatory Visit: Payer: Self-pay

## 2023-08-05 ENCOUNTER — Other Ambulatory Visit: Payer: Self-pay

## 2023-08-23 ENCOUNTER — Ambulatory Visit

## 2023-09-09 ENCOUNTER — Telehealth: Admitting: Physician Assistant

## 2023-09-09 DIAGNOSIS — L03211 Cellulitis of face: Secondary | ICD-10-CM

## 2023-09-09 MED ORDER — HYDROCORTISONE 1 % EX OINT: 1.0000 | TOPICAL_OINTMENT | Freq: Two times a day (BID) | CUTANEOUS | 0 refills | Status: AC

## 2023-09-09 MED ORDER — SULFAMETHOXAZOLE-TRIMETHOPRIM 800-160 MG PO TABS: 1.0000 | ORAL_TABLET | Freq: Two times a day (BID) | ORAL | 0 refills | Status: AC

## 2023-09-09 MED ORDER — FLUCONAZOLE 150 MG PO TABS: 150.0000 mg | ORAL_TABLET | ORAL | 0 refills | Status: DC

## 2023-09-09 NOTE — Patient Instructions (Addendum)
 [name], thank you for joining Teena Shuck, PA-C for today's virtual visit.  While this provider is not your primary care provider (PCP), if your PCP is located in our provider database this encounter information will be shared with them immediately following your visit.   A [facility] MyChart account gives you access to today's visit and all your visits, tests, and labs performed at [facility]  click here if you don't have a [facility] MyChart account or go to [email or web address]  Consent: (Patient) [name] provided verbal consent for this virtual visit at the beginning of the encounter.  Current Medications:  Current Outpatient Medications:    albuterol  (PROVENTIL ) (2.5 MG/3ML) 0.083% nebulizer solution, Inhale 1 vial (2.5 mg) via nebulizer every 4-6 as needed for cough, wheeze, shortness of breath and chest tightness., Disp: 75 mL, Rfl: 2   albuterol  (VENTOLIN  HFA) 108 (90 Base) MCG/ACT inhaler, Inhale 2 puffs into the lungs every 4 (four) hours as needed for wheezing or shortness of breath., Disp: 18 g, Rfl: 1   amLODipine  (NORVASC ) 5 MG tablet, Take 1 tablet (5 mg total) by mouth daily., Disp: 90 tablet, Rfl: 3   Blood Pressure Monitoring (BLOOD PRESSURE CUFF) MISC, 1 Device by Does not apply route daily as needed., Disp: 1 each, Rfl: 0   calcium  carbonate (OS-CAL - DOSED IN MG OF ELEMENTAL CALCIUM ) 1250 (500 Ca) MG tablet, Take 1 tablet (1,250 mg total) by mouth daily with breakfast., Disp: 30 tablet, Rfl: 0   cetirizine  (ZYRTEC ) 10 MG tablet, Take 1 tablet (10 mg total) by mouth daily., Disp: 90 tablet, Rfl: 1   clindamycin (CLEOCIN) 300 MG capsule, Take 300 mg by mouth 3 (three) times daily., Disp: , Rfl:    clonazePAM  (KLONOPIN ) 1 MG tablet, Take 1 tablet (1 mg total) by mouth 2 (two) times daily as needed for anxiety., Disp: 60 tablet, Rfl: 2   dupilumab  (DUPIXENT ) 300 MG/2ML prefilled syringe, Inject 300 mg into the skin every 14 (fourteen)  days., Disp: 4 mL, Rfl: 11   EPINEPHrine  (EPIPEN  2-PAK) 0.3 mg/0.3 mL IJ SOAJ injection, Inject 0.3 mg into the muscle as needed for anaphylaxis., Disp: 2 each, Rfl: 2   famotidine  (PEPCID ) 20 MG tablet, Take 1 tablet (20 mg total) by mouth 2 (two) times daily., Disp: 180 tablet, Rfl: 1   ferrous sulfate  325 (65 FE) MG tablet, Take 1 tablet (325 mg total) by mouth daily., Disp: 30 tablet, Rfl: 0   FLUoxetine  (PROZAC ) 20 MG capsule, Take 1 capsule (20 mg total) by mouth daily., Disp: 90 capsule, Rfl: 3   fluticasone  (FLONASE ) 50 MCG/ACT nasal spray, Place 2 sprays into both nostrils daily., Disp: 48 mL, Rfl: 3   fluticasone -salmeterol (ADVAIR HFA) 115-21 MCG/ACT inhaler, Inhale 2 puffs into the lungs 2 (two) times daily., Disp: 1 each, Rfl: 5   gabapentin  (NEURONTIN ) 300 MG capsule, Take 300 mg by mouth 3 (three) times daily., Disp: , Rfl:    hydrOXYzine  (ATARAX ) 50 MG tablet, Take 1 tablet (50 mg total) by mouth 2 (two) times daily as needed for anxiety., Disp: 60 tablet, Rfl: 1   montelukast  (SINGULAIR ) 10 MG tablet, Take 1 tablet (10 mg total) by mouth at bedtime., Disp: 90 tablet, Rfl: 1   Multiple Vitamin (MULTIVITAMIN) tablet, Take 1 tablet by mouth daily. Women's Daily Vitamin, Disp: , Rfl:    nicotine  (NICODERM CQ  - DOSED IN MG/24 HOURS) 14 mg/24hr patch, Place 1 patch (14 mg total) onto the skin daily.,  Disp: 28 patch, Rfl: 5   OLANZapine  zydis (ZYPREXA ) 5 MG disintegrating tablet, Take 1 tablet (5 mg total) by mouth at bedtime. (Patient taking differently: Take 10 mg by mouth at bedtime.), Disp: 30 tablet, Rfl: 0   omeprazole  (PRILOSEC) 40 MG capsule, Take 1 capsule (40 mg total) by mouth in the morning and at bedtime., Disp: 180 capsule, Rfl: 1   ondansetron  (ZOFRAN -ODT) 8 MG disintegrating tablet, Take 1 tablet (8 mg total) by mouth every 8 (eight) hours as needed for nausea or vomiting., Disp: 20 tablet, Rfl: 0   OXcarbazepine  (TRILEPTAL ) 300 MG tablet, Take 1 tablet (300 mg total) by  mouth 2 (two) times daily., Disp: 60 tablet, Rfl: 1   paliperidone  (INVEGA ) 6 MG 24 hr tablet, Take 1 tablet (6 mg total) by mouth daily., Disp: 30 tablet, Rfl: 1   Potassium 99 MG TABS, Take 99 mg by mouth daily., Disp: , Rfl:    prazosin  (MINIPRESS ) 1 MG capsule, Take 1 capsule (1 mg total) by mouth as directed. TAKE ONE IN THE AM AND 3 AT NIGHT, Disp: 120 capsule, Rfl: 0   promethazine  (PHENERGAN ) 25 MG tablet, Take 1 tablet (25 mg total) by mouth every 6 (six) hours as needed for nausea or vomiting., Disp: 30 tablet, Rfl: 0   Respiratory Therapy Supplies (NEBULIZER/TUBING/MOUTHPIECE) KIT, Inhale 1 vial via nebulizer every 4-6 hours as needed for cough, wheeze, shortness of breath and chest tighness., Disp: 1 kit, Rfl: 0   thiamine  (VITAMIN B1) 100 MG tablet, Take 100 mg by mouth daily., Disp: , Rfl:    Tiotropium Bromide Monohydrate  (SPIRIVA  RESPIMAT) 1.25 MCG/ACT AERS, Inhale 2 puffs into the lungs daily., Disp: 12 g, Rfl: 1   traZODone  (DESYREL ) 50 MG tablet, Take 0.5 tablets (25 mg total) by mouth at bedtime as needed for sleep., Disp: 15 tablet, Rfl: 1   Zinc  50 MG TABS, Take 50 mg by mouth daily., Disp: , Rfl:    Medications ordered in this encounter:  No orders of the defined types were placed in this encounter.    *If you need refills on other medications prior to your next appointment, please contact your pharmacy*  Follow-Up: Call back or seek an in-person evaluation if the symptoms worsen or if the condition fails to improve as anticipated.  [facility] [phone]  Other Instructions Please report to the nearest Emergency room with any worsening symptoms. Follow up with primary care provider (PCP) in 2 -3 days.   If you have been instructed to have an in-person evaluation today at a local Urgent Care facility, please use the link below. It will take you to a list of all of our available [facility], including address, phone number and hours of  operation. Please do not delay care.  [facility]  If you or a family member do not have a primary care provider, use the link below to schedule a visit and establish care. When you choose a [facility] primary care physician or advanced practice provider, you gain a long-term partner in health. Find a Primary Care Provider  Learn more about [facility] in-office and virtual care options: [facility] Get Care Now

## 2023-09-09 NOTE — Progress Notes (Signed)
 Virtual Visit Consent   [name], you are scheduled for a virtual visit with a [facility] provider today. Just as with appointments in the office, your consent must be obtained to participate. Your consent will be active for this visit and any virtual visit you may have with one of our providers in the next 365 days. If you have a MyChart account, a copy of this consent can be sent to you electronically.  As this is a virtual visit, video technology does not allow for your provider to perform a traditional examination. This may limit your provider's ability to fully assess your condition. If your provider identifies any concerns that need to be evaluated in person or the need to arrange testing (such as labs, EKG, etc.), we will make arrangements to do so. Although advances in technology are sophisticated, we cannot ensure that it will always work on either your end or our end. If the connection with a video visit is poor, the visit may have to be switched to a telephone visit. With either a video or telephone visit, we are not always able to ensure that we have a secure connection.  By engaging in this virtual visit, you consent to the provision of healthcare and authorize for your insurance to be billed (if applicable) for the services provided during this visit. Depending on your insurance coverage, you may receive a charge related to this service.  I need to obtain your verbal consent now. Are you willing to proceed with your visit today? [name] has provided verbal consent on 09/09/2023 for a virtual visit (video or telephone). Teena Shuck, [place]  Date: 09/09/2023 5:47 PM   Virtual Visit via Video Note   I, Teena Shuck, connected with  [name]  ([id number], 1985-08-24) on 09/09/23 at  5:45 PM EDT by a video-enabled telemedicine application and verified that I am speaking with the correct person using two identifiers.  Location: Patient: Virtual Visit Location Patient:  Home Provider: Virtual Visit Location Provider: Home Office   I discussed the limitations of evaluation and management by telemedicine and the availability of in person appointments. The patient expressed understanding and agreed to proceed.    History of Present Illness: [name] is a 38 y.o. who identifies as a female who was assigned female at birth, and is being seen today for piercing infections  HPI: Rash This is a recurrent problem. The affected locations include the face. The rash is characterized by redness and scaling. She was exposed to nothing. Pertinent negatives include no anorexia, congestion, cough, diarrhea, eye pain, facial edema, fatigue, fever, joint pain, nail changes, rhinorrhea, shortness of breath, sore throat or vomiting. Past treatments include nothing. The treatment provided no relief.    Problems:  Patient Active Problem List   Diagnosis Date Noted   Panic 11/14/2022   Abscess of multiple sites 11/09/2022   Smoker 03/20/2022   HTN (hypertension) 03/20/2022   Vaginitis 03/20/2022   Suicide ideation 02/18/2022   Substance abuse (HCC) 02/18/2022   Bipolar 1 disorder, manic, full remission (HCC) 02/18/2022   Abscess of external cheek, left 01/01/2022   Rash 12/25/2021   Asthma exacerbation 11/26/2021   STD exposure 11/26/2021   Insect bite 11/26/2021   Bipolar affective disorder (HCC) 11/13/2021   Encounter for well adult exam with abnormal findings 11/12/2021   Cellulitis 11/12/2021   Vaginal discharge 08/26/2021   BRBPR (bright red blood per rectum) 02/27/2021   Anal fissure 02/27/2021  Facial abscess 02/12/2021   Folliculitis 12/21/2020   Bipolar 2 disorder, major depressive episode (HCC) 12/20/2020   Vitamin D  deficiency 07/15/2020   B12 deficiency 07/15/2020   MDD (major depressive disorder), recurrent severe, without psychosis (HCC) 04/26/2020   PTSD (post-traumatic stress disorder) 01/03/2020   Allergic asthma 01/13/2018   Allergic  rhinitis 03/12/2017   Class 2 obesity due to excess calories without serious comorbidity with body mass index (BMI) of 37.0 to 37.9 in adult 04/17/2016   Acute asthmatic bronchitis 03/13/2016   Borderline personality disorder in adult [facility]) 03/13/2016   Mood disorder (HCC) 03/04/2013    Allergies:  Allergies  Allergen Reactions   Benztropine Other (See Comments)    agitation     Betadine [Povidone Iodine] Swelling   Other Other (See Comments)    Opiates- history of dependency    Lamictal [Lamotrigine] Rash   Medications:  Current Outpatient Medications:    albuterol  (PROVENTIL ) (2.5 MG/3ML) 0.083% nebulizer solution, Inhale 1 vial (2.5 mg) via nebulizer every 4-6 as needed for cough, wheeze, shortness of breath and chest tightness., Disp: 75 mL, Rfl: 2   albuterol  (VENTOLIN  HFA) 108 (90 Base) MCG/ACT inhaler, Inhale 2 puffs into the lungs every 4 (four) hours as needed for wheezing or shortness of breath., Disp: 18 g, Rfl: 1   amLODipine  (NORVASC ) 5 MG tablet, Take 1 tablet (5 mg total) by mouth daily., Disp: 90 tablet, Rfl: 3   Blood Pressure Monitoring (BLOOD PRESSURE CUFF) MISC, 1 Device by Does not apply route daily as needed., Disp: 1 each, Rfl: 0   calcium  carbonate (OS-CAL - DOSED IN MG OF ELEMENTAL CALCIUM ) 1250 (500 Ca) MG tablet, Take 1 tablet (1,250 mg total) by mouth daily with breakfast., Disp: 30 tablet, Rfl: 0   cetirizine  (ZYRTEC ) 10 MG tablet, Take 1 tablet (10 mg total) by mouth daily., Disp: 90 tablet, Rfl: 1   clindamycin (CLEOCIN) 300 MG capsule, Take 300 mg by mouth 3 (three) times daily., Disp: , Rfl:    clonazePAM  (KLONOPIN ) 1 MG tablet, Take 1 tablet (1 mg total) by mouth 2 (two) times daily as needed for anxiety., Disp: 60 tablet, Rfl: 2   dupilumab  (DUPIXENT ) 300 MG/2ML prefilled syringe, Inject 300 mg into the skin every 14 (fourteen) days., Disp: 4 mL, Rfl: 11   EPINEPHrine  (EPIPEN  2-PAK) 0.3 mg/0.3 mL IJ SOAJ injection, Inject 0.3 mg into the muscle as  needed for anaphylaxis., Disp: 2 each, Rfl: 2   famotidine  (PEPCID ) 20 MG tablet, Take 1 tablet (20 mg total) by mouth 2 (two) times daily., Disp: 180 tablet, Rfl: 1   ferrous sulfate  325 (65 FE) MG tablet, Take 1 tablet (325 mg total) by mouth daily., Disp: 30 tablet, Rfl: 0   FLUoxetine  (PROZAC ) 20 MG capsule, Take 1 capsule (20 mg total) by mouth daily., Disp: 90 capsule, Rfl: 3   fluticasone  (FLONASE ) 50 MCG/ACT nasal spray, Place 2 sprays into both nostrils daily., Disp: 48 mL, Rfl: 3   fluticasone -salmeterol (ADVAIR HFA) 115-21 MCG/ACT inhaler, Inhale 2 puffs into the lungs 2 (two) times daily., Disp: 1 each, Rfl: 5   gabapentin  (NEURONTIN ) 300 MG capsule, Take 300 mg by mouth 3 (three) times daily., Disp: , Rfl:    hydrOXYzine  (ATARAX ) 50 MG tablet, Take 1 tablet (50 mg total) by mouth 2 (two) times daily as needed for anxiety., Disp: 60 tablet, Rfl: 1   montelukast  (SINGULAIR ) 10 MG tablet, Take 1 tablet (10 mg total) by mouth at bedtime., Disp: 90 tablet,  Rfl: 1   Multiple Vitamin (MULTIVITAMIN) tablet, Take 1 tablet by mouth daily. Women's Daily Vitamin, Disp: , Rfl:    nicotine  (NICODERM CQ  - DOSED IN MG/24 HOURS) 14 mg/24hr patch, Place 1 patch (14 mg total) onto the skin daily., Disp: 28 patch, Rfl: 5   OLANZapine  zydis (ZYPREXA ) 5 MG disintegrating tablet, Take 1 tablet (5 mg total) by mouth at bedtime. (Patient taking differently: Take 10 mg by mouth at bedtime.), Disp: 30 tablet, Rfl: 0   omeprazole  (PRILOSEC) 40 MG capsule, Take 1 capsule (40 mg total) by mouth in the morning and at bedtime., Disp: 180 capsule, Rfl: 1   ondansetron  (ZOFRAN -ODT) 8 MG disintegrating tablet, Take 1 tablet (8 mg total) by mouth every 8 (eight) hours as needed for nausea or vomiting., Disp: 20 tablet, Rfl: 0   OXcarbazepine  (TRILEPTAL ) 300 MG tablet, Take 1 tablet (300 mg total) by mouth 2 (two) times daily., Disp: 60 tablet, Rfl: 1   paliperidone  (INVEGA ) 6 MG 24 hr tablet, Take 1 tablet (6 mg total) by  mouth daily., Disp: 30 tablet, Rfl: 1   Potassium 99 MG TABS, Take 99 mg by mouth daily., Disp: , Rfl:    prazosin  (MINIPRESS ) 1 MG capsule, Take 1 capsule (1 mg total) by mouth as directed. TAKE ONE IN THE AM AND 3 AT NIGHT, Disp: 120 capsule, Rfl: 0   promethazine  (PHENERGAN ) 25 MG tablet, Take 1 tablet (25 mg total) by mouth every 6 (six) hours as needed for nausea or vomiting., Disp: 30 tablet, Rfl: 0   Respiratory Therapy Supplies (NEBULIZER/TUBING/MOUTHPIECE) KIT, Inhale 1 vial via nebulizer every 4-6 hours as needed for cough, wheeze, shortness of breath and chest tighness., Disp: 1 kit, Rfl: 0   thiamine  (VITAMIN B1) 100 MG tablet, Take 100 mg by mouth daily., Disp: , Rfl:    Tiotropium Bromide Monohydrate  (SPIRIVA  RESPIMAT) 1.25 MCG/ACT AERS, Inhale 2 puffs into the lungs daily., Disp: 12 g, Rfl: 1   traZODone  (DESYREL ) 50 MG tablet, Take 0.5 tablets (25 mg total) by mouth at bedtime as needed for sleep., Disp: 15 tablet, Rfl: 1   Zinc  50 MG TABS, Take 50 mg by mouth daily., Disp: , Rfl:   Observations/Objective: Patient is well-developed, well-nourished in no acute distress.  Resting comfortably  at home.  Head is normocephalic, atraumatic.  No labored breathing.  Speech is clear and coherent with logical content.  Patient is alert and oriented at baseline.  Mild erythema underarm Erythema at bottom lip, right and left cheek  Assessment and Plan: 1. Cellulitis of face (Primary)  Patient presenting with obvious staph infection secondary to facial piercing's. Also with noted underarm irritation. She denies shaving this area however I advised topical hydrocortisone  in the interim. Her cellulitis secondary to infected piercing will be treated with bactrim . All questions answered. Patient is in agreement with this plan.  Follow Up Instructions: I discussed the assessment and treatment plan with the patient. The patient was provided an opportunity to ask questions and all were answered.  The patient agreed with the plan and demonstrated an understanding of the instructions.  A copy of instructions were sent to the patient via MyChart unless otherwise noted below.    The patient was advised to call back or seek an in-person evaluation if the symptoms worsen or if the condition fails to improve as anticipated.    Teena Shuck, PA-C

## 2023-09-24 ENCOUNTER — Telehealth: Admitting: Physician Assistant

## 2023-09-24 DIAGNOSIS — L989 Disorder of the skin and subcutaneous tissue, unspecified: Secondary | ICD-10-CM | POA: Diagnosis not present

## 2023-09-24 MED ORDER — FLUCONAZOLE 150 MG PO TABS: 150.0000 mg | ORAL_TABLET | Freq: Once | ORAL | 0 refills | Status: AC

## 2023-09-24 MED ORDER — CEPHALEXIN 500 MG PO CAPS: 500.0000 mg | ORAL_CAPSULE | Freq: Four times a day (QID) | ORAL | 0 refills | Status: AC

## 2023-09-24 MED ORDER — BACITRACIN 500 UNIT/GM EX OINT: 1.0000 | TOPICAL_OINTMENT | Freq: Two times a day (BID) | CUTANEOUS | 0 refills | Status: AC

## 2023-09-24 NOTE — Patient Instructions (Signed)
 Maurilio [name], thank you for joining Lynden GORMAN Snuffer, PA-C for today's virtual visit.  While this provider is not your primary care provider (PCP), if your PCP is located in our provider database this encounter information will be shared with them immediately following your visit.   A [facility] MyChart account gives you access to today's visit and all your visits, tests, and labs performed at [facility]  click here if you don't have a [facility] MyChart account or go to [email or web address]  Consent: (Patient) [name] provided verbal consent for this virtual visit at the beginning of the encounter.  Current Medications:  Current Outpatient Medications:    bacitracin  500 UNIT/GM ointment, Apply 1 Application topically 2 (two) times daily for 7 days., Disp: 12.6 g, Rfl: 0   cephALEXin  (KEFLEX ) 500 MG capsule, Take 1 capsule (500 mg total) by mouth 4 (four) times daily for 5 days., Disp: 20 capsule, Rfl: 0   fluconazole  (DIFLUCAN ) 150 MG tablet, Take 1 tablet (150 mg total) by mouth once for 1 dose., Disp: 1 tablet, Rfl: 0   albuterol  (PROVENTIL ) (2.5 MG/3ML) 0.083% nebulizer solution, Inhale 1 vial (2.5 mg) via nebulizer every 4-6 as needed for cough, wheeze, shortness of breath and chest tightness., Disp: 75 mL, Rfl: 2   albuterol  (VENTOLIN  HFA) 108 (90 Base) MCG/ACT inhaler, Inhale 2 puffs into the lungs every 4 (four) hours as needed for wheezing or shortness of breath., Disp: 18 g, Rfl: 1   amLODipine  (NORVASC ) 5 MG tablet, Take 1 tablet (5 mg total) by mouth daily., Disp: 90 tablet, Rfl: 3   Blood Pressure Monitoring (BLOOD PRESSURE CUFF) MISC, 1 Device by Does not apply route daily as needed., Disp: 1 each, Rfl: 0   calcium  carbonate (OS-CAL - DOSED IN MG OF ELEMENTAL CALCIUM ) 1250 (500 Ca) MG tablet, Take 1 tablet (1,250 mg total) by mouth daily with breakfast., Disp: 30 tablet, Rfl: 0   cetirizine  (ZYRTEC ) 10 MG tablet, Take 1 tablet (10 mg total) by mouth  daily., Disp: 90 tablet, Rfl: 1   clindamycin (CLEOCIN) 300 MG capsule, Take 300 mg by mouth 3 (three) times daily., Disp: , Rfl:    clonazePAM  (KLONOPIN ) 1 MG tablet, Take 1 tablet (1 mg total) by mouth 2 (two) times daily as needed for anxiety., Disp: 60 tablet, Rfl: 2   dupilumab  (DUPIXENT ) 300 MG/2ML prefilled syringe, Inject 300 mg into the skin every 14 (fourteen) days., Disp: 4 mL, Rfl: 11   EPINEPHrine  (EPIPEN  2-PAK) 0.3 mg/0.3 mL IJ SOAJ injection, Inject 0.3 mg into the muscle as needed for anaphylaxis., Disp: 2 each, Rfl: 2   famotidine  (PEPCID ) 20 MG tablet, Take 1 tablet (20 mg total) by mouth 2 (two) times daily., Disp: 180 tablet, Rfl: 1   ferrous sulfate  325 (65 FE) MG tablet, Take 1 tablet (325 mg total) by mouth daily., Disp: 30 tablet, Rfl: 0   fluconazole  (DIFLUCAN ) 150 MG tablet, Take 1 tablet (150 mg total) by mouth every 3 (three) days., Disp: 1 tablet, Rfl: 0   FLUoxetine  (PROZAC ) 20 MG capsule, Take 1 capsule (20 mg total) by mouth daily., Disp: 90 capsule, Rfl: 3   fluticasone  (FLONASE ) 50 MCG/ACT nasal spray, Place 2 sprays into both nostrils daily., Disp: 48 mL, Rfl: 3   fluticasone -salmeterol (ADVAIR HFA) 115-21 MCG/ACT inhaler, Inhale 2 puffs into the lungs 2 (two) times daily., Disp: 1 each, Rfl: 5   gabapentin  (NEURONTIN ) 300 MG capsule, Take 300 mg by mouth 3 (three)  times daily., Disp: , Rfl:    hydrocortisone  1 % ointment, Apply 1 Application topically 2 (two) times daily. Apply to underarm, Disp: 30 g, Rfl: 0   hydrOXYzine  (ATARAX ) 50 MG tablet, Take 1 tablet (50 mg total) by mouth 2 (two) times daily as needed for anxiety., Disp: 60 tablet, Rfl: 1   montelukast  (SINGULAIR ) 10 MG tablet, Take 1 tablet (10 mg total) by mouth at bedtime., Disp: 90 tablet, Rfl: 1   Multiple Vitamin (MULTIVITAMIN) tablet, Take 1 tablet by mouth daily. Women's Daily Vitamin, Disp: , Rfl:    nicotine  (NICODERM CQ  - DOSED IN MG/24 HOURS) 14 mg/24hr patch, Place 1 patch (14 mg total) onto  the skin daily., Disp: 28 patch, Rfl: 5   OLANZapine  zydis (ZYPREXA ) 5 MG disintegrating tablet, Take 1 tablet (5 mg total) by mouth at bedtime. (Patient taking differently: Take 10 mg by mouth at bedtime.), Disp: 30 tablet, Rfl: 0   omeprazole  (PRILOSEC) 40 MG capsule, Take 1 capsule (40 mg total) by mouth in the morning and at bedtime., Disp: 180 capsule, Rfl: 1   ondansetron  (ZOFRAN -ODT) 8 MG disintegrating tablet, Take 1 tablet (8 mg total) by mouth every 8 (eight) hours as needed for nausea or vomiting., Disp: 20 tablet, Rfl: 0   OXcarbazepine  (TRILEPTAL ) 300 MG tablet, Take 1 tablet (300 mg total) by mouth 2 (two) times daily., Disp: 60 tablet, Rfl: 1   paliperidone  (INVEGA ) 6 MG 24 hr tablet, Take 1 tablet (6 mg total) by mouth daily., Disp: 30 tablet, Rfl: 1   Potassium 99 MG TABS, Take 99 mg by mouth daily., Disp: , Rfl:    prazosin  (MINIPRESS ) 1 MG capsule, Take 1 capsule (1 mg total) by mouth as directed. TAKE ONE IN THE AM AND 3 AT NIGHT, Disp: 120 capsule, Rfl: 0   promethazine  (PHENERGAN ) 25 MG tablet, Take 1 tablet (25 mg total) by mouth every 6 (six) hours as needed for nausea or vomiting., Disp: 30 tablet, Rfl: 0   Respiratory Therapy Supplies (NEBULIZER/TUBING/MOUTHPIECE) KIT, Inhale 1 vial via nebulizer every 4-6 hours as needed for cough, wheeze, shortness of breath and chest tighness., Disp: 1 kit, Rfl: 0   thiamine  (VITAMIN B1) 100 MG tablet, Take 100 mg by mouth daily., Disp: , Rfl:    Tiotropium Bromide Monohydrate  (SPIRIVA  RESPIMAT) 1.25 MCG/ACT AERS, Inhale 2 puffs into the lungs daily., Disp: 12 g, Rfl: 1   traZODone  (DESYREL ) 50 MG tablet, Take 0.5 tablets (25 mg total) by mouth at bedtime as needed for sleep., Disp: 15 tablet, Rfl: 1   Zinc  50 MG TABS, Take 50 mg by mouth daily., Disp: , Rfl:    Medications ordered in this encounter:  Meds ordered this encounter  Medications   bacitracin  500 UNIT/GM ointment    Sig: Apply 1 Application topically 2 (two) times daily for  7 days.    Dispense:  12.6 g    Refill:  0   cephALEXin  (KEFLEX ) 500 MG capsule    Sig: Take 1 capsule (500 mg total) by mouth 4 (four) times daily for 5 days.    Dispense:  20 capsule    Refill:  0   fluconazole  (DIFLUCAN ) 150 MG tablet    Sig: Take 1 tablet (150 mg total) by mouth once for 1 dose.    Dispense:  1 tablet    Refill:  0     *If you need refills on other medications prior to your next appointment, please contact your pharmacy*  Follow-Up:  Call back or seek an in-person evaluation if the symptoms worsen or if the condition fails to improve as anticipated.  [facility] [phone]  Other Instructions Use warm compresses 2-3 times daily and use bacitracin  twice daily for the next 7 days. If there is no improvement after 3 days then fill the prescription for keflex  to treat your skin infection   Follow up with your regular doctor in 1 week for reassessment and seek care sooner if your symptoms worsen or fail to improve.   If you have been instructed to have an in-person evaluation today at a local Urgent Care facility, please use the link below. It will take you to a list of all of our available [facility], including address, phone number and hours of operation. Please do not delay care.  [facility]  If you or a family member do not have a primary care provider, use the link below to schedule a visit and establish care. When you choose a [facility] primary care physician or advanced practice provider, you gain a long-term partner in health. Find a Primary Care Provider  Learn more about [facility] in-office and virtual care options: [facility] - Get Care Now

## 2023-09-24 NOTE — Progress Notes (Signed)
 [name], fix are scheduled for a virtual visit with your provider today.    Just as we do with appointments in the office, we must obtain your consent to participate.  Your consent will be active for this visit and any virtual visit you may have with one of our providers in the next 365 days.    If you have a MyChart account, I can also send a copy of this consent to you electronically.  All virtual visits are billed to your insurance company just like a traditional visit in the office.  As this is a virtual visit, video technology does not allow for your provider to perform a traditional examination.  This may limit your provider's ability to fully assess your condition.  If your provider identifies any concerns that need to be evaluated in person or the need to arrange testing such as labs, EKG, etc, we will make arrangements to do so.    Although advances in technology are sophisticated, we cannot ensure that it will always work on either your end or our end.  If the connection with a video visit is poor, we may have to switch to a telephone visit.  With either a video or telephone visit, we are not always able to ensure that we have a secure connection.   I need to obtain your verbal consent now.   Are you willing to proceed with your visit today?   [name] has provided verbal consent on 09/24/2023 for a virtual visit (video or telephone).   Lynden GORMAN Snuffer, [place] 09/24/2023  2:06 PM   Date:  09/24/2023   ID:  [name], DOB 1985/06/27, MRN [id number]  Patient Location: Home Provider Location: Home Office   Participants: Patient and Provider for Visit and Wrap up  Method of visit: Video  Location of Patient: Home Location of Provider: Home Office Consent was obtain for visit over the video. Services rendered by provider: Visit was performed via video  A video enabled telemedicine application was used and I verified that I am speaking with the correct person using two  identifiers.  PCP:  Norleen Lynwood ORN, MD   Chief Complaint:  skin problem  History of Present Illness:    [name] is a 38 y.o. female with history as stated below. Presents video telehealth for an acute care visit  Pt has an area of redness to the chest that started after she picked at it. The redness started 4-5 days ago. Denies fevers or chills. She feels her symptoms have worsened since onset.   Pt states that if an antibiotics are prescribed she requests fluconazole  as she has frequent yeast infections from antibiotic use  Past Medical, Surgical, Social History, Allergies, and Medications have been Reviewed.  Past Medical History:  Diagnosis Date   Allergy     Anxiety    Asthma    Bipolar 2 disorder (HCC)    Borderline personality disorder (HCC)    Depression    Eczema    Former smoker    Obesity    PTSD (post-traumatic stress disorder)    Urticaria     Current Meds  Medication Sig   bacitracin  500 UNIT/GM ointment Apply 1 Application topically 2 (two) times daily for 7 days.   cephALEXin  (KEFLEX ) 500 MG capsule Take 1 capsule (500 mg total) by mouth 4 (four) times daily for 5 days.   fluconazole  (DIFLUCAN ) 150 MG tablet Take 1 tablet (150 mg total) by mouth once for  1 dose.     Allergies:   Benztropine, Betadine [povidone iodine], Other, and Lamictal [lamotrigine]   ROS See HPI for history of present illness.  Physical Exam Constitutional:      Appearance: Normal appearance. She is not ill-appearing.  Skin:    Comments: 1cm area of erythema to the chest  Neurological:     Mental Status: She is alert.               MDM: Pt with small area of erythema to the chest. Given small area of erythema, feel that trial of warm compresses and topical bacitracin  is reasonable. Advised that if symptoms not improving or are worsening she can fill rx for kelfex. Fluconazole  was also sent due to h/o yeast vaginitis with abx   Tests Ordered: No orders of the defined  types were placed in this encounter.   Medication Changes: Meds ordered this encounter  Medications   bacitracin  500 UNIT/GM ointment    Sig: Apply 1 Application topically 2 (two) times daily for 7 days.    Dispense:  12.6 g    Refill:  0   cephALEXin  (KEFLEX ) 500 MG capsule    Sig: Take 1 capsule (500 mg total) by mouth 4 (four) times daily for 5 days.    Dispense:  20 capsule    Refill:  0   fluconazole  (DIFLUCAN ) 150 MG tablet    Sig: Take 1 tablet (150 mg total) by mouth once for 1 dose.    Dispense:  1 tablet    Refill:  0     Disposition:  Follow up  Signed, Caitlynn Ju GORMAN Snuffer, PA-C  09/24/2023 2:06 PM

## 2023-11-04 ENCOUNTER — Telehealth: Admitting: Physician Assistant

## 2023-11-04 DIAGNOSIS — K529 Noninfective gastroenteritis and colitis, unspecified: Secondary | ICD-10-CM | POA: Diagnosis not present

## 2023-11-04 MED ORDER — AZITHROMYCIN 500 MG PO TABS: 500.0000 mg | ORAL_TABLET | Freq: Every day | ORAL | 0 refills | Status: AC

## 2023-11-04 MED ORDER — PROMETHAZINE HCL 25 MG PO TABS: 25.0000 mg | ORAL_TABLET | Freq: Three times a day (TID) | ORAL | 0 refills | Status: AC | PRN

## 2023-11-04 NOTE — Progress Notes (Signed)
 Virtual Visit Consent   [name], you are scheduled for a virtual visit with a [place] provider today. Just as with appointments in the office, your consent must be obtained to participate. Your consent will be active for this visit and any virtual visit you may have with one of our providers in the next 365 days. If you have a MyChart account, a copy of this consent can be sent to you electronically.  As this is a virtual visit, video technology does not allow for your provider to perform a traditional examination. This may limit your provider's ability to fully assess your condition. If your provider identifies any concerns that need to be evaluated in person or the need to arrange testing (such as labs, EKG, etc.), we will make arrangements to do so. Although advances in technology are sophisticated, we cannot ensure that it will always work on either your end or our end. If the connection with a video visit is poor, the visit may have to be switched to a telephone visit. With either a video or telephone visit, we are not always able to ensure that we have a secure connection.  By engaging in this virtual visit, you consent to the provision of healthcare and authorize for your insurance to be billed (if applicable) for the services provided during this visit. Depending on your insurance coverage, you may receive a charge related to this service.  I need to obtain your verbal consent now. Are you willing to proceed with your visit today? [name] has provided verbal consent on 11/04/2023 for a virtual visit (video or telephone). [name], [place]  Date: 11/04/2023 7:11 PM   Virtual Visit via Video Note   I, [name], connected with  [name]  ([id number], 12/28/1985) on 11/04/23 at  7:00 PM EDT by a video-enabled telemedicine application and verified that I am speaking with the correct person using two identifiers.  Location: Patient: Virtual Visit  Location Patient: Home Provider: Virtual Visit Location Provider: Home Office   I discussed the limitations of evaluation and management by telemedicine and the availability of in person appointments. The patient expressed understanding and agreed to proceed.    History of Present Illness: [name] is a 38 y.o. who identifies as a female who was assigned female at birth, and is being seen today for frequent, loose stools over the past 3-4 associated with nausea and a few episodes of non-bloody emesis. Denies fever but notes chills. One episode of BRBPR but none since. Denies melena or hematochezia. Notes this started after drinking a drink that had been sitting out for a bout a week -- previously drank out of.  Denies recent travel or sick contact.  OTC -- Pepto Bismol, Imodium . Started old script for promethazine  for nausea which is helping.   HPI: HPI  Problems:  Patient Active Problem List   Diagnosis Date Noted   Panic 11/14/2022   Abscess of multiple sites 11/09/2022   Smoker 03/20/2022   HTN (hypertension) 03/20/2022   Vaginitis 03/20/2022   Suicide ideation 02/18/2022   Substance abuse (HCC) 02/18/2022   Bipolar 1 disorder, manic, full remission 02/18/2022   Abscess of external cheek, left 01/01/2022   Rash 12/25/2021   Asthma exacerbation 11/26/2021   STD exposure 11/26/2021   Insect bite 11/26/2021   Bipolar affective disorder (HCC) 11/13/2021   Encounter for well adult exam with abnormal findings 11/12/2021   Cellulitis 11/12/2021   Vaginal discharge  08/26/2021   BRBPR (bright red blood per rectum) 02/27/2021   Anal fissure 02/27/2021   Facial abscess 02/12/2021   Folliculitis 12/21/2020   Bipolar 2 disorder, major depressive episode (HCC) 12/20/2020   Vitamin D  deficiency 07/15/2020   B12 deficiency 07/15/2020   MDD (major depressive disorder), recurrent severe, without psychosis (HCC) 04/26/2020   PTSD (post-traumatic stress disorder) 01/03/2020   Allergic  asthma 01/13/2018   Allergic rhinitis 03/12/2017   Class 2 obesity due to excess calories without serious comorbidity with body mass index (BMI) of 37.0 to 37.9 in adult 04/17/2016   Acute asthmatic bronchitis 03/13/2016   Borderline personality disorder in adult [facility]) 03/13/2016   Mood disorder 03/04/2013    Allergies:  Allergies  Allergen Reactions   Benztropine Other (See Comments)    agitation     Betadine [Povidone Iodine] Swelling   Other Other (See Comments)    Opiates- history of dependency    Lamictal [Lamotrigine] Rash   Medications:  Current Outpatient Medications:    azithromycin  (ZITHROMAX ) 500 MG tablet, Take 1 tablet (500 mg total) by mouth daily for 3 days., Disp: 3 tablet, Rfl: 0   promethazine  (PHENERGAN ) 25 MG tablet, Take 1 tablet (25 mg total) by mouth every 8 (eight) hours as needed for nausea or vomiting., Disp: 20 tablet, Rfl: 0   albuterol  (PROVENTIL ) (2.5 MG/3ML) 0.083% nebulizer solution, Inhale 1 vial (2.5 mg) via nebulizer every 4-6 as needed for cough, wheeze, shortness of breath and chest tightness., Disp: 75 mL, Rfl: 2   albuterol  (VENTOLIN  HFA) 108 (90 Base) MCG/ACT inhaler, Inhale 2 puffs into the lungs every 4 (four) hours as needed for wheezing or shortness of breath., Disp: 18 g, Rfl: 1   amLODipine  (NORVASC ) 5 MG tablet, Take 1 tablet (5 mg total) by mouth daily., Disp: 90 tablet, Rfl: 3   Blood Pressure Monitoring (BLOOD PRESSURE CUFF) MISC, 1 Device by Does not apply route daily as needed., Disp: 1 each, Rfl: 0   calcium  carbonate (OS-CAL - DOSED IN MG OF ELEMENTAL CALCIUM ) 1250 (500 Ca) MG tablet, Take 1 tablet (1,250 mg total) by mouth daily with breakfast., Disp: 30 tablet, Rfl: 0   cetirizine  (ZYRTEC ) 10 MG tablet, Take 1 tablet (10 mg total) by mouth daily., Disp: 90 tablet, Rfl: 1   clindamycin (CLEOCIN) 300 MG capsule, Take 300 mg by mouth 3 (three) times daily., Disp: , Rfl:    clonazePAM  (KLONOPIN ) 1 MG tablet, Take 1 tablet (1 mg total)  by mouth 2 (two) times daily as needed for anxiety., Disp: 60 tablet, Rfl: 2   dupilumab  (DUPIXENT ) 300 MG/2ML prefilled syringe, Inject 300 mg into the skin every 14 (fourteen) days., Disp: 4 mL, Rfl: 11   EPINEPHrine  (EPIPEN  2-PAK) 0.3 mg/0.3 mL IJ SOAJ injection, Inject 0.3 mg into the muscle as needed for anaphylaxis., Disp: 2 each, Rfl: 2   famotidine  (PEPCID ) 20 MG tablet, Take 1 tablet (20 mg total) by mouth 2 (two) times daily., Disp: 180 tablet, Rfl: 1   ferrous sulfate  325 (65 FE) MG tablet, Take 1 tablet (325 mg total) by mouth daily., Disp: 30 tablet, Rfl: 0   FLUoxetine  (PROZAC ) 20 MG capsule, Take 1 capsule (20 mg total) by mouth daily., Disp: 90 capsule, Rfl: 3   fluticasone  (FLONASE ) 50 MCG/ACT nasal spray, Place 2 sprays into both nostrils daily., Disp: 48 mL, Rfl: 3   fluticasone -salmeterol (ADVAIR HFA) 115-21 MCG/ACT inhaler, Inhale 2 puffs into the lungs 2 (two) times daily., Disp: 1 each,  Rfl: 5   gabapentin  (NEURONTIN ) 300 MG capsule, Take 300 mg by mouth 3 (three) times daily., Disp: , Rfl:    hydrocortisone  1 % ointment, Apply 1 Application topically 2 (two) times daily. Apply to underarm, Disp: 30 g, Rfl: 0   montelukast  (SINGULAIR ) 10 MG tablet, Take 1 tablet (10 mg total) by mouth at bedtime., Disp: 90 tablet, Rfl: 1   Multiple Vitamin (MULTIVITAMIN) tablet, Take 1 tablet by mouth daily. Women's Daily Vitamin, Disp: , Rfl:    nicotine  (NICODERM CQ  - DOSED IN MG/24 HOURS) 14 mg/24hr patch, Place 1 patch (14 mg total) onto the skin daily., Disp: 28 patch, Rfl: 5   OLANZapine  zydis (ZYPREXA ) 5 MG disintegrating tablet, Take 1 tablet (5 mg total) by mouth at bedtime. (Patient taking differently: Take 10 mg by mouth at bedtime.), Disp: 30 tablet, Rfl: 0   omeprazole  (PRILOSEC) 40 MG capsule, Take 1 capsule (40 mg total) by mouth in the morning and at bedtime., Disp: 180 capsule, Rfl: 1   ondansetron  (ZOFRAN -ODT) 8 MG disintegrating tablet, Take 1 tablet (8 mg total) by mouth  every 8 (eight) hours as needed for nausea or vomiting., Disp: 20 tablet, Rfl: 0   OXcarbazepine  (TRILEPTAL ) 300 MG tablet, Take 1 tablet (300 mg total) by mouth 2 (two) times daily., Disp: 60 tablet, Rfl: 1   Potassium 99 MG TABS, Take 99 mg by mouth daily., Disp: , Rfl:    Respiratory Therapy Supplies (NEBULIZER/TUBING/MOUTHPIECE) KIT, Inhale 1 vial via nebulizer every 4-6 hours as needed for cough, wheeze, shortness of breath and chest tighness., Disp: 1 kit, Rfl: 0   thiamine  (VITAMIN B1) 100 MG tablet, Take 100 mg by mouth daily., Disp: , Rfl:    Tiotropium Bromide Monohydrate  (SPIRIVA  RESPIMAT) 1.25 MCG/ACT AERS, Inhale 2 puffs into the lungs daily., Disp: 12 g, Rfl: 1   Zinc  50 MG TABS, Take 50 mg by mouth daily., Disp: , Rfl:   Observations/Objective: Patient is well-developed, well-nourished in no acute distress.  Resting comfortably at home.  Head is normocephalic, atraumatic.  No labored breathing.  Speech is clear and coherent with logical content.  Patient is alert and oriented at baseline.   Assessment and Plan: 1. Gastroenteritis (Primary) - azithromycin  (ZITHROMAX ) 500 MG tablet; Take 1 tablet (500 mg total) by mouth daily for 3 days.  Dispense: 3 tablet; Refill: 0 - promethazine  (PHENERGAN ) 25 MG tablet; Take 1 tablet (25 mg total) by mouth every 8 (eight) hours as needed for nausea or vomiting.  Dispense: 20 tablet; Refill: 0  Supportive measures and OTC medications reviewed. Concern for possible bacterial etiology giving substantial symptoms and history. Start Azithromycin  x 3 days. Supportive measures reviewed. Refill phenergan  for nausea.  Strict ER precautions reviewed.  Follow Up Instructions: I discussed the assessment and treatment plan with the patient. The patient was provided an opportunity to ask questions and all were answered. The patient agreed with the plan and demonstrated an understanding of the instructions.  A copy of instructions were sent to the patient  via MyChart unless otherwise noted below.   The patient was advised to call back or seek an in-person evaluation if the symptoms worsen or if the condition fails to improve as anticipated.    Beverly Velma Lunger, PA-C

## 2023-11-04 NOTE — Patient Instructions (Signed)
 [name], thank you for joining Beverly Velma Lunger, PA-C for today's virtual visit.  While this provider is not your primary care provider (PCP), if your PCP is located in our provider database this encounter information will be shared with them immediately following your visit.   A [facility] MyChart account gives you access to today's visit and all your visits, tests, and labs performed at [facility]  click here if you don't have a [facility] MyChart account or go to [email or web address]  Consent: (Patient) [name] provided verbal consent for this virtual visit at the beginning of the encounter.  Current Medications:  Current Outpatient Medications:    albuterol  (PROVENTIL ) (2.5 MG/3ML) 0.083% nebulizer solution, Inhale 1 vial (2.5 mg) via nebulizer every 4-6 as needed for cough, wheeze, shortness of breath and chest tightness., Disp: 75 mL, Rfl: 2   albuterol  (VENTOLIN  HFA) 108 (90 Base) MCG/ACT inhaler, Inhale 2 puffs into the lungs every 4 (four) hours as needed for wheezing or shortness of breath., Disp: 18 g, Rfl: 1   amLODipine  (NORVASC ) 5 MG tablet, Take 1 tablet (5 mg total) by mouth daily., Disp: 90 tablet, Rfl: 3   Blood Pressure Monitoring (BLOOD PRESSURE CUFF) MISC, 1 Device by Does not apply route daily as needed., Disp: 1 each, Rfl: 0   calcium  carbonate (OS-CAL - DOSED IN MG OF ELEMENTAL CALCIUM ) 1250 (500 Ca) MG tablet, Take 1 tablet (1,250 mg total) by mouth daily with breakfast., Disp: 30 tablet, Rfl: 0   cetirizine  (ZYRTEC ) 10 MG tablet, Take 1 tablet (10 mg total) by mouth daily., Disp: 90 tablet, Rfl: 1   clindamycin (CLEOCIN) 300 MG capsule, Take 300 mg by mouth 3 (three) times daily., Disp: , Rfl:    clonazePAM  (KLONOPIN ) 1 MG tablet, Take 1 tablet (1 mg total) by mouth 2 (two) times daily as needed for anxiety., Disp: 60 tablet, Rfl: 2   dupilumab  (DUPIXENT ) 300 MG/2ML prefilled syringe, Inject 300 mg into the skin every 14 (fourteen)  days., Disp: 4 mL, Rfl: 11   EPINEPHrine  (EPIPEN  2-PAK) 0.3 mg/0.3 mL IJ SOAJ injection, Inject 0.3 mg into the muscle as needed for anaphylaxis., Disp: 2 each, Rfl: 2   famotidine  (PEPCID ) 20 MG tablet, Take 1 tablet (20 mg total) by mouth 2 (two) times daily., Disp: 180 tablet, Rfl: 1   ferrous sulfate  325 (65 FE) MG tablet, Take 1 tablet (325 mg total) by mouth daily., Disp: 30 tablet, Rfl: 0   fluconazole  (DIFLUCAN ) 150 MG tablet, Take 1 tablet (150 mg total) by mouth every 3 (three) days., Disp: 1 tablet, Rfl: 0   FLUoxetine  (PROZAC ) 20 MG capsule, Take 1 capsule (20 mg total) by mouth daily., Disp: 90 capsule, Rfl: 3   fluticasone  (FLONASE ) 50 MCG/ACT nasal spray, Place 2 sprays into both nostrils daily., Disp: 48 mL, Rfl: 3   fluticasone -salmeterol (ADVAIR HFA) 115-21 MCG/ACT inhaler, Inhale 2 puffs into the lungs 2 (two) times daily., Disp: 1 each, Rfl: 5   gabapentin  (NEURONTIN ) 300 MG capsule, Take 300 mg by mouth 3 (three) times daily., Disp: , Rfl:    hydrocortisone  1 % ointment, Apply 1 Application topically 2 (two) times daily. Apply to underarm, Disp: 30 g, Rfl: 0   hydrOXYzine  (ATARAX ) 50 MG tablet, Take 1 tablet (50 mg total) by mouth 2 (two) times daily as needed for anxiety., Disp: 60 tablet, Rfl: 1   montelukast  (SINGULAIR ) 10 MG tablet, Take 1 tablet (10 mg total) by mouth at bedtime.,  Disp: 90 tablet, Rfl: 1   Multiple Vitamin (MULTIVITAMIN) tablet, Take 1 tablet by mouth daily. Women's Daily Vitamin, Disp: , Rfl:    nicotine  (NICODERM CQ  - DOSED IN MG/24 HOURS) 14 mg/24hr patch, Place 1 patch (14 mg total) onto the skin daily., Disp: 28 patch, Rfl: 5   OLANZapine  zydis (ZYPREXA ) 5 MG disintegrating tablet, Take 1 tablet (5 mg total) by mouth at bedtime. (Patient taking differently: Take 10 mg by mouth at bedtime.), Disp: 30 tablet, Rfl: 0   omeprazole  (PRILOSEC) 40 MG capsule, Take 1 capsule (40 mg total) by mouth in the morning and at bedtime., Disp: 180 capsule, Rfl: 1    ondansetron  (ZOFRAN -ODT) 8 MG disintegrating tablet, Take 1 tablet (8 mg total) by mouth every 8 (eight) hours as needed for nausea or vomiting., Disp: 20 tablet, Rfl: 0   OXcarbazepine  (TRILEPTAL ) 300 MG tablet, Take 1 tablet (300 mg total) by mouth 2 (two) times daily., Disp: 60 tablet, Rfl: 1   paliperidone  (INVEGA ) 6 MG 24 hr tablet, Take 1 tablet (6 mg total) by mouth daily., Disp: 30 tablet, Rfl: 1   Potassium 99 MG TABS, Take 99 mg by mouth daily., Disp: , Rfl:    prazosin  (MINIPRESS ) 1 MG capsule, Take 1 capsule (1 mg total) by mouth as directed. TAKE ONE IN THE AM AND 3 AT NIGHT, Disp: 120 capsule, Rfl: 0   promethazine  (PHENERGAN ) 25 MG tablet, Take 1 tablet (25 mg total) by mouth every 6 (six) hours as needed for nausea or vomiting., Disp: 30 tablet, Rfl: 0   Respiratory Therapy Supplies (NEBULIZER/TUBING/MOUTHPIECE) KIT, Inhale 1 vial via nebulizer every 4-6 hours as needed for cough, wheeze, shortness of breath and chest tighness., Disp: 1 kit, Rfl: 0   thiamine  (VITAMIN B1) 100 MG tablet, Take 100 mg by mouth daily., Disp: , Rfl:    Tiotropium Bromide Monohydrate  (SPIRIVA  RESPIMAT) 1.25 MCG/ACT AERS, Inhale 2 puffs into the lungs daily., Disp: 12 g, Rfl: 1   traZODone  (DESYREL ) 50 MG tablet, Take 0.5 tablets (25 mg total) by mouth at bedtime as needed for sleep., Disp: 15 tablet, Rfl: 1   Zinc  50 MG TABS, Take 50 mg by mouth daily., Disp: , Rfl:    Medications ordered in this encounter:  No orders of the defined types were placed in this encounter.    *If you need refills on other medications prior to your next appointment, please contact your pharmacy*  Follow-Up: Call back or seek an in-person evaluation if the symptoms worsen or if the condition fails to improve as anticipated.  [facility] [phone]  Other Instructions Hydrate and rest. I have sent in a refill of your nausea medicine. I have also sent in a short course of Azithromycin  to take for  suspected bacterial cause of symptoms. Keep a bland diet. If you note any non-resolving, new, or worsening symptoms despite treatment, please seek an in-person evaluation ASAP.    Food Poisoning Food poisoning is caused by eating or drinking foods or drinks that are spoiled. In most cases, food poisoning is mild and lasts 2-7 days. However, some cases can be serious, especially for people who have a weak body defense system (immune system), like older people, children and babies, and pregnant women. What are the causes? This condition is caused by eating foods that contain viruses, bacteria, parasites, or mold. This can happen by: Not washing your hands well enough or often enough. Not storing food properly. Preparing, serving, and storing  food on surfaces that are not clean. Cooking or eating with utensils that are not clean. Not cooking food to the right temperature. Viruses, bacteria, or parasites can hurt the intestine. This often causes very bad watery poop (diarrhea). The most common causes of this condition are: Viruses, such as: Norovirus. Rotavirus. Bacteria, such as: Salmonella. Listeria. E. coli (Escherichia coli). Parasites, such as: Giardia. Toxoplasma gondii. What are the signs or symptoms? Feeling like you may vomit (nauseous). Vomiting. Cramping or belly pain. Watery poop. Fever. Chills. Muscle aches. Not having enough water in the body (dehydration). How is this treated? Making sure that you get enough to drink. Taking medicines. In very bad cases, you may need: To be treated in a hospital. To get fluids through an IV tube. Follow these instructions at home: Eating and drinking  Drink enough fluids to keep your pee (urine) pale yellow. You may need to drink small amounts of clear liquids often. Avoid: Milk. Caffeine. Alcohol. Ask your doctor how you should get enough fluid in your body. Eat small meals often instead of eating large  meals. Medicines Take over-the-counter and prescription medicines only as told by your doctor. Ask your doctor if you should keep taking any of your regular medicines. If you were prescribed an antibiotic medicine, take it as told by your doctor. Do not stop taking the antibiotic even if you start to feel better. General instructions  Wash your hands with soap and water for at least 20 seconds: Before you prepare food. After you go to the bathroom (use the toilet). Make sure that the people who live with you also wash their hands often. Rest at home until you feel better. Clean surfaces that you touch with a product that contains chlorine bleach. Keep all follow-up visits. How is this prevented? Before and after you handle raw foods: Wash your hands. Wash surfaces used to prepare the food. Wash utensils. Do not prepare or store raw meat in the same place you prepare or store fresh fruits and vegetables. Keep refrigerated foods colder than 74F (5C). Serve hot foods right away or keep them heated above 174F (60C). Store dry foods in cool, dry spaces. Keep them away from too much heat or moisture. Throw out any foods that: Do not smell right. Are in cans that are bulging. Heat canned foods fully before you taste them. Drink bottled or germ-free (sterile) water when you travel. Get help right away if: You have trouble: Breathing. Swallowing. Talking. Moving. You have blurry vision. You faint. The whites of your eyes turn yellow (jaundice). You cannot eat or drink without vomiting. You continue to vomit or have watery poop. You start to have pain in your belly, your belly pain gets worse, or your belly pain stays in one small area. You have a fever. You have blood or mucus in your poop (stools), or your poop looks dark black and tarry. You have signs of not having enough water in your body, such as: Dark pee, very little pee, or no pee. Not making tears while crying. Dry  mouth or lips. Sunken eyes. Being sleepy. Feeling weak. Being dizzy. These symptoms may be an emergency. Get help right away. Call 911. Do not wait to see if the symptoms will go away. Do not drive yourself to the hospital. Summary Food poisoning is an illness that is caused by eating or drinking spoiled foods or drinks. Symptoms may include feeling like you may vomit, vomiting, and having watery poop. In most cases,  the illness will get better on its own in 2-7 days. In very bad cases, you may need to stay at the hospital. This information is not intended to replace advice given to you by your health care provider. Make sure you discuss any questions you have with your health care provider. Document Revised: 12/09/2020 Document Reviewed: 12/09/2020 Elsevier Patient Education  2024 Elsevier Inc.   If you have been instructed to have an in-person evaluation today at a local Urgent Care facility, please use the link below. It will take you to a list of all of our available [facility], including address, phone number and hours of operation. Please do not delay care.  [facility]  If you or a family member do not have a primary care provider, use the link below to schedule a visit and establish care. When you choose a [facility] primary care physician or advanced practice provider, you gain a long-term partner in health. Find a Primary Care Provider  Learn more about [facility] in-office and virtual care options: [facility] - Get Care Now

## 2023-11-07 LAB — LAB REPORT - SCANNED

## 2023-11-25 NOTE — Telephone Encounter (Signed)
 ERROR

## 2023-12-07 ENCOUNTER — Other Ambulatory Visit: Payer: Self-pay | Admitting: Pharmacy Technician

## 2023-12-07 ENCOUNTER — Other Ambulatory Visit: Payer: Self-pay

## 2023-12-07 LAB — LAB REPORT - SCANNED

## 2023-12-07 NOTE — Progress Notes (Signed)
 Disenrolled; Last filled 03/15/2023

## 2023-12-11 ENCOUNTER — Other Ambulatory Visit: Payer: Self-pay | Admitting: Allergy & Immunology

## 2023-12-13 ENCOUNTER — Telehealth: Payer: Self-pay | Admitting: *Deleted

## 2023-12-13 NOTE — Telephone Encounter (Signed)
 Received refill request for Advair HFA 115-21. In last OV note states she is on Breztri . Please advise.

## 2023-12-14 NOTE — Telephone Encounter (Signed)
 I would prefer that she be on Breztri  unless she was not tolerating it?   Marty Shaggy, MD [facility] 

## 2023-12-26 NOTE — Progress Notes (Signed)
   [address] [place] [address] Dept: [phone]  FOLLOW UP NOTE  [name], female    DOB: 08-Aug-1985  Age: 38 y.o. MRN: [id number] Date of Office Visit: 12/27/2023  Assessment  Chief Complaint: No chief complaint on file.  HPI [name] is a 38 year old female who presents to the clinic for a follow up visit. She was last seen in this clinic on 06/24/2023 by Dr. Iva for evaluation of asthma, allergic rhinitis, atopic dermatitis, recurrent infections, and reflux. Current problen list indicates borderline personality.  Her last environmental allergy  testing on 07/01/2017 was positive to pollens, dust mites, cat, dog, cockroach, mouse, and horse.   Discussed the use of AI scribe software for clinical note transcription with the patient, who gave verbal consent to proceed.  History of Present Illness      Drug Allergies:  Allergies  Allergen Reactions   Benztropine Other (See Comments)    agitation     Betadine [Povidone Iodine] Swelling   Other Other (See Comments)    Opiates- history of dependency    Lamictal [Lamotrigine] Rash    Physical Exam: There were no vitals taken for this visit.   Physical Exam  Diagnostics:    Assessment and Plan: No diagnosis found.  No orders of the defined types were placed in this encounter.   There are no Patient Instructions on file for this visit.  No follow-ups on file.    Thank you for the opportunity to care for this patient.  Please do not hesitate to contact me with questions.  Arlean Mutter, FNP [facility]

## 2023-12-26 NOTE — Patient Instructions (Incomplete)
 Asthma Continue montelukast  10 mg once a day to prevent cough and wheeze Continue Breztri  2 puffs twice a day with a spacer to prevent cough or wheeze Continue albuterol  2 puffs once every 4 hours if needed for cough or wheeze You may use albuterol  2 puffs 5-15 minutes before activity to decrease cough or wheeze   Allergic rhinitis Continue allergen avoidance measures directed toward pollen, dust mites, cat, dog, cockroach, mouse, or horse as listed below Continue hydroxyzine  twice a day Consider saline nasal rinses as needed for nasal symptoms. Use this before any medicated nasal sprays for best result  Atopic dermatitis Continue a twice a day moisturizing routine Continue hydrocortisone  1% to red and itchy areas up to twice a day if needed  Reflux Continue dietary and lifestyle modifications as lsited below Continue omeprazole  40 mg twice a day and famotidine  20 mg twice a day   Recurrent infection Continue to keep track of infections, antibiotics, and steroids Get the PPSV23 or Prevnar 20  vaccine. Ler us  know when you receive the vaccine and we wil recheck titers 4-6 weeks later  Call the clinic if this treatment plan is not working well for you  Follow up in *** or sooner if needed.  Reducing Pollen Exposure The American Academy of Allergy , Asthma and Immunology suggests the following steps to reduce your exposure to pollen during allergy  seasons. Do not hang sheets or clothing out to dry; pollen may collect on these items. Do not mow lawns or spend time around freshly cut grass; mowing stirs up pollen. Keep windows closed at night.  Keep car windows closed while driving. Minimize morning activities outdoors, a time when pollen counts are usually at their highest. Stay indoors as much as possible when pollen counts or humidity is high and on windy days when pollen tends to remain in the air longer. Use air conditioning when possible.  Many air conditioners have filters that trap  the pollen spores. Use a HEPA room air filter to remove pollen form the indoor air you breathe.   Control of Dust Mite Allergen Dust mites play a major role in allergic asthma and rhinitis. They occur in environments with high humidity wherever human skin is found. Dust mites absorb humidity from the atmosphere (ie, they do not drink) and feed on organic matter (including shed human and animal skin). Dust mites are a microscopic type of insect that you cannot see with the naked eye. High levels of dust mites have been detected from mattresses, pillows, carpets, upholstered furniture, bed covers, clothes, soft toys and any woven material. The principal allergen of the dust mite is found in its feces. A gram of dust may contain 1,000 mites and 250,000 fecal particles. Mite antigen is easily measured in the air during house cleaning activities. Dust mites do not bite and do not cause harm to humans, other than by triggering allergies/asthma.  Ways to decrease your exposure to dust mites in your home:  1. Encase mattresses, box springs and pillows with a mite-impermeable barrier or cover  2. Wash sheets, blankets and drapes weekly in hot water (130 F) with detergent and dry them in a dryer on the hot setting.  3. Have the room cleaned frequently with a vacuum cleaner and a damp dust-mop. For carpeting or rugs, vacuuming with a vacuum cleaner equipped with a high-efficiency particulate air (HEPA) filter. The dust mite allergic individual should not be in a room which is being cleaned and should wait 1 hour after cleaning  before going into the room.  4. Do not sleep on upholstered furniture (eg, couches).  5. If possible removing carpeting, upholstered furniture and drapery from the home is ideal. Horizontal blinds should be eliminated in the rooms where the person spends the most time (bedroom, study, television room). Washable vinyl, roller-type shades are optimal.  6. Remove all non-washable stuffed  toys from the bedroom. Wash stuffed toys weekly like sheets and blankets above.  7. Reduce indoor humidity to less than 50%. Inexpensive humidity monitors can be purchased at most hardware stores. Do not use a humidifier as can make the problem worse and are not recommended.  Control of Dog or Cat Allergen Avoidance is the best way to manage a dog or cat allergy . If you have a dog or cat and are allergic to dog or cats, consider removing the dog or cat from the home. If you have a dog or cat but don't want to find it a new home, or if your family wants a pet even though someone in the household is allergic, here are some strategies that may help keep symptoms at bay:  Keep the pet out of your bedroom and restrict it to only a few rooms. Be advised that keeping the dog or cat in only one room will not limit the allergens to that room. Don't pet, hug or kiss the dog or cat; if you do, wash your hands with soap and water. High-efficiency particulate air (HEPA) cleaners run continuously in a bedroom or living room can reduce allergen levels over time. Regular use of a high-efficiency vacuum cleaner or a central vacuum can reduce allergen levels. Giving your dog or cat a bath at least once a week can reduce airborne allergen.  Control of Cockroach Allergen Cockroach allergen has been identified as an important cause of acute attacks of asthma, especially in urban settings.  There are fifty-five species of cockroach that exist in the [place] , however only three, the American, German and Oriental species produce allergen that can affect patients with Asthma.  Allergens can be obtained from fecal particles, egg casings and secretions from cockroaches.    Remove food sources. Reduce access to water. Seal access and entry points. Spray runways with 0.5-1% Diazinon or Chlorpyrifos Blow boric acid power under stoves and refrigerator. Place bait stations (hydramethylnon) at feeding sites.

## 2023-12-27 ENCOUNTER — Other Ambulatory Visit: Payer: Self-pay

## 2023-12-27 ENCOUNTER — Ambulatory Visit: Admitting: Family Medicine

## 2023-12-27 ENCOUNTER — Encounter: Payer: Self-pay | Admitting: Family Medicine

## 2023-12-27 VITALS — BP 110/64 | HR 85 | Temp 98.5°F | Resp 20 | Wt 276.4 lb

## 2023-12-27 DIAGNOSIS — L2084 Intrinsic (allergic) eczema: Secondary | ICD-10-CM

## 2023-12-27 DIAGNOSIS — J3089 Other allergic rhinitis: Secondary | ICD-10-CM | POA: Diagnosis not present

## 2023-12-27 DIAGNOSIS — J45909 Unspecified asthma, uncomplicated: Secondary | ICD-10-CM

## 2023-12-27 DIAGNOSIS — J45998 Other asthma: Secondary | ICD-10-CM | POA: Diagnosis not present

## 2023-12-27 DIAGNOSIS — B999 Unspecified infectious disease: Secondary | ICD-10-CM | POA: Diagnosis not present

## 2023-12-27 DIAGNOSIS — J302 Other seasonal allergic rhinitis: Secondary | ICD-10-CM | POA: Insufficient documentation

## 2023-12-27 DIAGNOSIS — K219 Gastro-esophageal reflux disease without esophagitis: Secondary | ICD-10-CM | POA: Diagnosis not present

## 2023-12-27 MED ORDER — MONTELUKAST SODIUM 10 MG PO TABS: 10.0000 mg | ORAL_TABLET | Freq: Every day | ORAL | 1 refills | Status: AC

## 2023-12-27 MED ORDER — SPIRIVA RESPIMAT 1.25 MCG/ACT IN AERS: 2.0000 | INHALATION_SPRAY | Freq: Every day | RESPIRATORY_TRACT | 1 refills | Status: AC

## 2023-12-27 MED ORDER — ALBUTEROL SULFATE (2.5 MG/3ML) 0.083% IN NEBU: INHALATION_SOLUTION | RESPIRATORY_TRACT | 2 refills | Status: AC

## 2023-12-27 MED ORDER — FLUTICASONE-SALMETEROL 115-21 MCG/ACT IN AERO: 2.0000 | INHALATION_SPRAY | Freq: Two times a day (BID) | RESPIRATORY_TRACT | 5 refills | Status: AC

## 2023-12-27 MED ORDER — CETIRIZINE HCL 10 MG PO TABS: 10.0000 mg | ORAL_TABLET | Freq: Every day | ORAL | 1 refills | Status: AC

## 2023-12-28 ENCOUNTER — Other Ambulatory Visit: Payer: Self-pay | Admitting: Allergy & Immunology

## 2023-12-28 ENCOUNTER — Ambulatory Visit: Admitting: Family Medicine

## 2023-12-29 ENCOUNTER — Telehealth: Payer: Self-pay | Admitting: *Deleted

## 2023-12-29 NOTE — Telephone Encounter (Signed)
-----   Message from Arlean Mutter sent at 12/27/2023  3:40 PM EST ----- Hi there Bree Heinzelman, Can you please submit this patient for Dupixent  for asthma control. She has previously used Dupixent . Her last AEC on 12/07/2023 was 400. Thank you

## 2023-12-29 NOTE — Telephone Encounter (Signed)
 L/m for patient to contact me with her [employer] PDP info in order to get approval to restart dupixent 

## 2024-01-03 MED ORDER — DUPIXENT 300 MG/2ML ~~LOC~~ SOSY
300.0000 mg | PREFILLED_SYRINGE | SUBCUTANEOUS | 11 refills | Status: AC
  Filled 2024-01-20: qty 4, 28d supply, fill #0

## 2024-01-03 MED ORDER — DUPIXENT 300 MG/2ML ~~LOC~~ SOSY
600.0000 mg | PREFILLED_SYRINGE | Freq: Once | SUBCUTANEOUS | 0 refills | Status: AC
  Filled 2024-01-20: qty 4, 1d supply, fill #0

## 2024-01-03 NOTE — Telephone Encounter (Signed)
 Called patient and advised approval and submit to restart Dupixent  including starting with loading dose then 300mg  every 14 days to [facility]

## 2024-01-05 ENCOUNTER — Other Ambulatory Visit: Payer: Self-pay

## 2024-01-05 ENCOUNTER — Other Ambulatory Visit (HOSPITAL_COMMUNITY): Payer: Self-pay

## 2024-01-07 ENCOUNTER — Other Ambulatory Visit: Payer: Self-pay

## 2024-01-07 NOTE — Progress Notes (Signed)
 Specialty Pharmacy Initiation Note   [name] is a 38 y.o. female who will be followed by the specialty pharmacy service for RxSp Asthma/COPD    Review of administration, indication, effectiveness, safety, potential side effects, storage/disposable, and missed dose instructions occurred today for patient's specialty medication(s) Dupilumab  (Dupixent )     Patient/Caregiver did not have any additional questions or concerns.   Patient's therapy is appropriate to: Initiate    Goals Addressed             This Visit's Progress    Reduce disease symptoms including coughing and shortness of breath       Patient is re-initiating therapy. Patient will maintain adherence         Delon CHRISTELLA [facility] Specialty Pharmacist

## 2024-01-11 ENCOUNTER — Ambulatory Visit: Admitting: Internal Medicine

## 2024-01-12 ENCOUNTER — Ambulatory Visit: Admitting: Family Medicine

## 2024-01-20 ENCOUNTER — Other Ambulatory Visit: Payer: Self-pay

## 2024-01-20 NOTE — Progress Notes (Signed)
 Specialty Pharmacy Initial Fill Coordination Note  [name] is a 38 y.o. female contacted today regarding initial fill of specialty medication(s) Dupilumab  (Dupixent )   Patient requested Delivery   Delivery date: 01/25/24   Verified address: [address] [place], [place] [address]   Medication will be filled on: 01/24/24   Patient is aware of $4.80 copayment.

## 2024-02-07 ENCOUNTER — Telehealth

## 2024-02-07 DIAGNOSIS — B9689 Other specified bacterial agents as the cause of diseases classified elsewhere: Secondary | ICD-10-CM

## 2024-02-07 DIAGNOSIS — J208 Acute bronchitis due to other specified organisms: Secondary | ICD-10-CM

## 2024-02-07 MED ORDER — AMOXICILLIN-POT CLAVULANATE 875-125 MG PO TABS: 1.0000 | ORAL_TABLET | Freq: Two times a day (BID) | ORAL | 0 refills | Status: AC

## 2024-02-07 MED ORDER — PREDNISONE 20 MG PO TABS: 40.0000 mg | ORAL_TABLET | Freq: Every day | ORAL | 0 refills | Status: AC

## 2024-02-07 MED ORDER — ALBUTEROL SULFATE HFA 108 (90 BASE) MCG/ACT IN AERS: 2.0000 | INHALATION_SPRAY | Freq: Four times a day (QID) | RESPIRATORY_TRACT | 0 refills | Status: AC | PRN

## 2024-02-07 NOTE — Patient Instructions (Signed)
 " [name], thank you for joining Beverly CHRISTELLA Belt, NP for today's virtual visit.  While this provider is not your primary care provider (PCP), if your PCP is located in our provider database this encounter information will be shared with them immediately following your visit.   A [facility] MyChart account gives you access to today's visit and all your visits, tests, and labs performed at [facility]  click here if you don't have a  MyChart account or go to [email or web address]  Consent: (Patient) [name] provided verbal consent for this virtual visit at the beginning of the encounter.  Current Medications:  Current Outpatient Medications:    albuterol  (VENTOLIN  HFA) 108 (90 Base) MCG/ACT inhaler, Inhale 2 puffs into the lungs every 6 (six) hours as needed for wheezing or shortness of breath., Disp: 8 g, Rfl: 0   amoxicillin -clavulanate (AUGMENTIN ) 875-125 MG tablet, Take 1 tablet by mouth 2 (two) times daily for 5 days., Disp: 10 tablet, Rfl: 0   predniSONE  (DELTASONE ) 20 MG tablet, Take 2 tablets (40 mg total) by mouth daily with breakfast for 5 days., Disp: 10 tablet, Rfl: 0   albuterol  (PROVENTIL ) (2.5 MG/3ML) 0.083% nebulizer solution, Inhale 1 vial (2.5 mg) via nebulizer every 4-6 as needed for cough, wheeze, shortness of breath and chest tightness., Disp: 75 mL, Rfl: 2   amLODipine  (NORVASC ) 5 MG tablet, Take 1 tablet (5 mg total) by mouth daily. (Patient not taking: Reported on 12/27/2023), Disp: 90 tablet, Rfl: 3   Blood Pressure Monitoring (BLOOD PRESSURE CUFF) MISC, 1 Device by Does not apply route daily as needed., Disp: 1 each, Rfl: 0   calcium  carbonate (OS-CAL - DOSED IN MG OF ELEMENTAL CALCIUM ) 1250 (500 Ca) MG tablet, Take 1 tablet (1,250 mg total) by mouth daily with breakfast., Disp: 30 tablet, Rfl: 0   cetirizine  (ZYRTEC ) 10 MG tablet, Take 1 tablet (10 mg total) by mouth daily., Disp: 90 tablet, Rfl: 1   clindamycin (CLEOCIN) 300 MG  capsule, Take 300 mg by mouth 3 (three) times daily., Disp: , Rfl:    clonazePAM  (KLONOPIN ) 1 MG tablet, Take 1 tablet (1 mg total) by mouth 2 (two) times daily as needed for anxiety., Disp: 60 tablet, Rfl: 2   dupilumab  (DUPIXENT ) 300 MG/2ML prefilled syringe, Inject 300 mg into the skin every 14 (fourteen) days., Disp: 4 mL, Rfl: 11   EPINEPHrine  (EPIPEN  2-PAK) 0.3 mg/0.3 mL IJ SOAJ injection, Inject 0.3 mg into the muscle as needed for anaphylaxis., Disp: 2 each, Rfl: 2   famotidine  (PEPCID ) 20 MG tablet, Take 1 tablet (20 mg total) by mouth 2 (two) times daily., Disp: 180 tablet, Rfl: 1   ferrous sulfate  325 (65 FE) MG tablet, Take 1 tablet (325 mg total) by mouth daily., Disp: 30 tablet, Rfl: 0   FLUoxetine  (PROZAC ) 20 MG capsule, Take 1 capsule (20 mg total) by mouth daily., Disp: 90 capsule, Rfl: 3   fluticasone  (FLONASE ) 50 MCG/ACT nasal spray, Place 2 sprays into both nostrils daily., Disp: 48 mL, Rfl: 3   fluticasone -salmeterol (ADVAIR HFA) 115-21 MCG/ACT inhaler, Inhale 2 puffs into the lungs 2 (two) times daily., Disp: 1 each, Rfl: 5   gabapentin  (NEURONTIN ) 300 MG capsule, Take 300 mg by mouth 3 (three) times daily., Disp: , Rfl:    hydrocortisone  1 % ointment, Apply 1 Application topically 2 (two) times daily. Apply to underarm, Disp: 30 g, Rfl: 0   montelukast  (SINGULAIR ) 10 MG tablet, Take 1 tablet (10 mg  total) by mouth at bedtime., Disp: 90 tablet, Rfl: 1   Multiple Vitamin (MULTIVITAMIN) tablet, Take 1 tablet by mouth daily. Women's Daily Vitamin, Disp: , Rfl:    nicotine  (NICODERM CQ  - DOSED IN MG/24 HOURS) 14 mg/24hr patch, Place 1 patch (14 mg total) onto the skin daily., Disp: 28 patch, Rfl: 5   OLANZapine  zydis (ZYPREXA ) 5 MG disintegrating tablet, Take 1 tablet (5 mg total) by mouth at bedtime. (Patient taking differently: Take 10 mg by mouth at bedtime.), Disp: 30 tablet, Rfl: 0   omeprazole  (PRILOSEC) 40 MG capsule, Take 1 capsule (40 mg total) by mouth in the morning and at  bedtime., Disp: 180 capsule, Rfl: 1   ondansetron  (ZOFRAN -ODT) 8 MG disintegrating tablet, Take 1 tablet (8 mg total) by mouth every 8 (eight) hours as needed for nausea or vomiting., Disp: 20 tablet, Rfl: 0   OXcarbazepine  (TRILEPTAL ) 300 MG tablet, Take 1 tablet (300 mg total) by mouth 2 (two) times daily., Disp: 60 tablet, Rfl: 1   Potassium 99 MG TABS, Take 99 mg by mouth daily., Disp: , Rfl:    promethazine  (PHENERGAN ) 25 MG tablet, Take 1 tablet (25 mg total) by mouth every 8 (eight) hours as needed for nausea or vomiting., Disp: 20 tablet, Rfl: 0   Respiratory Therapy Supplies (NEBULIZER/TUBING/MOUTHPIECE) KIT, Inhale 1 vial via nebulizer every 4-6 hours as needed for cough, wheeze, shortness of breath and chest tighness., Disp: 1 kit, Rfl: 0   thiamine  (VITAMIN B1) 100 MG tablet, Take 100 mg by mouth daily., Disp: , Rfl:    Tiotropium Bromide (SPIRIVA  RESPIMAT) 1.25 MCG/ACT AERS, Inhale 2 puffs into the lungs daily., Disp: 12 g, Rfl: 1   VENTOLIN  HFA 108 (90 Base) MCG/ACT inhaler, Inhale 2 puffs into the lungs every 4 (four) hours as needed for wheezing or shortness of breath., Disp: 18 g, Rfl: 1   Zinc  50 MG TABS, Take 50 mg by mouth daily., Disp: , Rfl:    Medications ordered in this encounter:  Meds ordered this encounter  Medications   albuterol  (VENTOLIN  HFA) 108 (90 Base) MCG/ACT inhaler    Sig: Inhale 2 puffs into the lungs every 6 (six) hours as needed for wheezing or shortness of breath.    Dispense:  8 g    Refill:  0   predniSONE  (DELTASONE ) 20 MG tablet    Sig: Take 2 tablets (40 mg total) by mouth daily with breakfast for 5 days.    Dispense:  10 tablet    Refill:  0   amoxicillin -clavulanate (AUGMENTIN ) 875-125 MG tablet    Sig: Take 1 tablet by mouth 2 (two) times daily for 5 days.    Dispense:  10 tablet    Refill:  0     *If you need refills on other medications prior to your next appointment, please contact your pharmacy*  Follow-Up: Call back or seek an  in-person evaluation if the symptoms worsen or if the condition fails to improve as anticipated.  [facility] [phone]  Other Instructions  Please follow up with your allergist and primary care provider.   If this treatment plan doesn't help you improve, or if you are getting worse, please get checked in person. For severe shortness of breath, unrelieved by albuterol , please call [phone].    If you have been instructed to have an in-person evaluation today at a local Urgent Care facility, please use the link below. It will take you to a list of all  of our available [facility], including address, phone number and hours of operation. Please do not delay care.  [facility]  If you or a family member do not have a primary care provider, use the link below to schedule a visit and establish care. When you choose a [facility] primary care physician or advanced practice provider, you gain a long-term partner in health. Find a Primary Care Provider  Learn more about [facility] in-office and virtual care options: [facility] - Get Care Now  "

## 2024-02-07 NOTE — Progress Notes (Signed)
 " Virtual Visit Consent   [name], you are scheduled for a virtual visit with a [facility] provider today. Just as with appointments in the office, your consent must be obtained to participate. Your consent will be active for this visit and any virtual visit you may have with one of our providers in the next 365 days. If you have a MyChart account, a copy of this consent can be sent to you electronically.  As this is a virtual visit, video technology does not allow for your provider to perform a traditional examination. This may limit your provider's ability to fully assess your condition. If your provider identifies any concerns that need to be evaluated in person or the need to arrange testing (such as labs, EKG, etc.), we will make arrangements to do so. Although advances in technology are sophisticated, we cannot ensure that it will always work on either your end or our end. If the connection with a video visit is poor, the visit may have to be switched to a telephone visit. With either a video or telephone visit, we are not always able to ensure that we have a secure connection.  By engaging in this virtual visit, you consent to the provision of healthcare and authorize for your insurance to be billed (if applicable) for the services provided during this visit. Depending on your insurance coverage, you may receive a charge related to this service.  I need to obtain your verbal consent now. Are you willing to proceed with your visit today? JAMESIA LINNEN has provided verbal consent on 02/07/2024 for a virtual visit (video or telephone). Jon CHRISTELLA Belt, NP  Date: 02/07/2024 2:30 PM   Virtual Visit via Video Note   I, Jon CHRISTELLA Belt, connected with  [name]  ([id number], 1985-12-18) on 02/07/2024 at  2:15 PM EST by a video-enabled telemedicine application and verified that I am speaking with the correct person using two identifiers.  Location: Patient: Virtual Visit Location Patient:  Home Provider: Virtual Visit Location Provider: Home Office   I discussed the limitations of evaluation and management by telemedicine and the availability of in person appointments. The patient expressed understanding and agreed to proceed.    History of Present Illness: [name] is a 39 y.o. who identifies as a female who was assigned female at birth, and is being seen today for possible pneumonia or bronchitis.   Sick with cold for last 4-5 days. Getting worse every day.   Has lots of post nasal drainage. Cough productive in the  mornig but dry over the course of the day. Hx asthma, sometimes gets bronchitis or pneumonia. Feels like she has pneumonia today.   Not sure if fever, has not taken temp. Has had hot and cold chills.   Using advair, spiriva  as prescribed. Using albuterol  prn to manage SOB. Does not feel she needs to go to ER.     HPI: HPI  Problems:  Patient Active Problem List   Diagnosis Date Noted   Seasonal and perennial allergic rhinitis 12/27/2023   Recurrent infections 12/27/2023   Gastroesophageal reflux disease 12/27/2023   Panic 11/14/2022   Abscess of multiple sites 11/09/2022   Smoker 03/20/2022   HTN (hypertension) 03/20/2022   Vaginitis 03/20/2022   Suicide ideation 02/18/2022   Substance abuse (HCC) 02/18/2022   Bipolar 1 disorder, manic, full remission 02/18/2022   Abscess of external cheek, left 01/01/2022   Rash 12/25/2021   Not well controlled asthma without  complication 11/26/2021   STD exposure 11/26/2021   Insect bite 11/26/2021   Bipolar affective disorder (HCC) 11/13/2021   Encounter for well adult exam with abnormal findings 11/12/2021   Cellulitis 11/12/2021   Vaginal discharge 08/26/2021   BRBPR (bright red blood per rectum) 02/27/2021   Anal fissure 02/27/2021   Facial abscess 02/12/2021   Folliculitis 12/21/2020   Bipolar 2 disorder, major depressive episode (HCC) 12/20/2020   Vitamin D  deficiency 07/15/2020   B12  deficiency 07/15/2020   MDD (major depressive disorder), recurrent severe, without psychosis (HCC) 04/26/2020   PTSD (post-traumatic stress disorder) 01/03/2020   Allergic asthma 01/13/2018   Allergic rhinitis 03/12/2017   Class 2 obesity due to excess calories without serious comorbidity with body mass index (BMI) of 37.0 to 37.9 in adult 04/17/2016   Acute asthmatic bronchitis 03/13/2016   Borderline personality disorder in adult [facility]) 03/13/2016   Intrinsic atopic dermatitis 03/13/2016   Mood disorder 03/04/2013    Allergies: Allergies[1] Medications: Current Medications[2]  Observations/Objective: Patient is well-developed, well-nourished in no acute distress.  Resting comfortably  at home.  Head is normocephalic, atraumatic.  No labored breathing.  Speech is clear and coherent with logical content.  Patient is alert and oriented at baseline.    Assessment and Plan: 1. Acute bacterial bronchitis (Primary) - albuterol  (VENTOLIN  HFA) 108 (90 Base) MCG/ACT inhaler; Inhale 2 puffs into the lungs every 6 (six) hours as needed for wheezing or shortness of breath.  Dispense: 8 g; Refill: 0 - predniSONE  (DELTASONE ) 20 MG tablet; Take 2 tablets (40 mg total) by mouth daily with breakfast for 5 days.  Dispense: 10 tablet; Refill: 0 - amoxicillin -clavulanate (AUGMENTIN ) 875-125 MG tablet; Take 1 tablet by mouth 2 (two) times daily for 5 days.  Dispense: 10 tablet; Refill: 0  I am not confident pt has bacterial infection but review of records shows poorly controlled asthma; looks like last bacterial lung infection was fall of 2024. Will treat as she requests. She will f/u with allergist.   Follow Up Instructions: I discussed the assessment and treatment plan with the patient. The patient was provided an opportunity to ask questions and all were answered. The patient agreed with the plan and demonstrated an understanding of the instructions.  A copy of instructions were sent to the patient via  MyChart unless otherwise noted below.    The patient was advised to call back or seek an in-person evaluation if the symptoms worsen or if the condition fails to improve as anticipated.    Jon CHRISTELLA Belt, NP    [1]  Allergies Allergen Reactions   Benztropine Other (See Comments)    agitation     Betadine [Povidone Iodine] Swelling   Other Other (See Comments)    Opiates- history of dependency    Lamictal [Lamotrigine] Rash  [2]  Current Outpatient Medications:    albuterol  (VENTOLIN  HFA) 108 (90 Base) MCG/ACT inhaler, Inhale 2 puffs into the lungs every 6 (six) hours as needed for wheezing or shortness of breath., Disp: 8 g, Rfl: 0   amoxicillin -clavulanate (AUGMENTIN ) 875-125 MG tablet, Take 1 tablet by mouth 2 (two) times daily for 5 days., Disp: 10 tablet, Rfl: 0   predniSONE  (DELTASONE ) 20 MG tablet, Take 2 tablets (40 mg total) by mouth daily with breakfast for 5 days., Disp: 10 tablet, Rfl: 0   albuterol  (PROVENTIL ) (2.5 MG/3ML) 0.083% nebulizer solution, Inhale 1 vial (2.5 mg) via nebulizer every 4-6 as needed for cough, wheeze, shortness of breath and chest  tightness., Disp: 75 mL, Rfl: 2   amLODipine  (NORVASC ) 5 MG tablet, Take 1 tablet (5 mg total) by mouth daily. (Patient not taking: Reported on 12/27/2023), Disp: 90 tablet, Rfl: 3   Blood Pressure Monitoring (BLOOD PRESSURE CUFF) MISC, 1 Device by Does not apply route daily as needed., Disp: 1 each, Rfl: 0   calcium  carbonate (OS-CAL - DOSED IN MG OF ELEMENTAL CALCIUM ) 1250 (500 Ca) MG tablet, Take 1 tablet (1,250 mg total) by mouth daily with breakfast., Disp: 30 tablet, Rfl: 0   cetirizine  (ZYRTEC ) 10 MG tablet, Take 1 tablet (10 mg total) by mouth daily., Disp: 90 tablet, Rfl: 1   clindamycin (CLEOCIN) 300 MG capsule, Take 300 mg by mouth 3 (three) times daily., Disp: , Rfl:    clonazePAM  (KLONOPIN ) 1 MG tablet, Take 1 tablet (1 mg total) by mouth 2 (two) times daily as needed for anxiety., Disp: 60 tablet, Rfl: 2    dupilumab  (DUPIXENT ) 300 MG/2ML prefilled syringe, Inject 300 mg into the skin every 14 (fourteen) days., Disp: 4 mL, Rfl: 11   EPINEPHrine  (EPIPEN  2-PAK) 0.3 mg/0.3 mL IJ SOAJ injection, Inject 0.3 mg into the muscle as needed for anaphylaxis., Disp: 2 each, Rfl: 2   famotidine  (PEPCID ) 20 MG tablet, Take 1 tablet (20 mg total) by mouth 2 (two) times daily., Disp: 180 tablet, Rfl: 1   ferrous sulfate  325 (65 FE) MG tablet, Take 1 tablet (325 mg total) by mouth daily., Disp: 30 tablet, Rfl: 0   FLUoxetine  (PROZAC ) 20 MG capsule, Take 1 capsule (20 mg total) by mouth daily., Disp: 90 capsule, Rfl: 3   fluticasone  (FLONASE ) 50 MCG/ACT nasal spray, Place 2 sprays into both nostrils daily., Disp: 48 mL, Rfl: 3   fluticasone -salmeterol (ADVAIR HFA) 115-21 MCG/ACT inhaler, Inhale 2 puffs into the lungs 2 (two) times daily., Disp: 1 each, Rfl: 5   gabapentin  (NEURONTIN ) 300 MG capsule, Take 300 mg by mouth 3 (three) times daily., Disp: , Rfl:    hydrocortisone  1 % ointment, Apply 1 Application topically 2 (two) times daily. Apply to underarm, Disp: 30 g, Rfl: 0   montelukast  (SINGULAIR ) 10 MG tablet, Take 1 tablet (10 mg total) by mouth at bedtime., Disp: 90 tablet, Rfl: 1   Multiple Vitamin (MULTIVITAMIN) tablet, Take 1 tablet by mouth daily. Women's Daily Vitamin, Disp: , Rfl:    nicotine  (NICODERM CQ  - DOSED IN MG/24 HOURS) 14 mg/24hr patch, Place 1 patch (14 mg total) onto the skin daily., Disp: 28 patch, Rfl: 5   OLANZapine  zydis (ZYPREXA ) 5 MG disintegrating tablet, Take 1 tablet (5 mg total) by mouth at bedtime. (Patient taking differently: Take 10 mg by mouth at bedtime.), Disp: 30 tablet, Rfl: 0   omeprazole  (PRILOSEC) 40 MG capsule, Take 1 capsule (40 mg total) by mouth in the morning and at bedtime., Disp: 180 capsule, Rfl: 1   ondansetron  (ZOFRAN -ODT) 8 MG disintegrating tablet, Take 1 tablet (8 mg total) by mouth every 8 (eight) hours as needed for nausea or vomiting., Disp: 20 tablet, Rfl: 0    OXcarbazepine  (TRILEPTAL ) 300 MG tablet, Take 1 tablet (300 mg total) by mouth 2 (two) times daily., Disp: 60 tablet, Rfl: 1   Potassium 99 MG TABS, Take 99 mg by mouth daily., Disp: , Rfl:    promethazine  (PHENERGAN ) 25 MG tablet, Take 1 tablet (25 mg total) by mouth every 8 (eight) hours as needed for nausea or vomiting., Disp: 20 tablet, Rfl: 0   Respiratory Therapy Supplies (NEBULIZER/TUBING/MOUTHPIECE) KIT,  Inhale 1 vial via nebulizer every 4-6 hours as needed for cough, wheeze, shortness of breath and chest tighness., Disp: 1 kit, Rfl: 0   thiamine  (VITAMIN B1) 100 MG tablet, Take 100 mg by mouth daily., Disp: , Rfl:    Tiotropium Bromide (SPIRIVA  RESPIMAT) 1.25 MCG/ACT AERS, Inhale 2 puffs into the lungs daily., Disp: 12 g, Rfl: 1   VENTOLIN  HFA 108 (90 Base) MCG/ACT inhaler, Inhale 2 puffs into the lungs every 4 (four) hours as needed for wheezing or shortness of breath., Disp: 18 g, Rfl: 1   Zinc  50 MG TABS, Take 50 mg by mouth daily., Disp: , Rfl:   "

## 2024-02-08 ENCOUNTER — Ambulatory Visit: Admitting: Dermatology

## 2024-02-09 ENCOUNTER — Telehealth

## 2024-02-29 ENCOUNTER — Other Ambulatory Visit: Payer: Self-pay | Admitting: Allergy & Immunology
